# Patient Record
Sex: Male | Born: 1940 | Race: Black or African American | Hispanic: No | Marital: Married | Smoking: Former smoker
Health system: Southern US, Community
[De-identification: ages and names within clinical notes are randomized; demographics above are authoritative.]

## PROBLEM LIST (undated history)

## (undated) ENCOUNTER — Emergency Department (HOSPITAL_COMMUNITY): Payer: No Typology Code available for payment source

## (undated) DIAGNOSIS — K219 Gastro-esophageal reflux disease without esophagitis: Secondary | ICD-10-CM

## (undated) DIAGNOSIS — N189 Chronic kidney disease, unspecified: Secondary | ICD-10-CM

## (undated) DIAGNOSIS — R0600 Dyspnea, unspecified: Secondary | ICD-10-CM

## (undated) DIAGNOSIS — Z716 Tobacco abuse counseling: Secondary | ICD-10-CM

## (undated) DIAGNOSIS — Z95811 Presence of heart assist device: Secondary | ICD-10-CM

## (undated) DIAGNOSIS — I2510 Atherosclerotic heart disease of native coronary artery without angina pectoris: Secondary | ICD-10-CM

## (undated) DIAGNOSIS — E669 Obesity, unspecified: Secondary | ICD-10-CM

## (undated) DIAGNOSIS — C61 Malignant neoplasm of prostate: Secondary | ICD-10-CM

## (undated) DIAGNOSIS — I10 Essential (primary) hypertension: Secondary | ICD-10-CM

## (undated) HISTORY — PX: INSERTION OF BRONCHIAL STENT: SHX5092

## (undated) HISTORY — PX: PENILE PROSTHESIS IMPLANT: SHX240

## (undated) HISTORY — DX: Obesity, unspecified: E66.9

## (undated) HISTORY — DX: Gastro-esophageal reflux disease without esophagitis: K21.9

## (undated) HISTORY — DX: Chronic kidney disease, unspecified: N18.9

## (undated) HISTORY — DX: Essential (primary) hypertension: I10

## (undated) HISTORY — DX: Atherosclerotic heart disease of native coronary artery without angina pectoris: I25.10

## (undated) HISTORY — DX: Tobacco abuse counseling: Z71.6

---

## 2002-02-08 ENCOUNTER — Ambulatory Visit (HOSPITAL_COMMUNITY): Admission: RE | Admit: 2002-02-08 | Discharge: 2002-02-08 | Payer: Self-pay | Admitting: Cardiovascular Disease

## 2002-02-08 HISTORY — PX: CARDIAC CATHETERIZATION: SHX172

## 2004-01-19 DIAGNOSIS — B182 Chronic viral hepatitis C: Secondary | ICD-10-CM | POA: Diagnosis present

## 2004-01-19 DIAGNOSIS — F1010 Alcohol abuse, uncomplicated: Secondary | ICD-10-CM | POA: Diagnosis present

## 2010-11-20 ENCOUNTER — Inpatient Hospital Stay (HOSPITAL_COMMUNITY)
Admission: EM | Admit: 2010-11-20 | Discharge: 2010-11-22 | DRG: 247 | Disposition: A | Discharge status: Home / Self Care | Payer: Medicare Other | Source: Ambulatory Visit | Attending: Cardiovascular Disease | Admitting: Cardiovascular Disease

## 2010-11-20 ENCOUNTER — Inpatient Hospital Stay (HOSPITAL_COMMUNITY): Payer: Medicare Other

## 2010-11-20 DIAGNOSIS — I447 Left bundle-branch block, unspecified: Secondary | ICD-10-CM | POA: Diagnosis present

## 2010-11-20 DIAGNOSIS — I2510 Atherosclerotic heart disease of native coronary artery without angina pectoris: Secondary | ICD-10-CM

## 2010-11-20 DIAGNOSIS — N289 Disorder of kidney and ureter, unspecified: Secondary | ICD-10-CM | POA: Diagnosis present

## 2010-11-20 DIAGNOSIS — M109 Gout, unspecified: Secondary | ICD-10-CM | POA: Diagnosis present

## 2010-11-20 DIAGNOSIS — E669 Obesity, unspecified: Secondary | ICD-10-CM | POA: Diagnosis present

## 2010-11-20 DIAGNOSIS — I10 Essential (primary) hypertension: Secondary | ICD-10-CM | POA: Diagnosis present

## 2010-11-20 DIAGNOSIS — E119 Type 2 diabetes mellitus without complications: Secondary | ICD-10-CM | POA: Diagnosis present

## 2010-11-20 DIAGNOSIS — K219 Gastro-esophageal reflux disease without esophagitis: Secondary | ICD-10-CM | POA: Diagnosis present

## 2010-11-20 DIAGNOSIS — F17200 Nicotine dependence, unspecified, uncomplicated: Secondary | ICD-10-CM | POA: Diagnosis present

## 2010-11-20 HISTORY — PX: CARDIAC CATHETERIZATION: SHX172

## 2010-11-20 LAB — COMPREHENSIVE METABOLIC PANEL
AST: 44 U/L — ABNORMAL HIGH (ref 0–37)
Albumin: 3.2 g/dL — ABNORMAL LOW (ref 3.5–5.2)
Alkaline Phosphatase: 51 U/L (ref 39–117)
Chloride: 106 mEq/L (ref 96–112)
Creatinine, Ser: 1.83 mg/dL — ABNORMAL HIGH (ref 0.4–1.5)
GFR calc Af Amer: 45 mL/min — ABNORMAL LOW (ref >60)
Potassium: 3.6 mEq/L (ref 3.5–5.1)
Total Bilirubin: 0.6 mg/dL (ref 0.3–1.2)
Total Protein: 7.2 g/dL (ref 6.0–8.3)

## 2010-11-20 LAB — DIFFERENTIAL
Basophils Relative: 0 % (ref 0–1)
Lymphs Abs: 1.2 10*3/uL (ref 0.7–4.0)
Monocytes Absolute: 0.4 10*3/uL (ref 0.1–1.0)
Monocytes Relative: 9 % (ref 3–12)
Neutro Abs: 2.5 10*3/uL (ref 1.7–7.7)

## 2010-11-20 LAB — MRSA PCR SCREENING: MRSA by PCR: NEGATIVE

## 2010-11-20 LAB — CBC
Hemoglobin: 12.1 g/dL — ABNORMAL LOW (ref 13.0–17.0)
Hemoglobin: 12.2 g/dL — ABNORMAL LOW (ref 13.0–17.0)
MCH: 28.9 pg (ref 26.0–34.0)
MCH: 30 pg (ref 26.0–34.0)
MCHC: 34.9 g/dL (ref 30.0–36.0)
MCV: 84.4 fL (ref 78.0–100.0)
MCV: 86 fL (ref 78.0–100.0)
RBC: 4.18 MIL/uL — ABNORMAL LOW (ref 4.22–5.81)
RBC: 4.07 MIL/uL — ABNORMAL LOW (ref 4.22–5.81)

## 2010-11-20 LAB — BRAIN NATRIURETIC PEPTIDE: Pro B Natriuretic peptide (BNP): 45 pg/mL (ref 0.0–100.0)

## 2010-11-20 LAB — POCT I-STAT, CHEM 8
BUN: 16 mg/dL (ref 6–23)
Chloride: 102 mEq/L (ref 96–112)
Creatinine, Ser: 1.8 mg/dL — ABNORMAL HIGH (ref 0.4–1.5)
Glucose, Bld: 103 mg/dL — ABNORMAL HIGH (ref 70–99)
Potassium: 3.5 mEq/L (ref 3.5–5.1)

## 2010-11-20 LAB — GLUCOSE, CAPILLARY
Glucose-Capillary: 101 mg/dL — ABNORMAL HIGH (ref 70–99)
Glucose-Capillary: 64 mg/dL — ABNORMAL LOW (ref 70–99)

## 2010-11-20 LAB — CARDIAC PANEL(CRET KIN+CKTOT+MB+TROPI)
CK, MB: 5.1 ng/mL — ABNORMAL HIGH (ref 0.3–4.0)
Relative Index: 1.9 (ref 0.0–2.5)
Total CK: 239 U/L — ABNORMAL HIGH (ref 7–232)
Troponin I: 0.03 ng/mL (ref 0.00–0.06)

## 2010-11-20 LAB — PROTIME-INR: Prothrombin Time: 16.5 seconds — ABNORMAL HIGH (ref 11.6–15.2)

## 2010-11-21 DIAGNOSIS — R079 Chest pain, unspecified: Secondary | ICD-10-CM

## 2010-11-21 LAB — LIPID PANEL
Cholesterol: 170 mg/dL (ref 0–200)
LDL Cholesterol: 97 mg/dL (ref 0–99)
Total CHOL/HDL Ratio: 4.4 RATIO
VLDL: 34 mg/dL (ref 0–40)

## 2010-11-21 LAB — CARDIAC PANEL(CRET KIN+CKTOT+MB+TROPI)
CK, MB: 3.2 ng/mL (ref 0.3–4.0)
Total CK: 177 U/L (ref 7–232)
Troponin I: 0.02 ng/mL (ref 0.00–0.06)

## 2010-11-21 LAB — CBC
HCT: 34.6 % — ABNORMAL LOW (ref 39.0–52.0)
Hemoglobin: 12.1 g/dL — ABNORMAL LOW (ref 13.0–17.0)
MCH: 29.7 pg (ref 26.0–34.0)
MCHC: 35 g/dL (ref 30.0–36.0)
MCV: 85 fL (ref 78.0–100.0)
RBC: 4.07 MIL/uL — ABNORMAL LOW (ref 4.22–5.81)

## 2010-11-21 LAB — BASIC METABOLIC PANEL
CO2: 29 mEq/L (ref 19–32)
Calcium: 8.6 mg/dL (ref 8.4–10.5)
Chloride: 105 mEq/L (ref 96–112)
Creatinine, Ser: 1.23 mg/dL (ref 0.4–1.5)
GFR calc Af Amer: >60 mL/min (ref >60)
Glucose, Bld: 138 mg/dL — ABNORMAL HIGH (ref 70–99)

## 2010-11-21 LAB — TSH: TSH: 1.616 u[IU]/mL (ref 0.350–4.500)

## 2010-11-21 LAB — GLUCOSE, CAPILLARY: Glucose-Capillary: 132 mg/dL — ABNORMAL HIGH (ref 70–99)

## 2010-11-22 LAB — BASIC METABOLIC PANEL
BUN: 13 mg/dL (ref 6–23)
CO2: 27 mEq/L (ref 19–32)
Chloride: 106 mEq/L (ref 96–112)
Glucose, Bld: 95 mg/dL (ref 70–99)
Potassium: 3.6 mEq/L (ref 3.5–5.1)

## 2010-11-22 LAB — GLUCOSE, CAPILLARY: Glucose-Capillary: 125 mg/dL — ABNORMAL HIGH (ref 70–99)

## 2010-11-26 NOTE — Discharge Summary (Addendum)
[name], [name] NO.:  [id number]  MEDICAL RECORD NO.:  [id number]           PATIENT TYPE:  I  LOCATION:  [id number]                         FACILITY:  [facility]  PHYSICIAN:  Thayer Headings, M.D. DATE OF BIRTH:  1941-07-02  DATE OF ADMISSION:  11/20/2010 DATE OF DISCHARGE:  11/22/2010                              DISCHARGE SUMMARY   DISCHARGE DIAGNOSES: 1. Chest pain with new left bundle-branch block, initially admitted     as "Code STEMI" but ruled out for myocardial infarction. 2. Coronary artery disease.     a.     Moderate disease by cath in 2003 without intervention.     b.     New left bundle-branch block initially activated as code ST      elevation myocardial infarction, with negative cardiac enzymes and      cardiac cath on November 20, 2010, demonstrating severe mid left      anterior descending stenosis treated with percutaneous coronary      intervention under the guidance of IVUS with drug-eluting stent      placement.  Severe diffuse distal diagonal branch stenosis and      nonobstructive right coronary artery disease and left circumflex      stenosis. 3. Hypertension. 4. Type 2 non-insulin-dependent diabetes. 5. Acute renal insufficiency with creatinine of 1.8 on admission,     discharge creatinine 1.13. 6. Tobacco abuse, counseled. 7. Obesity. 8. Gout. 9. Gastroesophageal reflux disease.  HOSPITAL COURSE:  [name] is a 70 year old gentleman with a prior history of coronary artery disease in 2003, diabetes, hypertension, ongoing tobacco abuse who presented to the [facility] with a code STEMI.  While [facility] was at the senior center, he complained of chest pain, ongoing for 2 days.  A nurse called to EMS.  His EKG was abnormal and he was diagnosed with a left bundle-branch block, which was not previously known.  In the ER and on en route to the hospital, he received aspirin 81 mg x4 sublingual nitro and morphine; and on prior arrival to  the cath lab, he was still having 8/10 chest pain.  Cardiac catheterization was performed by Dr. Burt Knack, which demonstrated severe mid LAD stenosis treated with PCI under the guidance of IVUS.  Drug- eluting stent platform was used to reduce stenosis to 0%.  He had severe diffuse diagonal branch stenosis with nonobstructive RCA and left circumflex stenosis.  Postprocedurally, cardiac enzymes were drawn, which were negative x3.  His cholesterol did note his LDL was 97 with an HDL of 39 and triglycerides of 169.  His HCTZ was held on admission secondary to renal insufficiency, which also improved as above.  The patient was counseled in regards to his tobacco abuse.  His blood pressure was elevated on the day of discharge in the 150 range; and therefore, HCTZ was added back with supplemental potassium per Dr. Acie Fredrickson.  He has seen and examined him today and feels he is stable for discharge.  DISCHARGE LABS:  WBC 3.8, hemoglobin 12.1, hematocrit 34.6, and platelet count 149.  Sodium 138, potassium 3.6. chloride 106, CO2 27,  glucose 95, BUN 13, creatinine 1.13.  A1c is 6.0, total cholesterol 169, HDL 39, LDL 97, TSH 1.616.  STUDIES: 1. Chest x-ray on November 20, 2010, showed no evidence of acute     cardiopulmonary disease. 2. Cardiac catheterization on November 20, 2010, please see full     report for details as well as HPI for summary.  DISCHARGE MEDICATIONS: 1. Aspirin 81 mg daily. 2. Coreg 3.125 mg b.i.d. 3. Lipitor 40 mg at bedtime, in place of the Zocor. 4. Nitroglycerin sublingual 0.4 mg every 5 minutes as needed up to 3     doses. 5. Plavix 75 mg daily. 6. Potassium chloride 10 mEq daily. 7. Protonix 40 mg daily. 8. Hydrochlorothiazide 25 mg daily. 9. Acetaminophen 325 mg 2 tablets daily. 10.Allopurinol 100 mg daily. 11.Amlodipine 10 mg daily. 12.Glipizide 10 mg b.i.d. 13.Terazosin 5 mg daily.  DISPOSITION:  [name] will be discharged in stable condition to home. He is  not to lift anything or participate in sexual activity for 1 week. He is not to drive for 2 days.  He is to follow a low-sodium, heart- healthy diabetic diet and to call or return if he notices any pain, swelling, bleeding, or pus at his cath site.  He will follow up with Dr. Elmarie Shiley office on December 06, 2010, at 9:15 a.m. with Truitt Merle, nurse practitioner.  He will also have a BMET at that visit, given the reinitiation of HCTZ in the setting of his acute renal insufficiency on admission with potassium supplementation.  DURATION OF DISCHARGE ENCOUNTER:  Greater than 30 minutes including physician and PA time.     Melina Copa, P.A.C.   ______________________________ Thayer Headings, M.D.    DD/MEDQ  D:  11/22/2010  T:  11/22/2010  Job:  [id number]  cc:   Dr. Bunnie Philips at [facility].  Electronically Signed by Melina Copa  on 11/26/2010 05:14:30 PM Electronically Signed by Mertie Moores M.D. on 11/28/2010 07:03:17 AM

## 2010-11-29 ENCOUNTER — Ambulatory Visit (INDEPENDENT_AMBULATORY_CARE_PROVIDER_SITE_OTHER): Payer: Medicare Other | Admitting: Nurse Practitioner

## 2010-11-29 DIAGNOSIS — I10 Essential (primary) hypertension: Secondary | ICD-10-CM

## 2010-11-29 DIAGNOSIS — Z87891 Personal history of nicotine dependence: Secondary | ICD-10-CM

## 2010-11-29 DIAGNOSIS — I2510 Atherosclerotic heart disease of native coronary artery without angina pectoris: Secondary | ICD-10-CM

## 2010-11-29 NOTE — Procedures (Signed)
NAMEWING, Bernard NO.:  [id number]  MEDICAL RECORD NO.:  [id number]           PATIENT TYPE:  I  LOCATION:  2926                         FACILITY:  [facility]  PHYSICIAN:  Juanda Bond. Burt Knack, MD  DATE OF BIRTH:  1940-12-21  DATE OF PROCEDURE:  11/20/2010 DATE OF DISCHARGE:                           CARDIAC CATHETERIZATION   PROCEDURES: 1. Left heart catheterization. 2. Selective coronary angiography. 3. Left ventricular angiography. 4. Intravascular ultrasound and stenting of the mid-left anterior     descending artery.  PROCEDURAL INDICATIONS:  Mr. Gaugh is a 70 year old African American male with moderate coronary artery disease from a prior catheterization in 2003.  He presented from [facility] with chest pain and new left bundle-branch block.  A Code STEMI was activated from the field.  Upon arrival, the patient described 8/10 chest pain, but he appeared fairly comfortable in any event, with ongoing discomfort and left bundle branch block on EKG.  We proceeded emergently with cardiac cath and planned PCI if needed.  Risks and indications of the procedure were reviewed with the patient, emergency consent was obtained.  The right groin was prepped, draped and anesthetized with 1% lidocaine.  Using the modified Seldinger technique, a 6-French sheath was placed in the right femoral artery.  Standard Judkins catheters were used for coronary angiography and left ventriculography.  Following the diagnostic procedure, I elected to proceed with intravascular ultrasound of the LAD.  DIAGNOSTIC FINDINGS:  Aortic pressure 113/71 with a mean of 89, left ventricular pressure 114/15.  CORONARY ANGIOGRAPHY:  The right coronary artery is patent.  There is no high-grade obstructive disease.  There is mild plaque through the proximal vessel with 30-40% proximal-to-mid stenosis.  The distal vessel is patent with mild ectasia and luminal irregularities.  There is  an ectatic, diffusely diseased PDA branch.  The posterolateral branches of the RCA are small.  Left coronary artery:  The left mainstem has 25% ostial narrowing.  The left main is large and divides into the LAD and left circumflex.  There is no significant left main obstruction. LAD:  The LAD has moderate calcification.  There is a large first diagonal with severe distal disease in the 80-90% range, the coronary artery disease is in a diabetic pattern with distal vessel predominance. The second diagonal is small and it has high-grade stenosis of 80%.  The LAD has diffuse calcification.  There is a focal area of hypodensity in the mid-LAD at the origin of the second septal perforator.  There is scattered plaque throughout the remaining portions of the mid-LAD and then the distal LAD is of large caliber and wraps around the left ventricular apex.  The left circumflex is patent throughout its course gives off two obtuse marginal branches with no significant obstructive disease. Left ventriculography:  There is mild hypokinesis of the anterolateral wall.  The LVEF is estimated at 50-55%.  PCI NOTE:  There was focal hypodensity of the mid-LAD.  The diagonal branch has diffuse disease, but it is in the distal vessel pattern and I think it is best left from medical management.  I decided to proceed with  intravascular ultrasound of the LAD to assess the area of hypodensity as the patient has presented with symptoms typical of acute coronary syndrome.  Intravascular ultrasound was performed after a therapeutic ACT was achieved using IV bivalirudin.  The patient received study drug according to the [facility] trial.  Intravascular ultrasound showed diffuse plaque throughout the mid-LAD, but there was an area of focal high-grade stenosis that corresponded to the area of hypodensity.  There was also an appearance of a small tissue flap that was suspicious for area of plaque rupture.  There  was deep calcium, but minimal superficial calcium and I felt the vessel could be primarily stented.  A 3.0 x 20-mm Promus drug-eluting stent was chosen.  It was carefully positioned and deployed at 16 atmospheres.  The stent appeared well expanded.  The stent was postdilated with a 3.5 x 12-mm Dry Prong Quantum apex balloon which was taken to 16 atmospheres on two inflations.  There was an excellent angiographic result with no residual stenosis. Postprocedure IVUS was performed and demonstrated excellent stent expansion and an apposition throughout.  The patient tolerated the procedure well.  There were no immediate complications.  Of note, a venous sheath was placed at the beginning of the procedure as the patient had no good peripheral IV access.  Both the venous and arterial sheath will be pulled after the bivalirudin has been off for 2 hours.  FINAL ASSESSMENT: 1. Severe mid-left anterior descending artery stenosis, treated     successfully with percutaneous coronary intervention under the     guidance of intravascular ultrasound.  A drug-eluting stent     platform was used to reduce 80% stenosis to 0% with TIMI 3 flow     both pre and post. 2. Severe diffuse distal diagonal branch stenosis. 3. Nonobstructive right coronary artery and left circumflex stenosis.  RECOMMENDATIONS:  The patient should continue on dual antiplatelet therapy with aspirin and Plavix for minimum of 12 months.  He will be treated medically for his residual coronary artery disease.     Juanda Bond. Burt Knack, MD     MDC/MEDQ  D:  11/20/2010  T:  11/21/2010  Job:  [id number]  Electronically Signed by Sherren Mocha MD on 11/27/2010 09:11:07 PM

## 2010-11-29 NOTE — H&P (Signed)
[name], Bernard [name] NO.:  [id number]  MEDICAL RECORD NO.:  [id number]           PATIENT TYPE:  I  LOCATION:  2926                         FACILITY:  [facility]  PHYSICIAN:  Juanda Bond. Burt Knack, MD  DATE OF BIRTH:  1941-08-10  DATE OF ADMISSION:  11/20/2010 DATE OF DISCHARGE:                             HISTORY & PHYSICAL   PRIMARY CARE PHYSICIAN:  Dr. Bunnie Philips at [facility].  PRIMARY CARDIOLOGIST:  Thayer Headings, MD, in 2003.  CHIEF COMPLAINT:  STEMI/chest [name].  HISTORY OF PRESENT ILLNESS:  Bernard [name] is a 70 year old male with a history of nonobstructive coronary artery disease by remote cath.  Two days ago, Bernard [name] had onset of substernal chest [name].  Bernard [name] would get some temporary decrease with antacids.  Bernard [name] was using a mixture of baking soda and water.  Bernard [name] took all of his usual home medications, but no other medications for the chest [name].  It was between an 8-10/10.  Bernard [name] had ongoing [name] for 2 days.  Today while Bernard [name] was at the [facility], Bernard [name] told an RN about the [name] and she called EMS.  Initially, Bernard [name] was reluctant to be transported, but Bernard [name] was having ongoing [name] and his EKG was abnormal, so Bernard [name] was transported emergently to [facility] and an STEMI was called.  Bernard [name] is substernal.  Bernard [name] has some radiation into his upper chest and shoulder.  Bernard [name] had some dyspnea on exertion with it and shortness of breath.  Bernard [name] had no nausea, vomiting or diaphoresis.  Bernard [name] had some indigestion that Bernard [name] thought was his usual reflux symptoms.  Bernard [name] has never had [name] like this before.  En route to the hospital, Bernard [name] received aspirin 81 mg x4, sublingual nitroglycerin x2, and morphine 4 mg IV. Upon arrival to the cath lab, his [name] is still an 8/10.  PAST MEDICAL HISTORY: 1. Status post cardiac catheterization in 2003 because of an abnormal     stress test and decreased left ventricular systolic function.  Bernard [name]     had a mid LAD 50% stenosis that did not appear to be flow limiting,     and no other significant coronary artery disease, EF 65%. 2. Diabetes. 3. Hypertension. 4. Ongoing tobacco use. 5. Obesity. 6. Gout. 7. Gastroesophageal reflux disease.  SURGICAL HISTORY:  Bernard [name] is status post cardiac catheterization and has a midline scar in his lower abdomen, procedure unknown.  ALLERGIES:  Bernard [name] is allergic or intolerant to LATEX, and per the patient ALPHA-BLOCKERS.  CURRENT MEDICATIONS:  Per the patient's med list from the [place] dated August 2011. 1. Tylenol 325 mg p.r.n. 2. Allopurinol 100 mg a day. 3. Glipizide 10 mg b.i.d. 4. HCTZ 25 mg a day. 5. Isordil 10 mg t.i.d. 6. Omeprazole 20 mg daily. 7. Zocor 80 mg 1/2 tablet daily. 8. Terazosin 5 mg daily. 9. Amlodipine 10 mg daily.  SOCIAL HISTORY:  Bernard [name] lives in [place] with family nearby.  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  Bernard [name] has a greater than 30 pack-year history of ongoing tobacco use, but denies alcohol and drug abuse.  FAMILY HISTORY:  Not available secondary to the urgency of the situation.  REVIEW OF SYSTEMS:  Bernard [name] that Bernard [name] had an admission to the hospital for heart failure remotely.  Bernard [name] [name] Bernard [name] had lower extremity edema, was admitted to the [place] and his symptoms improved.  The timing of this is unclear.  Bernard [name] has occasional arthralgias.  Bernard [name] has occasional reflux symptoms, but denies melena.  Review of systems is otherwise negative.  PHYSICAL EXAMINATION:  GENERAL:  Bernard [name] is a well-developed obese African American male in no acute distress. HEENT:  Normal. NECK:  There is no lymphadenopathy, thyromegaly, bruit or JVD noted. CV:  His heart is regular in rate and rhythm with an S1 and S2, and no significant murmur, rub, or gallop is noted.  Distal pulses are intact in all 4 extremities and no femoral bruits are appreciated. LUNGS:  Clear to auscultation bilaterally. SKIN:  No rashes or lesions are noted. ABDOMEN:  Soft and nontender with active bowel sounds. EXTREMITIES:  There is no  cyanosis, clubbing, or edema noted. MUSCULOSKELETAL:  There is no joint deformity or effusions, and no spine or CVA tenderness. NEUROLOGIC:  Bernard [name] is alert and oriented.  Cranial nerves II-XII grossly intact.  Chest x-ray and labs are pending.  EKG is sinus rhythm, rate 85, with a left bundle-branch block that we believe to be new.  IMPRESSION:  Bernard [name], Bernard [name].  Bernard [name] is a 70 year old male with multiple cardiac risk factors who was having ongoing chest [name] and abnormal EKG.  Bernard [name] has a history of nonobstructive coronary artery disease 9 years ago.  Emergent cardiac catheterization is indicated, and the risks and benefits of the procedure were discussed with the patient who agrees to proceed.  Further evaluation and treatment will depend on the results and we will screen for cardiac risk factors.     Rosaria Ferries, PA-C   ______________________________ Juanda Bond. Burt Knack, MD    RB/MEDQ  D:  11/20/2010  T:  11/21/2010  Job:  [id number]  Electronically Signed by Rosaria Ferries PA-C on 11/27/2010 06:44:38 AM Electronically Signed by Sherren Mocha MD on 11/27/2010 09:11:11 PM

## 2010-12-06 ENCOUNTER — Ambulatory Visit: Payer: Medicare Other | Admitting: Nurse Practitioner

## 2010-12-06 ENCOUNTER — Ambulatory Visit (INDEPENDENT_AMBULATORY_CARE_PROVIDER_SITE_OTHER): Payer: Medicare Other | Admitting: Nurse Practitioner

## 2010-12-06 DIAGNOSIS — I252 Old myocardial infarction: Secondary | ICD-10-CM

## 2010-12-06 DIAGNOSIS — Z87891 Personal history of nicotine dependence: Secondary | ICD-10-CM

## 2010-12-06 DIAGNOSIS — I2510 Atherosclerotic heart disease of native coronary artery without angina pectoris: Secondary | ICD-10-CM

## 2010-12-13 ENCOUNTER — Inpatient Hospital Stay (HOSPITAL_COMMUNITY)
Admission: EM | Admit: 2010-12-13 | Discharge: 2010-12-14 | DRG: 313 | Disposition: A | Discharge status: Home / Self Care | Payer: Medicare Other | Attending: Cardiology | Admitting: Cardiology

## 2010-12-13 ENCOUNTER — Emergency Department (HOSPITAL_COMMUNITY): Payer: Medicare Other

## 2010-12-13 DIAGNOSIS — R079 Chest pain, unspecified: Secondary | ICD-10-CM

## 2010-12-13 LAB — DIFFERENTIAL
Basophils Absolute: 0 10*3/uL (ref 0.0–0.1)
Basophils Relative: 0 % (ref 0–1)
Eosinophils Relative: 4 % (ref 0–5)
Lymphocytes Relative: 34 % (ref 12–46)
Neutro Abs: 2.7 10*3/uL (ref 1.7–7.7)

## 2010-12-13 LAB — CBC
HCT: 35.4 % — ABNORMAL LOW (ref 39.0–52.0)
Hemoglobin: 12.3 g/dL — ABNORMAL LOW (ref 13.0–17.0)
RDW: 13.8 % (ref 11.5–15.5)
WBC: 5.2 10*3/uL (ref 4.0–10.5)

## 2010-12-13 LAB — POCT CARDIAC MARKERS
CKMB, poc: 1.9 ng/mL (ref 1.0–8.0)
Myoglobin, poc: 149 ng/mL (ref 12–200)

## 2010-12-13 LAB — CARDIAC PANEL(CRET KIN+CKTOT+MB+TROPI): Total CK: 239 U/L — ABNORMAL HIGH (ref 7–232)

## 2010-12-14 DIAGNOSIS — I2510 Atherosclerotic heart disease of native coronary artery without angina pectoris: Secondary | ICD-10-CM

## 2010-12-14 DIAGNOSIS — E119 Type 2 diabetes mellitus without complications: Secondary | ICD-10-CM | POA: Diagnosis present

## 2010-12-14 DIAGNOSIS — R0789 Other chest pain: Principal | ICD-10-CM | POA: Diagnosis present

## 2010-12-14 DIAGNOSIS — Z79899 Other long term (current) drug therapy: Secondary | ICD-10-CM

## 2010-12-14 DIAGNOSIS — Z9861 Coronary angioplasty status: Secondary | ICD-10-CM

## 2010-12-14 DIAGNOSIS — Z7982 Long term (current) use of aspirin: Secondary | ICD-10-CM

## 2010-12-14 DIAGNOSIS — Z7902 Long term (current) use of antithrombotics/antiplatelets: Secondary | ICD-10-CM

## 2010-12-14 DIAGNOSIS — Z91040 Latex allergy status: Secondary | ICD-10-CM

## 2010-12-14 DIAGNOSIS — N189 Chronic kidney disease, unspecified: Secondary | ICD-10-CM | POA: Diagnosis present

## 2010-12-14 DIAGNOSIS — F17200 Nicotine dependence, unspecified, uncomplicated: Secondary | ICD-10-CM | POA: Diagnosis present

## 2010-12-14 DIAGNOSIS — I129 Hypertensive chronic kidney disease with stage 1 through stage 4 chronic kidney disease, or unspecified chronic kidney disease: Secondary | ICD-10-CM | POA: Diagnosis present

## 2010-12-14 DIAGNOSIS — I447 Left bundle-branch block, unspecified: Secondary | ICD-10-CM | POA: Diagnosis present

## 2010-12-14 LAB — BASIC METABOLIC PANEL
BUN: 22 mg/dL (ref 6–23)
Chloride: 106 mEq/L (ref 96–112)
GFR calc non Af Amer: 53 mL/min — ABNORMAL LOW (ref >60)
Potassium: 3.7 mEq/L (ref 3.5–5.1)
Sodium: 139 mEq/L (ref 135–145)

## 2010-12-14 LAB — PROTIME-INR
INR: 1 (ref 0.00–1.49)
Prothrombin Time: 13.4 seconds (ref 11.6–15.2)

## 2010-12-14 LAB — CARDIAC PANEL(CRET KIN+CKTOT+MB+TROPI)
CK, MB: 4.4 ng/mL — ABNORMAL HIGH (ref 0.3–4.0)
Relative Index: 1.9 (ref 0.0–2.5)
Total CK: 232 U/L (ref 7–232)
Troponin I: 0.02 ng/mL (ref 0.00–0.06)

## 2010-12-14 LAB — GLUCOSE, CAPILLARY: Glucose-Capillary: 58 mg/dL — ABNORMAL LOW (ref 70–99)

## 2010-12-26 NOTE — Discharge Summary (Signed)
[name], [name] NO.:  [id number]  MEDICAL RECORD NO.:  [id number]           PATIENT TYPE:  O  LOCATION:  2019                         FACILITY:  [place]  PHYSICIAN:  Marijo Conception. Candise Crabtree, MD, FACCDATE OF BIRTH:  [birth year]  DATE OF ADMISSION:  12/13/2010 DATE OF DISCHARGE:  12/14/2010                              DISCHARGE SUMMARY   PRIMARY CARDIOLOGIST:  Thayer Headings, MD  PRIMARY CARE PROVIDER:  Dr. Bunnie Philips at [facility].  DISCHARGE DIAGNOSES: 1. Coronary artery disease.     a.     Cardiac catheterization on November 21, 2010:  Mid LAD      stenosis of 80% with subsequent drug-eluting stent placed in the      mid LAD with ultrasound guidance.  There is residual 80-90%      stenosis in the large first diagonal, circumflex, and RCA with at      most 30-40% stenosis.     b.     Normal left ventricular systolic function. 2. Left bundle-branch block. 3. Hypertension. 4. Non-insulin-dependent diabetes mellitus. 5. Ongoing tobacco abuse. 6. Chronic renal insufficiency disease, baseline creatinine 1.2-1.8.  ALLERGIES: 1. ALPHA-BLOCKERS except for terazosin causing a rash. 2. LATEX.  PROCEDURE/DIAGNOSTICS PERFORMED DURING HOSPITALIZATION:  Chest x-ray on December 13, 2010:  Cardiomegaly without congestive heart failure or pneumonia.  REASON FOR HOSPITALIZATION:  This is a 70 year old gentleman with recent percutaneous coronary intervention of the LAD on November 21, 2010, as well as the above-stated medical history who states he has been in his usual state of health since discharge on November 23, 2010, until several days ago.  At this time, he noticed left-sided chest discomfort that sometimes radiates to his left shoulder.  This pain lasts approximately 2-3 minutes, and resolves on its own, he had no associated symptoms. The pain is brought on by certain positions, that is also alleviated by changing position.  The patient's EKG was unchanged from prior  tracings. Initial point-of-care markers were negative.  The patient was admitted for overnight observation to rule out for myocardial infarction.  HOSPITAL COURSE:  The patient was monitored on telemetry and ruled out for myocardial infarction with cardiac enzymes being negative x3. Repeat EKG on morning of discharge showed no acute changes.  The patient states he is much improved.  He was ambulating without difficulty.  With the patient ruling out for myocardial infarction and patient's chest pain atypical, Dr. Acie Fredrickson, felt the patient was stable to home.  No further ischemic evaluation is warranted at this time.  He will be continued on his home medications.  The patient currently smokes approximately 3 cigars a day and tobacco cessation has been strongly encouraged, he voices understanding.  The patient will be discharged on aspirin, Coreg, Lipitor, and Plavix for his coronary artery disease.  DISCHARGE LABORATORY DATA:  Sodium 139, potassium 3.7, BUN 12, creatinine 1.33.  DISCHARGE MEDICATIONS: 1. Acetaminophen 325 mg 2 tablets daily. 2. Allopurinol 100 mg daily. 3. Amlodipine 10 mg daily. 4. Aspirin enteric-coated 81 mg daily. 5. Coreg 3.125 mg twice daily. 6. Glipizide 10 mg twice daily. 7. Hydrochlorothiazide 25 mg  daily. 8. Lipitor 40 mg daily. 9. Nitroglycerin 0.4 mg tablets sublingual, 1 tablet under tongue     every 5 minutes as needed for up to 3 doses. 10.Plavix 75 mg daily. 11.Potassium chloride 10 mEq daily. 12.Protonix 40 mg daily. 13.Terazosin 5 mg daily.  FOLLOWUP PLANS AND INSTRUCTIONS: 1. The patient will follow up with Dr. Acie Fredrickson on January 03, 2011, at 9     a.m. 2. The patient is to increase activity as tolerated. 3. The patient is to continue a low-sodium, heart-healthy diabetic     diet. 4. The patient is to stop any activities that causes chest pain or     shortness of breath. 5. The patient is to call the office in the interim if there is any      problems or concerns.   Duration of discharge: Greater than 30 minutes with physician and physician  extender time.  Cecille Amsterdam, PA-C   ______________________________ [name], MD, [facility]    NB/MEDQ  D:  12/14/2010  T:  12/15/2010  Job:  [id number]  cc:   Thayer Headings, M.D. Dr. Bunnie Philips  Electronically Signed by Pennie Rushing P.A. on 12/21/2010 [id number] PM Electronically Signed by Jenell Milliner MD [facility] on 12/26/2010 01:01:34 PM

## 2011-01-03 ENCOUNTER — Ambulatory Visit: Payer: Medicare Other | Admitting: Cardiovascular Disease

## 2011-01-09 NOTE — H&P (Signed)
NAMECAILAN, STUDENT NO.:  [id number]  MEDICAL RECORD NO.:  [id number]           PATIENT TYPE:  O  LOCATION:  2019                         FACILITY:  [facility]  PHYSICIAN:  Marijo Conception. Jaice Digioia, MD, FACCDATE OF BIRTH:  Apr 21, 1941  DATE OF ADMISSION:  12/13/2010 DATE OF DISCHARGE:                             HISTORY & PHYSICAL   PRIMARY CARDIOLOGIST:  Thayer Headings, MD  CHIEF COMPLAINT:  Chest pain.  HISTORY OF PRESENT ILLNESS:  [name] is a 70 year old African American male with a known history of coronary artery disease having had a cardiac catheterization after a code STEMI was called and then called off on November 21, 2010, but cardiac catheterization revealed mid LAD stenosis of 80% with subsequent DES (3.0 x 20 mm Promus) placement by an ultrasound guidance with reduction from 80 to 0% but known residual 80- 90% in the large diagonal as well as moderate nonobstructive disease in circumflex right coronary artery, normal LV function at that time.  The patient had a new left bundle branch at that time as well as history significant for hypertension, NIDDM, and minimal but ongoing tobacco abuse as well as chronic renal insufficiency with creatinine of 1.2 to 1.8 who now presents with atypical symptoms but similar to his prior symptoms last month.  The patient has been in his usual state of health until the last several days when he has noticed several episodes of left-sided chest discomfort sometimes with radiation to the left shoulder and up to left neck but he states it lasted about 2-3 minutes and no ecchymosis 01/08.  They are clearly positional and are not exertional.  They are nonpleuritic nor they are associated with food.  He has no associated symptoms.  The patient became concerned because he describes these symptoms as being very similar to the symptoms that led to his evaluation and subsequent cardiac catheterization on February 29.  He is adamant  that the symptoms not only are brought on by certain positions, but are alleviated by changing position.  Due to the concern, he presented to [facility] ED for further evaluation.  Here all EKG looks unchanged from prior tracing last month.  Vital signs significant only for mild hypertension and mild bradycardia.  Point-of-care markers are negative and CBC unremarkable. BMET currently pending.  Chest x-ray, cardiomegaly without CHF or pneumonia.  He denies any discomfort during his evaluation in the emergency department by Cardiology.  PAST MEDICAL HISTORY: 1. CAD.     a.     Cardiac catheterization November 21, 2010:  Mid LAD stenosis      of 80% with subsequent DES (3.0 x 20 mm Promus) placed in mid LAD      with ultrasound guidance.  Residual 80-90% stenosis in large first      diagonal, circumflex and RCA with at most 30-40% stenosis, normal      left ventricular systolic function. 2. Left bundle-branch block. 3. Hypertension. 4. NIDDM. 5. Ongoing tobacco abuse. 6. Chronic renal insufficiency (baseline creatinine 1.2-1.8).  SOCIAL HISTORY:  The patient lives in [place] with his family.  He has retired Nature conservation officer, has  30-pack-year smoking history but now only smokes 3 cigarettes a day.  No EtOH, illicit drug use, herbal meds, regular diet.  No regular exercise.  FAMILY HISTORY:  Negative for premature diagnosis of coronary artery disease.  REVIEW OF SYSTEMS:  Please see HPI.  All other systems reviewed and were negative.  CODE STATUS:  Full.  ALLERGIES: 1. ALPHA BLOCKERS except for terazosin. 2. LATEX.  MEDICATIONS: 1. Aspirin 81 mg p.o. daily. 2. Coreg 3.125 mg p.o. b.i.d. 3. Lipitor 40 mg p.o. nightly. 4. Sublingual nitroglycerin 0.4 mg p.r.n. 5. Plavix 75 mg p.o. daily. 6. Potassium chloride 10 mg p.o. daily. 7. Protonix 40 mg p.o. daily. 8. HCTZ 25 mg p.o. daily. 9. APAP 325 mg p.r.n. 10.Allopurinol 100 mg p.o. daily. 11.Amlodipine 10 mg p.o. daily. 12.Glipizide  10 mg p.o. b.i.d. 13.Terazosin 5 mg p.o. daily.  PHYSICAL EXAMINATION:  VITAL SIGNS:  Temperature 98.6 degree Fahrenheit with BP 133-142/80-82, pulse 54-57, respirations 18, O2 saturation 100% on 2 L by nasal cannula. GENERAL:  The patient is alert and oriented x3 in no apparent distress, able to speak easily in full sentences without respiratory distress. HEENT:  Head is normocephalic, atraumatic.  Pupils are equal, round and reactive to light.  Extraocular muscles are intact.  Nares without discharge.  Oropharynx without erythema or exudates. NECK:  Supple without lymphadenopathy.  No thyromegaly.  No JVD.  Heart rate is regular with audible S1 and S2.  No clicks, rubs, murmurs or gallops.  Pulses are 2+ and equal in both upper and lower extremities bilaterally. LUNGS:  Clear to auscultation bilaterally. SKIN:  No rashes, lesions or petechia. ABDOMEN:  Soft, nontender, nondistended.  Normal abdominal bowel sounds. No rebound or guarding.  No hepatosplenomegaly.  The patient is overweight. EXTREMITIES:  Without clubbing, cyanosis or edema. MUSCULOSKELETAL:  Without joint deformity or effusions.  No spinal or CVA tenderness. NEURO:  Cranial Nerves II through XII grossly intact.  Strength is 5/5 in all extremities and axial groups.  Normal sensation throughout, normal cerebellar function.  RADIOLOGY:  Cardiomegaly without CHF or pneumonia. EKG sinus bradycardia with left bundle-branch block, 53 bpm, inferolateral ST changes and Q-waves in V1 through V3.  No change from prior tracing including left axis deviation, PR 208, QRS 132, QTC 428.  LABORATORY FINDINGS:  WBC is 5.2, HGB 12.3, HCT 35.4, PLT count 146. Point-of-care markers negative x1, other labs are pending.  ASSESSMENT AND PLAN:  The patient initially seen by Liam Rogers, PAC and then seen by attending cardiologist, Dr. Jenell Milliner.  [name] is a 70 year old African American gentleman with a known history of coronary  artery disease having had a cardiac cath on November 21, 2010, resulting in drug-eluting stent placed to 80% stenosis in the mid LAD, but with residual 89% in the large diagonal, but no significant disease elsewhere, normal left ventricular function, also history significant for hypertension, NIDDM, ongoing tobacco abuse and chronic renal insufficiency who now presents with atypical chest discomfort but somewhat similar to prior symptoms leading to his PCI.  1. Chest pain - Atypical and without any objective evidence, favor     ruling out with 2 more full sets of cardiac enzymes, admit to     observation, likely dc in the a.m. with follow up at     [facility] as an outpatient to decide on whether further     ischemic eval is warranted.  Otherwise continue home medications.  1. Tobacco abuse -  The patient is currently only  smoking 3 cigarettes     per day but we will request tobacco cessation consult to help     patient quit altogether.  The patient is motivated and receptive to     the ideas of getting further help with tobacco cessation.     Guss Bunde, PAC   ______________________________ Marijo Conception. Verl Blalock, MD, [facility]    MS/MEDQ  D:  12/13/2010  T:  12/14/2010  Job:  [id number]  Electronically Signed by Guss Bunde PAC on 12/27/2010 03:00:12 PM Electronically Signed by Jenell Milliner MD [place] on 01/09/2011 08:42:21 AM

## 2011-02-05 ENCOUNTER — Encounter: Payer: Self-pay | Admitting: Cardiology

## 2011-02-05 ENCOUNTER — Ambulatory Visit: Payer: Medicare Other | Admitting: Nurse Practitioner

## 2011-02-05 DIAGNOSIS — N189 Chronic kidney disease, unspecified: Secondary | ICD-10-CM | POA: Insufficient documentation

## 2011-02-05 DIAGNOSIS — I10 Essential (primary) hypertension: Secondary | ICD-10-CM | POA: Insufficient documentation

## 2011-02-05 DIAGNOSIS — I2510 Atherosclerotic heart disease of native coronary artery without angina pectoris: Secondary | ICD-10-CM | POA: Insufficient documentation

## 2011-07-16 ENCOUNTER — Other Ambulatory Visit: Payer: Self-pay | Admitting: *Deleted

## 2011-07-16 MED ORDER — OLMESARTAN MEDOXOMIL 20 MG PO TABS: 20.0000 mg | ORAL_TABLET | Freq: Every day | ORAL | Status: DC

## 2011-07-23 ENCOUNTER — Other Ambulatory Visit: Payer: Self-pay | Admitting: *Deleted

## 2011-07-26 ENCOUNTER — Other Ambulatory Visit: Payer: Self-pay | Admitting: *Deleted

## 2011-08-12 ENCOUNTER — Telehealth: Payer: Self-pay

## 2011-08-16 ENCOUNTER — Other Ambulatory Visit: Payer: Self-pay | Admitting: *Deleted

## 2011-12-11 ENCOUNTER — Encounter: Payer: Self-pay | Admitting: *Deleted

## 2012-09-26 ENCOUNTER — Encounter (HOSPITAL_COMMUNITY): Payer: Self-pay | Admitting: Emergency Medicine

## 2012-09-26 ENCOUNTER — Emergency Department (HOSPITAL_COMMUNITY)
Admission: EM | Admit: 2012-09-26 | Discharge: 2012-09-27 | Disposition: A | Discharge status: Home Health | Payer: Medicare Other | Attending: Emergency Medicine | Admitting: Emergency Medicine

## 2012-09-26 DIAGNOSIS — S0181XA Laceration without foreign body of other part of head, initial encounter: Secondary | ICD-10-CM

## 2012-09-26 DIAGNOSIS — E119 Type 2 diabetes mellitus without complications: Secondary | ICD-10-CM | POA: Insufficient documentation

## 2012-09-26 DIAGNOSIS — I2510 Atherosclerotic heart disease of native coronary artery without angina pectoris: Secondary | ICD-10-CM | POA: Insufficient documentation

## 2012-09-26 DIAGNOSIS — F17200 Nicotine dependence, unspecified, uncomplicated: Secondary | ICD-10-CM | POA: Insufficient documentation

## 2012-09-26 DIAGNOSIS — Z79899 Other long term (current) drug therapy: Secondary | ICD-10-CM | POA: Insufficient documentation

## 2012-09-26 DIAGNOSIS — E669 Obesity, unspecified: Secondary | ICD-10-CM | POA: Insufficient documentation

## 2012-09-26 DIAGNOSIS — K219 Gastro-esophageal reflux disease without esophagitis: Secondary | ICD-10-CM | POA: Insufficient documentation

## 2012-09-26 DIAGNOSIS — M109 Gout, unspecified: Secondary | ICD-10-CM | POA: Insufficient documentation

## 2012-09-26 DIAGNOSIS — Z794 Long term (current) use of insulin: Secondary | ICD-10-CM | POA: Insufficient documentation

## 2012-09-26 DIAGNOSIS — S0180XA Unspecified open wound of other part of head, initial encounter: Secondary | ICD-10-CM | POA: Insufficient documentation

## 2012-09-26 DIAGNOSIS — N189 Chronic kidney disease, unspecified: Secondary | ICD-10-CM | POA: Insufficient documentation

## 2012-09-26 DIAGNOSIS — S0990XA Unspecified injury of head, initial encounter: Secondary | ICD-10-CM

## 2012-09-26 DIAGNOSIS — I129 Hypertensive chronic kidney disease with stage 1 through stage 4 chronic kidney disease, or unspecified chronic kidney disease: Secondary | ICD-10-CM | POA: Insufficient documentation
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
## 2012-09-27 ENCOUNTER — Emergency Department (HOSPITAL_COMMUNITY): Payer: Medicare Other

## 2012-09-27 LAB — PROTIME-INR
INR: 1 (ref 0.00–1.49)
Prothrombin Time: 13.1 seconds (ref 11.6–15.2)

## 2012-09-27 LAB — CBC
MCH: 28.6 pg (ref 26.0–34.0)
MCHC: 34 g/dL (ref 30.0–36.0)
Platelets: 155 10*3/uL (ref 150–400)
RBC: 4.06 MIL/uL — ABNORMAL LOW (ref 4.22–5.81)

## 2012-09-27 LAB — BASIC METABOLIC PANEL
CO2: 24 mEq/L (ref 19–32)
Calcium: 8.9 mg/dL (ref 8.4–10.5)
GFR calc non Af Amer: 55 mL/min — ABNORMAL LOW (ref >90)
Potassium: 3.3 mEq/L — ABNORMAL LOW (ref 3.5–5.1)
Sodium: 134 mEq/L — ABNORMAL LOW (ref 135–145)

## 2012-09-27 MED ORDER — HYDROCODONE-ACETAMINOPHEN 5-325 MG PO TABS: 1.0000 | ORAL_TABLET | ORAL | Status: DC | PRN

## 2012-09-27 NOTE — ED Notes (Signed)
Pt remains alert and cooperative.  Pt to CT via stretcher

## 2012-09-27 NOTE — ED Notes (Signed)
Paper scrubs given to pt

## 2012-09-27 NOTE — ED Notes (Signed)
Sutures completed by MD. Pt tolerated well
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
## 2012-09-27 NOTE — ED Notes (Signed)
Pt ride here to take pt home. Dressing remains dry and intact.  Pt remains alert and oriented. amb to car without problem

## 2012-09-29 ENCOUNTER — Encounter (HOSPITAL_COMMUNITY): Payer: Self-pay | Admitting: *Deleted

## 2012-09-29 ENCOUNTER — Emergency Department (HOSPITAL_COMMUNITY): Payer: Medicare Other

## 2012-09-29 ENCOUNTER — Emergency Department (INDEPENDENT_AMBULATORY_CARE_PROVIDER_SITE_OTHER)
Admission: EM | Admit: 2012-09-29 | Discharge: 2012-09-29 | Disposition: A | Discharge status: Nursing Home (Includes ICF) | Payer: Medicare Other | Source: Home / Self Care | Attending: Emergency Medicine | Admitting: Emergency Medicine

## 2012-09-29 ENCOUNTER — Emergency Department (HOSPITAL_COMMUNITY)
Admission: EM | Admit: 2012-09-29 | Discharge: 2012-09-29 | Disposition: A | Discharge status: Home / Self Care | Payer: Medicare Other | Attending: Emergency Medicine | Admitting: Emergency Medicine

## 2012-09-29 DIAGNOSIS — S060X9A Concussion with loss of consciousness of unspecified duration, initial encounter: Secondary | ICD-10-CM

## 2012-09-29 DIAGNOSIS — R42 Dizziness and giddiness: Secondary | ICD-10-CM | POA: Insufficient documentation

## 2012-09-29 DIAGNOSIS — F17200 Nicotine dependence, unspecified, uncomplicated: Secondary | ICD-10-CM | POA: Insufficient documentation

## 2012-09-29 DIAGNOSIS — I2510 Atherosclerotic heart disease of native coronary artery without angina pectoris: Secondary | ICD-10-CM | POA: Insufficient documentation

## 2012-09-29 DIAGNOSIS — Z79899 Other long term (current) drug therapy: Secondary | ICD-10-CM | POA: Insufficient documentation

## 2012-09-29 DIAGNOSIS — E119 Type 2 diabetes mellitus without complications: Secondary | ICD-10-CM | POA: Insufficient documentation

## 2012-09-29 DIAGNOSIS — K219 Gastro-esophageal reflux disease without esophagitis: Secondary | ICD-10-CM | POA: Insufficient documentation

## 2012-09-29 DIAGNOSIS — S06309A Unspecified focal traumatic brain injury with loss of consciousness of unspecified duration, initial encounter: Secondary | ICD-10-CM

## 2012-09-29 DIAGNOSIS — N289 Disorder of kidney and ureter, unspecified: Secondary | ICD-10-CM | POA: Insufficient documentation

## 2012-09-29 DIAGNOSIS — S060XAA Concussion with loss of consciousness status unknown, initial encounter: Secondary | ICD-10-CM

## 2012-09-29 DIAGNOSIS — I129 Hypertensive chronic kidney disease with stage 1 through stage 4 chronic kidney disease, or unspecified chronic kidney disease: Secondary | ICD-10-CM | POA: Insufficient documentation

## 2012-09-29 DIAGNOSIS — M109 Gout, unspecified: Secondary | ICD-10-CM | POA: Insufficient documentation

## 2012-09-29 DIAGNOSIS — E669 Obesity, unspecified: Secondary | ICD-10-CM | POA: Insufficient documentation

## 2012-09-29 DIAGNOSIS — Z794 Long term (current) use of insulin: Secondary | ICD-10-CM | POA: Insufficient documentation

## 2012-09-29 DIAGNOSIS — IMO0002 Reserved for concepts with insufficient information to code with codable children: Secondary | ICD-10-CM | POA: Insufficient documentation

## 2012-09-29 DIAGNOSIS — N189 Chronic kidney disease, unspecified: Secondary | ICD-10-CM | POA: Insufficient documentation

## 2012-09-29 LAB — CBC WITH DIFFERENTIAL/PLATELET
Basophils Absolute: 0 10*3/uL (ref 0.0–0.1)
Lymphocytes Relative: 32 % (ref 12–46)
Neutro Abs: 2.8 10*3/uL (ref 1.7–7.7)
Neutrophils Relative %: 58 % (ref 43–77)
Platelets: 164 10*3/uL (ref 150–400)
RDW: 14.3 % (ref 11.5–15.5)
WBC: 4.8 10*3/uL (ref 4.0–10.5)

## 2012-09-29 LAB — BASIC METABOLIC PANEL
Calcium: 9.2 mg/dL (ref 8.4–10.5)
Creatinine, Ser: 1.24 mg/dL (ref 0.50–1.35)
GFR calc Af Amer: 66 mL/min — ABNORMAL LOW (ref >90)
Sodium: 139 mEq/L (ref 135–145)

## 2012-09-29 MED ORDER — HYDROCODONE-ACETAMINOPHEN 5-325 MG PO TABS: 1.0000 | ORAL_TABLET | ORAL | Status: DC | PRN

## 2012-09-29 NOTE — ED Notes (Signed)
Pt reports memory loss, headache, blurred vision, and unsteady gait. Dr. Vanita Panda notified
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
## 2012-11-13 ENCOUNTER — Emergency Department (HOSPITAL_COMMUNITY): Payer: Medicare Other

## 2012-11-13 ENCOUNTER — Emergency Department (HOSPITAL_COMMUNITY)
Admission: EM | Admit: 2012-11-13 | Discharge: 2012-11-13 | Disposition: A | Discharge status: Nursing Home (Includes ICF) | Payer: Medicare Other | Source: Home / Self Care | Attending: Family Medicine | Admitting: Family Medicine

## 2012-11-13 ENCOUNTER — Encounter (HOSPITAL_COMMUNITY): Payer: Self-pay | Admitting: Emergency Medicine

## 2012-11-13 ENCOUNTER — Encounter (HOSPITAL_COMMUNITY): Payer: Self-pay | Admitting: Cardiology

## 2012-11-13 ENCOUNTER — Emergency Department (HOSPITAL_COMMUNITY)
Admission: EM | Admit: 2012-11-13 | Discharge: 2012-11-13 | Disposition: A | Discharge status: Home / Self Care | Payer: Medicare Other | Attending: Emergency Medicine | Admitting: Emergency Medicine

## 2012-11-13 DIAGNOSIS — I129 Hypertensive chronic kidney disease with stage 1 through stage 4 chronic kidney disease, or unspecified chronic kidney disease: Secondary | ICD-10-CM | POA: Insufficient documentation

## 2012-11-13 DIAGNOSIS — Z8619 Personal history of other infectious and parasitic diseases: Secondary | ICD-10-CM | POA: Insufficient documentation

## 2012-11-13 DIAGNOSIS — F17200 Nicotine dependence, unspecified, uncomplicated: Secondary | ICD-10-CM | POA: Insufficient documentation

## 2012-11-13 DIAGNOSIS — R404 Transient alteration of awareness: Secondary | ICD-10-CM | POA: Insufficient documentation

## 2012-11-13 DIAGNOSIS — S0990XD Unspecified injury of head, subsequent encounter: Secondary | ICD-10-CM

## 2012-11-13 DIAGNOSIS — R51 Headache: Secondary | ICD-10-CM

## 2012-11-13 DIAGNOSIS — H538 Other visual disturbances: Secondary | ICD-10-CM | POA: Insufficient documentation

## 2012-11-13 DIAGNOSIS — E669 Obesity, unspecified: Secondary | ICD-10-CM | POA: Insufficient documentation

## 2012-11-13 DIAGNOSIS — I2510 Atherosclerotic heart disease of native coronary artery without angina pectoris: Secondary | ICD-10-CM | POA: Insufficient documentation

## 2012-11-13 DIAGNOSIS — Z79899 Other long term (current) drug therapy: Secondary | ICD-10-CM | POA: Insufficient documentation

## 2012-11-13 DIAGNOSIS — F0781 Postconcussional syndrome: Secondary | ICD-10-CM | POA: Insufficient documentation

## 2012-11-13 DIAGNOSIS — Z7982 Long term (current) use of aspirin: Secondary | ICD-10-CM | POA: Insufficient documentation

## 2012-11-13 DIAGNOSIS — R42 Dizziness and giddiness: Secondary | ICD-10-CM

## 2012-11-13 DIAGNOSIS — K219 Gastro-esophageal reflux disease without esophagitis: Secondary | ICD-10-CM | POA: Insufficient documentation

## 2012-11-13 DIAGNOSIS — N189 Chronic kidney disease, unspecified: Secondary | ICD-10-CM | POA: Insufficient documentation

## 2012-11-13 DIAGNOSIS — E119 Type 2 diabetes mellitus without complications: Secondary | ICD-10-CM | POA: Insufficient documentation
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
## 2015-04-08 ENCOUNTER — Encounter (HOSPITAL_COMMUNITY): Payer: Self-pay | Admitting: *Deleted

## 2015-04-08 ENCOUNTER — Emergency Department (HOSPITAL_COMMUNITY)
Admission: EM | Admit: 2015-04-08 | Discharge: 2015-04-08 | Disposition: A | Discharge status: Home / Self Care | Payer: Medicare Other | Attending: Emergency Medicine | Admitting: Emergency Medicine

## 2015-04-08 DIAGNOSIS — K219 Gastro-esophageal reflux disease without esophagitis: Secondary | ICD-10-CM | POA: Insufficient documentation

## 2015-04-08 DIAGNOSIS — Z7982 Long term (current) use of aspirin: Secondary | ICD-10-CM | POA: Insufficient documentation

## 2015-04-08 DIAGNOSIS — N189 Chronic kidney disease, unspecified: Secondary | ICD-10-CM | POA: Insufficient documentation

## 2015-04-08 DIAGNOSIS — Z720 Tobacco use: Secondary | ICD-10-CM | POA: Insufficient documentation

## 2015-04-08 DIAGNOSIS — E669 Obesity, unspecified: Secondary | ICD-10-CM | POA: Diagnosis not present

## 2015-04-08 DIAGNOSIS — M109 Gout, unspecified: Secondary | ICD-10-CM | POA: Insufficient documentation

## 2015-04-08 DIAGNOSIS — E119 Type 2 diabetes mellitus without complications: Secondary | ICD-10-CM | POA: Diagnosis not present

## 2015-04-08 DIAGNOSIS — I2510 Atherosclerotic heart disease of native coronary artery without angina pectoris: Secondary | ICD-10-CM | POA: Insufficient documentation

## 2015-04-08 DIAGNOSIS — I129 Hypertensive chronic kidney disease with stage 1 through stage 4 chronic kidney disease, or unspecified chronic kidney disease: Secondary | ICD-10-CM | POA: Insufficient documentation

## 2015-04-08 DIAGNOSIS — R110 Nausea: Secondary | ICD-10-CM | POA: Insufficient documentation

## 2015-04-08 DIAGNOSIS — Z79899 Other long term (current) drug therapy: Secondary | ICD-10-CM | POA: Diagnosis not present

## 2015-04-08 MED ORDER — HYDROCODONE-ACETAMINOPHEN 5-325 MG PO TABS: 2.0000 | ORAL_TABLET | ORAL | Status: DC | PRN

## 2015-04-08 MED ORDER — PREDNISONE 20 MG PO TABS: ORAL_TABLET | ORAL | Status: DC

## 2015-04-08 MED ORDER — HYDROMORPHONE HCL 1 MG/ML IJ SOLN
1.0000 mg | Freq: Once | INTRAMUSCULAR | Status: AC
  Administered 2015-04-08: 1 mg via INTRAMUSCULAR
  Filled 2015-04-08: qty 1

## 2015-04-08 MED ORDER — ONDANSETRON 4 MG PO TBDP: ORAL_TABLET | ORAL | Status: DC

## 2015-04-08 MED ORDER — HYDROCODONE-ACETAMINOPHEN 5-325 MG PO TABS: 2.0000 | ORAL_TABLET | Freq: Once | ORAL | Status: DC

## 2015-04-08 NOTE — Discharge Instructions (Signed)
Prednisone will elevated sugars, monitor. For severe pain take norco or vicodin however realize they have the potential for addiction and it can make you sleepy and has tylenol in it.  No operating machinery while taking. If you were given medicines take as directed.  If you are on coumadin or contraceptives realize their levels and effectiveness is altered by many different medicines.  If you have any reaction (rash, tongues swelling, other) to the medicines stop taking and see a physician.    If your blood pressure was elevated in the ER make sure you follow up for management with a primary doctor or return for chest pain, shortness of breath or stroke symptoms.  Please follow up as directed and return to the ER or see a physician for new or worsening symptoms.  Thank you. Filed Vitals:   04/08/15 0957  BP: 139/76  Pulse: 68  Temp: 98 F (36.7 C)  TempSrc: Oral  Resp: 17  SpO2: 100%

## 2015-04-08 NOTE — ED Provider Notes (Signed)
CSN: [id number]     Arrival date & time 04/08/15  [id number] History   First MD Initiated Contact with Patient 04/08/15 1003     No chief complaint on file.    (Consider location/radiation/quality/duration/timing/severity/associated sxs/prior Treatment) HPI Comments: 74 year old male with history of coronary artery disease, high blood pressure, diabetes, kidney disease presents with worsening right foot pain, mild left foot pain. Similar to GERD history. Improves with prednisone however he finished prednisone now pain worsening. Patient not taking any currently for pain. Patient is on telemetry. I'll for years. History of gout for years. No fevers or chills. Mild nausea. No focal abdominal pain. Pain worse with palpation.  The history is provided by the patient.    Past Medical History  Diagnosis Date  . Obesity   . Tobacco abuse counseling   . Gout   . GERD (gastroesophageal reflux disease)   . Diabetes mellitus     type 2-non-insulin  . Coronary artery disease   . Chronic kidney disease     renal insufficiency  . Hypertension    Past Surgical History  Procedure Laterality Date  . Cardiac catheterization  02/08/02  . Cardiac catheterization  11/20/10    drug eluting stent;mid left LAD, There is mild hypokinesis of the anterolateral wall. LVEF  is estimated at 50-55%  . Penile prosthesis implant      '05  . Insertion of bronchial stent      went in groin to R chest   Family History  Problem Relation Age of Onset  . Aneurysm Mother   . Aneurysm Father    History  Substance Use Topics  . Smoking status: Current Every Day Smoker -- 0.50 packs/day    Types: Cigarettes  . Smokeless tobacco: Not on file  . Alcohol Use: No    Review of Systems  Constitutional: Negative for fever and chills.  HENT: Negative for congestion.   Eyes: Negative for visual disturbance.  Respiratory: Negative for shortness of breath.   Cardiovascular: Negative for chest pain.  Gastrointestinal:  Positive for nausea. Negative for vomiting and abdominal pain.  Genitourinary: Negative for dysuria and flank pain.  Musculoskeletal: Positive for arthralgias. Negative for back pain, neck pain and neck stiffness.  Skin: Negative for rash.  Neurological: Negative for light-headedness and headaches.      Allergies  Alpha blocker quinazolines and Latex  Home Medications   Prior to Admission medications   Medication Sig Start Date End Date Taking? Authorizing Provider  allopurinol (ZYLOPRIM) 100 MG tablet Take 100 mg by mouth daily.     Yes Historical Provider, MD  amLODipine (NORVASC) 10 MG tablet Take 10 mg by mouth daily.     Yes Historical Provider, MD  aspirin EC 81 MG tablet Take 81 mg by mouth daily.   Yes Historical Provider, MD  insulin glargine (LANTUS) 100 UNIT/ML injection Inject 30 Units into the skin at bedtime.    Yes Historical Provider, MD  losartan (COZAAR) 50 MG tablet Take 50 mg by mouth daily.   Yes Historical Provider, MD  metFORMIN (GLUCOPHAGE) 500 MG tablet Take 500 mg by mouth daily with breakfast.   Yes Historical Provider, MD  nitroGLYCERIN (NITROSTAT) 0.4 MG SL tablet Place 0.4 mg under the tongue every 5 (five) minutes as needed. For chest pain   Yes Historical Provider, MD  pantoprazole (PROTONIX) 40 MG tablet Take 40 mg by mouth daily.   Yes Historical Provider, MD  terazosin (HYTRIN) 5 MG capsule Take 5 mg by mouth  every morning.   Yes Historical Provider, MD  carvedilol (COREG) 3.125 MG tablet Take 3.125 mg by mouth 2 (two) times daily with a meal.      Historical Provider, MD  hydrochlorothiazide (HYDRODIURIL) 25 MG tablet Take 12.5 mg by mouth daily.    Historical Provider, MD  HYDROcodone-acetaminophen (NORCO) 5-325 MG per tablet Take 2 tablets by mouth every 4 (four) hours as needed. 04/08/15   Elnora Morrison, MD  isosorbide mononitrate (IMDUR) 30 MG 24 hr tablet Take 30 mg by mouth daily.    Historical Provider, MD  ondansetron (ZOFRAN ODT) 4 MG  disintegrating tablet 4mg  ODT q4 hours prn nausea/vomit 04/08/15   Elnora Morrison, MD  potassium chloride SA (K-DUR,KLOR-CON) 20 MEQ tablet Take 10 mEq by mouth daily.    Historical Provider, MD  pravastatin (PRAVACHOL) 80 MG tablet Take 80 mg by mouth daily.      Historical Provider, MD  predniSONE (DELTASONE) 20 MG tablet 2 tabs po daily x 3 days 04/08/15   Elnora Morrison, MD  ranitidine (ZANTAC) 150 MG tablet Take 150 mg by mouth 2 (two) times daily.    Historical Provider, MD   BP 139/76 mmHg  Pulse 68  Temp(Src) 98 F (36.7 C) (Oral)  Resp 17  SpO2 100% Physical Exam  Constitutional: He is oriented to person, place, and time. He appears well-developed and well-nourished.  HENT:  Head: Normocephalic and atraumatic.  Eyes: Conjunctivae are normal. Right eye exhibits no discharge. Left eye exhibits no discharge.  Neck: Normal range of motion. Neck supple. No tracheal deviation present.  Cardiovascular: Normal rate and regular rhythm.   Pulmonary/Chest: Effort normal and breath sounds normal.  Abdominal: Soft. He exhibits no distension. There is no tenderness. There is no guarding.  Musculoskeletal: He exhibits edema and tenderness.  Patient has mild swelling tenderness to right great toe, mild warmth. No wound or spreading cellulitis. Tender palpation. Mild swelling to the dorsum of the right foot. No involvement of the ankle. Very mild discomfort left great toe.  Neurological: He is alert and oriented to person, place, and time.  Skin: Skin is warm. No rash noted.  Psychiatric: He has a normal mood and affect.  Nursing note and vitals reviewed.   ED Course  Procedures (including critical care time) Labs Review Labs Reviewed - No data to display  Imaging Review No results found.   EKG Interpretation None      MDM   Final diagnoses:  Acute gout of right foot, unspecified cause   Clinically gout comes similar previous, no suspicion for septic joint at this time. Patient has  heart disease kidney disease and diabetes loss medicine choices difficult. Plan for a few days of prednisone, Vicodin and follow-up with primary doctor.  Results and differential diagnosis were discussed with the patient/parent/guardian. Xrays were independently reviewed by myself.  Close follow up outpatient was discussed, comfortable with the plan.   Medications  HYDROmorphone (DILAUDID) injection 1 mg (not administered)    Filed Vitals:   04/08/15 0957  BP: 139/76  Pulse: 68  Temp: 98 F (36.7 C)  TempSrc: Oral  Resp: 17  SpO2: 100%    Final diagnoses:  Acute gout of right foot, unspecified cause      Elnora Morrison, MD 04/08/15 1030

## 2015-04-08 NOTE — ED Notes (Signed)
Pt was given iv pain medication.  Per Dr. Reather Converse, he is not to be discharged until his ride is here and his pain is decreased.

## 2015-04-08 NOTE — ED Notes (Signed)
Recurrent episode of gout bilateral feet, right is worse than left

## 2015-04-08 NOTE — ED Notes (Signed)
Gout in bilateral feet, right worse than left.  Right foot has moderate edema

## 2015-05-07 ENCOUNTER — Emergency Department (HOSPITAL_COMMUNITY)
Admission: EM | Admit: 2015-05-07 | Discharge: 2015-05-07 | Disposition: A | Discharge status: Home / Self Care | Payer: Medicare Other | Attending: Physician Assistant | Admitting: Physician Assistant

## 2015-05-07 ENCOUNTER — Encounter (HOSPITAL_COMMUNITY): Payer: Self-pay | Admitting: Emergency Medicine

## 2015-05-07 ENCOUNTER — Emergency Department (HOSPITAL_COMMUNITY): Payer: Medicare Other

## 2015-05-07 DIAGNOSIS — Z91040 Latex allergy status: Secondary | ICD-10-CM | POA: Diagnosis not present

## 2015-05-07 DIAGNOSIS — Z794 Long term (current) use of insulin: Secondary | ICD-10-CM | POA: Insufficient documentation

## 2015-05-07 DIAGNOSIS — M10472 Other secondary gout, left ankle and foot: Secondary | ICD-10-CM

## 2015-05-07 DIAGNOSIS — K219 Gastro-esophageal reflux disease without esophagitis: Secondary | ICD-10-CM | POA: Insufficient documentation

## 2015-05-07 DIAGNOSIS — Z720 Tobacco use: Secondary | ICD-10-CM | POA: Diagnosis not present

## 2015-05-07 DIAGNOSIS — M79672 Pain in left foot: Secondary | ICD-10-CM | POA: Diagnosis present

## 2015-05-07 DIAGNOSIS — E669 Obesity, unspecified: Secondary | ICD-10-CM | POA: Insufficient documentation

## 2015-05-07 DIAGNOSIS — I129 Hypertensive chronic kidney disease with stage 1 through stage 4 chronic kidney disease, or unspecified chronic kidney disease: Secondary | ICD-10-CM | POA: Diagnosis not present

## 2015-05-07 DIAGNOSIS — E119 Type 2 diabetes mellitus without complications: Secondary | ICD-10-CM | POA: Diagnosis not present

## 2015-05-07 DIAGNOSIS — M7989 Other specified soft tissue disorders: Secondary | ICD-10-CM | POA: Insufficient documentation

## 2015-05-07 DIAGNOSIS — Z9889 Other specified postprocedural states: Secondary | ICD-10-CM | POA: Diagnosis not present

## 2015-05-07 DIAGNOSIS — I2510 Atherosclerotic heart disease of native coronary artery without angina pectoris: Secondary | ICD-10-CM | POA: Diagnosis not present

## 2015-05-07 DIAGNOSIS — N189 Chronic kidney disease, unspecified: Secondary | ICD-10-CM | POA: Diagnosis not present

## 2015-05-07 DIAGNOSIS — Z7982 Long term (current) use of aspirin: Secondary | ICD-10-CM | POA: Diagnosis not present

## 2015-05-07 DIAGNOSIS — Z79899 Other long term (current) drug therapy: Secondary | ICD-10-CM | POA: Insufficient documentation

## 2015-05-07 MED ORDER — PREDNISONE 20 MG PO TABS
40.0000 mg | ORAL_TABLET | Freq: Once | ORAL | Status: AC
  Administered 2015-05-07: 40 mg via ORAL
  Filled 2015-05-07: qty 2

## 2015-05-07 MED ORDER — PREDNISONE 10 MG PO TABS
ORAL_TABLET | ORAL | Status: DC
Start: 2015-05-07 — End: 2017-03-29

## 2015-05-07 MED ORDER — HYDROCODONE-ACETAMINOPHEN 5-325 MG PO TABS: 1.0000 | ORAL_TABLET | Freq: Four times a day (QID) | ORAL | Status: DC | PRN

## 2015-05-07 NOTE — ED Provider Notes (Signed)
CSN: [id number]     Arrival date & time 05/07/15  1003 History   First MD Initiated Contact with Patient 05/07/15 1022     Chief Complaint  Patient presents with  . Foot Pain     (Consider location/radiation/quality/duration/timing/severity/associated sxs/prior Treatment) HPI   Patient is a pleasant 74 year old male presenting with left foot swelling and pain. Patient has history of gout. He says it feels exactly the same as his usual gout flares. Patient says he ran out of allopurinol because his online pharmacy mailed to the wrong address. He's been without allopurinol for one week. Patient states we usually give prednisone and pain pills and he gets better within a couple days. Patient states she has an appointment tomorrow with his Physician  Past Medical History  Diagnosis Date  . Obesity   . Tobacco abuse counseling   . Gout   . GERD (gastroesophageal reflux disease)   . Diabetes mellitus     type 2-non-insulin  . Coronary artery disease   . Chronic kidney disease     renal insufficiency  . Hypertension    Past Surgical History  Procedure Laterality Date  . Cardiac catheterization  02/08/02  . Cardiac catheterization  11/20/10    drug eluting stent;mid left LAD, There is mild hypokinesis of the anterolateral wall. LVEF  is estimated at 50-55%  . Penile prosthesis implant      '05  . Insertion of bronchial stent      went in groin to R chest   Family History  Problem Relation Age of Onset  . Aneurysm Mother   . Aneurysm Father    Social History  Substance Use Topics  . Smoking status: Current Every Day Smoker -- 0.50 packs/day    Types: Cigarettes  . Smokeless tobacco: None  . Alcohol Use: No    Review of Systems  Constitutional: Negative for activity change.  Respiratory: Negative for shortness of breath.   Cardiovascular: Negative for chest pain.  Gastrointestinal: Negative for abdominal pain.  Musculoskeletal: Positive for joint swelling.       Allergies  Alpha blocker quinazolines and Latex  Home Medications   Prior to Admission medications   Medication Sig Start Date End Date Taking? Authorizing Provider  albuterol (PROVENTIL HFA;VENTOLIN HFA) 108 (90 BASE) MCG/ACT inhaler Inhale 1 puff into the lungs every 6 (six) hours as needed for wheezing or shortness of breath.    Historical Provider, MD  allopurinol (ZYLOPRIM) 100 MG tablet Take 100 mg by mouth daily.      Historical Provider, MD  amLODipine (NORVASC) 10 MG tablet Take 10 mg by mouth daily.      Historical Provider, MD  aspirin EC 81 MG tablet Take 81 mg by mouth daily.    Historical Provider, MD  atorvastatin (LIPITOR) 40 MG tablet Take 40 mg by mouth daily at 6 PM.    Historical Provider, MD  HYDROcodone-acetaminophen (NORCO) 5-325 MG per tablet Take 2 tablets by mouth every 4 (four) hours as needed. 04/08/15   Elnora Morrison, MD  insulin glargine (LANTUS) 100 UNIT/ML injection Inject 30 Units into the skin at bedtime.     Historical Provider, MD  insulin regular (NOVOLIN R,HUMULIN R) 100 units/mL injection Inject 10 Units into the skin 3 (three) times daily before meals.    Historical Provider, MD  losartan (COZAAR) 50 MG tablet Take 50 mg by mouth daily.    Historical Provider, MD  nitroGLYCERIN (NITROSTAT) 0.4 MG SL tablet Place 0.4 mg under the  tongue every 5 (five) minutes as needed. For chest pain    Historical Provider, MD  ondansetron (ZOFRAN ODT) 4 MG disintegrating tablet 4mg  ODT q4 hours prn nausea/vomit 04/08/15   Elnora Morrison, MD  pantoprazole (PROTONIX) 40 MG tablet Take 40 mg by mouth daily.    Historical Provider, MD  predniSONE (DELTASONE) 20 MG tablet 2 tabs po daily x 3 days 04/08/15   Elnora Morrison, MD  ranitidine (ZANTAC) 150 MG tablet Take 150 mg by mouth 2 (two) times daily.    Historical Provider, MD  terazosin (HYTRIN) 5 MG capsule Take 5 mg by mouth every morning.    Historical Provider, MD   BP 152/76 mmHg  Pulse 77  Temp(Src) 98.1 F  (36.7 C) (Oral)  Resp 20  SpO2 98% Physical Exam  Constitutional: He is oriented to person, place, and time. He appears well-nourished.  HENT:  Head: Normocephalic.  Eyes: Conjunctivae are normal.  Cardiovascular: Normal rate.   Pulmonary/Chest: Effort normal.  Musculoskeletal:  Mild swelling to the mid foot of the left foot. No pain with range of motion of ankle. No surrounding erythema.  Neurological: He is oriented to person, place, and time.  Skin: Skin is warm and dry. He is not diaphoretic.  Psychiatric: He has a normal mood and affect. His behavior is normal.    ED Course  Procedures (including critical care time) Labs Review Labs Reviewed - No data to display  Imaging Review No results found. I, [place], personally reviewed and evaluated these images and lab results as part of my medical decision-making.   EKG Interpretation None      MDM   Final diagnoses:  None   patient is a pleasant 74 year old male with history of gout presenting today with gout flare. Do not suspect septic arthritis given no pain with motion nor surrounding erythema. No fevers. Patient states this feels like his usual gout flare and indeed he's run out of his allopurinol. . We will treat with prednisone and pain medications same as last visit. He has follow-up appointment tomorrow.  Neesa Knapik Julio Alm, MD 05/07/15 1624

## 2015-05-07 NOTE — ED Notes (Signed)
Patient transported to X-ray 

## 2015-05-07 NOTE — Discharge Instructions (Signed)
Gout Gout is an inflammatory arthritis caused by a buildup of uric acid crystals in the joints. Uric acid is a chemical that is normally present in the blood. When the level of uric acid in the blood is too high it can form crystals that deposit in your joints and tissues. This causes joint redness, soreness, and swelling (inflammation). Repeat attacks are common. Over time, uric acid crystals can form into masses (tophi) near a joint, destroying bone and causing disfigurement. Gout is treatable and often preventable. CAUSES  The disease begins with elevated levels of uric acid in the blood. Uric acid is produced by your body when it breaks down a naturally found substance called purines. Certain foods you eat, such as meats and fish, contain high amounts of purines. Causes of an elevated uric acid level include:  Being passed down from parent to child (heredity).  Diseases that cause increased uric acid production (such as obesity, psoriasis, and certain cancers).  Excessive alcohol use.  Diet, especially diets rich in meat and seafood.  Medicines, including certain cancer-fighting medicines (chemotherapy), water pills (diuretics), and aspirin.  Chronic kidney disease. The kidneys are no longer able to remove uric acid well.  Problems with metabolism. Conditions strongly associated with gout include:  Obesity.  High blood pressure.  High cholesterol.  Diabetes. Not everyone with elevated uric acid levels gets gout. It is not understood why some people get gout and others do not. Surgery, joint injury, and eating too much of certain foods are some of the factors that can lead to gout attacks. SYMPTOMS   An attack of gout comes on quickly. It causes intense pain with redness, swelling, and warmth in a joint.  Fever can occur.  Often, only one joint is involved. Certain joints are more commonly involved:  Base of the big toe.  Knee.  Ankle.  Wrist.  Finger. Without  treatment, an attack usually goes away in a few days to weeks. Between attacks, you usually will not have symptoms, which is different from many other forms of arthritis. DIAGNOSIS  Your caregiver will suspect gout based on your symptoms and exam. In some cases, tests may be recommended. The tests may include:  Blood tests.  Urine tests.  X-rays.  Joint fluid exam. This exam requires a needle to remove fluid from the joint (arthrocentesis). Using a microscope, gout is confirmed when uric acid crystals are seen in the joint fluid. TREATMENT  There are two phases to gout treatment: treating the sudden onset (acute) attack and preventing attacks (prophylaxis).  Treatment of an Acute Attack.  Medicines are used. These include anti-inflammatory medicines or steroid medicines.  An injection of steroid medicine into the affected joint is sometimes necessary.  The painful joint is rested. Movement can worsen the arthritis.  You may use warm or cold treatments on painful joints, depending which works best for you.  Treatment to Prevent Attacks.  If you suffer from frequent gout attacks, your caregiver may advise preventive medicine. These medicines are started after the acute attack subsides. These medicines either help your kidneys eliminate uric acid from your body or decrease your uric acid production. You may need to stay on these medicines for a very long time.  The early phase of treatment with preventive medicine can be associated with an increase in acute gout attacks. For this reason, during the first few months of treatment, your caregiver may also advise you to take medicines usually used for acute gout treatment. Be sure you   understand your caregiver's directions. Your caregiver may make several adjustments to your medicine dose before these medicines are effective.  Discuss dietary treatment with your caregiver or dietitian. Alcohol and drinks high in sugar and fructose and foods  such as meat, poultry, and seafood can increase uric acid levels. Your caregiver or dietitian can advise you on drinks and foods that should be limited. HOME CARE INSTRUCTIONS   Do not take aspirin to relieve pain. This raises uric acid levels.  Only take over-the-counter or prescription medicines for pain, discomfort, or fever as directed by your caregiver.  Rest the joint as much as possible. When in bed, keep sheets and blankets off painful areas.  Keep the affected joint raised (elevated).  Apply warm or cold treatments to painful joints. Use of warm or cold treatments depends on which works best for you.  Use crutches if the painful joint is in your leg.  Drink enough fluids to keep your urine clear or pale yellow. This helps your body get rid of uric acid. Limit alcohol, sugary drinks, and fructose drinks.  Follow your dietary instructions. Pay careful attention to the amount of protein you eat. Your daily diet should emphasize fruits, vegetables, whole grains, and fat-free or low-fat milk products. Discuss the use of coffee, vitamin C, and cherries with your caregiver or dietitian. These may be helpful in lowering uric acid levels.  Maintain a healthy body weight. SEEK MEDICAL CARE IF:   You develop diarrhea, vomiting, or any side effects from medicines.  You do not feel better in 24 hours, or you are getting worse. SEEK IMMEDIATE MEDICAL CARE IF:   Your joint becomes suddenly more tender, and you have chills or a fever. MAKE SURE YOU:   Understand these instructions.  Will watch your condition.  Will get help right away if you are not doing well or get worse. Document Released: 09/06/2000 Document Revised: 01/24/2014 Document Reviewed: 04/22/2012 [employer] Patient Information 2015 [employer]. This information is not intended to replace advice given to you by your health care provider. Make sure you discuss any questions you have with your health care  provider. Low-Purine Diet Purines are compounds that affect the level of uric acid in your body. A low-purine diet is a diet that is low in purines. Eating a low-purine diet can prevent the level of uric acid in your body from getting too high and causing gout or kidney stones or both. WHAT DO I NEED TO KNOW ABOUT THIS DIET?  Choose low-purine foods. Examples of low-purine foods are listed in the next section.  Drink plenty of fluids, especially water. Fluids can help remove uric acid from your body. Try to drink 8-16 cups (1.9-3.8 L) a day.  Limit foods high in fat, especially saturated fat, as fat makes it harder for the body to get rid of uric acid. Foods high in saturated fat include pizza, cheese, ice cream, whole milk, fried foods, and gravies. Choose foods that are lower in fat and lean sources of protein. Use olive oil when cooking as it contains healthy fats that are not high in saturated fat.  Limit alcohol. Alcohol interferes with the elimination of uric acid from your body. If you are having a gout attack, avoid all alcohol.  Keep in mind that different people's bodies react differently to different foods. You will probably learn over time which foods do or do not affect you. If you discover that a food tends to cause your gout to flare up,   avoid eating that food. You can more freely enjoy foods that do not cause problems. If you have any questions about a food item, talk to your dietitian or health care provider. WHICH FOODS ARE LOW, MODERATE, AND HIGH IN PURINES? The following is a list of foods that are low, moderate, and high in purines. You can eat any amount of the foods that are low in purines. You may be able to have small amounts of foods that are moderate in purines. Ask your health care provider how much of a food moderate in purines you can have. Avoid foods high in purines. Grains  Foods low in purines: Enriched white bread, pasta, rice, cake, cornbread, popcorn.  Foods  moderate in purines: Whole-grain breads and cereals, wheat germ, bran, oatmeal. Uncooked oatmeal. Dry wheat bran or wheat germ.  Foods high in purines: Pancakes, French toast, biscuits, muffins. Vegetables  Foods low in purines: All vegetables, except those that are moderate in purines.  Foods moderate in purines: Asparagus, cauliflower, spinach, mushrooms, green peas. Fruits  All fruits are low in purines. Meats and other Protein Foods  Foods low in purines: Eggs, nuts, peanut butter.  Foods moderate in purines: 80-90% lean beef, lamb, veal, pork, poultry, fish, eggs, peanut butter, nuts. Crab, lobster, oysters, and shrimp. Cooked dried beans, peas, and lentils.  Foods high in purines: Anchovies, sardines, herring, mussels, tuna, codfish, scallops, trout, and haddock. Bacon. Organ meats (such as liver or kidney). Tripe. Game meat. Goose. Sweetbreads. Dairy  All dairy foods are low in purines. Low-fat and fat-free dairy products are best because they are low in saturated fat. Beverages  Drinks low in purines: Water, carbonated beverages, tea, coffee, cocoa.  Drinks moderate in purines: Soft drinks and other drinks sweetened with high-fructose corn syrup. Juices. To find whether a food or drink is sweetened with high-fructose corn syrup, look at the ingredients list.  Drinks high in purines: Alcoholic beverages (such as beer). Condiments  Foods low in purines: Salt, herbs, olives, pickles, relishes, vinegar.  Foods moderate in purines: Butter, margarine, oils, mayonnaise. Fats and Oils  Foods low in purines: All types, except gravies and sauces made with meat.  Foods high in purines: Gravies and sauces made with meat. Other Foods  Foods low in purines: Sugars, sweets, gelatin. Cake. Soups made without meat.  Foods moderate in purines: Meat-based or fish-based soups, broths, or bouillons. Foods and drinks sweetened with high-fructose corn syrup.  Foods high in purines:  High-fat desserts (such as ice cream, cookies, cakes, pies, doughnuts, and chocolate). Contact your dietitian for more information on foods that are not listed here. Document Released: 01/04/2011 Document Revised: 09/14/2013 Document Reviewed: 08/16/2013 [employer] Patient Information 2015 [employer]. This information is not intended to replace advice given to you by your health care provider. Make sure you discuss any questions you have with your health care provider.  

## 2015-05-07 NOTE — ED Notes (Signed)
Pt reports L foot swelling and pain for last 3 days. No injury. Had R foot swelling a month ago which has gotten better.

## 2017-03-29 ENCOUNTER — Emergency Department (HOSPITAL_COMMUNITY): Payer: Medicare Other

## 2017-03-29 ENCOUNTER — Encounter (HOSPITAL_COMMUNITY): Payer: Self-pay

## 2017-03-29 ENCOUNTER — Observation Stay (HOSPITAL_COMMUNITY)
Admission: EM | Admit: 2017-03-29 | Discharge: 2017-03-30 | Disposition: A | Discharge status: Home / Self Care | Payer: Medicare Other | Attending: Family Medicine | Admitting: Family Medicine

## 2017-03-29 DIAGNOSIS — I129 Hypertensive chronic kidney disease with stage 1 through stage 4 chronic kidney disease, or unspecified chronic kidney disease: Secondary | ICD-10-CM | POA: Insufficient documentation

## 2017-03-29 DIAGNOSIS — I2510 Atherosclerotic heart disease of native coronary artery without angina pectoris: Secondary | ICD-10-CM | POA: Diagnosis not present

## 2017-03-29 DIAGNOSIS — Z7982 Long term (current) use of aspirin: Secondary | ICD-10-CM | POA: Diagnosis not present

## 2017-03-29 DIAGNOSIS — N289 Disorder of kidney and ureter, unspecified: Secondary | ICD-10-CM

## 2017-03-29 DIAGNOSIS — E162 Hypoglycemia, unspecified: Secondary | ICD-10-CM | POA: Diagnosis not present

## 2017-03-29 DIAGNOSIS — Z794 Long term (current) use of insulin: Secondary | ICD-10-CM | POA: Diagnosis not present

## 2017-03-29 DIAGNOSIS — Z79899 Other long term (current) drug therapy: Secondary | ICD-10-CM | POA: Insufficient documentation

## 2017-03-29 DIAGNOSIS — N189 Chronic kidney disease, unspecified: Secondary | ICD-10-CM | POA: Insufficient documentation

## 2017-03-29 DIAGNOSIS — Z91040 Latex allergy status: Secondary | ICD-10-CM | POA: Insufficient documentation

## 2017-03-29 DIAGNOSIS — R55 Syncope and collapse: Secondary | ICD-10-CM | POA: Diagnosis present

## 2017-03-29 DIAGNOSIS — R001 Bradycardia, unspecified: Secondary | ICD-10-CM | POA: Diagnosis not present

## 2017-03-29 DIAGNOSIS — E1122 Type 2 diabetes mellitus with diabetic chronic kidney disease: Secondary | ICD-10-CM | POA: Insufficient documentation

## 2017-03-29 DIAGNOSIS — F17210 Nicotine dependence, cigarettes, uncomplicated: Secondary | ICD-10-CM | POA: Insufficient documentation

## 2017-03-29 LAB — COMPREHENSIVE METABOLIC PANEL
ALK PHOS: 79 U/L (ref 38–126)
ALT: 13 U/L — ABNORMAL LOW (ref 17–63)
AST: 18 U/L (ref 15–41)
Albumin: 3 g/dL — ABNORMAL LOW (ref 3.5–5.0)
Anion gap: 5 (ref 5–15)
BILIRUBIN TOTAL: 0.6 mg/dL (ref 0.3–1.2)
BUN: 27 mg/dL — ABNORMAL HIGH (ref 6–20)
CALCIUM: 8.4 mg/dL — AB (ref 8.9–10.3)
CO2: 24 mmol/L (ref 22–32)
CREATININE: 2.35 mg/dL — AB (ref 0.61–1.24)
Chloride: 109 mmol/L (ref 101–111)
GFR calc non Af Amer: 26 mL/min — ABNORMAL LOW (ref >60)
GFR, EST AFRICAN AMERICAN: 30 mL/min — AB (ref >60)
Glucose, Bld: 102 mg/dL — ABNORMAL HIGH (ref 65–99)
Potassium: 3.7 mmol/L (ref 3.5–5.1)
Sodium: 138 mmol/L (ref 135–145)
TOTAL PROTEIN: 7.3 g/dL (ref 6.5–8.1)

## 2017-03-29 LAB — URINALYSIS, ROUTINE W REFLEX MICROSCOPIC
BILIRUBIN URINE: NEGATIVE
Bacteria, UA: NONE SEEN
GLUCOSE, UA: 50 mg/dL — AB
KETONES UR: NEGATIVE mg/dL
LEUKOCYTES UA: NEGATIVE
NITRITE: NEGATIVE
Protein, ur: 100 mg/dL — AB
SQUAMOUS EPITHELIAL / LPF: NONE SEEN
Specific Gravity, Urine: 1.017 (ref 1.005–1.030)
WBC, UA: NONE SEEN WBC/hpf (ref 0–5)
pH: 5 (ref 5.0–8.0)

## 2017-03-29 LAB — I-STAT CHEM 8, ED
BUN: 32 mg/dL — ABNORMAL HIGH (ref 6–20)
CREATININE: 2.3 mg/dL — AB (ref 0.61–1.24)
Calcium, Ion: 1.17 mmol/L (ref 1.15–1.40)
Chloride: 107 mmol/L (ref 101–111)
Glucose, Bld: 97 mg/dL (ref 65–99)
HEMATOCRIT: 36 % — AB (ref 39.0–52.0)
HEMOGLOBIN: 12.2 g/dL — AB (ref 13.0–17.0)
POTASSIUM: 3.8 mmol/L (ref 3.5–5.1)
SODIUM: 142 mmol/L (ref 135–145)
TCO2: 23 mmol/L (ref 0–100)

## 2017-03-29 LAB — TROPONIN I: Troponin I: <0.03 ng/mL (ref <0.03)

## 2017-03-29 LAB — CBC WITH DIFFERENTIAL/PLATELET
BASOS ABS: 0 10*3/uL (ref 0.0–0.1)
BASOS PCT: 0 %
EOS ABS: 0.1 10*3/uL (ref 0.0–0.7)
Eosinophils Relative: 1 %
HEMATOCRIT: 37.9 % — AB (ref 39.0–52.0)
Hemoglobin: 12.5 g/dL — ABNORMAL LOW (ref 13.0–17.0)
Lymphocytes Relative: 14 %
Lymphs Abs: 1.4 10*3/uL (ref 0.7–4.0)
MCH: 28.5 pg (ref 26.0–34.0)
MCHC: 33 g/dL (ref 30.0–36.0)
MCV: 86.5 fL (ref 78.0–100.0)
MONO ABS: 0.4 10*3/uL (ref 0.1–1.0)
Monocytes Relative: 4 %
NEUTROS ABS: 8.6 10*3/uL — AB (ref 1.7–7.7)
NEUTROS PCT: 81 %
Platelets: 185 10*3/uL (ref 150–400)
RBC: 4.38 MIL/uL (ref 4.22–5.81)
RDW: 15.2 % (ref 11.5–15.5)
WBC: 10.5 10*3/uL (ref 4.0–10.5)

## 2017-03-29 LAB — GLUCOSE, CAPILLARY
GLUCOSE-CAPILLARY: 249 mg/dL — AB (ref 65–99)
GLUCOSE-CAPILLARY: 188 mg/dL — AB (ref 65–99)
Glucose-Capillary: 282 mg/dL — ABNORMAL HIGH (ref 65–99)

## 2017-03-29 LAB — RAPID URINE DRUG SCREEN, HOSP PERFORMED
Amphetamines: NOT DETECTED
BARBITURATES: NOT DETECTED
Benzodiazepines: NOT DETECTED
Cocaine: POSITIVE — AB
Opiates: NOT DETECTED
Tetrahydrocannabinol: NOT DETECTED

## 2017-03-29 LAB — CBG MONITORING, ED
GLUCOSE-CAPILLARY: 100 mg/dL — AB (ref 65–99)
Glucose-Capillary: 61 mg/dL — ABNORMAL LOW (ref 65–99)

## 2017-03-29 LAB — TSH: TSH: 1.331 u[IU]/mL (ref 0.350–4.500)

## 2017-03-29 MED ORDER — TERAZOSIN HCL 5 MG PO CAPS: 5.0000 mg | ORAL_CAPSULE | Freq: Every morning | ORAL | Status: DC

## 2017-03-29 MED ORDER — ASPIRIN EC 81 MG PO TBEC
81.0000 mg | DELAYED_RELEASE_TABLET | Freq: Every day | ORAL | Status: DC
  Administered 2017-03-29 – 2017-03-30 (×2): 81 mg via ORAL
  Filled 2017-03-29 (×2): qty 1

## 2017-03-29 MED ORDER — AMLODIPINE BESYLATE 5 MG PO TABS
10.0000 mg | ORAL_TABLET | Freq: Every day | ORAL | Status: DC
  Administered 2017-03-29 – 2017-03-30 (×2): 10 mg via ORAL
  Filled 2017-03-29 (×2): qty 2

## 2017-03-29 MED ORDER — INSULIN ASPART 100 UNIT/ML ~~LOC~~ SOLN: 4.0000 [IU] | Freq: Three times a day (TID) | SUBCUTANEOUS | Status: DC

## 2017-03-29 MED ORDER — VITAMIN D 1000 UNITS PO TABS: 1000.0000 [IU] | ORAL_TABLET | Freq: Every day | ORAL | Status: DC

## 2017-03-29 MED ORDER — HEPARIN SODIUM (PORCINE) 5000 UNIT/ML IJ SOLN
5000.0000 [IU] | Freq: Three times a day (TID) | INTRAMUSCULAR | Status: DC
  Administered 2017-03-29 – 2017-03-30 (×4): 5000 [IU] via SUBCUTANEOUS
  Filled 2017-03-29 (×5): qty 1

## 2017-03-29 MED ORDER — LOSARTAN POTASSIUM 50 MG PO TABS
50.0000 mg | ORAL_TABLET | Freq: Every day | ORAL | Status: DC
  Administered 2017-03-30: 50 mg via ORAL
  Filled 2017-03-29 (×2): qty 1

## 2017-03-29 MED ORDER — TIMOLOL HEMIHYDRATE 0.5 % OP SOLN: 1.0000 [drp] | Freq: Every day | OPHTHALMIC | Status: DC

## 2017-03-29 MED ORDER — ATORVASTATIN CALCIUM 40 MG PO TABS
40.0000 mg | ORAL_TABLET | Freq: Every day | ORAL | Status: DC
  Administered 2017-03-30: 40 mg via ORAL
  Filled 2017-03-29 (×2): qty 1

## 2017-03-29 MED ORDER — SODIUM CHLORIDE 0.9 % IV SOLN
INTRAVENOUS | Status: DC
  Administered 2017-03-29 – 2017-03-30 (×3): via INTRAVENOUS

## 2017-03-29 MED ORDER — SODIUM CHLORIDE 0.9% FLUSH
3.0000 mL | Freq: Two times a day (BID) | INTRAVENOUS | Status: DC
  Administered 2017-03-29: 3 mL via INTRAVENOUS

## 2017-03-29 MED ORDER — GABAPENTIN 100 MG PO CAPS
200.0000 mg | ORAL_CAPSULE | Freq: Every day | ORAL | Status: DC
  Administered 2017-03-29: 200 mg via ORAL
  Filled 2017-03-29: qty 2

## 2017-03-29 MED ORDER — ALLOPURINOL 100 MG PO TABS
100.0000 mg | ORAL_TABLET | Freq: Every day | ORAL | Status: DC
  Administered 2017-03-29 – 2017-03-30 (×2): 100 mg via ORAL
  Filled 2017-03-29 (×2): qty 1

## 2017-03-29 MED ORDER — INSULIN GLARGINE 100 UNIT/ML ~~LOC~~ SOLN: 15.0000 [IU] | Freq: Every day | SUBCUTANEOUS | Status: DC

## 2017-03-29 MED ORDER — INSULIN ASPART 100 UNIT/ML ~~LOC~~ SOLN
0.0000 [IU] | Freq: Three times a day (TID) | SUBCUTANEOUS | Status: DC
  Administered 2017-03-29: 8 [IU] via SUBCUTANEOUS
  Administered 2017-03-30: 3 [IU] via SUBCUTANEOUS

## 2017-03-29 MED ORDER — INSULIN GLARGINE 100 UNIT/ML ~~LOC~~ SOLN
12.0000 [IU] | Freq: Every day | SUBCUTANEOUS | Status: DC
  Administered 2017-03-30: 12 [IU] via SUBCUTANEOUS
  Filled 2017-03-29 (×2): qty 0.12

## 2017-03-29 MED ORDER — LATANOPROST 0.005 % OP SOLN
1.0000 [drp] | Freq: Every day | OPHTHALMIC | Status: DC
  Administered 2017-03-29 – 2017-03-30 (×2): 1 [drp] via OPHTHALMIC
  Filled 2017-03-29: qty 2.5

## 2017-03-29 MED ORDER — TIMOLOL MALEATE 0.5 % OP SOLN
1.0000 [drp] | Freq: Every day | OPHTHALMIC | Status: DC
  Filled 2017-03-29 (×2): qty 5

## 2017-03-29 MED ORDER — PANTOPRAZOLE SODIUM 40 MG PO TBEC
40.0000 mg | DELAYED_RELEASE_TABLET | Freq: Every day | ORAL | Status: DC
  Administered 2017-03-30: 40 mg via ORAL
  Filled 2017-03-29 (×2): qty 1
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
## 2017-03-29 NOTE — ED Notes (Signed)
Admitting MD at bedside.
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
## 2017-03-29 NOTE — Progress Notes (Signed)
Family Medicine Teaching Service Daily Progress Note Intern Pager: [phone]  Patient name: [name] Medical record number: [id number] Date of birth: 1940-10-19 Age: 76 y.o. Gender: male  Primary Care Provider: Felipa Eth, MD Consultants: None Code Status: FULL  Pt Overview and Major Events to Date:  07/07 admitted to FPTS  Assessment and Plan: Tri Chittick is a 76 y.o. male presenting with hypoglycemia. PMH is significant for Type II DM, hx renal cancer, CAD, HTN, hx gout, GERD, BPH.   Hypoglycemia: CBG 31 on EMS arrival. Likely 2/2 insulin administration this AM without eating breakfast.  Received D10 by EMS prior to ED presentation though amount unclear. CBG on ED presentation 61,  improved to 100 on recheck one hour later without additional dextrose. UA with 50 glucose but no ketones. CBGs 116, 188 overnight.  - CBG qACHS - Resume home Lantus though at half dose of 12U qhs - Sliding scale insulin  AMS: Now resolved. Reports no recollection of events of the AM prior to ED presentation, with reports of AMS from friends this AM as well. Likely 2/2 hypoglycemia. WBC WNL at 10.5 and afebrile so infectious etiology less likely. UA also WNL, making UTI less likely cause. UDS positive for cocaine, which may have contributed, though patient reports using >12 hrs prior to episode. - Continue to monitor mental status  Bradycardia: Appears to be chronic issue, with last EKG at [facility] showing HR in 50s. Currently asymptomatic, though with reported dizziness yesterday. HR in ED in 30s-40s on telemetry. EKG in ED with sinus bradycardia, LBBB, and prolonged PR interval. Not currently on medications that would contribute to bradycardia. TSH WNL. HR between 59-87 overnight.  - Telemetry  Acute on chronic kidney disease: R partial nephrectomy performed 1-2 years ago for kidney cancer (patient unsure of specifics). Reportedly with recent renal CT and [place] at [facility] that showed no issues according to  his PCP. Not followed by nephrology. Not currently prescribed any nephrotoxic meds. Cr 2.3 on admission, up from baseline 1.2 per EHR records in 2012 and 2014. Cr 2.35 this AM.  - Avoid nephrotoxic meds - NS@125cc /hr - Continue to monitor  Type II DM with neuropathy: On Lantus 25U before breakfast at home. Last A1C in EHR 6.0 in 2012. CBG 61 on initial presentation, increased to 100 on recheck one hour later (see hypoglycemia above). CBGs 116, 188 overnight.  - Lantus 12U qhs - Moderate SSI - CBG qACHS - A1C pending - Continue home gabapentin  Substance abuse: UDS positive for cocaine. Patient reports only using once in his life about >12 hours prior to ED presentation. Says he does not intend to use again.  - Discuss dangers of cocaine use with patient again prior to discharge  HTN: On home losartan, and amlodipine. BP 131/57 and 145/70 overnight.  - Continue home losartan and amlodipine   GERD - Continue home Protonix  Hx gout - Continue home allopurinol  CAD - Continue home ASA, atorvastatin  HLD - Continue home atorvastatin  BPH: Previously taking terazosin but says this has been discontinued. Denies urinary retention or any issues with urination.  - Consider resuming terazosin if signs of urinary retention  Hx cataracts - Continue home timolol, latanoprost  FEN/GI: carb-modified diet, NS@125cc /hr Prophylaxis: heparin given elevated Cr  Disposition: home pending medical improvement  Subjective:  No acute events overnight. Patient feels well this AM and has no complaints.   Objective: Temp:  [97.9 F (36.6 C)-98.8 F (37.1 C)] 97.9 F (36.6 C) (07/08  0630) Pulse Rate:  [39-87] 55 (07/08 0630) Resp:  [11-18] 18 (07/08 0630) BP: (131-154)/(57-107) 131/57 (07/08 0630) SpO2:  [94 %-99 %] 94 % (07/08 0630) Weight:  [223 lb 11.2 oz (101.5 kg)-245 lb (111.1 kg)] 223 lb 11.2 oz (101.5 kg) (07/07 2133) Physical Exam: General: pleasant gentleman lying in bed  in NAD Cardiovascular: bradycardia, normal rhythm, no murmurs appreciated Respiratory: CTAB, normal WOB on RA, no wheezes Gastrointestinal: soft, non-tender, non-distended, +BS Neuro: A&Ox4 Psych: appropriate mood and affect  Laboratory:  Recent Labs Lab 03/29/17 1243 03/29/17 1300 03/30/17 0353  WBC 10.5  --  9.2  HGB 12.5* 12.2* 10.4*  HCT 37.9* 36.0* 31.8*  PLT 185  --  187    Recent Labs Lab 03/29/17 1243 03/29/17 1300 03/30/17 0353  NA 138 142 137  K 3.7 3.8 3.6  CL 109 107 108  CO2 24  --  22  BUN 27* 32* 35*  CREATININE 2.35* 2.30* 2.35*  CALCIUM 8.4*  --  8.3*  PROT 7.3  --   --   BILITOT 0.6  --   --   ALKPHOS 79  --   --   ALT 13*  --   --   AST 18  --   --   GLUCOSE 102* 97 77    Imaging/Diagnostic Tests: Dg Chest Portable 1 View  Result Date: 03/29/2017 CLINICAL DATA:  Syncope. EXAM: PORTABLE CHEST 1 VIEW COMPARISON:  12/28/2012 FINDINGS: 1205 hours. Lordotic position. The cardio pericardial silhouette is enlarged. The lungs are clear without focal pneumonia, edema, pneumothorax or pleural effusion. The visualized bony structures of the thorax are intact. Telemetry leads overlie the chest. IMPRESSION: No active disease. Electronically Signed   By: Misty Stanley M.D.   On: 03/29/2017 12:25   Verner Mould, MD 03/30/2017, 7:06 AM PGY-3, [place] Intern pager: [phone], text pages welcome

## 2017-03-29 NOTE — ED Notes (Signed)
3 IV team nurses at bedside.

## 2017-03-29 NOTE — ED Triage Notes (Signed)
To room via EMS.  Onset this morning pt woke up took meds, insulin, did not  Eat, drove several people home, Pt was noted to  Be swerving, friends call EMS.  BS 30, HR 39-42.  SB.  EMS tried multiple attempts to start IV.  EMS was able to get EJ, unknown D10 infused before IV infiltrated.  Pt drowsy.

## 2017-03-30 DIAGNOSIS — N289 Disorder of kidney and ureter, unspecified: Secondary | ICD-10-CM | POA: Diagnosis not present

## 2017-03-30 DIAGNOSIS — E162 Hypoglycemia, unspecified: Secondary | ICD-10-CM | POA: Diagnosis not present

## 2017-03-30 DIAGNOSIS — R001 Bradycardia, unspecified: Secondary | ICD-10-CM | POA: Diagnosis not present

## 2017-03-30 LAB — BASIC METABOLIC PANEL
ANION GAP: 7 (ref 5–15)
BUN: 35 mg/dL — ABNORMAL HIGH (ref 6–20)
CALCIUM: 8.3 mg/dL — AB (ref 8.9–10.3)
CO2: 22 mmol/L (ref 22–32)
CREATININE: 2.35 mg/dL — AB (ref 0.61–1.24)
Chloride: 108 mmol/L (ref 101–111)
GFR, EST AFRICAN AMERICAN: 30 mL/min — AB (ref >60)
GFR, EST NON AFRICAN AMERICAN: 26 mL/min — AB (ref >60)
Glucose, Bld: 77 mg/dL (ref 65–99)
Potassium: 3.6 mmol/L (ref 3.5–5.1)
Sodium: 137 mmol/L (ref 135–145)

## 2017-03-30 LAB — CBC
HCT: 31.8 % — ABNORMAL LOW (ref 39.0–52.0)
HEMOGLOBIN: 10.4 g/dL — AB (ref 13.0–17.0)
MCH: 28.3 pg (ref 26.0–34.0)
MCHC: 32.7 g/dL (ref 30.0–36.0)
MCV: 86.6 fL (ref 78.0–100.0)
PLATELETS: 187 10*3/uL (ref 150–400)
RBC: 3.67 MIL/uL — AB (ref 4.22–5.81)
RDW: 15.6 % — ABNORMAL HIGH (ref 11.5–15.5)
WBC: 9.2 10*3/uL (ref 4.0–10.5)

## 2017-03-30 LAB — GLUCOSE, CAPILLARY
Glucose-Capillary: 116 mg/dL — ABNORMAL HIGH (ref 65–99)
Glucose-Capillary: 123 mg/dL — ABNORMAL HIGH (ref 65–99)
Glucose-Capillary: 104 mg/dL — ABNORMAL HIGH (ref 65–99)
Glucose-Capillary: 162 mg/dL — ABNORMAL HIGH (ref 65–99)

## 2017-03-30 LAB — HEMOGLOBIN A1C
HEMOGLOBIN A1C: 5.6 % (ref 4.8–5.6)
MEAN PLASMA GLUCOSE: 114 mg/dL

## 2017-03-30 MED ORDER — ATORVASTATIN CALCIUM 40 MG PO TABS: 40.0000 mg | ORAL_TABLET | Freq: Every day | ORAL | Status: DC

## 2017-03-30 MED ORDER — INSULIN GLARGINE 100 UNIT/ML ~~LOC~~ SOLN: 15.0000 [IU] | Freq: Every morning | SUBCUTANEOUS | 11 refills | Status: DC

## 2017-03-30 MED ORDER — GABAPENTIN 100 MG PO CAPS: 200.0000 mg | ORAL_CAPSULE | Freq: Every day | ORAL | Status: DC

## 2017-03-30 MED ORDER — PANTOPRAZOLE SODIUM 40 MG PO TBEC: 40.0000 mg | DELAYED_RELEASE_TABLET | Freq: Every day | ORAL | Status: DC

## 2018-06-10 ENCOUNTER — Encounter: Payer: Self-pay | Admitting: Radiation Oncology

## 2018-06-21 ENCOUNTER — Emergency Department (HOSPITAL_COMMUNITY)
Admission: EM | Admit: 2018-06-21 | Discharge: 2018-06-21 | Disposition: A | Discharge status: Home / Self Care | Payer: Medicare Other | Attending: Emergency Medicine | Admitting: Emergency Medicine

## 2018-06-21 ENCOUNTER — Emergency Department (HOSPITAL_BASED_OUTPATIENT_CLINIC_OR_DEPARTMENT_OTHER): Payer: Medicare Other

## 2018-06-21 ENCOUNTER — Encounter (HOSPITAL_COMMUNITY): Payer: Self-pay

## 2018-06-21 ENCOUNTER — Other Ambulatory Visit: Payer: Self-pay

## 2018-06-21 DIAGNOSIS — Z79899 Other long term (current) drug therapy: Secondary | ICD-10-CM | POA: Insufficient documentation

## 2018-06-21 DIAGNOSIS — Z794 Long term (current) use of insulin: Secondary | ICD-10-CM | POA: Insufficient documentation

## 2018-06-21 DIAGNOSIS — R600 Localized edema: Secondary | ICD-10-CM

## 2018-06-21 DIAGNOSIS — I129 Hypertensive chronic kidney disease with stage 1 through stage 4 chronic kidney disease, or unspecified chronic kidney disease: Secondary | ICD-10-CM | POA: Diagnosis not present

## 2018-06-21 DIAGNOSIS — Z91040 Latex allergy status: Secondary | ICD-10-CM | POA: Diagnosis not present

## 2018-06-21 DIAGNOSIS — F17210 Nicotine dependence, cigarettes, uncomplicated: Secondary | ICD-10-CM | POA: Insufficient documentation

## 2018-06-21 DIAGNOSIS — Z7982 Long term (current) use of aspirin: Secondary | ICD-10-CM | POA: Diagnosis not present

## 2018-06-21 DIAGNOSIS — I82441 Acute embolism and thrombosis of right tibial vein: Secondary | ICD-10-CM | POA: Diagnosis not present

## 2018-06-21 DIAGNOSIS — N189 Chronic kidney disease, unspecified: Secondary | ICD-10-CM

## 2018-06-21 DIAGNOSIS — M79609 Pain in unspecified limb: Secondary | ICD-10-CM

## 2018-06-21 DIAGNOSIS — M7989 Other specified soft tissue disorders: Secondary | ICD-10-CM | POA: Diagnosis not present

## 2018-06-21 DIAGNOSIS — E1122 Type 2 diabetes mellitus with diabetic chronic kidney disease: Secondary | ICD-10-CM | POA: Insufficient documentation

## 2018-06-21 DIAGNOSIS — I2510 Atherosclerotic heart disease of native coronary artery without angina pectoris: Secondary | ICD-10-CM | POA: Diagnosis not present

## 2018-06-21 DIAGNOSIS — R2243 Localized swelling, mass and lump, lower limb, bilateral: Secondary | ICD-10-CM | POA: Diagnosis present

## 2018-06-21 LAB — CBC
HCT: 33 % — ABNORMAL LOW (ref 39.0–52.0)
HEMOGLOBIN: 11.2 g/dL — AB (ref 13.0–17.0)
MCH: 30.3 pg (ref 26.0–34.0)
MCHC: 33.9 g/dL (ref 30.0–36.0)
MCV: 89.2 fL (ref 78.0–100.0)
Platelets: 178 10*3/uL (ref 150–400)
RBC: 3.7 MIL/uL — ABNORMAL LOW (ref 4.22–5.81)
RDW: 15.1 % (ref 11.5–15.5)
WBC: 6.9 10*3/uL (ref 4.0–10.5)

## 2018-06-21 LAB — COMPREHENSIVE METABOLIC PANEL
ALK PHOS: 74 U/L (ref 38–126)
ALT: 12 U/L (ref 0–44)
ANION GAP: 8 (ref 5–15)
AST: 17 U/L (ref 15–41)
Albumin: 3.3 g/dL — ABNORMAL LOW (ref 3.5–5.0)
BILIRUBIN TOTAL: 0.4 mg/dL (ref 0.3–1.2)
BUN: 26 mg/dL — ABNORMAL HIGH (ref 8–23)
CALCIUM: 8.3 mg/dL — AB (ref 8.9–10.3)
CO2: 23 mmol/L (ref 22–32)
Chloride: 110 mmol/L (ref 98–111)
Creatinine, Ser: 3.14 mg/dL — ABNORMAL HIGH (ref 0.61–1.24)
GFR, EST AFRICAN AMERICAN: 21 mL/min — AB (ref >60)
GFR, EST NON AFRICAN AMERICAN: 18 mL/min — AB (ref >60)
Glucose, Bld: 100 mg/dL — ABNORMAL HIGH (ref 70–99)
Potassium: 4.5 mmol/L (ref 3.5–5.1)
SODIUM: 141 mmol/L (ref 135–145)
Total Protein: 7.4 g/dL (ref 6.5–8.1)

## 2018-06-21 LAB — URINALYSIS, ROUTINE W REFLEX MICROSCOPIC
BACTERIA UA: NONE SEEN
BILIRUBIN URINE: NEGATIVE
Glucose, UA: 50 mg/dL — AB
Ketones, ur: NEGATIVE mg/dL
LEUKOCYTES UA: NEGATIVE
NITRITE: NEGATIVE
Protein, ur: >=300 mg/dL — AB
SPECIFIC GRAVITY, URINE: 1.017 (ref 1.005–1.030)
pH: 5 (ref 5.0–8.0)

## 2018-06-21 MED ORDER — ENOXAPARIN SODIUM 100 MG/ML ~~LOC~~ SOLN
1.0000 mg/kg | Freq: Once | SUBCUTANEOUS | Status: AC
  Administered 2018-06-21: 95 mg via SUBCUTANEOUS
  Filled 2018-06-21: qty 0.95

## 2018-06-21 MED ORDER — ENOXAPARIN SODIUM 40 MG/0.4ML ~~LOC~~ SOLN: 1.0000 mg/kg | SUBCUTANEOUS | 0 refills | Status: DC

## 2018-06-21 MED ORDER — WARFARIN SODIUM 10 MG PO TABS
10.0000 mg | ORAL_TABLET | Freq: Once | ORAL | Status: AC
  Administered 2018-06-21: 10 mg via ORAL
  Filled 2018-06-21: qty 1

## 2018-06-21 MED ORDER — WARFARIN - PHYSICIAN DOSING INPATIENT: Freq: Every day | Status: DC

## 2018-06-21 MED ORDER — WARFARIN SODIUM 5 MG PO TABS: 10.0000 mg | ORAL_TABLET | Freq: Every day | ORAL | 0 refills | Status: DC

## 2018-06-21 NOTE — ED Triage Notes (Signed)
Patient c/o bilateral foot pain and swelling x 2 days. L>R.

## 2018-06-21 NOTE — ED Provider Notes (Signed)
Medical screening examination/treatment/procedure(s) were conducted as a shared visit with non-physician practitioner(s) and myself.  I personally evaluated the patient during the encounter.  None 77 year old male presents with bilateral lower extremity pain left greater than right.  Ultrasound shows DVT in the left side.  Some will treat with medications so patient will be treated for his DVT and given follow-up   Lacretia Leigh, MD 06/21/18 1702

## 2018-06-21 NOTE — ED Provider Notes (Signed)
[facility] DEPT Provider Note   CSN: [id number] Arrival date & time: 06/21/18  1200     History   Chief Complaint Chief Complaint  Patient presents with  . Foot Pain  . Foot Swelling    HPI [name] is a 77 y.o. male.  Patient with history of recently diagnosed prostate cancer, chronic kidney disease, [facility] patient, diabetes --presents to the emergency department with acute onset of bilateral lower extremity swelling, left greater than right, starting about 2 days ago.  Patient first noticed his left foot and ankle swell.  This morning when he woke up his right foot was swollen as well.  He states that his left foot has improved somewhat.  He denies associated pain.  No chest pain or shortness of breath, worsening exertional dyspnea.  No redness in the swollen area.  He states that the symptoms seem to be a bit worse in the mornings.  He continues to have very good urinary output.  He has had discussions of beginning dialysis with his nephrologist who he sees every 3 months.  States that recently has kidney function has been stable.  He has been on amlodipine for a long time.  Denies any history of heart failure.  No history of liver problems.  No recent prolonged sitting or standing, surgery.     Past Medical History:  Diagnosis Date  . Cancer ([place])   . Chronic kidney disease    renal insufficiency  . Coronary artery disease   . Diabetes mellitus    type 2-non-insulin  . GERD (gastroesophageal reflux disease)   . Gout   . Hypertension   . Obesity   . Tobacco abuse counseling     Patient Active Problem List   Diagnosis Date Noted  . Hypoglycemia 03/29/2017  . Coronary artery disease   . Hypertension   . Diabetes mellitus   . Chronic kidney disease     Past Surgical History:  Procedure Laterality Date  . CARDIAC CATHETERIZATION  02/08/02  . CARDIAC CATHETERIZATION  11/20/10   drug eluting stent;mid left LAD, There is mild  hypokinesis of the anterolateral wall. LVEF  is estimated at 50-55%  . INSERTION OF BRONCHIAL STENT     went in groin to R chest  . PENILE PROSTHESIS IMPLANT     '05        Home Medications    Prior to Admission medications   Medication Sig Start Date End Date Taking? Authorizing Provider  albuterol (PROVENTIL HFA;VENTOLIN HFA) 108 (90 BASE) MCG/ACT inhaler Inhale 1 puff into the lungs every 6 (six) hours as needed for wheezing or shortness of breath.    [provider]  allopurinol (ZYLOPRIM) 100 MG tablet Take 100 mg by mouth daily.      [provider]  amLODipine (NORVASC) 10 MG tablet Take 10 mg by mouth daily.      [provider]  aspirin EC 81 MG tablet Take 81 mg by mouth daily.    [provider]  atorvastatin (LIPITOR) 40 MG tablet Take 1 tablet (40 mg total) by mouth daily at 6 PM. 03/30/17   Rogue Bussing, MD  cholecalciferol (VITAMIN D) 1000 UNITS tablet Take 1,000 Units by mouth daily.    [provider]  gabapentin (NEURONTIN) 100 MG capsule Take 2 capsules (200 mg total) by mouth at bedtime. 03/30/17   Rogue Bussing, MD  insulin glargine (LANTUS) 100 UNIT/ML injection Inject 0.15 mLs (15 Units total) into  the skin every morning. 03/30/17   Rogue Bussing, MD  latanoprost (XALATAN) 0.005 % ophthalmic solution Place 1 drop into both eyes at bedtime.    [provider]  losartan (COZAAR) 50 MG tablet Take 50 mg by mouth daily.    [provider]  nitroGLYCERIN (NITROSTAT) 0.4 MG SL tablet Place 0.4 mg under the tongue every 5 (five) minutes as needed. For chest pain    [provider]  omeprazole (PRILOSEC) 20 MG capsule Take 20 mg by mouth daily.    [provider]  pantoprazole (PROTONIX) 40 MG tablet Take 1 tablet (40 mg total) by mouth daily. 03/30/17   Rogue Bussing, MD  terazosin (HYTRIN) 5 MG capsule Take 5 mg by mouth every morning.    [provider]  timolol (BETIMOL) 0.5 % ophthalmic solution Place 1 drop into both eyes daily.    [provider]    Family History Family History  Problem Relation Age of Onset  . Aneurysm Mother   . Aneurysm Father   . Cancer Brother   . Cancer Other     Social History Social History   Tobacco Use  . Smoking status: Current Every Day Smoker    Packs/day: 0.50    Types: Cigarettes  . Smokeless tobacco: Never Used  Substance Use Topics  . Alcohol use: Not Currently    Comment: drank 5 beers 03-28-17  . Drug use: No     Allergies   Alpha blocker quinazolines and Latex   Review of Systems Review of Systems  Constitutional: Negative for fever.  HENT: Negative for rhinorrhea and sore throat.   Eyes: Negative for redness.  Respiratory: Negative for cough and shortness of breath.   Cardiovascular: Positive for leg swelling. Negative for chest pain.  Gastrointestinal: Negative for abdominal pain, diarrhea, nausea and vomiting.  Genitourinary: Negative for decreased urine volume and dysuria.  Musculoskeletal: Negative for myalgias.  Skin: Negative for rash.  Neurological: Negative for headaches.     Physical Exam Updated Vital Signs BP (!) 142/92 (BP Location: Right Arm)   Pulse 61   Temp 98 F (36.7 C) (Oral)   Resp 16   Ht 5\' 7"  (1.702 m)   Wt 93.9 kg   SpO2 97%   BMI 32.42 kg/m   Physical Exam  Constitutional: He appears well-developed and well-nourished.  HENT:  Head: Normocephalic and atraumatic.  Eyes: Conjunctivae are normal. Right eye exhibits no discharge. Left eye exhibits no discharge.  Neck: Normal range of motion. Neck supple.  Cardiovascular: Normal rate, regular rhythm and normal heart sounds.  Pulmonary/Chest: Effort normal and breath sounds normal.  Abdominal: Soft. There is no tenderness. There is no guarding.  Musculoskeletal: He exhibits edema. He exhibits no tenderness.  Patient with 1+ edema of the bilateral feet and ankles.    Neurological: He is alert.  Skin: Skin is warm and dry.  Psychiatric: He has a normal mood and affect.  Nursing note and vitals reviewed.    ED Treatments / Results  Labs (all labs ordered are listed, but only abnormal results are displayed) Labs Reviewed  CBC - Abnormal; Notable for the following components:      Result Value   RBC 3.70 (*)    Hemoglobin 11.2 (*)    HCT 33.0 (*)    All other components within normal limits  COMPREHENSIVE METABOLIC PANEL - Abnormal; Notable for the following components:   Glucose, Bld 100 (*)    BUN 26 (*)  Creatinine, Ser 3.14 (*)    Calcium 8.3 (*)    Albumin 3.3 (*)    GFR calc non Af Amer 18 (*)    GFR calc Af Amer 21 (*)    All other components within normal limits  URINALYSIS, ROUTINE W REFLEX MICROSCOPIC - Abnormal; Notable for the following components:   Glucose, UA 50 (*)    Hgb urine dipstick SMALL (*)    Protein, ur >=300 (*)    All other components within normal limits    EKG None  Radiology No results found.  Procedures Procedures (including critical care time)  Medications Ordered in ED Medications  Warfarin - Physician Dosing Inpatient (has no administration in time range)  enoxaparin (LOVENOX) injection 95 mg (95 mg Subcutaneous Given 06/21/18 1922)  warfarin (COUMADIN) tablet 10 mg (10 mg Oral Given 06/21/18 1922)     Initial Impression / Assessment and Plan / ED Course  I have reviewed the triage vital signs and the nursing notes.  Pertinent labs & imaging results that were available during my care of the patient were reviewed by me and considered in my medical decision making (see chart for details).     Patient seen and examined. Work-up initiated.   Vital signs reviewed and are as follows: BP (!) 142/92 (BP Location: Right Arm)   Pulse 61   Temp 98 F (36.7 C) (Oral)   Resp 16   Ht 5\' 7"  (1.702 m)   Wt 93.9 kg   SpO2 97%   BMI 32.42 kg/m   Doppler ultrasound demonstrates right tibial vein  DVT.  Patient with known chronic kidney disease, creatinine is 3.14 today.  Protein noted in the urine.  Patient is followed by a nephrologist at the [place].  I discussed anticoagulation with pharmacy.  Decision made to start on Lovenox and bridged to Coumadin.  Discussed this with patient.  Discussed bleeding risk and expected side effects of anticoagulation and need to follow-up with any kind of trauma, headache, rectal bleeding, or other concerns.  Patient strongly encouraged to call his doctor tomorrow to schedule follow-up and medication management.  Final Clinical Impressions(s) / ED Diagnoses   Final diagnoses:  Acute deep vein thrombosis (DVT) of tibial vein of right lower extremity (HCC)  Bilateral lower extremity edema  Chronic kidney disease, unspecified CKD stage   Patient with bilateral lower extremity edema over the past several days.  Patient was found to have a right tibial vein DVT.  Patient with chronic kidney disease however reportedly stable.  Cannot entirely rule out progression of CKD, however patient can use to have excellent urine output.  Do not suspect liver failure or congestive heart failure.  Patient is also on a calcium channel blocker which may contribute to his edema.  Patient was started on Lovenox bridge to Coumadin given his chronic kidney disease, unable to take NOACs.  Patient has taken insulin in the past and should be able to administer Lovenox at home.  Discussed need for very close PCP follow-up as above.  ED Discharge Orders         Ordered    enoxaparin (LOVENOX) 40 MG/0.4ML injection  Every 24 hours     06/21/18 1934    warfarin (COUMADIN) 5 MG tablet  Daily     06/21/18 1934           Suann Larry 06/21/18 2034    Lacretia Leigh, MD 06/22/18 [phone]

## 2018-06-21 NOTE — Progress Notes (Signed)
*  Preliminary Results* Bilateral lower extremity venous duplex completed. The right lower extremity is positive for acute deep vein thrombosis involving the right posterior tibial veins. The left lower extremity is negative for deep vein thrombosis. There is no evidence of Baker's cyst bilaterally.  06/21/2018 4:37 PM Maudry Mayhew, MHA, RVT, RDCS, RDMS

## 2018-06-21 NOTE — Discharge Instructions (Signed)
Please read and follow all provided instructions.  Your diagnoses today include:  1. Acute deep vein thrombosis (DVT) of tibial vein of right lower extremity (HCC)   2. Bilateral lower extremity edema   3. Chronic kidney disease, unspecified CKD stage     Tests performed today include:  Blood counts and electrolytes - shows weak kidney function  Liver tests - no problems  Urine test - protein noted in urine  An ultrasound of your leg that shows a blood clot  Vital signs. See below for your results today.   Medications prescribed:   Lovenox (enoxaparin) - blood thinner shot   Coumadin (warfarin) - blood thinner pill  Take any prescribed medications only as directed.  Home care instructions:  Follow any educational materials contained in this packet.  Use injections as taught to you by the Emergency Department nurse today.   BE VERY CAREFUL not to take multiple medicines containing Tylenol (also called acetaminophen). Doing so can lead to an overdose which can damage your liver and cause liver failure and possibly death.   Follow-up instructions: Please follow-up with your primary care provider in the next 1-2 days for further evaluation of your symptoms. This is very important for the management of your medications.   Return instructions:   Please return to the Emergency Department if you experience worsening symptoms.  Return immediately if you develop chest pain, shortness of breath, dizziness or fainting.  Return with new color change or weakness/numbness to your affected leg(s).  Return with bleeding, severe headache, confusion or altered mental status.  Please return if you have any other emergent concerns.  Additional Information:  Your vital signs today were: BP (!) 149/90    Pulse (!) 53    Temp 98 F (36.7 C) (Oral)    Resp 18    Ht 5\' 7"  (1.702 m)    Wt 93.9 kg    SpO2 99%    BMI 32.42 kg/m  If your blood pressure (BP) was elevated above 135/85 this  visit, please have this repeated by your doctor within one month. --------------

## 2018-06-30 ENCOUNTER — Encounter: Payer: Self-pay | Admitting: Radiation Oncology

## 2018-06-30 NOTE — Progress Notes (Addendum)
GU Location of Tumor / Histology: prostatic adenocarcinoma  If Prostate Cancer, Gleason Score is (4 + 5) and PSA is (16.5)  [name] reports that Dr. Humphries at the [facility] told him his PSA was 16 and referred him to [facility] and Dr. Manning. Patient reports he was evaluated yesterday at [facility].  Biopsies of prostate (if applicable) revealed:    Past/Anticipated interventions by urology, if any: TURP, referral to radiation oncology. Patient denies receiving ADT.  Past/Anticipated interventions by medical oncology, if any: no  Weight changes, if any: no  Bowel/Bladder complaints, if any: Denies dysuria, hematuria, urinary leakage or incontinence. IPSS 17. SHIM 25.  Nausea/Vomiting, if any: no  Pain issues, if any:  no  SAFETY ISSUES:  Prior radiation? no  Pacemaker/ICD? no  Possible current pregnancy? no  Is the patient on methotrexate? no  Current Complaints / other details:  77 year old male. Positive family hx of prostate ca in both brothers. Mother with history of multiple myeloma. Mother and father died of brain aneurysm. Resides in [place]. Treated for DVT last week.    

## 2018-07-01 ENCOUNTER — Ambulatory Visit
Admission: RE | Admit: 2018-07-01 | Discharge: 2018-07-01 | Disposition: A | Discharge status: Home / Self Care | Payer: No Typology Code available for payment source | Source: Ambulatory Visit | Attending: Radiation Oncology | Admitting: Radiation Oncology

## 2018-07-01 ENCOUNTER — Encounter: Payer: Self-pay | Admitting: Radiation Oncology

## 2018-07-01 ENCOUNTER — Other Ambulatory Visit: Payer: Self-pay | Admitting: Urology

## 2018-07-01 ENCOUNTER — Other Ambulatory Visit: Payer: Self-pay

## 2018-07-01 ENCOUNTER — Encounter

## 2018-07-01 VITALS — BP 159/88 | HR 61 | Temp 97.7°F | Resp 20 | Ht 67.0 in | Wt 211.4 lb

## 2018-07-01 DIAGNOSIS — N189 Chronic kidney disease, unspecified: Secondary | ICD-10-CM | POA: Diagnosis not present

## 2018-07-01 DIAGNOSIS — Z7901 Long term (current) use of anticoagulants: Secondary | ICD-10-CM | POA: Diagnosis not present

## 2018-07-01 DIAGNOSIS — Z79899 Other long term (current) drug therapy: Secondary | ICD-10-CM | POA: Insufficient documentation

## 2018-07-01 DIAGNOSIS — F17210 Nicotine dependence, cigarettes, uncomplicated: Secondary | ICD-10-CM | POA: Diagnosis not present

## 2018-07-01 DIAGNOSIS — I2510 Atherosclerotic heart disease of native coronary artery without angina pectoris: Secondary | ICD-10-CM | POA: Diagnosis not present

## 2018-07-01 DIAGNOSIS — Z794 Long term (current) use of insulin: Secondary | ICD-10-CM | POA: Diagnosis not present

## 2018-07-01 DIAGNOSIS — Z7982 Long term (current) use of aspirin: Secondary | ICD-10-CM | POA: Diagnosis not present

## 2018-07-01 DIAGNOSIS — C61 Malignant neoplasm of prostate: Secondary | ICD-10-CM

## 2018-07-01 DIAGNOSIS — E1122 Type 2 diabetes mellitus with diabetic chronic kidney disease: Secondary | ICD-10-CM | POA: Diagnosis not present

## 2018-07-01 DIAGNOSIS — I129 Hypertensive chronic kidney disease with stage 1 through stage 4 chronic kidney disease, or unspecified chronic kidney disease: Secondary | ICD-10-CM | POA: Insufficient documentation

## 2018-07-01 HISTORY — DX: Malignant neoplasm of prostate: C61

## 2018-07-01 NOTE — Progress Notes (Signed)
See progress note under physician encounter. 

## 2018-07-01 NOTE — Progress Notes (Signed)
Radiation Oncology         [phone] ________________________________  Initial outpatient Consultation  Name: [name] MRN: [id number]  Date: 07/01/2018  DOB: 1940-11-22  [id number], [name], MD  [name], MD   REFERRING PHYSICIAN: [name], MD [name], MD  DIAGNOSIS: 77 y.o. gentleman with Gleason Score of 4+5 adenocarcinoma of the prostate with PSA of 16.5.    ICD-10-CM   1. Malignant neoplasm of prostate ([place]) C61     HISTORY OF PRESENT ILLNESS: [name] is a 77 y.o. male with a diagnosis of prostate cancer. He was noted to have an elevated PSA of 16.5 by his urologist at the [facility], Dr. [name].  A previous PSA in January 2019 was 15.3.  The patient proceeded to transrectal ultrasound with 23 biopsies of the prostate on 05/15/18.  Out of 23 core biopsies, 11 were positive.  The maximum Gleason score was 4+5, and this was seen in 9 out of 11 cores in the right lobe prostate.  Additionally, there was Gleason 3+4 disease in 2 of 12 cores from the left lobe of the prostate.  Patient states that he was referred to [facility] and saw Dr. [name] yesterday, 10/8. He reports that they recommended he proceed with ADT alone instead of radiotherapy. Dr. [name] has ordered a bone scan to be performed on 07/15/18.  He has not yet started ADT.  The patient reviewed the biopsy results with his urologist and he has kindly been referred today for discussion of potential radiation treatment options.  Of note, the patient does have a past medical history significant for coronary artery disease, chronic renal insufficiency, type 2 diabetes and substance abuse disorder.  PREVIOUS RADIATION THERAPY: No  PAST MEDICAL HISTORY:  Past Medical History:  Diagnosis Date  . Chronic kidney disease    renal insufficiency  . Coronary artery disease   . Diabetes mellitus    type 2-non-insulin  . GERD (gastroesophageal reflux disease)   . Gout   . Hypertension   . Obesity   .  Prostate cancer ([place])   . Tobacco abuse counseling       PAST SURGICAL HISTORY: Past Surgical History:  Procedure Laterality Date  . CARDIAC CATHETERIZATION  02/08/02  . CARDIAC CATHETERIZATION  11/20/10   drug eluting stent;mid left LAD, There is mild hypokinesis of the anterolateral wall. LVEF  is estimated at 50-55%  . INSERTION OF BRONCHIAL STENT     went in groin to R chest  . PENILE PROSTHESIS IMPLANT     '05    FAMILY HISTORY:  Family History  Problem Relation Age of Onset  . Aneurysm Mother   . Aneurysm Father   . Cancer Brother   . Cancer Other     SOCIAL HISTORY:  Social History   Socioeconomic History  . Marital status: Widowed    Spouse name: Not on file  . Number of children: 3  . Years of education: Not on file  . Highest education level: Not on file  Occupational History  . Occupation: retired  Scientific laboratory technician  . Financial resource strain: Not on file  . Food insecurity:    Worry: Not on file    Inability: Not on file  . Transportation needs:    Medical: Not on file    Non-medical: Not on file  Tobacco Use  . Smoking status: Current Every Day Smoker    Packs/day: 0.50    Years: 50.00    Pack years: 25.00  Types: Cigarettes  . Smokeless tobacco: Never Used  Substance and Sexual Activity  . Alcohol use: Not Currently    Comment: drank 5 beers 03-28-17  . Drug use: No  . Sexual activity: Not on file  Lifestyle  . Physical activity:    Days per week: Not on file    Minutes per session: Not on file  . Stress: Not on file  Relationships  . Social connections:    Talks on phone: Not on file    Gets together: Not on file    Attends religious service: Not on file    Active member of club or organization: Not on file    Attends meetings of clubs or organizations: Not on file    Relationship status: Not on file  . Intimate partner violence:    Fear of current or ex partner: Not on file    Emotionally abused: Not on file    Physically abused:  Not on file    Forced sexual activity: Not on file  Other Topics Concern  . Not on file  Social History Narrative  . Not on file    ALLERGIES: Alpha blocker quinazolines and Latex  MEDICATIONS:  Current Outpatient Medications  Medication Sig Dispense Refill  . albuterol (PROVENTIL HFA;VENTOLIN HFA) 108 (90 BASE) MCG/ACT inhaler Inhale 1 puff into the lungs every 6 (six) hours as needed for wheezing or shortness of breath.    . allopurinol (ZYLOPRIM) 100 MG tablet Take 100 mg by mouth daily.      Marland Kitchen amLODipine (NORVASC) 10 MG tablet Take 5 mg by mouth daily.     Marland Kitchen aspirin EC 81 MG tablet Take 81 mg by mouth daily.    Marland Kitchen atorvastatin (LIPITOR) 40 MG tablet Take 1 tablet (40 mg total) by mouth daily at 6 PM. (Patient taking differently: Take 40 mg by mouth daily. )    . cholecalciferol (VITAMIN D) 1000 UNITS tablet Take 1,000 Units by mouth daily.    Marland Kitchen enoxaparin (LOVENOX) 40 MG/0.4ML injection Inject 0.95 mLs (95 mg total) into the skin daily. 5 Syringe 0  . gabapentin (NEURONTIN) 100 MG capsule Take 2 capsules (200 mg total) by mouth at bedtime. (Patient taking differently: Take 100 mg by mouth at bedtime. )    . glipiZIDE (GLUCOTROL) 10 MG tablet Take by mouth.    . hydrochlorothiazide (HYDRODIURIL) 25 MG tablet Take by mouth.    . insulin glargine (LANTUS) 100 UNIT/ML injection Inject into the skin.    Marland Kitchen isosorbide dinitrate (ISORDIL) 10 MG tablet Take by mouth.    . latanoprost (XALATAN) 0.005 % ophthalmic solution Place 1 drop into both eyes at bedtime.    Marland Kitchen losartan (COZAAR) 50 MG tablet Take 50 mg by mouth daily.    . metFORMIN (GLUCOPHAGE) 850 MG tablet Take by mouth.    . nitroGLYCERIN (NITROSTAT) 0.4 MG SL tablet Place 0.4 mg under the tongue every 5 (five) minutes as needed. For chest pain    . omeprazole (PRILOSEC) 20 MG capsule Take 20 mg by mouth daily.    . ranitidine (ZANTAC) 150 MG tablet Take by mouth.    . simvastatin (ZOCOR) 80 MG tablet Take by mouth.    . terazosin  (HYTRIN) 5 MG capsule Take 5 mg by mouth every morning.    . timolol (BETIMOL) 0.5 % ophthalmic solution Place 1 drop into both eyes daily.    Marland Kitchen warfarin (COUMADIN) 5 MG tablet Take 2 tablets (10 mg total) by mouth daily.  12 tablet 0   No current facility-administered medications for this encounter.     REVIEW OF SYSTEMS:  On review of systems, the patient reports that he is doing well overall. He denies any chest pain, shortness of breath, cough, fevers, chills, night sweats, unintended weight changes. He denies any bowel disturbances, and denies abdominal pain, nausea or vomiting. He reports constipation. He denies any new musculoskeletal or joint aches or pains. His IPSS was 17, indicating moderate urinary symptoms. He is able to complete sexual activity with most to all attempts with use of his implanted penile prosthesis. A complete review of systems is obtained and is otherwise negative.  PHYSICAL EXAM:  Wt Readings from Last 3 Encounters:  07/01/18 211 lb 6.4 oz (95.9 kg)  06/21/18 207 lb (93.9 kg)  03/29/17 223 lb 11.2 oz (101.5 kg)   Temp Readings from Last 3 Encounters:  07/01/18 97.7 F (36.5 C) (Oral)  06/21/18 98 F (36.7 C) (Oral)  03/30/17 98.7 F (37.1 C) (Oral)   BP Readings from Last 3 Encounters:  07/01/18 (!) 159/88  06/21/18 (!) 177/96  03/30/17 (!) 149/74   Pulse Readings from Last 3 Encounters:  07/01/18 61  06/21/18 (!) 55  03/30/17 (!) 57   Pain Assessment Pain Score: 0-No pain/10  In general, this is a well appearing African American gentleman in no acute distress. He is alert and oriented x4 and appropriate throughout the examination. HEENT reveals that the patient is normocephalic, atraumatic. EOMs are intact. PERRLA. Skin is intact without any evidence of gross lesions. Cardiovascular exam reveals a regular rate and rhythm, no clicks rubs or murmurs are auscultated. Chest is clear to auscultation bilaterally. Lymphatic assessment is performed and does  not reveal any adenopathy in the cervical, supraclavicular, axillary, or inguinal chains. Abdomen has active bowel sounds in all quadrants and is intact. The abdomen is soft, non tender, non distended. Lower extremities are negative for pretibial pitting edema, deep calf tenderness, cyanosis or clubbing.  KPS = 100  100 - Normal; no complaints; no evidence of disease. 90   - Able to carry on normal activity; minor signs or symptoms of disease. 80   - Normal activity with effort; some signs or symptoms of disease. 51   - Cares for self; unable to carry on normal activity or to do active work. 60   - Requires occasional assistance, but is able to care for most of his personal needs. 50   - Requires considerable assistance and frequent medical care. 48   - Disabled; requires special care and assistance. 17   - Severely disabled; hospital admission is indicated although death not imminent. 20   - Very sick; hospital admission necessary; active supportive treatment necessary. 10   - Moribund; fatal processes progressing rapidly. 0     - Dead  Karnofsky DA, Abelmann [place], Craver LS and Burchenal [facility] [phone]) The use of the nitrogen mustards in the palliative treatment of carcinoma: with particular reference to bronchogenic carcinoma Cancer 1 634-56  LABORATORY DATA:  Lab Results  Component Value Date   WBC 6.9 06/21/2018   HGB 11.2 (L) 06/21/2018   HCT 33.0 (L) 06/21/2018   MCV 89.2 06/21/2018   PLT 178 06/21/2018   Lab Results  Component Value Date   NA 141 06/21/2018   K 4.5 06/21/2018   CL 110 06/21/2018   CO2 23 06/21/2018   Lab Results  Component Value Date   ALT 12 06/21/2018   AST 17 06/21/2018  ALKPHOS 74 06/21/2018   BILITOT 0.4 06/21/2018     RADIOGRAPHY: No results found.    IMPRESSION/PLAN: 1. 77 y.o. gentleman with Gleason Score of 4+5 adenocarcinoma of the prostate with PSA of 16.5. We discussed the patient's workup and outlined the nature of prostate cancer in this  setting. The patient's T stage, Gleason's score, and PSA put him into the high risk group. Accordingly, he is eligible for a variety of potential treatment options including LT ADT alone or LT-ADT in combination with 8 weeks of external radiation pending there is no evidence for distant metastases on upcoming bone scan scheduled for 07/15/18. We discussed the available radiation techniques, and focused on the details and logistics and delivery. We discussed and outlined the risks, benefits, short and long-term effects associated with radiotherapy and compared and contrasted these with prostatectomy. We also detailed the role of ADT in the treatment of prostate cancer and outlined the associated side effects that could be expected with this therapy.  The recommendation is to proceed with hormone therapy now. The patient is a very difficult blood draw, therefore, we will place orders CMP and PSA to be performed in the hospital, first available and will forward results to Dr. [name]. We will proceed with treatment planning accordingly once we have the results from upcoming bone scan for disease staging. If local disease only, we would offer 8 weeks of daily radiotherapy in combination with LT-ADT.  Radiation treatments would start approximately 8 weeks after initiation of ADT.  He appears to have a good understanding of his disease and our recommendations and is in agreement with the stated plan.    Nicholos Johns, PA-C    Tyler Pita, MD  [facility] Direct Dial: [phone]  Fax: [phone] [email or web address]  Skype  [email or web address]  This document serves as a record of services personally performed by Tyler Pita, MD and Freeman Caldron, PA-C. It was created on their behalf by Wilburn Mylar, a trained medical scribe. The creation of this record is based on the scribe's personal observations and the provider's statements to them. This document has been checked and approved by  the attending provider.

## 2018-07-02 ENCOUNTER — Other Ambulatory Visit: Payer: Self-pay | Admitting: Urology

## 2018-07-02 DIAGNOSIS — N189 Chronic kidney disease, unspecified: Secondary | ICD-10-CM

## 2018-07-02 DIAGNOSIS — C61 Malignant neoplasm of prostate: Secondary | ICD-10-CM

## 2018-07-03 ENCOUNTER — Ambulatory Visit
Admission: RE | Admit: 2018-07-03 | Discharge: 2018-07-03 | Disposition: A | Discharge status: Home / Self Care | Payer: No Typology Code available for payment source | Source: Ambulatory Visit | Attending: Radiation Oncology | Admitting: Radiation Oncology

## 2018-07-03 ENCOUNTER — Encounter: Payer: Self-pay | Admitting: *Deleted

## 2018-07-03 ENCOUNTER — Telehealth: Payer: Self-pay | Admitting: *Deleted

## 2018-07-03 DIAGNOSIS — C61 Malignant neoplasm of prostate: Secondary | ICD-10-CM | POA: Insufficient documentation

## 2018-07-03 DIAGNOSIS — N189 Chronic kidney disease, unspecified: Secondary | ICD-10-CM

## 2018-07-03 LAB — CMP (CANCER CENTER ONLY)
ALK PHOS: 77 U/L (ref 38–126)
ALT: 15 U/L (ref 0–44)
AST: 15 U/L (ref 15–41)
Albumin: 3 g/dL — ABNORMAL LOW (ref 3.5–5.0)
Anion gap: 8 (ref 5–15)
BILIRUBIN TOTAL: 0.3 mg/dL (ref 0.3–1.2)
BUN: 31 mg/dL — AB (ref 8–23)
CO2: 18 mmol/L — ABNORMAL LOW (ref 22–32)
Calcium: 8.6 mg/dL — ABNORMAL LOW (ref 8.9–10.3)
Chloride: 113 mmol/L — ABNORMAL HIGH (ref 98–111)
Creatinine: 3.07 mg/dL (ref 0.61–1.24)
GFR, EST NON AFRICAN AMERICAN: 18 mL/min — AB (ref >60)
GFR, Est AFR Am: 21 mL/min — ABNORMAL LOW (ref >60)
GLUCOSE: 96 mg/dL (ref 70–99)
POTASSIUM: 4.9 mmol/L (ref 3.5–5.1)
Sodium: 139 mmol/L (ref 135–145)
TOTAL PROTEIN: 7.3 g/dL (ref 6.5–8.1)

## 2018-07-03 NOTE — Telephone Encounter (Signed)
CALLED PATIENT TO ASK ABOUT COMING FOR LAB, PT. AGREED TO COME TODAY @ 3:30 PM, APPT. HAS BEEN ARRANGED, PT. IS AWARE OF THIS APPT.

## 2018-07-06 ENCOUNTER — Telehealth: Payer: Self-pay | Admitting: Radiation Oncology

## 2018-07-06 DIAGNOSIS — C61 Malignant neoplasm of prostate: Secondary | ICD-10-CM | POA: Insufficient documentation

## 2018-07-06 NOTE — Telephone Encounter (Signed)
Phoned patient's cell and home attempting to review lab work as ordered by Shona Simpson, PA-C. No answer at either number. Left a message with my contact information requesting a return call.

## 2018-07-07 ENCOUNTER — Telehealth: Payer: Self-pay | Admitting: Radiation Oncology

## 2018-07-07 ENCOUNTER — Encounter: Payer: Self-pay | Admitting: Medical Oncology

## 2018-07-07 NOTE — Progress Notes (Signed)
Left message with patient to introduce myself as the prostate nurse navigator and my role. I was unable to meet him when he consulted with Dr. Tammi Klippel. I will continue to follow and asked him to call me with questions or complaints.

## 2018-07-07 NOTE — Progress Notes (Signed)
Unable to reach patient yesterday I attempted again today. Patient answered. Explained that per Freeman Caldron, PA_C that his kidney function remains elevated at 3.07 which is stable compared to when it was checked on recent hospital admission 2 weeks ago. Strongly advised him to follow up with his nephrologist at the [facility] asap. Explained his results were sent to his primary physician at the [place]. Stressed that close following is very important. Patient verbalized understanding and expressed appreciation for the call.

## 2018-07-09 LAB — PROSTATE-SPECIFIC AG, SERUM (LABCORP): Prostate Specific Ag, Serum: 16.7 ng/mL — ABNORMAL HIGH (ref 0.0–4.0)

## 2018-07-10 ENCOUNTER — Telehealth: Payer: Self-pay | Admitting: *Deleted

## 2018-07-10 NOTE — Telephone Encounter (Signed)
CALLED PATIENT TO INFORM OF ADT INJ. -07-15-18 - ARRIVAL TIME- 9:30 AM @ DR. MANNY'S OFFICE, SPOKE WITH PATIENT AND HE IS AWARE OF THIS APPT.

## 2018-07-15 ENCOUNTER — Ambulatory Visit (HOSPITAL_COMMUNITY)
Admission: RE | Admit: 2018-07-15 | Discharge: 2018-07-15 | Disposition: A | Discharge status: Home / Self Care | Payer: No Typology Code available for payment source | Source: Ambulatory Visit | Attending: Urology | Admitting: Urology

## 2018-07-15 DIAGNOSIS — C61 Malignant neoplasm of prostate: Secondary | ICD-10-CM | POA: Diagnosis present

## 2018-07-15 MED ORDER — TECHNETIUM TC 99M MEDRONATE IV KIT
21.2000 | PACK | Freq: Once | INTRAVENOUS | Status: AC | PRN
  Administered 2018-07-15: 21.2 via INTRAVENOUS

## 2018-07-17 IMAGING — DX DG CHEST 1V PORT
1 series · 1 of 1 positions shown · non-contrast
Comparison: 12/28/2012

CLINICAL DATA: Syncope.

EXAM:
PORTABLE CHEST 1 VIEW

[chest]
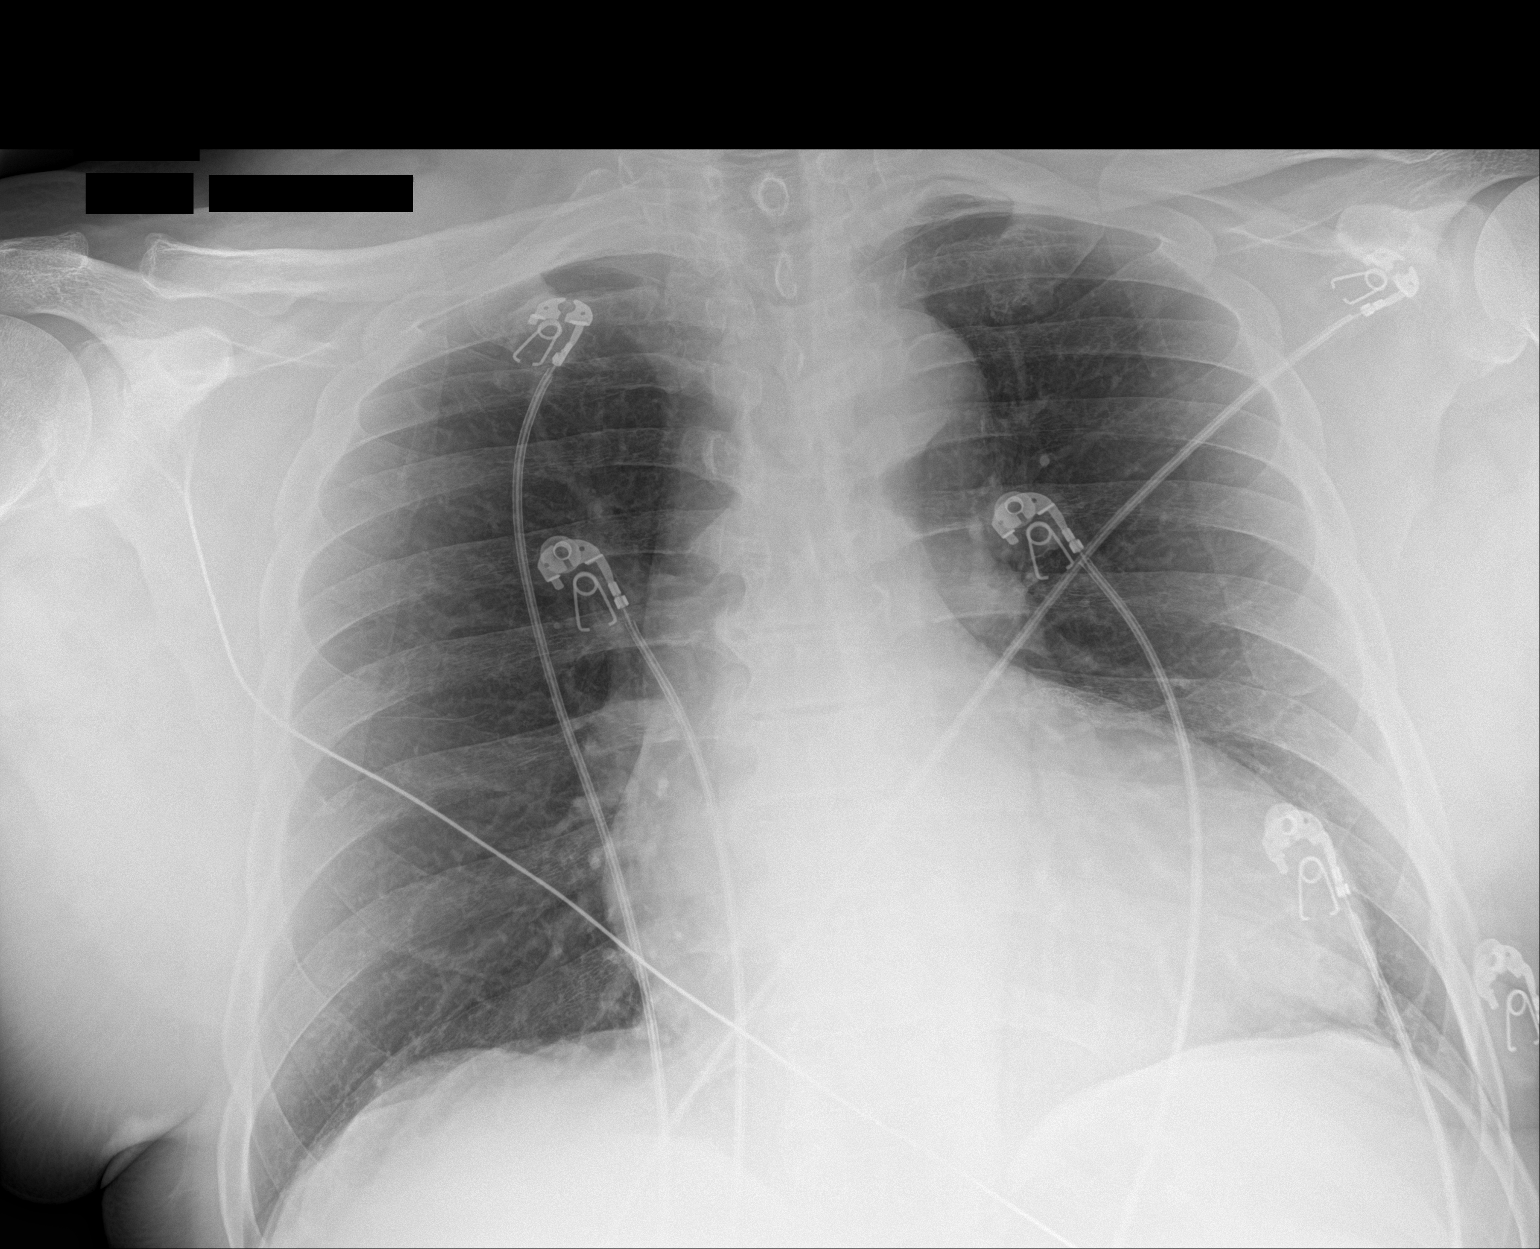

[1 of 1 positions shown; findings below may reference images not displayed]

FINDINGS: 7380 hours. Lordotic position. The cardio pericardial silhouette is
enlarged. The lungs are clear without focal pneumonia, edema,
pneumothorax or pleural effusion. The visualized bony structures of
the thorax are intact. Telemetry leads overlie the chest.
IMPRESSION: No active disease.

## 2018-07-30 ENCOUNTER — Encounter: Payer: Self-pay | Admitting: Urology

## 2018-07-30 NOTE — Progress Notes (Signed)
Per recent office note from follow-up with Dr. [name] on 07/27/2018, this patient is not felt to be a candidate for curative therapy for the prostate cancer due to his multiple medical comorbidities including recurrence of his renal cell carcinoma and stage III chronic renal insufficiency amongst others.  Dr. [name] has started him on Lupron for ADT which he will continue indefinitely but does not recommend proceeding with radiotherapy to the prostate for curative intent.  Nicholos Johns, MMS, PA-C [place] [facility] [place]: [phone]  Fax: [phone]

## 2018-08-27 ENCOUNTER — Encounter: Payer: Self-pay | Admitting: Medical Oncology

## 2018-09-14 ENCOUNTER — Telehealth: Payer: Self-pay | Admitting: Radiation Oncology

## 2018-09-14 NOTE — Telephone Encounter (Signed)
Received voicemail message from patient requesting a return call. Phoned patient back to inquire. No answer and no option to leave a message.

## 2019-03-15 ENCOUNTER — Other Ambulatory Visit: Payer: Self-pay

## 2019-03-15 ENCOUNTER — Emergency Department (HOSPITAL_COMMUNITY): Payer: No Typology Code available for payment source

## 2019-03-15 ENCOUNTER — Emergency Department (HOSPITAL_COMMUNITY)
Admission: EM | Admit: 2019-03-15 | Discharge: 2019-03-15 | Disposition: A | Discharge status: Home / Self Care | Payer: No Typology Code available for payment source | Attending: Emergency Medicine | Admitting: Emergency Medicine

## 2019-03-15 ENCOUNTER — Encounter (HOSPITAL_COMMUNITY): Payer: Self-pay | Admitting: Emergency Medicine

## 2019-03-15 DIAGNOSIS — I129 Hypertensive chronic kidney disease with stage 1 through stage 4 chronic kidney disease, or unspecified chronic kidney disease: Secondary | ICD-10-CM | POA: Diagnosis not present

## 2019-03-15 DIAGNOSIS — I2510 Atherosclerotic heart disease of native coronary artery without angina pectoris: Secondary | ICD-10-CM | POA: Diagnosis not present

## 2019-03-15 DIAGNOSIS — Z7901 Long term (current) use of anticoagulants: Secondary | ICD-10-CM | POA: Insufficient documentation

## 2019-03-15 DIAGNOSIS — Z7982 Long term (current) use of aspirin: Secondary | ICD-10-CM | POA: Insufficient documentation

## 2019-03-15 DIAGNOSIS — F17210 Nicotine dependence, cigarettes, uncomplicated: Secondary | ICD-10-CM | POA: Diagnosis not present

## 2019-03-15 DIAGNOSIS — R531 Weakness: Secondary | ICD-10-CM

## 2019-03-15 DIAGNOSIS — Z20828 Contact with and (suspected) exposure to other viral communicable diseases: Secondary | ICD-10-CM | POA: Diagnosis not present

## 2019-03-15 DIAGNOSIS — Z91040 Latex allergy status: Secondary | ICD-10-CM | POA: Insufficient documentation

## 2019-03-15 DIAGNOSIS — R0602 Shortness of breath: Secondary | ICD-10-CM | POA: Diagnosis not present

## 2019-03-15 DIAGNOSIS — E1122 Type 2 diabetes mellitus with diabetic chronic kidney disease: Secondary | ICD-10-CM | POA: Diagnosis not present

## 2019-03-15 DIAGNOSIS — C61 Malignant neoplasm of prostate: Secondary | ICD-10-CM | POA: Diagnosis not present

## 2019-03-15 DIAGNOSIS — N189 Chronic kidney disease, unspecified: Secondary | ICD-10-CM | POA: Insufficient documentation

## 2019-03-15 DIAGNOSIS — Z794 Long term (current) use of insulin: Secondary | ICD-10-CM | POA: Diagnosis not present

## 2019-03-15 DIAGNOSIS — D649 Anemia, unspecified: Secondary | ICD-10-CM | POA: Diagnosis not present

## 2019-03-15 DIAGNOSIS — R718 Other abnormality of red blood cells: Secondary | ICD-10-CM | POA: Diagnosis present

## 2019-03-15 LAB — CBC WITH DIFFERENTIAL/PLATELET
Abs Immature Granulocytes: 0.03 10*3/uL (ref 0.00–0.07)
Basophils Absolute: 0 10*3/uL (ref 0.0–0.1)
Basophils Relative: 0 %
Eosinophils Absolute: 0.3 10*3/uL (ref 0.0–0.5)
Eosinophils Relative: 5 %
HCT: 28.9 % — ABNORMAL LOW (ref 39.0–52.0)
Hemoglobin: 9.2 g/dL — ABNORMAL LOW (ref 13.0–17.0)
Immature Granulocytes: 0 %
Lymphocytes Relative: 29 %
Lymphs Abs: 2 10*3/uL (ref 0.7–4.0)
MCH: 29.6 pg (ref 26.0–34.0)
MCHC: 31.8 g/dL (ref 30.0–36.0)
MCV: 92.9 fL (ref 80.0–100.0)
Monocytes Absolute: 0.6 10*3/uL (ref 0.1–1.0)
Monocytes Relative: 9 %
Neutro Abs: 4 10*3/uL (ref 1.7–7.7)
Neutrophils Relative %: 57 %
Platelets: 164 10*3/uL (ref 150–400)
RBC: 3.11 MIL/uL — ABNORMAL LOW (ref 4.22–5.81)
RDW: 15.6 % — ABNORMAL HIGH (ref 11.5–15.5)
WBC: 7 10*3/uL (ref 4.0–10.5)
nRBC: 0 % (ref 0.0–0.2)

## 2019-03-15 LAB — COMPREHENSIVE METABOLIC PANEL
ALT: 12 U/L (ref 0–44)
AST: 16 U/L (ref 15–41)
Albumin: 3.5 g/dL (ref 3.5–5.0)
Alkaline Phosphatase: 56 U/L (ref 38–126)
Anion gap: 9 (ref 5–15)
BUN: 56 mg/dL — ABNORMAL HIGH (ref 8–23)
CO2: 18 mmol/L — ABNORMAL LOW (ref 22–32)
Calcium: 8.5 mg/dL — ABNORMAL LOW (ref 8.9–10.3)
Chloride: 113 mmol/L — ABNORMAL HIGH (ref 98–111)
Creatinine, Ser: 5.71 mg/dL — ABNORMAL HIGH (ref 0.61–1.24)
GFR calc Af Amer: 10 mL/min — ABNORMAL LOW (ref >60)
GFR calc non Af Amer: 9 mL/min — ABNORMAL LOW (ref >60)
Glucose, Bld: 94 mg/dL (ref 70–99)
Potassium: 4.4 mmol/L (ref 3.5–5.1)
Sodium: 140 mmol/L (ref 135–145)
Total Bilirubin: 0.2 mg/dL — ABNORMAL LOW (ref 0.3–1.2)
Total Protein: 8 g/dL (ref 6.5–8.1)

## 2019-03-15 LAB — TYPE AND SCREEN
ABO/RH(D): B POS
Antibody Screen: NEGATIVE

## 2019-03-15 LAB — ABO/RH: ABO/RH(D): B POS

## 2019-03-15 LAB — URINALYSIS, ROUTINE W REFLEX MICROSCOPIC
Bacteria, UA: NONE SEEN
Bilirubin Urine: NEGATIVE
Glucose, UA: 50 mg/dL — AB
Ketones, ur: NEGATIVE mg/dL
Leukocytes,Ua: NEGATIVE
Nitrite: NEGATIVE
Protein, ur: >=300 mg/dL — AB
Specific Gravity, Urine: 1.013 (ref 1.005–1.030)
pH: 5 (ref 5.0–8.0)

## 2019-03-15 LAB — PROTIME-INR
INR: 2.2 — ABNORMAL HIGH (ref 0.8–1.2)
Prothrombin Time: 23.9 seconds — ABNORMAL HIGH (ref 11.4–15.2)

## 2019-03-15 LAB — POC OCCULT BLOOD, ED: Fecal Occult Bld: NEGATIVE

## 2019-03-15 LAB — SARS CORONAVIRUS 2 BY RT PCR (HOSPITAL ORDER, PERFORMED IN ~~LOC~~ HOSPITAL LAB): SARS Coronavirus 2: NEGATIVE

## 2019-03-15 NOTE — ED Notes (Signed)
Pt reminded to provide urine specimen. Has urinal at bedside

## 2019-03-15 NOTE — ED Provider Notes (Signed)
[facility] DEPT Provider Note   CSN: [id number] Arrival date & time: 03/15/19  1446    History   Chief Complaint Chief Complaint  Patient presents with  . Abnormal Lab    HPI [name] is a 78 y.o. male.     HPI   [name] is a 78 y.o. male, with a history of chronic kidney disease, CAD, DM, GERD, HTN, obesity, prostate cancer, presenting to the ED with low hemoglobin.  States June 18 or 19 he was seen at his PCPs office for annual checkup.  Labs were drawn at that time.  He received a call today telling him his hemoglobin was 6 and he needed to go to the ED. He endorses fatigue, generalized weakness, and exertional shortness of breath for about the last 3 weeks.  He states he has active prostate cancer for which he is receiving Lupron for about the past year.  His last treatment was April 2020 and he is supposed to receive them every 6 months.  He is under the care of Dr. [name]. He endorses having daily bowel movements, though with difficulty.  He has had difficulty with bowel movements since his cancer diagnosis. Denies chest pain, shortness of breath at rest, abdominal pain, hematochezia/melena, hematuria, difficulty with urination, N/V/D, dizziness, syncope, or any other complaints.   Past Medical History:  Diagnosis Date  . Chronic kidney disease    renal insufficiency  . Coronary artery disease   . Diabetes mellitus    type 2-non-insulin  . GERD (gastroesophageal reflux disease)   . Gout   . Hypertension   . Obesity   . Prostate cancer ([place])   . Tobacco abuse counseling     Patient Active Problem List   Diagnosis Date Noted  . Malignant neoplasm of prostate ([place]) 07/06/2018  . Hypoglycemia 03/29/2017  . Coronary artery disease   . Hypertension   . Diabetes mellitus   . Chronic kidney disease     Past Surgical History:  Procedure Laterality Date  . CARDIAC CATHETERIZATION  02/08/02  . CARDIAC CATHETERIZATION   11/20/10   drug eluting stent;mid left LAD, There is mild hypokinesis of the anterolateral wall. LVEF  is estimated at 50-55%  . INSERTION OF BRONCHIAL STENT     went in groin to R chest  . PENILE PROSTHESIS IMPLANT     '05        Home Medications    Prior to Admission medications   Medication Sig Start Date End Date Taking? Authorizing Provider  albuterol (PROVENTIL HFA;VENTOLIN HFA) 108 (90 BASE) MCG/ACT inhaler Inhale 1 puff into the lungs every 6 (six) hours as needed for wheezing or shortness of breath.    [provider]  allopurinol (ZYLOPRIM) 100 MG tablet Take 100 mg by mouth daily.      [provider]  amLODipine (NORVASC) 10 MG tablet Take 5 mg by mouth daily.     [provider]  aspirin EC 81 MG tablet Take 81 mg by mouth daily.    [provider]  atorvastatin (LIPITOR) 40 MG tablet Take 1 tablet (40 mg total) by mouth daily at 6 PM. Patient taking differently: Take 40 mg by mouth daily.  03/30/17   Rogue Bussing, MD  cholecalciferol (VITAMIN D) 1000 UNITS tablet Take 1,000 Units by mouth daily.    [provider]  enoxaparin (LOVENOX) 40 MG/0.4ML injection Inject 0.95 mLs (95 mg total) into the skin daily. 06/21/18   Geiple,  Vonna Kotyk, PA-C  gabapentin (NEURONTIN) 100 MG capsule Take 2 capsules (200 mg total) by mouth at bedtime. Patient taking differently: Take 100 mg by mouth at bedtime.  03/30/17   Rogue Bussing, MD  glipiZIDE (GLUCOTROL) 10 MG tablet Take by mouth.    [provider]  hydrochlorothiazide (HYDRODIURIL) 25 MG tablet Take by mouth.    [provider]  insulin glargine (LANTUS) 100 UNIT/ML injection Inject into the skin.    [provider]  isosorbide dinitrate (ISORDIL) 10 MG tablet Take by mouth.    [provider]  latanoprost (XALATAN) 0.005 % ophthalmic solution Place 1 drop into both eyes at bedtime.    [provider]  losartan (COZAAR) 50 MG  tablet Take 50 mg by mouth daily.    [provider]  metFORMIN (GLUCOPHAGE) 850 MG tablet Take by mouth.    [provider]  nitroGLYCERIN (NITROSTAT) 0.4 MG SL tablet Place 0.4 mg under the tongue every 5 (five) minutes as needed. For chest pain    [provider]  omeprazole (PRILOSEC) 20 MG capsule Take 20 mg by mouth daily.    [provider]  ranitidine (ZANTAC) 150 MG tablet Take by mouth.    [provider]  simvastatin (ZOCOR) 80 MG tablet Take by mouth.    [provider]  terazosin (HYTRIN) 5 MG capsule Take 5 mg by mouth every morning.    [provider]  timolol (BETIMOL) 0.5 % ophthalmic solution Place 1 drop into both eyes daily.    [provider]  warfarin (COUMADIN) 5 MG tablet Take 2 tablets (10 mg total) by mouth daily. 06/21/18   Carlisle Cater, PA-C    Family History Family History  Problem Relation Age of Onset  . Aneurysm Mother   . Multiple myeloma Mother   . Aneurysm Father   . Cancer Brother   . Prostate cancer Brother   . Cancer Other   . Prostate cancer Brother   . Bone cancer Brother   . Breast cancer Neg Hx   . Colon cancer Neg Hx   . Pancreatic cancer Neg Hx     Social History Social History   Tobacco Use  . Smoking status: Current Every Day Smoker    Packs/day: 0.50    Years: 50.00    Pack years: 25.00    Types: Cigarettes  . Smokeless tobacco: Never Used  Substance Use Topics  . Alcohol use: Not Currently    Comment: drank 5 beers 03-28-17  . Drug use: Not Currently    Types: Cocaine, Marijuana     Allergies   Alpha blocker quinazolines and Latex   Review of Systems Review of Systems  Constitutional: Positive for fatigue. Negative for chills and fever.  Respiratory: Positive for shortness of breath. Negative for cough.   Cardiovascular: Negative for chest pain and leg swelling.  Gastrointestinal: Negative for abdominal pain, blood in stool, constipation,  diarrhea, nausea and vomiting.  Genitourinary: Negative for discharge, dysuria, flank pain, frequency, hematuria and testicular pain.  Neurological: Positive for weakness. Negative for dizziness, syncope and light-headedness.  All other systems reviewed and are negative.    Physical Exam Updated Vital Signs BP (!) 176/92 (BP Location: Right Arm)   Pulse 70   Temp 98.5 F (36.9 C) (Oral)   Resp 20   Ht 5' 8"  (1.727 m)   Wt 99.3 kg   SpO2 99%   BMI 33.30 kg/m   Physical Exam Vitals signs and  nursing note reviewed.  Constitutional:      General: He is not in acute distress.    Appearance: He is well-developed. He is not diaphoretic.  HENT:     Head: Normocephalic and atraumatic.     Mouth/Throat:     Mouth: Mucous membranes are moist.     Pharynx: Oropharynx is clear.  Eyes:     Conjunctiva/sclera: Conjunctivae normal.  Neck:     Musculoskeletal: Neck supple.  Cardiovascular:     Rate and Rhythm: Normal rate and regular rhythm.     Pulses: Normal pulses.          Radial pulses are 2+ on the right side and 2+ on the left side.       Posterior tibial pulses are 2+ on the right side and 2+ on the left side.     Heart sounds: Normal heart sounds.     Comments: Tactile temperature in the extremities appropriate and equal bilaterally. Pulmonary:     Effort: Pulmonary effort is normal. No respiratory distress.     Breath sounds: Normal breath sounds.  Abdominal:     Palpations: Abdomen is soft.     Tenderness: There is no abdominal tenderness. There is no guarding.  Genitourinary:    Rectum: Guaiac result negative.     Comments: Rectal Exam:  No external hemorrhoids, fissures, or lesions noted.  No frank blood or melena. No stool burden.  No rectal tenderness. No foreign bodies noted.   Musculoskeletal:     Right lower leg: No edema.     Left lower leg: No edema.  Lymphadenopathy:     Cervical: No cervical adenopathy.  Skin:    General: Skin is warm and dry.   Neurological:     Mental Status: He is alert.  Psychiatric:        Mood and Affect: Mood and affect normal.        Speech: Speech normal.        Behavior: Behavior normal.      ED Treatments / Results  Labs (all labs ordered are listed, but only abnormal results are displayed) Labs Reviewed  SARS CORONAVIRUS 2 (HOSPITAL ORDER, [facility])  COMPREHENSIVE METABOLIC PANEL  CBC WITH DIFFERENTIAL/PLATELET  PROTIME-INR  URINALYSIS, ROUTINE W REFLEX MICROSCOPIC  POC OCCULT BLOOD, ED  TYPE AND SCREEN    EKG EKG Interpretation  Date/Time:  Monday March 15 2019 16:09:41 EDT Ventricular Rate:  67 PR Interval:    QRS Duration: 141 QT Interval:  447 QTC Calculation: 472 R Axis:   -55 Text Interpretation:  Sinus rhythm Prolonged PR interval Probable left atrial enlargement Nonspecific IVCD with LAD Left ventricular hypertrophy Confirmed by Lennice Sites [phone]) on 03/15/2019 4:28:38 PM   Radiology No results found.  Procedures Procedures (including critical care time)  Medications Ordered in ED Medications - No data to display   Initial Impression / Assessment and Plan / ED Course  I have reviewed the triage vital signs and the nursing notes.  Pertinent labs & imaging results that were available during my care of the patient were reviewed by me and considered in my medical decision making (see chart for details).        Patient presents with report of low hemoglobin.  He also complains of fatigue, generalized weakness, and exertional shortness of breath.  Patient is nontoxic appearing, afebrile, not tachycardic, not tachypneic, not hypotensive, maintains excellent SPO2 on room air, and is in no apparent distress.  Patient states he has a history of difficult IV access, therefore obtaining blood work was delayed.  End of shift patient care handoff report given to Providence Lanius, PA-C. Plan: Obtain blood work. Transfuse and admit as  necessary.   Final Clinical Impressions(s) / ED Diagnoses   Final diagnoses:  None    ED Discharge Orders    None       Layla Maw 03/15/19 1711    Lennice Sites, DO 03/15/19 2156

## 2019-03-15 NOTE — ED Provider Notes (Signed)
Care assumed from [place], [place] at shift change with labs pending.   In brief, this patient is a 78 y.o. M with PMH/o prostate cancer (currently receiving hormonal therapy) who was notified by his PCP that he had a Hgb of 6 with blood from his annual physical 3 days ago. No history of abnormal bleeding. Does endorse 3 weeks of generalized weakness, fatigue and SOB with exertion. Please see note from previous provider for full history/physical exam.    Physical Exam  BP (!) 155/97   Pulse 74   Temp 98.5 F (36.9 C) (Oral)   Resp 18   Ht 5\' 8"  (1.727 m)   Wt 99.3 kg   SpO2 97%   BMI 33.30 kg/m   Physical Exam   Lungs clear to auscultation bilaterally.  Symmetric chest rise.  No wheezing, rales, rhonchi.  ED Course/Procedures     Procedures  MDM   PLAN: Patient pending lab work. If Hgb is low, plan for transfusion and admission if hgb low.   MDM: CBC shows no leukocytosis.  Hemoglobin is 9.2.  Platelets are unremarkable.  INR is 2.2.  Fecal occult was negative.  CMP shows bicarb of 18.  BUN and creatinine are elevated at 56 and 5.71.  He does have history of chronic kidney disease.  His baseline creatinine is in the threes.  UA is unremarkable.  Chest x-ray shows no evidence of infectious process.  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  No pulmonary edema.  Discussed results with patient.  Patient is resting comfortably in the bed.  No signs of distress.  Vital signs are stable.  He is not tachycardic or hypoxic.  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  He has not missed any doses.  I discussed with patient that given his reassuring hemoglobin, he does not need transfusion at this time.  Discussed patient with Dr. Ronnald Nian.  At this time, his kidney function appears to be worsening of chronic kidney disease.  No indication for admission. Dr. Ronnald Nian agreeable to plan.  At this time, given reassuring vitals and  the fact that patient is on Coumadin, do not suspect PE.  Encourage patient to follow-up with his primary care doctor.  He is agreement for plans to be discharged home. At this time, patient exhibits no emergent life-threatening condition that require further evaluation in ED or admission. Patient had ample opportunity for questions and discussion. All patient's questions were answered with full understanding. Strict return precautions discussed. Patient expresses understanding and agreement to plan.   1. Anemia, unspecified type   2. Generalized weakness      Portions of this note were generated with Dragon dictation software. Dictation errors may occur despite best attempts at proofreading.    Volanda Napoleon, PA-C 03/16/19 Francis Gaines, [place], DO 03/17/19 [phone]

## 2019-03-15 NOTE — ED Triage Notes (Signed)
Pt sent from primary for hemoglobin of 6 at routine appointment; pt complaint of exertional SOB related to such.

## 2019-03-15 NOTE — ED Notes (Signed)
Pt given food and drink, per PA

## 2019-03-15 NOTE — Discharge Instructions (Signed)
As we discussed today, your blood levels had improved today.   Your kidney function is slgihtly worse. This needs to be followed up by your primary care doctor or your oncology doctor.   Return to the Emergency Department for any worsening pain, difficulty rbeathing, vomiting or an other worsening or concerning symptoms.

## 2019-03-15 NOTE — ED Notes (Signed)
Pt is aware a urine specimen is needed and has a urinal at bedside. Pt will provide when able to void.

## 2019-04-19 ENCOUNTER — Inpatient Hospital Stay (HOSPITAL_COMMUNITY)
Admission: EM | Admit: 2019-04-19 | Discharge: 2019-04-28 | DRG: 264 | Disposition: A | Discharge status: Home / Self Care | Payer: No Typology Code available for payment source | Attending: Internal Medicine | Admitting: Internal Medicine

## 2019-04-19 ENCOUNTER — Other Ambulatory Visit: Payer: Self-pay

## 2019-04-19 ENCOUNTER — Encounter (HOSPITAL_COMMUNITY): Payer: Self-pay | Admitting: Emergency Medicine

## 2019-04-19 ENCOUNTER — Emergency Department (HOSPITAL_COMMUNITY): Payer: No Typology Code available for payment source

## 2019-04-19 DIAGNOSIS — I429 Cardiomyopathy, unspecified: Secondary | ICD-10-CM | POA: Diagnosis present

## 2019-04-19 DIAGNOSIS — D649 Anemia, unspecified: Secondary | ICD-10-CM

## 2019-04-19 DIAGNOSIS — I2510 Atherosclerotic heart disease of native coronary artery without angina pectoris: Secondary | ICD-10-CM | POA: Diagnosis present

## 2019-04-19 DIAGNOSIS — N2581 Secondary hyperparathyroidism of renal origin: Secondary | ICD-10-CM | POA: Diagnosis present

## 2019-04-19 DIAGNOSIS — E785 Hyperlipidemia, unspecified: Secondary | ICD-10-CM | POA: Diagnosis present

## 2019-04-19 DIAGNOSIS — D696 Thrombocytopenia, unspecified: Secondary | ICD-10-CM | POA: Diagnosis not present

## 2019-04-19 DIAGNOSIS — Z20828 Contact with and (suspected) exposure to other viral communicable diseases: Secondary | ICD-10-CM | POA: Diagnosis present

## 2019-04-19 DIAGNOSIS — T82855A Stenosis of coronary artery stent, initial encounter: Secondary | ICD-10-CM | POA: Diagnosis present

## 2019-04-19 DIAGNOSIS — Z955 Presence of coronary angioplasty implant and graft: Secondary | ICD-10-CM

## 2019-04-19 DIAGNOSIS — E872 Acidosis: Secondary | ICD-10-CM | POA: Diagnosis present

## 2019-04-19 DIAGNOSIS — Z7901 Long term (current) use of anticoagulants: Secondary | ICD-10-CM

## 2019-04-19 DIAGNOSIS — Y832 Surgical operation with anastomosis, bypass or graft as the cause of abnormal reaction of the patient, or of later complication, without mention of misadventure at the time of the procedure: Secondary | ICD-10-CM | POA: Diagnosis present

## 2019-04-19 DIAGNOSIS — I132 Hypertensive heart and chronic kidney disease with heart failure and with stage 5 chronic kidney disease, or end stage renal disease: Principal | ICD-10-CM | POA: Diagnosis present

## 2019-04-19 DIAGNOSIS — Z452 Encounter for adjustment and management of vascular access device: Secondary | ICD-10-CM

## 2019-04-19 DIAGNOSIS — I5023 Acute on chronic systolic (congestive) heart failure: Secondary | ICD-10-CM | POA: Diagnosis present

## 2019-04-19 DIAGNOSIS — N189 Chronic kidney disease, unspecified: Secondary | ICD-10-CM | POA: Diagnosis present

## 2019-04-19 DIAGNOSIS — Z7982 Long term (current) use of aspirin: Secondary | ICD-10-CM

## 2019-04-19 DIAGNOSIS — R0602 Shortness of breath: Secondary | ICD-10-CM

## 2019-04-19 DIAGNOSIS — R079 Chest pain, unspecified: Secondary | ICD-10-CM | POA: Diagnosis not present

## 2019-04-19 DIAGNOSIS — N186 End stage renal disease: Secondary | ICD-10-CM | POA: Diagnosis present

## 2019-04-19 DIAGNOSIS — N185 Chronic kidney disease, stage 5: Secondary | ICD-10-CM

## 2019-04-19 DIAGNOSIS — E1122 Type 2 diabetes mellitus with diabetic chronic kidney disease: Secondary | ICD-10-CM | POA: Diagnosis present

## 2019-04-19 DIAGNOSIS — M109 Gout, unspecified: Secondary | ICD-10-CM | POA: Diagnosis present

## 2019-04-19 DIAGNOSIS — Z86718 Personal history of other venous thrombosis and embolism: Secondary | ICD-10-CM

## 2019-04-19 DIAGNOSIS — F17210 Nicotine dependence, cigarettes, uncomplicated: Secondary | ICD-10-CM | POA: Diagnosis present

## 2019-04-19 DIAGNOSIS — Z79818 Long term (current) use of other agents affecting estrogen receptors and estrogen levels: Secondary | ICD-10-CM

## 2019-04-19 DIAGNOSIS — D631 Anemia in chronic kidney disease: Secondary | ICD-10-CM | POA: Diagnosis present

## 2019-04-19 DIAGNOSIS — I4729 Other ventricular tachycardia: Secondary | ICD-10-CM

## 2019-04-19 DIAGNOSIS — I5041 Acute combined systolic (congestive) and diastolic (congestive) heart failure: Secondary | ICD-10-CM

## 2019-04-19 DIAGNOSIS — C61 Malignant neoplasm of prostate: Secondary | ICD-10-CM | POA: Diagnosis present

## 2019-04-19 DIAGNOSIS — I472 Ventricular tachycardia: Secondary | ICD-10-CM | POA: Diagnosis not present

## 2019-04-19 DIAGNOSIS — I471 Supraventricular tachycardia: Secondary | ICD-10-CM | POA: Diagnosis not present

## 2019-04-19 DIAGNOSIS — Z6838 Body mass index (BMI) 38.0-38.9, adult: Secondary | ICD-10-CM

## 2019-04-19 DIAGNOSIS — Z419 Encounter for procedure for purposes other than remedying health state, unspecified: Secondary | ICD-10-CM

## 2019-04-19 DIAGNOSIS — K219 Gastro-esophageal reflux disease without esophagitis: Secondary | ICD-10-CM | POA: Diagnosis present

## 2019-04-19 DIAGNOSIS — G4733 Obstructive sleep apnea (adult) (pediatric): Secondary | ICD-10-CM | POA: Diagnosis present

## 2019-04-19 DIAGNOSIS — Z79899 Other long term (current) drug therapy: Secondary | ICD-10-CM

## 2019-04-19 DIAGNOSIS — I447 Left bundle-branch block, unspecified: Secondary | ICD-10-CM | POA: Diagnosis present

## 2019-04-19 DIAGNOSIS — I10 Essential (primary) hypertension: Secondary | ICD-10-CM | POA: Diagnosis present

## 2019-04-19 LAB — TROPONIN I (HIGH SENSITIVITY)
Troponin I (High Sensitivity): 34 ng/L — ABNORMAL HIGH (ref <18)
Troponin I (High Sensitivity): 31 ng/L — ABNORMAL HIGH (ref <18)
Troponin I (High Sensitivity): 35 ng/L — ABNORMAL HIGH (ref <18)

## 2019-04-19 LAB — BASIC METABOLIC PANEL
Anion gap: 8 (ref 5–15)
BUN: 52 mg/dL — ABNORMAL HIGH (ref 8–23)
CO2: 20 mmol/L — ABNORMAL LOW (ref 22–32)
Calcium: 8.5 mg/dL — ABNORMAL LOW (ref 8.9–10.3)
Chloride: 111 mmol/L (ref 98–111)
Creatinine, Ser: 5.73 mg/dL — ABNORMAL HIGH (ref 0.61–1.24)
GFR calc Af Amer: 10 mL/min — ABNORMAL LOW (ref >60)
GFR calc non Af Amer: 9 mL/min — ABNORMAL LOW (ref >60)
Glucose, Bld: 117 mg/dL — ABNORMAL HIGH (ref 70–99)
Potassium: 4.7 mmol/L (ref 3.5–5.1)
Sodium: 139 mmol/L (ref 135–145)

## 2019-04-19 LAB — PROTIME-INR
INR: 2.8 — ABNORMAL HIGH (ref 0.8–1.2)
Prothrombin Time: 29.3 seconds — ABNORMAL HIGH (ref 11.4–15.2)

## 2019-04-19 LAB — CBC
HCT: 27 % — ABNORMAL LOW (ref 39.0–52.0)
Hemoglobin: 8.8 g/dL — ABNORMAL LOW (ref 13.0–17.0)
MCH: 30.3 pg (ref 26.0–34.0)
MCHC: 32.6 g/dL (ref 30.0–36.0)
MCV: 93.1 fL (ref 80.0–100.0)
Platelets: 146 10*3/uL — ABNORMAL LOW (ref 150–400)
RBC: 2.9 MIL/uL — ABNORMAL LOW (ref 4.22–5.81)
RDW: 15.5 % (ref 11.5–15.5)
WBC: 7.2 10*3/uL (ref 4.0–10.5)
nRBC: 0 % (ref 0.0–0.2)

## 2019-04-19 LAB — SARS CORONAVIRUS 2 BY RT PCR (HOSPITAL ORDER, PERFORMED IN ~~LOC~~ HOSPITAL LAB): SARS Coronavirus 2: NEGATIVE

## 2019-04-19 LAB — D-DIMER, QUANTITATIVE: D-Dimer, Quant: 0.81 ug/mL-FEU — ABNORMAL HIGH (ref 0.00–0.50)

## 2019-04-19 MED ORDER — ORAL CARE MOUTH RINSE
15.0000 mL | Freq: Two times a day (BID) | OROMUCOSAL | Status: DC
  Administered 2019-04-19 – 2019-04-27 (×15): 15 mL via OROMUCOSAL

## 2019-04-19 MED ORDER — TIMOLOL HEMIHYDRATE 0.5 % OP SOLN: 1.0000 [drp] | Freq: Every day | OPHTHALMIC | Status: DC

## 2019-04-19 MED ORDER — GABAPENTIN 100 MG PO CAPS
100.0000 mg | ORAL_CAPSULE | Freq: Every day | ORAL | Status: DC
  Administered 2019-04-19 – 2019-04-25 (×7): 100 mg via ORAL
  Filled 2019-04-19 (×7): qty 1

## 2019-04-19 MED ORDER — ALLOPURINOL 100 MG PO TABS
100.0000 mg | ORAL_TABLET | Freq: Two times a day (BID) | ORAL | Status: DC
  Administered 2019-04-19 – 2019-04-28 (×17): 100 mg via ORAL
  Filled 2019-04-19 (×17): qty 1

## 2019-04-19 MED ORDER — IPRATROPIUM-ALBUTEROL 0.5-2.5 (3) MG/3ML IN SOLN: 3.0000 mL | Freq: Four times a day (QID) | RESPIRATORY_TRACT | Status: DC

## 2019-04-19 MED ORDER — MORPHINE SULFATE (PF) 2 MG/ML IV SOLN
2.0000 mg | Freq: Once | INTRAVENOUS | Status: AC
  Administered 2019-04-19: 2 mg via INTRAVENOUS
  Filled 2019-04-19: qty 1

## 2019-04-19 MED ORDER — DOCUSATE SODIUM 100 MG PO CAPS
100.0000 mg | ORAL_CAPSULE | Freq: Every day | ORAL | Status: DC
  Administered 2019-04-20 – 2019-04-28 (×8): 100 mg via ORAL
  Filled 2019-04-19 (×8): qty 1

## 2019-04-19 MED ORDER — LATANOPROST 0.005 % OP SOLN: 1.0000 [drp] | Freq: Every day | OPHTHALMIC | Status: DC

## 2019-04-19 MED ORDER — PANTOPRAZOLE SODIUM 40 MG PO TBEC
40.0000 mg | DELAYED_RELEASE_TABLET | Freq: Every day | ORAL | Status: DC
  Administered 2019-04-20 – 2019-04-28 (×8): 40 mg via ORAL
  Filled 2019-04-19 (×8): qty 1

## 2019-04-19 MED ORDER — VITAMIN D 25 MCG (1000 UNIT) PO TABS
1000.0000 [IU] | ORAL_TABLET | Freq: Every day | ORAL | Status: DC
  Administered 2019-04-20 – 2019-04-28 (×8): 1000 [IU] via ORAL
  Filled 2019-04-19 (×8): qty 1

## 2019-04-19 MED ORDER — IPRATROPIUM-ALBUTEROL 0.5-2.5 (3) MG/3ML IN SOLN
3.0000 mL | Freq: Four times a day (QID) | RESPIRATORY_TRACT | Status: DC
  Administered 2019-04-20 – 2019-04-21 (×6): 3 mL via RESPIRATORY_TRACT
  Filled 2019-04-19 (×7): qty 3

## 2019-04-19 MED ORDER — WARFARIN - PHARMACIST DOSING INPATIENT: Freq: Every day | Status: DC

## 2019-04-19 MED ORDER — ASPIRIN EC 81 MG PO TBEC
81.0000 mg | DELAYED_RELEASE_TABLET | Freq: Every day | ORAL | Status: DC
  Administered 2019-04-19 – 2019-04-27 (×9): 81 mg via ORAL
  Filled 2019-04-19 (×9): qty 1

## 2019-04-19 MED ORDER — TERAZOSIN HCL 5 MG PO CAPS
5.0000 mg | ORAL_CAPSULE | Freq: Every morning | ORAL | Status: DC
  Administered 2019-04-20 – 2019-04-28 (×8): 5 mg via ORAL
  Filled 2019-04-19 (×9): qty 1

## 2019-04-19 MED ORDER — ALBUTEROL SULFATE (2.5 MG/3ML) 0.083% IN NEBU
2.5000 mg | INHALATION_SOLUTION | RESPIRATORY_TRACT | Status: DC | PRN
  Administered 2019-04-19: 2.5 mg via RESPIRATORY_TRACT
  Filled 2019-04-19: qty 3

## 2019-04-19 MED ORDER — HYDRALAZINE HCL 20 MG/ML IJ SOLN
10.0000 mg | Freq: Once | INTRAMUSCULAR | Status: AC
  Administered 2019-04-19: 10 mg via INTRAVENOUS
  Filled 2019-04-19: qty 1

## 2019-04-19 MED ORDER — AMLODIPINE BESYLATE 10 MG PO TABS
10.0000 mg | ORAL_TABLET | Freq: Every day | ORAL | Status: DC
  Administered 2019-04-19 – 2019-04-20 (×2): 10 mg via ORAL
  Filled 2019-04-19 (×2): qty 1

## 2019-04-19 MED ORDER — NITROGLYCERIN 0.4 MG SL SUBL
0.4000 mg | SUBLINGUAL_TABLET | SUBLINGUAL | Status: DC | PRN
  Administered 2019-04-19 (×3): 0.4 mg via SUBLINGUAL
  Filled 2019-04-19 (×2): qty 1

## 2019-04-19 MED ORDER — ATORVASTATIN CALCIUM 40 MG PO TABS: 40.0000 mg | ORAL_TABLET | Freq: Every day | ORAL | Status: DC

## 2019-04-19 MED ORDER — WARFARIN SODIUM 5 MG PO TABS
7.5000 mg | ORAL_TABLET | Freq: Once | ORAL | Status: AC
  Administered 2019-04-19: 7.5 mg via ORAL
  Filled 2019-04-19: qty 3
  Filled 2019-04-19: qty 1.5

## 2019-04-19 NOTE — ED Notes (Signed)
ED TO INPATIENT HANDOFF REPORT  Name/Age/Gender [name] 78 y.o. male  Code Status Code Status History    Date Active Date Inactive Code Status Order ID Comments User Context   03/29/2017 1354 03/30/2017 2137 Full Code [id number]  Verner Mould, MD ED   Advance Care Planning Activity      Home/SNF/Other Home  Chief Complaint short of breath   Level of Care/Admitting Diagnosis ED Disposition    ED Disposition Condition [facility] Area: [facility] [[id number]]  Level of Care: Telemetry [5]  Admit to tele based on following criteria: Other see comments  Comments: chest pain  Covid Evaluation: Confirmed COVID Negative  Diagnosis: Chest pain [323557]  Admitting Physician: Barb Merino [[id number]]  Attending Physician: Barb Merino [[id number]]  PT Class (Do Not Modify): Observation [104]  PT Acc Code (Do Not Modify): Observation [10022]       Medical History Past Medical History:  Diagnosis Date  . Chronic kidney disease    renal insufficiency  . Coronary artery disease   . Diabetes mellitus    type 2-non-insulin  . GERD (gastroesophageal reflux disease)   . Gout   . Hypertension   . Obesity   . Prostate cancer ([place])   . Tobacco abuse counseling     Allergies No Known Allergies  IV Location/Drains/Wounds Patient Lines/Drains/Airways Status   Active Line/Drains/Airways    Name:   Placement date:   Placement time:   Site:   Days:   Peripheral IV 04/19/19 Right Antecubital   04/19/19    1432    Antecubital   less than 1          Labs/Imaging Results for orders placed or performed during the hospital encounter of 04/19/19 (from the past 48 hour(s))  Basic metabolic panel     Status: Abnormal   Collection Time: 04/19/19  1:46 PM  Result Value Ref Range   Sodium 139 135 - 145 mmol/L   Potassium 4.7 3.5 - 5.1 mmol/L   Chloride 111 98 - 111 mmol/L   CO2 20 (L) 22 - 32 mmol/L   Glucose, Bld 117 (H) 70 - 99 mg/dL    BUN 52 (H) 8 - 23 mg/dL   Creatinine, Ser 5.73 (H) 0.61 - 1.24 mg/dL   Calcium 8.5 (L) 8.9 - 10.3 mg/dL   GFR calc non Af Amer 9 (L) >60 mL/min   GFR calc Af Amer 10 (L) >60 mL/min   Anion gap 8 5 - 15    Comment: Performed at [facility], [place] [address], [place], [place] [address]  CBC     Status: Abnormal   Collection Time: 04/19/19  1:46 PM  Result Value Ref Range   WBC 7.2 4.0 - 10.5 K/uL   RBC 2.90 (L) 4.22 - 5.81 MIL/uL   Hemoglobin 8.8 (L) 13.0 - 17.0 g/dL   HCT 27.0 (L) 39.0 - 52.0 %   MCV 93.1 80.0 - 100.0 fL   MCH 30.3 26.0 - 34.0 pg   MCHC 32.6 30.0 - 36.0 g/dL   RDW 15.5 11.5 - 15.5 %   Platelets 146 (L) 150 - 400 K/uL   nRBC 0.0 0.0 - 0.2 %    Comment: Performed at [facility], [place] [address], [place], [place] [address]  Troponin I (High Sensitivity)     Status: Abnormal   Collection Time: 04/19/19  1:46 PM  Result Value Ref Range   Troponin I (High  Sensitivity) 34 (H) <18 ng/L    Comment: (NOTE) Elevated high sensitivity troponin I (hsTnI) values and significant  changes across serial measurements may suggest ACS but many other  chronic and acute conditions are known to elevate hsTnI results.  Refer to the "Links" section for chest pain algorithms and additional  guidance. Performed at [facility], [place] [address] [place], [place] [address]   D-dimer, quantitative (not at [facility])     Status: Abnormal   Collection Time: 04/19/19  2:08 PM  Result Value Ref Range   D-Dimer, Quant 0.81 (H) 0.00 - 0.50 ug/mL-FEU    Comment: (NOTE) At the manufacturer cut-off of 0.50 ug/mL FEU, this assay has been documented to exclude PE with a sensitivity and negative predictive value of 97 to 99%.  At this time, this assay has not been approved by the FDA to exclude DVT/VTE. Results should be correlated with clinical presentation. Performed at [facility], [place] [address], [place], [place]  [address]   Protime-INR     Status: Abnormal   Collection Time: 04/19/19  2:08 PM  Result Value Ref Range   Prothrombin Time 29.3 (H) 11.4 - 15.2 seconds   INR 2.8 (H) 0.8 - 1.2    Comment: (NOTE) INR goal varies based on device and disease states. Performed at [facility], [place] [address], [place], [place] [address]   SARS Coronavirus 2 (CEPHEID - Performed in [place] hospital lab), Hosp Order     Status: None   Collection Time: 04/19/19  2:08 PM   Specimen: Nasopharyngeal Swab  Result Value Ref Range   SARS Coronavirus 2 NEGATIVE NEGATIVE    Comment: (NOTE) If result is NEGATIVE SARS-CoV-2 target nucleic acids are NOT DETECTED. The SARS-CoV-2 RNA is generally detectable in upper and lower  respiratory specimens during the acute phase of infection. The lowest  concentration of SARS-CoV-2 viral copies this assay can detect is 250  copies / mL. A negative result does not preclude SARS-CoV-2 infection  and should not be used as the sole basis for treatment or other  patient management decisions.  A negative result may occur with  improper specimen collection / handling, submission of specimen other  than nasopharyngeal swab, presence of viral mutation(s) within the  areas targeted by this assay, and inadequate number of viral copies  (<250 copies / mL). A negative result must be combined with clinical  observations, patient history, and epidemiological information. If result is POSITIVE SARS-CoV-2 target nucleic acids are DETECTED. The SARS-CoV-2 RNA is generally detectable in upper and lower  respiratory specimens dur ing the acute phase of infection.  Positive  results are indicative of active infection with SARS-CoV-2.  Clinical  correlation with patient history and other diagnostic information is  necessary to determine patient infection status.  Positive results do  not rule out bacterial infection or co-infection with other viruses. If result is  PRESUMPTIVE POSTIVE SARS-CoV-2 nucleic acids MAY BE PRESENT.   A presumptive positive result was obtained on the submitted specimen  and confirmed on repeat testing.  While 2019 novel coronavirus  (SARS-CoV-2) nucleic acids may be present in the submitted sample  additional confirmatory testing may be necessary for epidemiological  and / or clinical management purposes  to differentiate between  SARS-CoV-2 and other Sarbecovirus currently known to infect humans.  If clinically indicated additional testing with an alternate test  methodology [phone]) is advised. The SARS-CoV-2 RNA is generally  detectable in upper and  lower respiratory sp ecimens during the acute  phase of infection. The expected result is Negative. Fact Sheet for Patients:  StrictlyIdeas.no Fact Sheet for Healthcare Providers: BankingDealers.co.za This test is not yet approved or cleared by the [place] FDA and has been authorized for detection and/or diagnosis of SARS-CoV-2 by FDA under an Emergency Use Authorization (EUA).  This EUA will remain in effect (meaning this test can be used) for the duration of the COVID-19 declaration under Section 564(b)(1) of the Act, 21 U.S.C. section 360bbb-3(b)(1), unless the authorization is terminated or revoked sooner. Performed at [facility], [place] [address], [place], [place] [address]   Troponin I (High Sensitivity)     Status: Abnormal   Collection Time: 04/19/19  3:46 PM  Result Value Ref Range   Troponin I (High Sensitivity) 31 (H) <18 ng/L    Comment: (NOTE) Elevated high sensitivity troponin I (hsTnI) values and significant  changes across serial measurements may suggest ACS but many other  chronic and acute conditions are known to elevate hsTnI results.  Refer to the "Links" section for chest pain algorithms and additional  guidance. Performed at [facility], [place] [address], [place], [place] [address]    Dg Chest 2 View  Result Date: 04/19/2019 CLINICAL DATA:  Short of breath EXAM: CHEST - 2 VIEW COMPARISON:  03/15/2019 FINDINGS: Cardiac enlargement without heart failure or edema. No infiltrate or effusion. Lungs remain hyperinflated and unchanged from the prior study. IMPRESSION: Cardiac enlargement without acute abnormality. Electronically Signed   By: Franchot Gallo M.D.   On: 04/19/2019 15:43    Pending Labs [employer] (From admission, onward)    Start     Ordered   Signed and Occupational hygienist morning,   R     Signed and Held   Signed and Held  CBC  Tomorrow morning,   R     Signed and Held          Vitals/Pain Today's Vitals   04/19/19 1651 04/19/19 1700 04/19/19 1730 04/19/19 1800  BP: (!) 158/100 (!) 158/95 (!) 162/88 (!) 154/89  Pulse: 77  77 70  Resp: 16 20 20  (!) 22  Temp:      TempSrc:      SpO2: 96%  96% 95%  PainSc:        Isolation Precautions No active isolations  Medications Medications  albuterol (PROVENTIL) (2.5 MG/3ML) 0.083% nebulizer solution 2.5 mg (has no administration in time range)    Mobility walks

## 2019-04-19 NOTE — ED Notes (Signed)
Rpt called to [employer]

## 2019-04-19 NOTE — Progress Notes (Signed)
ANTICOAGULATION CONSULT NOTE - Initial Consult  Pharmacy Consult for Coumadin Indication: history of DVT  No Known Allergies  Patient Measurements: Height: 5\' 8"  (172.7 cm) Weight: 218 lb 4.1 oz (99 kg) IBW/kg (Calculated) : 68.4  Vital Signs: Temp: 98 F (36.7 C) (07/27 1826) Temp Source: Oral (07/27 1340) BP: 180/91 (07/27 1826) Pulse Rate: 78 (07/27 1834)  Labs: Recent Labs    04/19/19 1346 04/19/19 1408 04/19/19 1546  HGB 8.8*  --   --   HCT 27.0*  --   --   PLT 146*  --   --   LABPROT  --  29.3*  --   INR  --  2.8*  --   CREATININE 5.73*  --   --   TROPONINIHS 34*  --  31*    Estimated Creatinine Clearance: 12.5 mL/min (A) (by C-G formula based on SCr of 5.73 mg/dL (H)).   Medical History: Past Medical History:  Diagnosis Date  . Chronic kidney disease    renal insufficiency  . Coronary artery disease   . Diabetes mellitus    type 2-non-insulin  . GERD (gastroesophageal reflux disease)   . Gout   . Hypertension   . Obesity   . Prostate cancer ([place])   . Tobacco abuse counseling     Medications:  PTA- Coumadin 7.5mg  daily except 5mg  on Tues/Thurs- last dose 7/26 PM.  Assessment: 78 yo M admitted with shortness of breath on chronic Coumadin for hx DVT.  INR therapeutic on admission (INR = 2.8).  CBC: Hg low at 8.8, pltc low at 146.  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  Hg/pltc consistent with labs from 1 month ago.  No bleeding noted. Low baseline likely due to anemia of chronic disease.   Goal of Therapy:  INR 2-3   Plan:  Coumadin 7.5mg  po x1 today per home regimen Daily INR  Vinicius Brockman, Lavonia Drafts 04/19/2019,7:38 PM

## 2019-04-19 NOTE — ED Triage Notes (Signed)
Per pt, states he went to [place] ;ast week for SOB upon exertion-was told he need to have a stent placed in his heart-states he has a history of stent placement-

## 2019-04-19 NOTE — H&P (Signed)
History and Physical    [name] HCW:[id number] DOB: March 07, 1941 DOA: 04/19/2019  PCP: Felipa Eth, MD  Patient coming from: home   I have personally briefly reviewed patient's old medical records available.   Chief Complaint: Shortness of breath.  HPI: [name] is a 78 y.o. male with medical history significant of hypertension, prostate cancer, CKD stage V, coronary artery disease, type 2 diabetes not on treatment and gout, history of acute DVT currently on Coumadin presenting to the emergency room with gradually progressing shortness of breath for about 1 week.  According to the patient, for the last 1 to 2 weeks he has been progressively more short of breath, gradually worsening symptoms, occasional wheezing but no cough or fever, more so at nighttime without any chest pain.  He cannot quantify whether he has any weight gain or not.  But no leg swelling.  He walks around the house and gets short of breath on going around the bathroom. Denies any cough or fever.  Denies any chills.  Denies any sputum production.  Is a smoker. With this symptoms, he went to [place] last week, he reports that his echocardiogram showed a valve problem and he was told that he will need a cardiac stent but did not do a stent so he was told to come [facility] if any problem. Patient follows up with nephrology at [facility], he has been discussed about need for hemodialysis in the future but has not been started on it. ED Course: Afebrile, blood pressures stable.  Hemoglobin 8.8 which is gradually decreasing from previous hemoglobin.  No melena or hematochezia.  BUN and creatinine 52/5.73.  Potassium is normal.  INR is 2.8.  Chest x-ray is essentially clear.  Does not show any pulmonary edema or effusion.  EKG shows multiple nonspecific changes, no definite ST-T wave changes.  High sensitive troponins are just borderline elevated.  Review of Systems: all systems are reviewed and pertinent positive as per HPI  otherwise rest are negative.    Past Medical History:  Diagnosis Date  . Chronic kidney disease    renal insufficiency  . Coronary artery disease   . Diabetes mellitus    type 2-non-insulin  . GERD (gastroesophageal reflux disease)   . Gout   . Hypertension   . Obesity   . Prostate cancer ([place])   . Tobacco abuse counseling     Past Surgical History:  Procedure Laterality Date  . CARDIAC CATHETERIZATION  02/08/02  . CARDIAC CATHETERIZATION  11/20/10   drug eluting stent;mid left LAD, There is mild hypokinesis of the anterolateral wall. LVEF  is estimated at 50-55%  . INSERTION OF BRONCHIAL STENT     went in groin to R chest  . PENILE PROSTHESIS IMPLANT     '05     reports that he has been smoking cigarettes. He has a 25.00 pack-year smoking history. He has never used smokeless tobacco. He reports previous alcohol use. He reports previous drug use. Drugs: Cocaine and Marijuana.  No Known Allergies  Family History  Problem Relation Age of Onset  . Aneurysm Mother   . Multiple myeloma Mother   . Aneurysm Father   . Cancer Brother   . Prostate cancer Brother   . Cancer Other   . Prostate cancer Brother   . Bone cancer Brother   . Breast cancer Neg Hx   . Colon cancer Neg Hx   . Pancreatic cancer Neg Hx      Prior to Admission  medications   Medication Sig Start Date End Date Taking? Authorizing Provider  allopurinol (ZYLOPRIM) 100 MG tablet Take 100 mg by mouth 2 (two) times daily.     [provider]  amLODipine (NORVASC) 10 MG tablet Take 10 mg by mouth at bedtime.     [provider]  aspirin EC 81 MG tablet Take 81 mg by mouth daily.    [provider]  atorvastatin (LIPITOR) 40 MG tablet Take 1 tablet (40 mg total) by mouth daily at 6 PM. Patient not taking: Reported on 03/15/2019 03/30/17   Rogue Bussing, MD  cholecalciferol (VITAMIN D) 1000 UNITS tablet Take 1,000 Units by mouth daily.    [provider]  docusate  sodium (COLACE) 100 MG capsule Take 100 mg by mouth daily.    [provider]  gabapentin (NEURONTIN) 100 MG capsule Take 2 capsules (200 mg total) by mouth at bedtime. Patient taking differently: Take 100 mg by mouth at bedtime.  03/30/17   Rogue Bussing, MD  latanoprost (XALATAN) 0.005 % ophthalmic solution Place 1 drop into both eyes at bedtime.    [provider]  losartan (COZAAR) 50 MG tablet Take 50 mg by mouth at bedtime.     [provider]  nitroGLYCERIN (NITROSTAT) 0.4 MG SL tablet Place 0.4 mg under the tongue every 5 (five) minutes as needed. For chest pain    [provider]  omeprazole (PRILOSEC) 20 MG capsule Take 20 mg by mouth daily.    [provider]  terazosin (HYTRIN) 5 MG capsule Take 5 mg by mouth every morning.    [provider]  timolol (BETIMOL) 0.5 % ophthalmic solution Place 1 drop into both eyes daily.    [provider]  warfarin (COUMADIN) 5 MG tablet Take 2 tablets (10 mg total) by mouth daily. Patient taking differently: Take 5-7.5 mg by mouth See admin instructions. Take mg on Monday, Wednesday, Friday, Saturday, and Sunday, take 90m on Tuesday and Thursday 06/21/18   GCarlisle Cater PA-C    Physical Exam: Vitals:   04/19/19 1340 04/19/19 1527 04/19/19 1651  BP: 137/90 132/88 (!) 158/100  Pulse: 78 72 77  Resp: 19 18 16   Temp: 99.1 F (37.3 C)    TempSrc: Oral    SpO2: 96% 97% 96%    Constitutional: NAD, calm, comfortable Vitals:   04/19/19 1340 04/19/19 1527 04/19/19 1651  BP: 137/90 132/88 (!) 158/100  Pulse: 78 72 77  Resp: 19 18 16   Temp: 99.1 F (37.3 C)    TempSrc: Oral    SpO2: 96% 97% 96%   Eyes: PERRL, lids and conjunctivae normal ENMT: Mucous membranes are moist. Posterior pharynx clear of any exudate or lesions.Normal dentition.  Neck: normal, supple, no masses, no thyromegaly Respiratory: clear to auscultation bilaterally, no wheezing, no crackles.  In moderate  respiratory distress, on 2 L oxygen.  No accessory muscle use.  Cardiovascular: Regular rate and rhythm, no murmurs / rubs / gallops. No extremity edema. 2+ pedal pulses. No carotid bruits.  Abdomen: no tenderness, no masses palpated. No hepatosplenomegaly. Bowel sounds positive.  Musculoskeletal: no clubbing / cyanosis. No joint deformity upper and lower extremities. Good ROM, no contractures. Normal muscle tone.  Skin: no rashes, lesions, ulcers. No induration Neurologic: CN 2-12 grossly intact. Sensation intact, DTR normal. Strength 5/5 in all 4.  Psychiatric: Normal judgment and insight. Alert and oriented x 3. Normal mood.     Labs on Admission: I have personally reviewed  following labs and imaging studies  CBC: Recent Labs  Lab 04/19/19 1346  WBC 7.2  HGB 8.8*  HCT 27.0*  MCV 93.1  PLT 625*   Basic Metabolic Panel: Recent Labs  Lab 04/19/19 1346  NA 139  K 4.7  CL 111  CO2 20*  GLUCOSE 117*  BUN 52*  CREATININE 5.73*  CALCIUM 8.5*   GFR: CrCl cannot be calculated (Unknown ideal weight.). Liver Function Tests: No results for input(s): AST, ALT, ALKPHOS, BILITOT, PROT, ALBUMIN in the last 168 hours. No results for input(s): LIPASE, AMYLASE in the last 168 hours. No results for input(s): AMMONIA in the last 168 hours. Coagulation Profile: Recent Labs  Lab 04/19/19 1408  INR 2.8*   Cardiac Enzymes: No results for input(s): CKTOTAL, CKMB, CKMBINDEX, TROPONINI in the last 168 hours. BNP (last 3 results) No results for input(s): PROBNP in the last 8760 hours. HbA1C: No results for input(s): HGBA1C in the last 72 hours. CBG: No results for input(s): GLUCAP in the last 168 hours. Lipid Profile: No results for input(s): CHOL, HDL, LDLCALC, TRIG, CHOLHDL, LDLDIRECT in the last 72 hours. Thyroid Function Tests: No results for input(s): TSH, T4TOTAL, FREET4, T3FREE, THYROIDAB in the last 72 hours. Anemia Panel: No results for input(s): VITAMINB12, FOLATE,  FERRITIN, TIBC, IRON, RETICCTPCT in the last 72 hours. Urine analysis:    Component Value Date/Time   COLORURINE YELLOW 03/15/2019 1644   APPEARANCEUR CLEAR 03/15/2019 1644   LABSPEC 1.013 03/15/2019 1644   PHURINE 5.0 03/15/2019 1644   GLUCOSEU 50 (A) 03/15/2019 1644   HGBUR SMALL (A) 03/15/2019 1644   BILIRUBINUR NEGATIVE 03/15/2019 1644   KETONESUR NEGATIVE 03/15/2019 1644   PROTEINUR >=300 (A) 03/15/2019 1644   NITRITE NEGATIVE 03/15/2019 1644   LEUKOCYTESUR NEGATIVE 03/15/2019 1644    Radiological Exams on Admission: Dg Chest 2 View  Result Date: 04/19/2019 CLINICAL DATA:  Short of breath EXAM: CHEST - 2 VIEW COMPARISON:  03/15/2019 FINDINGS: Cardiac enlargement without heart failure or edema. No infiltrate or effusion. Lungs remain hyperinflated and unchanged from the prior study. IMPRESSION: Cardiac enlargement without acute abnormality. Electronically Signed   By: Franchot Gallo M.D.   On: 04/19/2019 15:43    EKG: Independently reviewed.  Sinus rhythm.  Fusion complexes.  No acute ST changes.  Assessment/Plan Principal Problem:   Shortness of breath at rest Active Problems:   Coronary artery disease   Hypertension   Chronic kidney disease   Chest pain     1.  Dyspnea on exertion: Exact cause unknown. Patient is on Coumadin and therapeutic, less likely pulmonary embolism. Patient has extensive cardiovascular problems, possibility of anginal symptoms and dyspnea due to cardiac cause. We will admit to monitored unit.  Continue aspirin, therapeutic on Coumadin, currently no ongoing chest pain and no need for heparin infusion.  Repeat EKG and troponins. We will order 2D echocardiogram.  Will consult cardiology service in the morning to get their recommendation.  Will try bronchodilator with history of smoking.  Will check proBNP.  2.  CKD stage V: With anemia of chronic disease and progressive renal failure.  Patient is making adequate urine.  Currently no indication for  urgent hemodialysis.  Potassium is normal.  He will follow-up with his urologist.  However, if patient needs any cardiac cath or any procedures related to contrast dye, he may end up in dialysis and I communicated this with the patient.  3.  Hypertension: Blood pressures are stable.  We will continue all his medications.  Holding ARB's for fluctuating renal function.  4.  Type 2 diabetes: Patient is stated he is not needing any treatment.  Last known A1c was normal.   DVT prophylaxis: Coumadin Code Status: Full code Family Communication: None Disposition Plan: Home after hospitalization Consults called: None Admission status: Observation   Barb Merino MD [facility] Pager [phone]  If 7PM-7AM, please contact night-coverage [email or web address] Password [facility]  04/19/2019, 5:36 PM

## 2019-04-19 NOTE — ED Provider Notes (Signed)
[facility] DEPT Provider Note   CSN: [id number] Arrival date & time: 04/19/19  1330    History   Chief Complaint Chief Complaint  Patient presents with  . Shortness of Breath    HPI [name] is a 78 y.o. male.     HPI Patient presents with shortness of breath.  States that for a while now he has had shortness of breath with exertion.  States he cannot do much activity and then gets short of breath.  No real chest pain with it.  States that last week he had a stress test at the [place].  He states that he was called in a day or 2 later told he had to have a heart stent.  States they do not do the stent there so he came here.  Previous had a heart stent done here. Also has had an anemia.  1 month ago seen in the ER for same.  Also previous DVT.  He is on anticoagulation.  Denies chest pain.  States he does not have the results of the stress test with him.  No fevers.  No cough.  He does smoke. Past Medical History:  Diagnosis Date  . Chronic kidney disease    renal insufficiency  . Coronary artery disease   . Diabetes mellitus    type 2-non-insulin  . GERD (gastroesophageal reflux disease)   . Gout   . Hypertension   . Obesity   . Prostate cancer ([place])   . Tobacco abuse counseling     Patient Active Problem List   Diagnosis Date Noted  . Malignant neoplasm of prostate ([place]) 07/06/2018  . Hypoglycemia 03/29/2017  . Coronary artery disease   . Hypertension   . Diabetes mellitus   . Chronic kidney disease     Past Surgical History:  Procedure Laterality Date  . CARDIAC CATHETERIZATION  02/08/02  . CARDIAC CATHETERIZATION  11/20/10   drug eluting stent;mid left LAD, There is mild hypokinesis of the anterolateral wall. LVEF  is estimated at 50-55%  . INSERTION OF BRONCHIAL STENT     went in groin to R chest  . PENILE PROSTHESIS IMPLANT     '05        Home Medications    Prior to Admission medications   Medication Sig Start Date  End Date Taking? Authorizing Provider  allopurinol (ZYLOPRIM) 100 MG tablet Take 100 mg by mouth 2 (two) times daily.     [provider]  amLODipine (NORVASC) 10 MG tablet Take 10 mg by mouth at bedtime.     [provider]  aspirin EC 81 MG tablet Take 81 mg by mouth daily.    [provider]  atorvastatin (LIPITOR) 40 MG tablet Take 1 tablet (40 mg total) by mouth daily at 6 PM. Patient not taking: Reported on 03/15/2019 03/30/17   Rogue Bussing, MD  cholecalciferol (VITAMIN D) 1000 UNITS tablet Take 1,000 Units by mouth daily.    [provider]  docusate sodium (COLACE) 100 MG capsule Take 100 mg by mouth daily.    [provider]  enoxaparin (LOVENOX) 40 MG/0.4ML injection Inject 0.95 mLs (95 mg total) into the skin daily. Patient not taking: Reported on 03/15/2019 06/21/18   Carlisle Cater, PA-C  gabapentin (NEURONTIN) 100 MG capsule Take 2 capsules (200 mg total) by mouth at bedtime. Patient taking differently: Take 100 mg by mouth at bedtime.  03/30/17   Rogue Bussing, MD  latanoprost (XALATAN) 0.005 %  ophthalmic solution Place 1 drop into both eyes at bedtime.    [provider]  losartan (COZAAR) 50 MG tablet Take 50 mg by mouth at bedtime.     [provider]  nitroGLYCERIN (NITROSTAT) 0.4 MG SL tablet Place 0.4 mg under the tongue every 5 (five) minutes as needed. For chest pain    [provider]  omeprazole (PRILOSEC) 20 MG capsule Take 20 mg by mouth daily.    [provider]  terazosin (HYTRIN) 5 MG capsule Take 5 mg by mouth every morning.    [provider]  timolol (BETIMOL) 0.5 % ophthalmic solution Place 1 drop into both eyes daily.    [provider]  warfarin (COUMADIN) 5 MG tablet Take 2 tablets (10 mg total) by mouth daily. Patient taking differently: Take 5-7.5 mg by mouth See admin instructions. Take mg on Monday, Wednesday, Friday, Saturday, and Sunday,  take 61m on Tuesday and Thursday 06/21/18   GCarlisle Cater PA-C    Family History Family History  Problem Relation Age of Onset  . Aneurysm Mother   . Multiple myeloma Mother   . Aneurysm Father   . Cancer Brother   . Prostate cancer Brother   . Cancer Other   . Prostate cancer Brother   . Bone cancer Brother   . Breast cancer Neg Hx   . Colon cancer Neg Hx   . Pancreatic cancer Neg Hx     Social History Social History   Tobacco Use  . Smoking status: Current Every Day Smoker    Packs/day: 0.50    Years: 50.00    Pack years: 25.00    Types: Cigarettes  . Smokeless tobacco: Never Used  Substance Use Topics  . Alcohol use: Not Currently    Comment: drank 5 beers 03-28-17  . Drug use: Not Currently    Types: Cocaine, Marijuana     Allergies   Patient has no known allergies.   Review of Systems Review of Systems  Constitutional: Negative for appetite change.  HENT: Negative for congestion.   Respiratory: Positive for shortness of breath.   Cardiovascular: Negative for chest pain and leg swelling.  Gastrointestinal: Negative for abdominal pain.  Genitourinary: Negative for flank pain.  Musculoskeletal: Negative for back pain.  Skin: Negative for rash.  Neurological: Negative for weakness.  Psychiatric/Behavioral: Negative for confusion.     Physical Exam Updated Vital Signs BP 137/90 (BP Location: Left Arm)   Pulse 78   Temp 99.1 F (37.3 C) (Oral)   Resp 19   SpO2 96%   Physical Exam Vitals signs and nursing note reviewed.  HENT:     Head: Normocephalic.  Eyes:     Extraocular Movements: Extraocular movements intact.  Cardiovascular:     Rate and Rhythm: Normal rate and regular rhythm.  Pulmonary:     Breath sounds: Wheezing present.     Comments: Some wheezes on left lung field. Chest:     Chest wall: No tenderness or crepitus.  Abdominal:     Tenderness: There is no guarding or rebound.  Musculoskeletal:     Left lower leg: Edema present.   Skin:    General: Skin is warm.     Capillary Refill: Capillary refill takes less than 2 seconds.  Neurological:     General: No focal deficit present.     Mental Status: He is alert.      ED Treatments / Results  Labs (all labs ordered are listed, but only  abnormal results are displayed) Labs Reviewed  BASIC METABOLIC PANEL - Abnormal; Notable for the following components:      Result Value   CO2 20 (*)    Glucose, Bld 117 (*)    BUN 52 (*)    Creatinine, Ser 5.73 (*)    Calcium 8.5 (*)    GFR calc non Af Amer 9 (*)    GFR calc Af Amer 10 (*)    All other components within normal limits  CBC - Abnormal; Notable for the following components:   RBC 2.90 (*)    Hemoglobin 8.8 (*)    HCT 27.0 (*)    Platelets 146 (*)    All other components within normal limits  D-DIMER, QUANTITATIVE (NOT AT [facility]) - Abnormal; Notable for the following components:   D-Dimer, Quant 0.81 (*)    All other components within normal limits  PROTIME-INR - Abnormal; Notable for the following components:   Prothrombin Time 29.3 (*)    INR 2.8 (*)    All other components within normal limits  TROPONIN I (HIGH SENSITIVITY) - Abnormal; Notable for the following components:   Troponin I (High Sensitivity) 34 (*)    All other components within normal limits  SARS CORONAVIRUS 2 (HOSPITAL ORDER, [place] [facility])  TROPONIN I (HIGH SENSITIVITY)    EKG EKG Interpretation  Date/Time:  Monday April 19 2019 13:41:38 EDT Ventricular Rate:  71 PR Interval:  228 QRS Duration: 134 QT Interval:  448 QTC Calculation: 486 R Axis:   -65 Text Interpretation:  Sinus rhythm with 1st degree A-V block with Fusion complexes Possible Left atrial enlargement Left axis deviation Non-specific intra-ventricular conduction block Cannot rule out Anteroseptal infarct , age undetermined T wave abnormality, consider lateral ischemia Abnormal ECG Confirmed by Davonna Belling [phone]) on 04/19/2019 1:56:07  PM   Radiology No results found.  Procedures Procedures (including critical care time)  Medications Ordered in ED Medications - No data to display   Initial Impression / Assessment and Plan / ED Course  I have reviewed the triage vital signs and the nursing notes.  Pertinent labs & imaging results that were available during my care of the patient were reviewed by me and considered in my medical decision making (see chart for details).        Patient presents with shortness of breath.  States he had a stress test at the [place] and was told that he had to get a heart stent.  No chest pain.  However does have a worsening anemia.  Seen recently ER for same and guaiac negative at that time.  He is on Coumadin at baseline.  Also has had previous DVT and does have some mild swelling on his left lower leg compared to the right.  Also creatinine of 5.  D-dimer mildly elevated but cannot get a CTA at this time.  He is however therapeutic on his INR.  Feels patient would benefit from [place] to the hospital.  Will need clarification on the status of the stress test but if patient is correct still may need some sort of intervention, however with kidney function worsening anemia likely needs an medicine admission.  Will discuss with hospitalist.  Final Clinical Impressions(s) / ED Diagnoses   Final diagnoses:  Shortness of breath  Anemia, unspecified type    ED Discharge Orders    None       Davonna Belling, MD 04/19/19 1519

## 2019-04-20 ENCOUNTER — Observation Stay (HOSPITAL_BASED_OUTPATIENT_CLINIC_OR_DEPARTMENT_OTHER): Payer: No Typology Code available for payment source

## 2019-04-20 DIAGNOSIS — I5041 Acute combined systolic (congestive) and diastolic (congestive) heart failure: Secondary | ICD-10-CM

## 2019-04-20 DIAGNOSIS — R0602 Shortness of breath: Secondary | ICD-10-CM

## 2019-04-20 DIAGNOSIS — I340 Nonrheumatic mitral (valve) insufficiency: Secondary | ICD-10-CM

## 2019-04-20 DIAGNOSIS — N185 Chronic kidney disease, stage 5: Secondary | ICD-10-CM

## 2019-04-20 DIAGNOSIS — I25118 Atherosclerotic heart disease of native coronary artery with other forms of angina pectoris: Secondary | ICD-10-CM

## 2019-04-20 DIAGNOSIS — I10 Essential (primary) hypertension: Secondary | ICD-10-CM

## 2019-04-20 DIAGNOSIS — D649 Anemia, unspecified: Secondary | ICD-10-CM | POA: Diagnosis not present

## 2019-04-20 DIAGNOSIS — E1169 Type 2 diabetes mellitus with other specified complication: Secondary | ICD-10-CM

## 2019-04-20 DIAGNOSIS — I447 Left bundle-branch block, unspecified: Secondary | ICD-10-CM

## 2019-04-20 DIAGNOSIS — E669 Obesity, unspecified: Secondary | ICD-10-CM

## 2019-04-20 LAB — ECHOCARDIOGRAM COMPLETE
Height: 68 in
Weight: 3534.4 oz

## 2019-04-20 LAB — BASIC METABOLIC PANEL
Anion gap: 11 (ref 5–15)
BUN: 52 mg/dL — ABNORMAL HIGH (ref 8–23)
CO2: 18 mmol/L — ABNORMAL LOW (ref 22–32)
Calcium: 8.8 mg/dL — ABNORMAL LOW (ref 8.9–10.3)
Chloride: 112 mmol/L — ABNORMAL HIGH (ref 98–111)
Creatinine, Ser: 5.65 mg/dL — ABNORMAL HIGH (ref 0.61–1.24)
GFR calc Af Amer: 10 mL/min — ABNORMAL LOW (ref >60)
GFR calc non Af Amer: 9 mL/min — ABNORMAL LOW (ref >60)
Glucose, Bld: 83 mg/dL (ref 70–99)
Potassium: 4.5 mmol/L (ref 3.5–5.1)
Sodium: 141 mmol/L (ref 135–145)

## 2019-04-20 LAB — PROTIME-INR
INR: 2.9 — ABNORMAL HIGH (ref 0.8–1.2)
Prothrombin Time: 29.7 seconds — ABNORMAL HIGH (ref 11.4–15.2)

## 2019-04-20 LAB — CBC
HCT: 30.3 % — ABNORMAL LOW (ref 39.0–52.0)
Hemoglobin: 9.7 g/dL — ABNORMAL LOW (ref 13.0–17.0)
MCH: 30.1 pg (ref 26.0–34.0)
MCHC: 32 g/dL (ref 30.0–36.0)
MCV: 94.1 fL (ref 80.0–100.0)
Platelets: 162 10*3/uL (ref 150–400)
RBC: 3.22 MIL/uL — ABNORMAL LOW (ref 4.22–5.81)
RDW: 15.3 % (ref 11.5–15.5)
WBC: 8.3 10*3/uL (ref 4.0–10.5)
nRBC: 0 % (ref 0.0–0.2)

## 2019-04-20 MED ORDER — OXYCODONE-ACETAMINOPHEN 5-325 MG PO TABS
1.0000 | ORAL_TABLET | ORAL | Status: DC | PRN
  Administered 2019-04-27: 1 via ORAL
  Filled 2019-04-20: qty 2

## 2019-04-20 MED ORDER — WARFARIN SODIUM 5 MG PO TABS
5.0000 mg | ORAL_TABLET | Freq: Once | ORAL | Status: AC
  Administered 2019-04-20: 5 mg via ORAL
  Filled 2019-04-20: qty 1

## 2019-04-20 MED ORDER — ISOSORB DINITRATE-HYDRALAZINE 20-37.5 MG PO TABS
1.0000 | ORAL_TABLET | Freq: Three times a day (TID) | ORAL | Status: DC
  Administered 2019-04-20 – 2019-04-28 (×20): 1 via ORAL
  Filled 2019-04-20 (×21): qty 1

## 2019-04-20 NOTE — Progress Notes (Signed)
PROGRESS NOTE    [name]  GGE:[id number] DOB: [birth year] DOA: 04/19/2019 PCP: Felipa Eth, MD  Brief Narrative:  [name] is a 78 y.o. male with medical history significant of hypertension, prostate cancer, CKD stage V, coronary artery disease, type 2 diabetes not on treatment and gout, history of acute DVT currently on Coumadin presenting to the emergency room with gradually progressing shortness of breath for about 1 week.  According to the patient, for the last 1 to 2 weeks he has been progressively more short of breath, gradually worsening symptoms, occasional wheezing but no cough or fever, more so at nighttime without any chest pain.    Assessment & Plan:   Principal Problem:   Shortness of breath at rest Active Problems:   Coronary artery disease   Hypertension   Chronic kidney disease   Chest pain   Dyspnea on exertion; Patient reports shortness of breath on talking and has wheezes on exam. Echocardiogram ordered to evaluate left ventricular function. Patient will probably need a stress test versus cardiac cath deferred to cardiology. Nasal cannula oxygen to keep sats greater than 90%.    End-stage renal disease Nephrology will be consulted to see if dialysis needs to be initiated.    History of coronary artery disease with history of PCI to LAD in 2012 Continue with aspirin    Type 2 diabetes mellitus Get A1c .   Anemia of chronic disease Transfuse to keep hemoglobin greater than 7.  History of DVT On Coumadin with therapeutic INR Hypertension Suboptimally controlled currently on Norvasc 10 mg, losartan 50, PRN hydralazine.  DVT prophylaxis: Coumadin code Status full code Family Communication: None at bedside Disposition Plan: Pending clinical improvement  Consultants:   Cardiology  Procedures: None Antimicrobials: None  Subjective: No chest pain , still very short of breath on 2 L of nasal cannula oxygen  Objective: Vitals:   04/20/19 0900 04/20/19 1007 04/20/19 1402 04/20/19 1412  BP:  (!) 149/87 (!) 154/88   Pulse:  72 71   Resp:   18   Temp:   98 F (36.7 C)   TempSrc:   Oral   SpO2: 95% 96% 96% 92%  Weight:      Height:        Intake/Output Summary (Last 24 hours) at 04/20/2019 1554 Last data filed at 04/20/2019 1000 Gross per 24 hour  Intake 350 ml  Output 1600 ml  Net -1250 ml   Filed Weights   04/19/19 1826 04/20/19 0700  Weight: 99 kg 100.2 kg    Examination:  General exam: Mild distress from shortness of breath on 2 L of oxygen. Respiratory system: Diminished air entry at bases, scattered wheezing present Cardiovascular system: S1 & S2 heard, RRR.  Gastrointestinal system: Abdomen is nondistended, soft and nontender. Normal bowel sounds heard. Central nervous system: Alert and oriented. No focal neurological deficits. Extremities: Symmetric 5 x 5 power. Skin: No rashes, lesions or ulcers Psychiatry: . Mood & affect appropriate.     Data Reviewed: I have personally reviewed following labs and imaging studies  CBC: Recent Labs  Lab 04/19/19 1346 04/20/19 0446  WBC 7.2 8.3  HGB 8.8* 9.7*  HCT 27.0* 30.3*  MCV 93.1 94.1  PLT 146* 465   Basic Metabolic Panel: Recent Labs  Lab 04/19/19 1346 04/20/19 0446  NA 139 141  K 4.7 4.5  CL 111 112*  CO2 20* 18*  GLUCOSE 117* 83  BUN 52* 52*  CREATININE 5.73* 5.65*  CALCIUM 8.5* 8.8*  GFR: Estimated Creatinine Clearance: 12.8 mL/min (A) (by C-G formula based on SCr of 5.65 mg/dL (H)). Liver Function Tests: No results for input(s): AST, ALT, ALKPHOS, BILITOT, PROT, ALBUMIN in the last 168 hours. No results for input(s): LIPASE, AMYLASE in the last 168 hours. No results for input(s): AMMONIA in the last 168 hours. Coagulation Profile: Recent Labs  Lab 04/19/19 1408 04/20/19 0446  INR 2.8* 2.9*   Cardiac Enzymes: No results for input(s): CKTOTAL, CKMB, CKMBINDEX, TROPONINI in the last 168 hours. BNP (last 3 results) No  results for input(s): PROBNP in the last 8760 hours. HbA1C: No results for input(s): HGBA1C in the last 72 hours. CBG: No results for input(s): GLUCAP in the last 168 hours. Lipid Profile: No results for input(s): CHOL, HDL, LDLCALC, TRIG, CHOLHDL, LDLDIRECT in the last 72 hours. Thyroid Function Tests: No results for input(s): TSH, T4TOTAL, FREET4, T3FREE, THYROIDAB in the last 72 hours. Anemia Panel: No results for input(s): VITAMINB12, FOLATE, FERRITIN, TIBC, IRON, RETICCTPCT in the last 72 hours. Sepsis Labs: No results for input(s): PROCALCITON, LATICACIDVEN in the last 168 hours.  Recent Results (from the past 240 hour(s))  SARS Coronavirus 2 (CEPHEID - Performed in [place] hospital lab), Hosp Order     Status: None   Collection Time: 04/19/19  2:08 PM   Specimen: Nasopharyngeal Swab  Result Value Ref Range Status   SARS Coronavirus 2 NEGATIVE NEGATIVE Final    Comment: (NOTE) If result is NEGATIVE SARS-CoV-2 target nucleic acids are NOT DETECTED. The SARS-CoV-2 RNA is generally detectable in upper and lower  respiratory specimens during the acute phase of infection. The lowest  concentration of SARS-CoV-2 viral copies this assay can detect is 250  copies / mL. A negative result does not preclude SARS-CoV-2 infection  and should not be used as the sole basis for treatment or other  patient management decisions.  A negative result may occur with  improper specimen collection / handling, submission of specimen other  than nasopharyngeal swab, presence of viral mutation(s) within the  areas targeted by this assay, and inadequate number of viral copies  (<250 copies / mL). A negative result must be combined with clinical  observations, patient history, and epidemiological information. If result is POSITIVE SARS-CoV-2 target nucleic acids are DETECTED. The SARS-CoV-2 RNA is generally detectable in upper and lower  respiratory specimens dur ing the acute phase of infection.   Positive  results are indicative of active infection with SARS-CoV-2.  Clinical  correlation with patient history and other diagnostic information is  necessary to determine patient infection status.  Positive results do  not rule out bacterial infection or co-infection with other viruses. If result is PRESUMPTIVE POSTIVE SARS-CoV-2 nucleic acids MAY BE PRESENT.   A presumptive positive result was obtained on the submitted specimen  and confirmed on repeat testing.  While 2019 novel coronavirus  (SARS-CoV-2) nucleic acids may be present in the submitted sample  additional confirmatory testing may be necessary for epidemiological  and / or clinical management purposes  to differentiate between  SARS-CoV-2 and other Sarbecovirus currently known to infect humans.  If clinically indicated additional testing with an alternate test  methodology [phone]) is advised. The SARS-CoV-2 RNA is generally  detectable in upper and lower respiratory sp ecimens during the acute  phase of infection. The expected result is Negative. Fact Sheet for Patients:  StrictlyIdeas.no Fact Sheet for Healthcare Providers: BankingDealers.co.za This test is not yet approved or cleared by the [place] FDA and has  been authorized for detection and/or diagnosis of SARS-CoV-2 by FDA under an Emergency Use Authorization (EUA).  This EUA will remain in effect (meaning this test can be used) for the duration of the COVID-19 declaration under Section 564(b)(1) of the Act, 21 U.S.C. section 360bbb-3(b)(1), unless the authorization is terminated or revoked sooner. Performed at [facility], [place] [address] [place], [place] [address]          Radiology Studies: Dg Chest 2 View  Result Date: 04/19/2019 CLINICAL DATA:  Short of breath EXAM: CHEST - 2 VIEW COMPARISON:  03/15/2019 FINDINGS: Cardiac enlargement without heart failure or edema. No  infiltrate or effusion. Lungs remain hyperinflated and unchanged from the prior study. IMPRESSION: Cardiac enlargement without acute abnormality. Electronically Signed   By: Franchot Gallo M.D.   On: 04/19/2019 15:43        Scheduled Meds: . allopurinol  100 mg Oral BID  . amLODipine  10 mg Oral QHS  . aspirin EC  81 mg Oral Daily  . cholecalciferol  1,000 Units Oral Daily  . docusate sodium  100 mg Oral Daily  . gabapentin  100 mg Oral QHS  . ipratropium-albuterol  3 mL Nebulization Q6H  . isosorbide-hydrALAZINE  1 tablet Oral TID  . mouth rinse  15 mL Mouth Rinse BID  . pantoprazole  40 mg Oral Daily  . terazosin  5 mg Oral q morning - 10a  . warfarin  5 mg Oral ONCE-1800  . Warfarin - Pharmacist Dosing Inpatient   Does not apply q1800   Continuous Infusions:   LOS: 0 days    Time spent: 35 minutes.     Hosie Poisson, MD [facility] Pager [phone]   If 7PM-7AM, please contact night-coverage [email or web address] Password [email or web address] 04/20/2019, 3:54 PM

## 2019-04-20 NOTE — Consult Note (Addendum)
Cardiology Consultation:   Patient ID: [name] MRN: [id number]; DOB: 1940-12-27  Admit date: 04/19/2019 Date of Consult: 04/20/2019  Primary Care Provider: Felipa Eth, MD Primary Cardiologist: [facility] cardiology Primary Electrophysiologist: none   Patient Profile:   [name] is a 78 y.o. 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 Karleen Hampshire.  History of Present Illness:   [name] has a past medical history as above.  He apparently has been seen by Dr. Acie Fredrickson in the past.  He underwent cardiac catheterization 11/21/2010 that revealed mid LAD stenosis of 80% and DES was placed.  He was noted to have left bundle branch block at the time.  Per prior notes patient presented to the [facility] emergency department yesterday with complaints of 1 to 2 weeks of progressively worsening shortness of breath.  It was noted that the patient went to the [place] last week for evaluation of his symptoms and reported that an echocardiogram showed a valve problem and that he was told he would need a cardiac stent.  He is followed by nephrology at the [facility] and there has been discussion of need for dialysis in the future.  Upon my interview with the patient he has a little bit of a hard time with recall and telling me why he is here at the hospital.  After some thought he tells me that [facility] and told that he needed to come in and have his heart checked.  He apparently came in for a stress test last week, however they were unable to gain IV access.  He says that they just did an EKG.  He says that labs were done and his cardiologist told him that he was going to need to come to [facility] to get started on dialysis.  He tells me that he has had no  shortness of breath or chest pain recently until he got to the hospital yesterday.  He developed shortness of breath after being here which he says is improved after breathing treatments.  He currently denies any shortness of breath.  He does have wheezing on exam.  He also has 1+ pedal edema, not above the ankles.  He denies any orthopnea or PND.  He says that he is here to get his kidneys checked.  [name] lives with his girlfriend and reports that he does very little activity.  He is noted no recent increase in dyspnea on exertion or activity intolerance.  He smokes about 4 cigarettes/day.  He denies alcohol use.  He has a history of heroin and cocaine use, last time was 5-6 years ago.   Heart Pathway Score:     Past Medical History:  Diagnosis Date  . Chronic kidney disease    renal insufficiency  . Coronary artery disease   . Diabetes mellitus    type 2-non-insulin  . GERD (gastroesophageal reflux disease)   . Gout   . Hypertension   . Obesity   . Prostate cancer ([name])   . Tobacco abuse counseling     Past Surgical History:  Procedure Laterality Date  . CARDIAC CATHETERIZATION  02/08/02  . CARDIAC CATHETERIZATION  11/20/10   drug eluting stent;mid left LAD, There is  mild hypokinesis of the anterolateral wall. LVEF  is estimated at 50-55%  . INSERTION OF BRONCHIAL STENT     went in groin to R chest  . PENILE PROSTHESIS IMPLANT     '05     Home Medications:  Prior to Admission medications   Medication Sig Start Date End Date Taking? Authorizing Provider  allopurinol (ZYLOPRIM) 100 MG tablet Take 100 mg by mouth 2 (two) times daily.    Yes [provider]  amLODipine (NORVASC) 10 MG tablet Take 10 mg by mouth at bedtime.    Yes [provider]  aspirin EC 81 MG tablet Take 81 mg by mouth daily.   Yes [provider]  cholecalciferol (VITAMIN D) 1000 UNITS tablet Take 1,000 Units by mouth daily.   Yes [provider]  docusate sodium  (COLACE) 100 MG capsule Take 100 mg by mouth daily.   Yes [provider]  gabapentin (NEURONTIN) 100 MG capsule Take 2 capsules (200 mg total) by mouth at bedtime. Patient taking differently: Take 100 mg by mouth at bedtime.  03/30/17  Yes Rogue Bussing, MD  losartan (COZAAR) 50 MG tablet Take 50 mg by mouth at bedtime.    Yes [provider]  omeprazole (PRILOSEC) 20 MG capsule Take 20 mg by mouth daily.   Yes [provider]  terazosin (HYTRIN) 5 MG capsule Take 5 mg by mouth every morning.   Yes [provider]  warfarin (COUMADIN) 5 MG tablet Take 2 tablets (10 mg total) by mouth daily. Patient taking differently: Take 5-7.5 mg by mouth See admin instructions. Take 1.5 tablets (7.5 mg) on (Monday, Wednesday, Friday, Saturday, and Sunday) & Take 1 tablet (5 mg) on Tuesday and Thursday 06/21/18  Yes Carlisle Cater, PA-C  nitroGLYCERIN (NITROSTAT) 0.4 MG SL tablet Place 0.4 mg under the tongue every 5 (five) minutes as needed for chest pain. For chest pain     [provider]    Inpatient Medications: Scheduled Meds: . allopurinol  100 mg Oral BID  . amLODipine  10 mg Oral QHS  . aspirin EC  81 mg Oral Daily  . cholecalciferol  1,000 Units Oral Daily  . docusate sodium  100 mg Oral Daily  . gabapentin  100 mg Oral QHS  . ipratropium-albuterol  3 mL Nebulization Q6H  . mouth rinse  15 mL Mouth Rinse BID  . pantoprazole  40 mg Oral Daily  . terazosin  5 mg Oral q morning - 10a  . Warfarin - Pharmacist Dosing Inpatient   Does not apply q1800   Continuous Infusions:  PRN Meds: albuterol, nitroGLYCERIN, oxyCODONE-acetaminophen  Allergies:   No Known Allergies  Social History:   Social History   Socioeconomic History  . Marital status: Married    Spouse name: Not on file  . Number of children: 3  . Years of education: Not on file  . Highest education level: Not on file  Occupational History  . Occupation: retired  Photographer  . Financial resource strain: Not on file  . Food insecurity    Worry: Not on file    Inability: Not on file  . Transportation needs    Medical: Not on file    Non-medical: Not on file  Tobacco Use  . Smoking status: Current Every Day Smoker    Packs/day: 0.50    Years: 50.00    Pack years: 25.00    Types: Cigarettes  . Smokeless tobacco: Never Used  Substance  and Sexual Activity  . Alcohol use: Not Currently    Comment: drank 5 beers 03-28-17  . Drug use: Not Currently    Types: Cocaine, Marijuana  . Sexual activity: Yes  Lifestyle  . Physical activity    Days per week: Not on file    Minutes per session: Not on file  . Stress: Not on file  Relationships  . Social Herbalist on phone: Not on file    Gets together: Not on file    Attends religious service: Not on file    Active member of club or organization: Not on file    Attends meetings of clubs or organizations: Not on file    Relationship status: Not on file  . Intimate partner violence    Fear of current or ex partner: Not on file    Emotionally abused: Not on file    Physically abused: Not on file    Forced sexual activity: Not on file  Other Topics Concern  . Not on file  Social History Narrative  . Not on file    Family History:    Family History  Problem Relation Age of Onset  . Aneurysm Mother   . Multiple myeloma Mother   . Aneurysm Father   . Cancer Brother   . Prostate cancer Brother   . Cancer Other   . Prostate cancer Brother   . Bone cancer Brother   . Breast cancer Neg Hx   . Colon cancer Neg Hx   . Pancreatic cancer Neg Hx      ROS:  Please see the history of present illness.   All other ROS reviewed and negative.     Physical Exam/Data:   Vitals:   04/19/19 2335 04/20/19 0159 04/20/19 0529 04/20/19 0700  BP: (!) 159/97  (!) 166/99   Pulse:   73   Resp:   18   Temp:   98 F (36.7 C)   TempSrc:   Oral   SpO2:  96% 95%   Weight:    100.2 kg  Height:         Intake/Output Summary (Last 24 hours) at 04/20/2019 0858 Last data filed at 04/20/2019 0500 Gross per 24 hour  Intake -  Output 1375 ml  Net -1375 ml   Last 3 Weights 04/20/2019 04/19/2019 03/15/2019  Weight (lbs) 220 lb 14.4 oz 218 lb 4.1 oz 219 lb  Weight (kg) 100.2 kg 99 kg 99.338 kg     Body mass index is 33.59 kg/m.  General:  Well nourished, well developed, in no acute distress HEENT: normal Lymph: no adenopathy Neck: no JVD Endocrine:  No thryomegaly Vascular: No carotid bruits; FA pulses 2+ bilaterally without bruits  Cardiac:  normal S1, S2; RRR; no murmur  Lungs: Expiratory wheezes present bilaterally Abd: soft, nontender, no hepatomegaly  Ext: 1+ pitting pedal edema, none above ankles Musculoskeletal:  No deformities, BUE and BLE strength normal and equal Skin: warm and dry  Neuro:  CNs 2-12 intact, no focal abnormalities noted, he is a little slow with recall Psych:  Normal affect   EKG:  The EKG was personally reviewed and demonstrates:  Sinus rhythm with 1st degree A-V block, 71 bpm, abnormal R wave progression, possible Left atrial enlargement, Left axis deviation, Non-specific intra-ventricular conduction block Telemetry:  Telemetry was personally reviewed and demonstrates: Sinus rhythm in the 70s with PVCs, one 5 beat run of NSVT  Relevant CV Studies:  Cardiac catheterization 11/21/2010  Mid LAD stenosis of 80% with subsequent DES (3.0 x 20 mm Promus) placed in mid LAD with ultrasound guidance.  Residual 80-90% stenosis in large first diagonal, circumflex and RCA with at most 30-40% stenosis, normal      left ventricular systolic function.  Laboratory Data:  High Sensitivity Troponin:   Recent Labs  Lab 04/19/19 1346 04/19/19 1546 04/19/19 2314  TROPONINIHS 34* 31* 35*     Cardiac EnzymesNo results for input(s): TROPONINI in the last 168 hours. No results for input(s): TROPIPOC in the last 168 hours.  Chemistry Recent Labs  Lab 04/19/19 1346  04/20/19 0446  NA 139 141  K 4.7 4.5  CL 111 112*  CO2 20* 18*  GLUCOSE 117* 83  BUN 52* 52*  CREATININE 5.73* 5.65*  CALCIUM 8.5* 8.8*  GFRNONAA 9* 9*  GFRAA 10* 10*  ANIONGAP 8 11    No results for input(s): PROT, ALBUMIN, AST, ALT, ALKPHOS, BILITOT in the last 168 hours. Hematology Recent Labs  Lab 04/19/19 1346 04/20/19 0446  WBC 7.2 8.3  RBC 2.90* 3.22*  HGB 8.8* 9.7*  HCT 27.0* 30.3*  MCV 93.1 94.1  MCH 30.3 30.1  MCHC 32.6 32.0  RDW 15.5 15.3  PLT 146* 162   BNPNo results for input(s): BNP, PROBNP in the last 168 hours.  DDimer  Recent Labs  Lab 04/19/19 1408  DDIMER 0.81*     Radiology/Studies:  Dg Chest 2 View  Result Date: 04/19/2019 CLINICAL DATA:  Short of breath EXAM: CHEST - 2 VIEW COMPARISON:  03/15/2019 FINDINGS: Cardiac enlargement without heart failure or edema. No infiltrate or effusion. Lungs remain hyperinflated and unchanged from the prior study. IMPRESSION: Cardiac enlargement without acute abnormality. Electronically Signed   By: Franchot Gallo M.D.   On: 04/19/2019 15:43    Assessment and Plan:   Dyspnea on exertion -Patient denies dyspnea on exertion to me.  He says he developed dyspnea after he arrived to the hospital, relieved with nebulizer treatments.  Patient denies any chest pain or pressure.  He does have wheezes present on exam.  Patient says that he presented to the ED as he was told he would need dialysis by his [place] cardiologist. -High-sensitivity troponins, low level-flat pattern 31-35. -EKG with TWI laterally, not significantly changed from prior. -Chest x-ray shows cardiac enlargement without acute abnormality. -COVID-19 test negative. -Echocardiogram in process, evaluate LV function and wall motion. -Patient reports that he was to have a stress test last week however this was not able to be done due to loss of IV access.  He may benefit from a stress test at some point.  Would not pursue cardiac catheterization at this time  due to renal function.  CKD stage V -Followed by nephrology at the [facility]. Patient says that after labs last week he was told by his cardiologist that he would need hemodialysis.  He says he presented to the hospital for further evaluation of his kidney function. -Per ED note in 02/2019, baseline creatinine in the threes. SCr on 03/15/2019 was 5.71. -Currently serum creatinine 5.73, potassium 4.7 -Possible need for nephrology consult and obtain records from the [place].  CAD -History of DES to the LAD 2012.  Previously followed by Dr. Acie Fredrickson, now followed at the [facility]. -Medical therapy includes aspirin 81 mg.  He says that he is on a statin, not currently on his list.  Patient will have his girlfriend at home provide [place] with an updated medication list. -Denies recent chest pain.  He  had mild shortness of breath after arriving to the hospital yesterday which was improved with nebulizer treatments.  Hypertension -Patient is followed at the [place].  Home medications include amlodipine 10 mg, losartan 50 mg.  ARB is currently on hold due to renal function. -Blood pressure was fairly well controlled on presentation, has been elevated overnight, 166/99 this morning. -We will add hydralazine 10 mg IV as needed elevated blood pressure.  Diabetes type 2 -Not currently on any treatment with reportedly normal A1c.  Anemia of chronic disease -Hemoglobin 8.8 on presentation, 9.7 this morning.  Stable.  History of DVT -On chronic Coumadin therapy.  INR therapeutic at 2.8. -D-dimer is 0.81.  Primary team feels PE is very unlikely considering his therapeutic anticoagulation.  Tobacco abuse -Longtime 4 cigarettes/day smoker. -Advised on cessation.  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     For questions or updates, please contact [place] Please consult [email or web address] for contact info under     Signed, Daune Perch, NP   04/20/2019  8:58 AM   I have seen and examined the patient along with Daune Perch, NP .  I have reviewed the chart, notes and new data.  I agree with NP's note.  Key new complaints: Obese elderly gentleman with type 2 diabetes mellitus and hypertension, history of tobacco use, history of DVT (September 2019) on warfarin anticoagulation, known history of CAD (LAD 80% stenosis treated with drug-eluting stent 2012), without recent complaints of angina, newly developed shortness of breath over the last several weeks.  Since hospitalization has improved with bronchodilators, has not received diuretics.  Advanced chronic kidney disease, approaching indication for hemodialysis.  Apparently plans are being set in place for AV fistula insertion.  Sees Carney Bern, MD at the [facility] in [place] and a nuclear stress test was planned, not successful due to IV access issues.  Key examination changes: Obese, obvious jugular venous distention roughly 7-8 cm above the sternal angle, regular rate and rhythm with paradoxically split second heart sound, no significant murmurs, clear lungs Key new findings / data: Creatinine 5.7, electrolytes normal.  ECG nondiagnostic due to chronic (at least since 2012) left bundle branch block (QRS 134 ms), in sinus rhythm  PLAN:  1. CHF: LVEF 30-35%, profound asynchrony due to left bundle branch block, clinically hypervolemic, on my opinion also with echo evidence of volume overload (almost restrictive filling) although symptoms improved without diuretic therapy.  Limited options for medical therapy due to advanced chronic kidney disease, relative bradycardia and hypervolemic status.  Suggest hydralazine/nitrates (BiDil).  Theoretically, he would benefit from implantation of CRT device for resynchronization, but impending need for hemodialysis may be a challenge due to the increased risk of infection. 2. CAD: Statistically this is by far the most likely reason for heart failure,  although he has not had problems with angina pectoris.  Coronary angiography would be the most efficient way to evaluate his coronary situation, but would almost certainly lead to immediate need for hemodialysis and permanent end-stage renal disease.  Would like to consult with nephrology before we do this.  In addition he is anticoagulated for DVT (that occurred almost a year ago) and immediate coronary geography would not be possible.  We will get a [employer], as Dr. Raynelle Dick had tried to do at the [facility].  If there is clear evidence of ischemic changes, will have to commit to court angiography with appropriate  assistance from the nephrology team. 3. LBBB: Biventricular pacing would be beneficial from a hemodynamic standpoint, but if he is too soon to go on hemodialysis, the risk of infection of a transvenous device is substantial. 4. CKD 5: Approaching need for hemodialysis. 5. HTN: Hydralazine/nitrates appear to be optimal choice at this point.  ARB stopped.  Once he is euvolemic carvedilol would be indicated, but the use of beta-blockers may be limited by bradycardia.   Sanda Klein, MD, [place] [phone] 04/20/2019, 2:03 PM

## 2019-04-20 NOTE — Progress Notes (Signed)
  Echocardiogram 2D Echocardiogram has been performed.  [name] 04/20/2019, 9:40 AM

## 2019-04-20 NOTE — Progress Notes (Signed)
Spoke with Bernard Level, RN with Cardiology at the [facility]. She reports that the patient was already being worked up by the [place] for a heart cath at [facility], knowing that the patient will require dialysis after the heart cath. She asked for an update on the plan of care. Reported to her that we are waiting on the Cardiology team to see the patient here to determine the plan of care. RN will call [name] back after this occurs. She faxed over an updated medication list per the patients request and a Cardiology H&P. Placed information on shadow chart and called pharmacy to update PTA medication list.  Casper Harrison 04/20/2019 11:30 AM

## 2019-04-20 NOTE — Progress Notes (Signed)
Pt called RN to room w/ onset of L chest pain 7/10. Chest pain not effected by pressure or deep breathing. BP 171/110. Nitro x1 given and EKG obtained. Pt denies relief. On call provider notified and nitro x1 given. Pt's BP 161/100 and pain unchanged. Third nitro given and new orders received. Pt began to feel some relief of pain and orders for morphine given. Pt more comfortable, will monitor.

## 2019-04-20 NOTE — Progress Notes (Signed)
   04/20/19 1007  Vitals  BP (!) 149/87  MAP (mmHg) 107  BP Location Left Arm  BP Method Automatic  Patient Position (if appropriate) Sitting  Pulse Rate 72  Pulse Rate Source Monitor  Oxygen Therapy  SpO2 96 %  O2 Device Nasal Cannula  O2 Flow Rate (L/min) 2 L/min  MEWS Score  MEWS RR 0  MEWS Pulse 0  MEWS Systolic 0  MEWS LOC 0  MEWS Temp 0  MEWS Score 0  MEWS Score Color Green   Patient had 9 beats of wide QRS. Patient denies chest pain. Vital signs stable as above. Will continue to monitor. MD made aware.

## 2019-04-20 NOTE — Progress Notes (Addendum)
[place] for Warfarin  Indication: history of DVT  No Known Allergies  Patient Measurements: Height: 5\' 8"  (172.7 cm) Weight: 220 lb 14.4 oz (100.2 kg) IBW/kg (Calculated) : 68.4  Vital Signs: Temp: 98 F (36.7 C) (07/28 0529) Temp Source: Oral (07/28 0529) BP: 149/87 (07/28 1007) Pulse Rate: 72 (07/28 1007)  Labs: Recent Labs    04/19/19 1346 04/19/19 1408 04/19/19 1546 04/19/19 2314 04/20/19 0446  HGB 8.8*  --   --   --  9.7*  HCT 27.0*  --   --   --  30.3*  PLT 146*  --   --   --  162  LABPROT  --  29.3*  --   --  29.7*  INR  --  2.8*  --   --  2.9*  CREATININE 5.73*  --   --   --  5.65*  TROPONINIHS 34*  --  31* 35*  --     Estimated Creatinine Clearance: 12.8 mL/min (A) (by C-G formula based on SCr of 5.65 mg/dL (H)).   Medical History: Past Medical History:  Diagnosis Date  . Chronic kidney disease    renal insufficiency  . Coronary artery disease   . Diabetes mellitus    type 2-non-insulin  . GERD (gastroesophageal reflux disease)   . Gout   . Hypertension   . Obesity   . Prostate cancer ([place])   . Tobacco abuse counseling     Medications:  PTA Warfarin 7.5mg  daily except 5mg  on Tues/Thurs- last dose 7/26 PM.  Assessment: 37 y/oM admitted with shortness of breath. PMH includes hx of DVT for which he is on warfarin, CKD, CAD, DM, HTN. INR therapeutic on admission (INR = 2.8). Pharmacy consulted for warfarin dosing while patient admitted.   Today, 04/20/19: INR = 2.9  CBC: Hgb 9.7 (consistent with labs from 1 month ago), Pltc WNL No bleeding noted   Goal of Therapy:  INR 2-3   Plan:  Warfarin 5mg  PO x 1 today Daily PT/INR Monitor closely for s/sx of bleeding   Lindell Spar, PharmD, BCPS Clinical Pharmacist  04/20/2019,11:48 AM

## 2019-04-20 NOTE — Care Management Obs Status (Signed)
[place] NOTIFICATION   Patient Details  Name: Drezden Seitzinger MRN: [id number] Date of Birth: 1940/10/21   Medicare Observation Status Notification Given:  Yes    Joaquin Courts, RN 04/20/2019, 11:29 AM

## 2019-04-21 ENCOUNTER — Encounter (HOSPITAL_COMMUNITY)
Admit: 2019-04-21 | Discharge: 2019-04-21 | Disposition: A | Discharge status: Home / Self Care | Payer: No Typology Code available for payment source | Attending: Cardiovascular Disease | Admitting: Cardiovascular Disease

## 2019-04-21 DIAGNOSIS — I5041 Acute combined systolic (congestive) and diastolic (congestive) heart failure: Secondary | ICD-10-CM | POA: Diagnosis not present

## 2019-04-21 DIAGNOSIS — I472 Ventricular tachycardia: Secondary | ICD-10-CM | POA: Diagnosis not present

## 2019-04-21 DIAGNOSIS — I2510 Atherosclerotic heart disease of native coronary artery without angina pectoris: Secondary | ICD-10-CM | POA: Diagnosis present

## 2019-04-21 DIAGNOSIS — Z955 Presence of coronary angioplasty implant and graft: Secondary | ICD-10-CM | POA: Diagnosis not present

## 2019-04-21 DIAGNOSIS — Z86718 Personal history of other venous thrombosis and embolism: Secondary | ICD-10-CM | POA: Diagnosis not present

## 2019-04-21 DIAGNOSIS — I471 Supraventricular tachycardia: Secondary | ICD-10-CM | POA: Diagnosis not present

## 2019-04-21 DIAGNOSIS — E872 Acidosis: Secondary | ICD-10-CM | POA: Diagnosis present

## 2019-04-21 DIAGNOSIS — T82855A Stenosis of coronary artery stent, initial encounter: Secondary | ICD-10-CM | POA: Diagnosis present

## 2019-04-21 DIAGNOSIS — Z79818 Long term (current) use of other agents affecting estrogen receptors and estrogen levels: Secondary | ICD-10-CM | POA: Diagnosis not present

## 2019-04-21 DIAGNOSIS — I509 Heart failure, unspecified: Secondary | ICD-10-CM

## 2019-04-21 DIAGNOSIS — I5023 Acute on chronic systolic (congestive) heart failure: Secondary | ICD-10-CM | POA: Diagnosis present

## 2019-04-21 DIAGNOSIS — K219 Gastro-esophageal reflux disease without esophagitis: Secondary | ICD-10-CM | POA: Diagnosis present

## 2019-04-21 DIAGNOSIS — Z7982 Long term (current) use of aspirin: Secondary | ICD-10-CM | POA: Diagnosis not present

## 2019-04-21 DIAGNOSIS — D649 Anemia, unspecified: Secondary | ICD-10-CM | POA: Diagnosis not present

## 2019-04-21 DIAGNOSIS — Z7901 Long term (current) use of anticoagulants: Secondary | ICD-10-CM | POA: Diagnosis not present

## 2019-04-21 DIAGNOSIS — E1122 Type 2 diabetes mellitus with diabetic chronic kidney disease: Secondary | ICD-10-CM | POA: Diagnosis present

## 2019-04-21 DIAGNOSIS — I429 Cardiomyopathy, unspecified: Secondary | ICD-10-CM | POA: Diagnosis present

## 2019-04-21 DIAGNOSIS — I132 Hypertensive heart and chronic kidney disease with heart failure and with stage 5 chronic kidney disease, or end stage renal disease: Secondary | ICD-10-CM | POA: Diagnosis present

## 2019-04-21 DIAGNOSIS — I25118 Atherosclerotic heart disease of native coronary artery with other forms of angina pectoris: Secondary | ICD-10-CM | POA: Diagnosis not present

## 2019-04-21 DIAGNOSIS — F17210 Nicotine dependence, cigarettes, uncomplicated: Secondary | ICD-10-CM | POA: Diagnosis present

## 2019-04-21 DIAGNOSIS — Z20828 Contact with and (suspected) exposure to other viral communicable diseases: Secondary | ICD-10-CM | POA: Diagnosis present

## 2019-04-21 DIAGNOSIS — D631 Anemia in chronic kidney disease: Secondary | ICD-10-CM | POA: Diagnosis present

## 2019-04-21 DIAGNOSIS — N2581 Secondary hyperparathyroidism of renal origin: Secondary | ICD-10-CM | POA: Diagnosis present

## 2019-04-21 DIAGNOSIS — Z79899 Other long term (current) drug therapy: Secondary | ICD-10-CM | POA: Diagnosis not present

## 2019-04-21 DIAGNOSIS — I259 Chronic ischemic heart disease, unspecified: Secondary | ICD-10-CM | POA: Diagnosis not present

## 2019-04-21 DIAGNOSIS — N186 End stage renal disease: Secondary | ICD-10-CM | POA: Diagnosis present

## 2019-04-21 DIAGNOSIS — I5021 Acute systolic (congestive) heart failure: Secondary | ICD-10-CM

## 2019-04-21 DIAGNOSIS — R0602 Shortness of breath: Secondary | ICD-10-CM | POA: Diagnosis present

## 2019-04-21 DIAGNOSIS — E785 Hyperlipidemia, unspecified: Secondary | ICD-10-CM | POA: Diagnosis present

## 2019-04-21 DIAGNOSIS — I10 Essential (primary) hypertension: Secondary | ICD-10-CM | POA: Diagnosis not present

## 2019-04-21 DIAGNOSIS — M109 Gout, unspecified: Secondary | ICD-10-CM | POA: Diagnosis present

## 2019-04-21 DIAGNOSIS — N185 Chronic kidney disease, stage 5: Secondary | ICD-10-CM | POA: Diagnosis not present

## 2019-04-21 DIAGNOSIS — Y832 Surgical operation with anastomosis, bypass or graft as the cause of abnormal reaction of the patient, or of later complication, without mention of misadventure at the time of the procedure: Secondary | ICD-10-CM | POA: Diagnosis present

## 2019-04-21 DIAGNOSIS — G4733 Obstructive sleep apnea (adult) (pediatric): Secondary | ICD-10-CM | POA: Diagnosis present

## 2019-04-21 LAB — BASIC METABOLIC PANEL
Anion gap: 11 (ref 5–15)
BUN: 58 mg/dL — ABNORMAL HIGH (ref 8–23)
CO2: 17 mmol/L — ABNORMAL LOW (ref 22–32)
Calcium: 8.6 mg/dL — ABNORMAL LOW (ref 8.9–10.3)
Chloride: 109 mmol/L (ref 98–111)
Creatinine, Ser: 5.54 mg/dL — ABNORMAL HIGH (ref 0.61–1.24)
GFR calc Af Amer: 11 mL/min — ABNORMAL LOW (ref >60)
GFR calc non Af Amer: 9 mL/min — ABNORMAL LOW (ref >60)
Glucose, Bld: 152 mg/dL — ABNORMAL HIGH (ref 70–99)
Potassium: 4.8 mmol/L (ref 3.5–5.1)
Sodium: 137 mmol/L (ref 135–145)

## 2019-04-21 LAB — NM MYOCAR MULTI W/SPECT W/WALL MOTION / EF
Estimated workload: 1 METS
Exercise duration (min): 5 min
Exercise duration (sec): 1 s
MPHR: 144 {beats}/min
Peak HR: 103 {beats}/min
Percent HR: 71 %
Rest HR: 74 {beats}/min

## 2019-04-21 LAB — GLUCOSE, CAPILLARY
Glucose-Capillary: 88 mg/dL (ref 70–99)
Glucose-Capillary: 118 mg/dL — ABNORMAL HIGH (ref 70–99)
Glucose-Capillary: 92 mg/dL (ref 70–99)

## 2019-04-21 LAB — PROTIME-INR
INR: 3.1 — ABNORMAL HIGH (ref 0.8–1.2)
Prothrombin Time: 31.3 seconds — ABNORMAL HIGH (ref 11.4–15.2)

## 2019-04-21 MED ORDER — REGADENOSON 0.4 MG/5ML IV SOLN
0.4000 mg | Freq: Once | INTRAVENOUS | Status: AC
  Administered 2019-04-21: 10:00:00 0.4 mg via INTRAVENOUS

## 2019-04-21 MED ORDER — SODIUM BICARBONATE 650 MG PO TABS
650.0000 mg | ORAL_TABLET | Freq: Every day | ORAL | Status: DC
  Administered 2019-04-21 – 2019-04-24 (×4): 650 mg via ORAL
  Filled 2019-04-21 (×4): qty 1

## 2019-04-21 MED ORDER — CARVEDILOL 3.125 MG PO TABS
3.1250 mg | ORAL_TABLET | Freq: Two times a day (BID) | ORAL | Status: DC
  Administered 2019-04-21: 3.125 mg via ORAL
  Filled 2019-04-21 (×2): qty 1

## 2019-04-21 MED ORDER — REGADENOSON 0.4 MG/5ML IV SOLN
INTRAVENOUS | Status: AC
  Administered 2019-04-21: 0.4 mg via INTRAVENOUS
  Filled 2019-04-21: qty 5

## 2019-04-21 MED ORDER — INSULIN ASPART 100 UNIT/ML ~~LOC~~ SOLN: 0.0000 [IU] | Freq: Every day | SUBCUTANEOUS | Status: DC

## 2019-04-21 MED ORDER — PHYTONADIONE 5 MG PO TABS
2.5000 mg | ORAL_TABLET | Freq: Once | ORAL | Status: AC
  Administered 2019-04-21: 2.5 mg via ORAL
  Filled 2019-04-21: qty 1

## 2019-04-21 MED ORDER — IPRATROPIUM-ALBUTEROL 0.5-2.5 (3) MG/3ML IN SOLN
3.0000 mL | Freq: Three times a day (TID) | RESPIRATORY_TRACT | Status: DC
  Administered 2019-04-21 – 2019-04-22 (×2): 3 mL via RESPIRATORY_TRACT
  Filled 2019-04-21 (×2): qty 3

## 2019-04-21 MED ORDER — TECHNETIUM TC 99M TETROFOSMIN IV KIT
30.0000 | PACK | Freq: Once | INTRAVENOUS | Status: AC | PRN
  Administered 2019-04-21: 10:00:00 30 via INTRAVENOUS

## 2019-04-21 MED ORDER — FUROSEMIDE 10 MG/ML IJ SOLN
60.0000 mg | Freq: Once | INTRAMUSCULAR | Status: AC
  Administered 2019-04-21: 60 mg via INTRAVENOUS
  Filled 2019-04-21: qty 6

## 2019-04-21 MED ORDER — INSULIN ASPART 100 UNIT/ML ~~LOC~~ SOLN
0.0000 [IU] | Freq: Three times a day (TID) | SUBCUTANEOUS | Status: DC
  Administered 2019-04-23: 2 [IU] via SUBCUTANEOUS
  Administered 2019-04-26: 18:00:00 3 [IU] via SUBCUTANEOUS
  Administered 2019-04-27: 2 [IU] via SUBCUTANEOUS
  Administered 2019-04-27: 3 [IU] via SUBCUTANEOUS

## 2019-04-21 MED ORDER — TECHNETIUM TC 99M TETROFOSMIN IV KIT
10.0000 | PACK | Freq: Once | INTRAVENOUS | Status: AC | PRN
  Administered 2019-04-21: 10 via INTRAVENOUS

## 2019-04-21 MED ORDER — AMLODIPINE BESYLATE 5 MG PO TABS
5.0000 mg | ORAL_TABLET | Freq: Every day | ORAL | Status: DC
  Administered 2019-04-21: 5 mg via ORAL
  Filled 2019-04-21: qty 1

## 2019-04-21 NOTE — Consult Note (Signed)
[facility] Renal Consultation Note  Requesting MD: Karleen Hampshire  Indication for Consultation:  CKD  Chief complaint: shortness of breath   HPI:  [name] is a 78 y.o. 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  He has also noticed swelling in his feet.  Cardiology is seeing him for acute systolic CHF with EF of 78-29%.  He states prior heart stent placement in 2012.  They are considering a cath and they note that the dye load could likely ultimately put the patient on dialysis.  Creatinine trends as below: [facility].  The patient does not have a fistula in place.  He states that he has had vein mapping for a fistula but hasn't had a fistula placed.  He states that he was told that his veins may be too small for a fistula.  He thinks his recent kidney function is "10%".  He last saw nephrology last year in December 2019 and last had labs in December.  Dr. Gae Dry ?   He is not sure of the cause of his CKD but does have HTN and DM; he is now off of insulin as he passed out with hypoglycemia twice.  He states he does have changes to his eyes from diabetes.  He is not on lasix at home.  Breathing has gotten better which he attributes to inhalers but he still feels swollen.  He provides the number of [phone] for the [place].    Creatinine  Date/Time Value Ref Range Status  07/03/2018 03:15 PM 3.07 (HH) 0.61 - 1.24 mg/dL Final    Comment:    CRITICAL RESULT CALLED TO, READ BACK BY AND VERIFIED WITH: Tonie Bryant,RN at 1618 by Prudencio Burly    Creatinine, Ser  Date/Time Value Ref Range Status  04/21/2019 02:19 PM 5.54 (H) 0.61 - 1.24 mg/dL Final  04/20/2019 04:46 AM 5.65 (H) 0.61 - 1.24 mg/dL Final  04/19/2019 01:46 PM 5.73 (H) 0.61 - 1.24 mg/dL Final  03/15/2019  04:43 PM 5.71 (H) 0.61 - 1.24 mg/dL Final  06/21/2018 05:17 PM 3.14 (H) 0.61 - 1.24 mg/dL Final  03/30/2017 03:53 AM 2.35 (H) 0.61 - 1.24 mg/dL Final  03/29/2017 01:00 PM 2.30 (H) 0.61 - 1.24 mg/dL Final  03/29/2017 12:43 PM 2.35 (H) 0.61 - 1.24 mg/dL Final  09/29/2012 06:25 PM 1.24 0.50 - 1.35 mg/dL Final  09/26/2012 11:59 PM 1.29 0.50 - 1.35 mg/dL Final  12/14/2010 03:45 AM 1.33 0.4 - 1.5 mg/dL Final  11/22/2010 04:25 AM 1.13 0.4 - 1.5 mg/dL Final  11/21/2010 01:32 AM 1.23 0.4 - 1.5 mg/dL Final  11/20/2010 01:11 PM 1.8 (H) 0.4 - 1.5 mg/dL Final  11/20/2010 01:10 PM 1.83 (H) 0.4 - 1.5 mg/dL Final     PMHx:   Past Medical History:  Diagnosis Date  . Chronic kidney disease    renal insufficiency  . Coronary artery disease   . Diabetes mellitus    type 2-non-insulin  . GERD (gastroesophageal reflux disease)   . Gout   . Hypertension   . Obesity   . Prostate cancer ([place])   . Tobacco abuse counseling     Past Surgical History:  Procedure Laterality Date  . CARDIAC CATHETERIZATION  02/08/02  . CARDIAC CATHETERIZATION  11/20/10   drug eluting stent;mid left  LAD, There is mild hypokinesis of the anterolateral wall. LVEF  is estimated at 50-55%  . INSERTION OF BRONCHIAL STENT     went in groin to R chest  . PENILE PROSTHESIS IMPLANT     '05    Family Hx:  Family History  Problem Relation Age of Onset  . Aneurysm Mother   . Multiple myeloma Mother   . Aneurysm Father   . Cancer Brother   . Prostate cancer Brother   . Cancer Other   . Prostate cancer Brother   . Bone cancer Brother   . Breast cancer Neg Hx   . Colon cancer Neg Hx   . Pancreatic cancer Neg Hx     Social History:  reports that he has been smoking cigarettes. He has a 25.00 pack-year smoking history. He has never used smokeless tobacco. He reports previous alcohol use. He reports previous drug use. Drugs: Cocaine and Marijuana.  Allergies: No Known Allergies  Medications: Prior to Admission  medications   Medication Sig Start Date End Date Taking? Authorizing Provider  allopurinol (ZYLOPRIM) 100 MG tablet Take 100 mg by mouth 2 (two) times daily.    Yes [provider]  amLODipine (NORVASC) 5 MG tablet Take 5 mg by mouth daily.   Yes [provider]  aspirin EC 81 MG tablet Take 81 mg by mouth daily.   Yes [provider]  atorvastatin (LIPITOR) 40 MG tablet Take 40 mg by mouth at bedtime.   Yes [provider]  calcitRIOL (ROCALTROL) 0.25 MCG capsule Take 0.5 mcg by mouth daily.   Yes [provider]  cholecalciferol (VITAMIN D) 1000 UNITS tablet Take 1,000 Units by mouth daily.   Yes [provider]  docusate sodium (COLACE) 100 MG capsule Take 100 mg by mouth daily.   Yes [provider]  ferrous gluconate (FERGON) 324 MG tablet Take 324 mg by mouth every Monday, Wednesday, and Friday.   Yes [provider]  gabapentin (NEURONTIN) 400 MG capsule Take 400 mg by mouth at bedtime.   Yes [provider]  losartan (COZAAR) 100 MG tablet Take 100 mg by mouth daily.   Yes [provider]  nitroGLYCERIN (NITROSTAT) 0.4 MG SL tablet Place 0.4 mg under the tongue every 5 (five) minutes as needed for chest pain. For chest pain    Yes [provider]  omeprazole (PRILOSEC) 20 MG capsule Take 20 mg by mouth daily.   Yes [provider]  terazosin (HYTRIN) 5 MG capsule Take 5 mg by mouth every morning.   Yes [provider]  warfarin (COUMADIN) 5 MG tablet Take 2 tablets (10 mg total) by mouth daily. Patient taking differently: Take 5-7.5 mg by mouth See admin instructions. Take 1.5 tablets (7.5 mg) on (Monday, Wednesday, Friday, Saturday, and Sunday) & Take 1 tablet (5 mg) on Tuesday and Thursday 06/21/18  Yes Carlisle Cater, PA-C  gabapentin (NEURONTIN) 100 MG capsule Take 2 capsules (200 mg total) by mouth at bedtime. Patient not taking: Reported on 04/20/2019 03/30/17   Rogue Bussing, MD    I have reviewed the patient's current medications.  Labs:  BMP Latest Ref Rng & Units 04/21/2019 04/20/2019 04/19/2019  Glucose 70 - 99 mg/dL 152(H) 83 117(H)  BUN 8 - 23 mg/dL 58(H) 52(H) 52(H)  Creatinine 0.61 - 1.24 mg/dL 5.54(H) 5.65(H) 5.73(H)  Sodium 135 - 145 mmol/L 137 141 139  Potassium 3.5 - 5.1 mmol/L 4.8 4.5 4.7  Chloride 98 -  111 mmol/L 109 112(H) 111  CO2 22 - 32 mmol/L 17(L) 18(L) 20(L)  Calcium 8.9 - 10.3 mg/dL 8.6(L) 8.8(L) 8.5(L)    Urinalysis    Component Value Date/Time   COLORURINE YELLOW 03/15/2019 [place] 03/15/2019 1644   LABSPEC 1.013 03/15/2019 1644   PHURINE 5.0 03/15/2019 1644   GLUCOSEU 50 (A) 03/15/2019 1644   HGBUR SMALL (A) 03/15/2019 1644   BILIRUBINUR NEGATIVE 03/15/2019 [place] 03/15/2019 1644   PROTEINUR >=300 (A) 03/15/2019 1644   NITRITE NEGATIVE 03/15/2019 1644   LEUKOCYTESUR NEGATIVE 03/15/2019 1644     ROS:  Pertinent items noted in HPI and remainder of comprehensive ROS otherwise negative.  Physical Exam: Vitals:   04/21/19 0426 04/21/19 1324  BP: (!) 131/92   Pulse: 75 (!) 107  Resp: 18 17  Temp: 98.1 F (36.7 C)   SpO2: 94% 97%     General: adult male in bed in NAD HEENT: NCAT Eyes: EOMI; sclera anicteric Neck: supple trachea midline Heart: RRR; no rub  Lungs: wheezing on exam; unlabored at rest; no crackles Abdomen: soft/nt/obese habitus; nor Extremities: bilateral pedal edema non-pitting  Skin: no rash on extremities exposed Neuro: alert and oriented x 3 provides a history; follows commands   Assessment/Plan:  # CKD stage V - Patient unsure of etiology - has HTN and a history of DM, now off of insulin  - no emergent indication for dialysis but is near the need  # Acute systolic CHF  - Cardiology is evaluating the patient - lasix IV x 1 and reassess   # CAD - Cath per cardiology discretion - their notes indicate possible cath on Monday, 8/3.  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

## 2019-04-21 NOTE — Progress Notes (Signed)
PROGRESS NOTE    [name]  RKY:[id number] DOB: [birth year] DOA: 04/19/2019 PCP: No primary care provider on file.  Brief Narrative:  [name] is a 78 y.o. male with medical history significant of hypertension, prostate cancer, CKD stage V, coronary artery disease, type 2 diabetes not on treatment and gout, history of acute DVT currently on Coumadin presenting to the emergency room with gradually progressing shortness of breath for about 1 week.  According to the patient, for the last 1 to 2 weeks he has been progressively more short of breath, gradually worsening symptoms, occasional wheezing but no cough or fever, more so at nighttime without any chest pain.    Assessment & Plan:   Principal Problem:   Shortness of breath at rest Active Problems:   Coronary artery disease   Hypertension   Chronic kidney disease   Chest pain   Dyspnea on exertion; Improving.  Echocardiogram showed LVEF around 30% with diffuse left ventricular wall hypokinesis.  Stress test done today and results are pending.  Nasal cannula oxygen to keep sats greater than 90%.    End-stage renal disease Nephrology consulted and repeat labs ordered.     History of coronary artery disease with history of PCI to LAD in 2012 Continue with aspirin    Type 2 diabetes mellitus Get A1c .  CBG (last 3)  Recent Labs    04/21/19 0736  GLUCAP 88     Anemia of chronic disease Transfuse to keep hemoglobin greater than 7.  History of DVT Coumadin held for possible cath next week.  Continue to monitor.    Hypertension Better controlled today.    DVT prophylaxis: Coumadin code Status full code Family Communication: None at bedside Disposition Plan: Pending clinical improvement  Consultants:   Cardiology  Procedures: None Antimicrobials: None  Subjective: No chest pain , still very short of breath on 2 L of nasal cannula oxygen  Objective: Vitals:   04/21/19 0335 04/21/19 0426  04/21/19 0623 04/21/19 1324  BP:  (!) 131/92    Pulse:  75  (!) 107  Resp:  18  17  Temp:  98.1 F (36.7 C)    TempSrc:  Oral    SpO2: 94% 94%  97%  Weight:   98.7 kg   Height:        Intake/Output Summary (Last 24 hours) at 04/21/2019 1440 Last data filed at 04/21/2019 0900 Gross per 24 hour  Intake 360 ml  Output 1450 ml  Net -1090 ml   Filed Weights   04/19/19 1826 04/20/19 0700 04/21/19 0623  Weight: 99 kg 100.2 kg 98.7 kg    Examination:  General exam: alert and no distress .  Respiratory system: Diminished air entry at bases, scattered wheezing present Cardiovascular system: S1 & S2 heard, RRR.  Gastrointestinal system: Abdomen is nondistended, soft and nontender. Normal bowel sounds heard. Central nervous system: Alert and oriented. No focal neurological deficits. Extremities: Symmetric 5 x 5 power. Skin: No rashes, lesions or ulcers Psychiatry: . Mood & affect appropriate.     Data Reviewed: I have personally reviewed following labs and imaging studies  CBC: Recent Labs  Lab 04/19/19 1346 04/20/19 0446  WBC 7.2 8.3  HGB 8.8* 9.7*  HCT 27.0* 30.3*  MCV 93.1 94.1  PLT 146* 315   Basic Metabolic Panel: Recent Labs  Lab 04/19/19 1346 04/20/19 0446  NA 139 141  K 4.7 4.5  CL 111 112*  CO2 20* 18*  GLUCOSE 117* 83  BUN  52* 52*  CREATININE 5.73* 5.65*  CALCIUM 8.5* 8.8*   GFR: Estimated Creatinine Clearance: 12.7 mL/min (A) (by C-G formula based on SCr of 5.65 mg/dL (H)). Liver Function Tests: No results for input(s): AST, ALT, ALKPHOS, BILITOT, PROT, ALBUMIN in the last 168 hours. No results for input(s): LIPASE, AMYLASE in the last 168 hours. No results for input(s): AMMONIA in the last 168 hours. Coagulation Profile: Recent Labs  Lab 04/19/19 1408 04/20/19 0446 04/21/19 0720  INR 2.8* 2.9* 3.1*   Cardiac Enzymes: No results for input(s): CKTOTAL, CKMB, CKMBINDEX, TROPONINI in the last 168 hours. BNP (last 3 results) No results for  input(s): PROBNP in the last 8760 hours. HbA1C: No results for input(s): HGBA1C in the last 72 hours. CBG: Recent Labs  Lab 04/21/19 0736  GLUCAP 88   Lipid Profile: No results for input(s): CHOL, HDL, LDLCALC, TRIG, CHOLHDL, LDLDIRECT in the last 72 hours. Thyroid Function Tests: No results for input(s): TSH, T4TOTAL, FREET4, T3FREE, THYROIDAB in the last 72 hours. Anemia Panel: No results for input(s): VITAMINB12, FOLATE, FERRITIN, TIBC, IRON, RETICCTPCT in the last 72 hours. Sepsis Labs: No results for input(s): PROCALCITON, LATICACIDVEN in the last 168 hours.  Recent Results (from the past 240 hour(s))  SARS Coronavirus 2 (CEPHEID - Performed in [place] hospital lab), Hosp Order     Status: None   Collection Time: 04/19/19  2:08 PM   Specimen: Nasopharyngeal Swab  Result Value Ref Range Status   SARS Coronavirus 2 NEGATIVE NEGATIVE Final    Comment: (NOTE) If result is NEGATIVE SARS-CoV-2 target nucleic acids are NOT DETECTED. The SARS-CoV-2 RNA is generally detectable in upper and lower  respiratory specimens during the acute phase of infection. The lowest  concentration of SARS-CoV-2 viral copies this assay can detect is 250  copies / mL. A negative result does not preclude SARS-CoV-2 infection  and should not be used as the sole basis for treatment or other  patient management decisions.  A negative result may occur with  improper specimen collection / handling, submission of specimen other  than nasopharyngeal swab, presence of viral mutation(s) within the  areas targeted by this assay, and inadequate number of viral copies  (<250 copies / mL). A negative result must be combined with clinical  observations, patient history, and epidemiological information. If result is POSITIVE SARS-CoV-2 target nucleic acids are DETECTED. The SARS-CoV-2 RNA is generally detectable in upper and lower  respiratory specimens dur ing the acute phase of infection.  Positive  results  are indicative of active infection with SARS-CoV-2.  Clinical  correlation with patient history and other diagnostic information is  necessary to determine patient infection status.  Positive results do  not rule out bacterial infection or co-infection with other viruses. If result is PRESUMPTIVE POSTIVE SARS-CoV-2 nucleic acids MAY BE PRESENT.   A presumptive positive result was obtained on the submitted specimen  and confirmed on repeat testing.  While 2019 novel coronavirus  (SARS-CoV-2) nucleic acids may be present in the submitted sample  additional confirmatory testing may be necessary for epidemiological  and / or clinical management purposes  to differentiate between  SARS-CoV-2 and other Sarbecovirus currently known to infect humans.  If clinically indicated additional testing with an alternate test  methodology [phone]) is advised. The SARS-CoV-2 RNA is generally  detectable in upper and lower respiratory sp ecimens during the acute  phase of infection. The expected result is Negative. Fact Sheet for Patients:  StrictlyIdeas.no Fact Sheet for Healthcare Providers:  BankingDealers.co.za This test is not yet approved or cleared by the [place] and has been authorized for detection and/or diagnosis of SARS-CoV-2 by FDA under an Emergency Use Authorization (EUA).  This EUA will remain in effect (meaning this test can be used) for the duration of the COVID-19 declaration under Section 564(b)(1) of the Act, 21 U.S.C. section 360bbb-3(b)(1), unless the authorization is terminated or revoked sooner. Performed at [facility], [place] [address] [place], [place] [address]          Radiology Studies: Dg Chest 2 View  Result Date: 04/19/2019 CLINICAL DATA:  Short of breath EXAM: CHEST - 2 VIEW COMPARISON:  03/15/2019 FINDINGS: Cardiac enlargement without heart failure or edema. No infiltrate or effusion.  Lungs remain hyperinflated and unchanged from the prior study. IMPRESSION: Cardiac enlargement without acute abnormality. Electronically Signed   By: Franchot Gallo M.D.   On: 04/19/2019 15:43        Scheduled Meds: . allopurinol  100 mg Oral BID  . amLODipine  5 mg Oral QHS  . aspirin EC  81 mg Oral Daily  . carvedilol  3.125 mg Oral BID WC  . cholecalciferol  1,000 Units Oral Daily  . docusate sodium  100 mg Oral Daily  . gabapentin  100 mg Oral QHS  . ipratropium-albuterol  3 mL Nebulization TID  . isosorbide-hydrALAZINE  1 tablet Oral TID  . mouth rinse  15 mL Mouth Rinse BID  . pantoprazole  40 mg Oral Daily  . terazosin  5 mg Oral q morning - 10a  . Warfarin - Pharmacist Dosing Inpatient   Does not apply q1800   Continuous Infusions:   LOS: 0 days    Time spent: 30 minutes.     Hosie Poisson, MD [facility] Pager [phone]   If 7PM-7AM, please contact night-coverage [email or web address] Password [email or web address] 04/21/2019, 2:40 PM

## 2019-04-21 NOTE — Progress Notes (Signed)
Progress Note  Patient Name: [name] Date of Encounter: 04/21/2019  Primary Cardiologist:  [facility], Dr Acie Fredrickson 2012  Subjective   Seen in Nuclear Med. Feels well, no dyspnea, no angina.  Inpatient Medications    Scheduled Meds: . allopurinol  100 mg Oral BID  . amLODipine  10 mg Oral QHS  . aspirin EC  81 mg Oral Daily  . cholecalciferol  1,000 Units Oral Daily  . docusate sodium  100 mg Oral Daily  . gabapentin  100 mg Oral QHS  . ipratropium-albuterol  3 mL Nebulization Q6H  . isosorbide-hydrALAZINE  1 tablet Oral TID  . mouth rinse  15 mL Mouth Rinse BID  . pantoprazole  40 mg Oral Daily  . terazosin  5 mg Oral q morning - 10a  . Warfarin - Pharmacist Dosing Inpatient   Does not apply q1800   Continuous Infusions:  PRN Meds: albuterol, nitroGLYCERIN, oxyCODONE-acetaminophen   Vital Signs    Vitals:   04/20/19 2158 04/21/19 0335 04/21/19 0426 04/21/19 0623  BP:   (!) 131/92   Pulse:   75   Resp:   18   Temp:   98.1 F (36.7 C)   TempSrc:   Oral   SpO2: 94% 94% 94%   Weight:    98.7 kg  Height:        Intake/Output Summary (Last 24 hours) at 04/21/2019 0817 Last data filed at 04/21/2019 0426 Gross per 24 hour  Intake 710 ml  Output 1675 ml  Net -965 ml   Filed Weights   04/19/19 1826 04/20/19 0700 04/21/19 8299  Weight: 99 kg 100.2 kg 98.7 kg   Last Weight  Most recent update: 04/21/2019  6:24 AM   Weight  98.7 kg (217 lb 8 oz)           Weight change: -0.343 kg   Telemetry    SR, PVCs - Personally Reviewed  ECG    None today - Personally Reviewed  Physical Exam   General: Well developed, well nourished, male appearing in no acute distress. Head: Normocephalic, atraumatic.  Neck: Supple without bruits, JVD 7-8 cm. Lungs:  Resp regular and unlabored, CTA. Heart: RRR, S1, S2, no S3, S4, or murmur; no rub. Abdomen: Soft, non-tender, non-distended with normoactive bowel sounds. No hepatomegaly. No rebound/guarding. No  obvious abdominal masses. Extremities: No clubbing, cyanosis, trivial edema. Distal pedal pulses are 2+ bilaterally. Neuro: Alert and oriented X 3. Moves all extremities spontaneously. Psych: Normal affect.  Labs    Hematology Recent Labs  Lab 04/19/19 1346 04/20/19 0446  WBC 7.2 8.3  RBC 2.90* 3.22*  HGB 8.8* 9.7*  HCT 27.0* 30.3*  MCV 93.1 94.1  MCH 30.3 30.1  MCHC 32.6 32.0  RDW 15.5 15.3  PLT 146* 162    Chemistry Recent Labs  Lab 04/19/19 1346 04/20/19 0446  NA 139 141  K 4.7 4.5  CL 111 112*  CO2 20* 18*  GLUCOSE 117* 83  BUN 52* 52*  CREATININE 5.73* 5.65*  CALCIUM 8.5* 8.8*  GFRNONAA 9* 9*  GFRAA 10* 10*  ANIONGAP 8 11     High Sensitivity Troponin:   Recent Labs  Lab 04/19/19 1346 04/19/19 1546 04/19/19 2314  TROPONINIHS 34* 31* 35*     BNPNo results for input(s): BNP, PROBNP in the last 168 hours.   DDimer  Recent Labs  Lab 04/19/19 1408  DDIMER 0.81*    Lab Results  Component Value Date   INR 3.1 (  H) 04/21/2019   INR 2.9 (H) 04/20/2019   INR 2.8 (H) 04/19/2019     Radiology    Dg Chest 2 View  Result Date: 04/19/2019 CLINICAL DATA:  Short of breath EXAM: CHEST - 2 VIEW COMPARISON:  03/15/2019 FINDINGS: Cardiac enlargement without heart failure or edema. No infiltrate or effusion. Lungs remain hyperinflated and unchanged from the prior study. IMPRESSION: Cardiac enlargement without acute abnormality. Electronically Signed   By: Franchot Gallo M.D.   On: 04/19/2019 15:43     Cardiac Studies   ECHO:   1. The left ventricle has moderate-severely reduced systolic function, with an ejection fraction of 30-35%. The cavity size was mildly dilated. There is moderately increased left ventricular wall thickness. Left ventricular diastolic Doppler parameters  are indeterminate. There is incoordinate septal motion. Left ventricular diffuse hypokinesis.  2. Mildly thickened tricuspid valve leaflets.  3. The mitral valve is abnormal. Moderate  thickening of the mitral valve leaflet.  4. Moderately sized vegetation on the tricuspid valve.  5. The tricuspid valve is abnormal.  6. The aortic valve is tricuspid. No stenosis of the aortic valve.  7. The aorta is normal in size and structure.  8. The inferior vena cava was normal in size with <50% respiratory variability.  Patient Profile     78 y.o. male w/ hx prostate cancer on Lupron, hypertension, diabetes type 2, CKD stage V, anemia of chronic disease, GERD, CAD s/p DES to LAD 2012, LBBB, obesity, tobacco abuse and DVT 05/2018 now on Coumadin, was admitted 07/27 with SOB, CHF. Recent echo w/ "valve problem", may need dialysis in the future.  Assessment & Plan    1. CHF - Acute systolic CHF, unclear cause. Known CAD, but no angina and no new ECG abnormalities. - evaluate for ischemia today  2. Possible tricuspid valve vegetation - Dr Sallyanne Kuster has reviewed the images and feels this is artifact - no further eval, ok to eat after stress test  3. LVD - EF normal 2012, no other results available till now - cardiomyopathy of unclear cause - MV today to eval for ischemia - has been started on hydralazine/nitrates - feel BP will tolerate BB, add Coreg 3.125 mg bid  4. HTN - amlodipine not best rx with decreased EF - will decrease to 5 mg today and hopefully can d/c tomorrow.  5. CKD V - does not yet have fistula, this is coming - feel cath would put him on HD, avoid dye if at all possible - per IM, if cath needed (MV high-risk), would involve Nephrology  6. Chronic anticoag - on coumadin, INR slightly supra therapeutic today - got coumadin since admission, including last pm - DVT was 10/2017  Otherwise, per IM Principal Problem:   Shortness of breath at rest Active Problems:   Coronary artery disease   Hypertension   Chronic kidney disease   Chest pain    Signed, Rosaria Ferries , PA-C 8:17 AM 04/21/2019 Pager: [phone]  I have seen and examined the  patient along with Rosaria Ferries , PA-C.  I have reviewed the chart, notes and new data.  I agree with PA's note.  Key new complaints: no angina and no dyspnea today. Net diuresis 2.3 L since admission. Weight down 3.4 lb. Key examination changes: JVD still noticeable. Key new findings / data: creat essentially unchanged at 5.6. K 4.5. INR 2.9. I have carefully reviewed the transthoracic echo images and I do not think that there is a tricuspid valve vegetation. I  am fairly confident this is just an artifact. Note absence of fever, leukocytosis or any other symptoms or risk factors for tricuspid valve endocarditis. No plan for TEE at this point.  PLAN: Continue transition from amlodipine to Bidil. If there is extensive reversible ischemia on nuclear stress testing will require coronary angiography, but this will likely lead to further renal failure, maybe even oligoanuria and will need to be prepared for dialysis. Will also need to stop warfarin, so will probably not be ready for cardiac cath until next Monday. If there is no reversible ischemia on the nuclear test, continue medical management, at least until he is on HD.  Sanda Klein, MD, [place] [phone] 04/21/2019, 11:46 AM

## 2019-04-21 NOTE — Plan of Care (Signed)
  Problem: Clinical Measurements: Goal: Respiratory complications will improve Outcome: Progressing   

## 2019-04-21 NOTE — Progress Notes (Signed)
   [name] presented for a nuclear stress test today.  No immediate complications.  Stress imaging is pending at this time.  Preliminary EKG findings may be listed in the chart, but the stress test result will not be finalized until perfusion imaging is complete.   Kathyrn Drown, NP-C 04/21/2019, 10:42 AM

## 2019-04-22 ENCOUNTER — Ambulatory Visit (HOSPITAL_COMMUNITY): Payer: Non-veteran care

## 2019-04-22 LAB — FERRITIN: Ferritin: 90 ng/mL (ref 24–336)

## 2019-04-22 LAB — BASIC METABOLIC PANEL
Anion gap: 10 (ref 5–15)
BUN: 59 mg/dL — ABNORMAL HIGH (ref 8–23)
CO2: 20 mmol/L — ABNORMAL LOW (ref 22–32)
Calcium: 8.9 mg/dL (ref 8.9–10.3)
Chloride: 108 mmol/L (ref 98–111)
Creatinine, Ser: 5.56 mg/dL — ABNORMAL HIGH (ref 0.61–1.24)
GFR calc Af Amer: 11 mL/min — ABNORMAL LOW (ref >60)
GFR calc non Af Amer: 9 mL/min — ABNORMAL LOW (ref >60)
Glucose, Bld: 92 mg/dL (ref 70–99)
Potassium: 4.5 mmol/L (ref 3.5–5.1)
Sodium: 138 mmol/L (ref 135–145)

## 2019-04-22 LAB — HEMOGLOBIN A1C
Hgb A1c MFr Bld: 5.6 % (ref 4.8–5.6)
Mean Plasma Glucose: 114.02 mg/dL

## 2019-04-22 LAB — GLUCOSE, CAPILLARY
Glucose-Capillary: 79 mg/dL (ref 70–99)
Glucose-Capillary: 114 mg/dL — ABNORMAL HIGH (ref 70–99)
Glucose-Capillary: 92 mg/dL (ref 70–99)
Glucose-Capillary: 100 mg/dL — ABNORMAL HIGH (ref 70–99)

## 2019-04-22 LAB — PROTIME-INR
INR: 2.2 — ABNORMAL HIGH (ref 0.8–1.2)
Prothrombin Time: 24.1 seconds — ABNORMAL HIGH (ref 11.4–15.2)

## 2019-04-22 LAB — IRON AND TIBC
Iron: 49 ug/dL (ref 45–182)
Saturation Ratios: 24 % (ref 17.9–39.5)
TIBC: 207 ug/dL — ABNORMAL LOW (ref 250–450)
UIBC: 158 ug/dL

## 2019-04-22 LAB — PHOSPHORUS: Phosphorus: 4.8 mg/dL — ABNORMAL HIGH (ref 2.5–4.6)

## 2019-04-22 MED ORDER — SODIUM CHLORIDE 0.9 % IV SOLN
INTRAVENOUS | Status: DC
  Administered 2019-04-23: 07:00:00 via INTRAVENOUS

## 2019-04-22 MED ORDER — FUROSEMIDE 10 MG/ML IJ SOLN
80.0000 mg | Freq: Once | INTRAMUSCULAR | Status: AC
  Administered 2019-04-22: 80 mg via INTRAVENOUS
  Filled 2019-04-22: qty 8

## 2019-04-22 MED ORDER — ASPIRIN 81 MG PO CHEW
81.0000 mg | CHEWABLE_TABLET | ORAL | Status: AC
  Administered 2019-04-23: 81 mg via ORAL
  Filled 2019-04-22: qty 1

## 2019-04-22 MED ORDER — SODIUM CHLORIDE 0.9 % IV SOLN: 250.0000 mL | INTRAVENOUS | Status: DC | PRN

## 2019-04-22 MED ORDER — ATORVASTATIN CALCIUM 40 MG PO TABS
40.0000 mg | ORAL_TABLET | Freq: Every day | ORAL | Status: DC
  Administered 2019-04-22 – 2019-04-25 (×4): 40 mg via ORAL
  Filled 2019-04-22 (×4): qty 1

## 2019-04-22 MED ORDER — SODIUM CHLORIDE 0.9% FLUSH: 3.0000 mL | INTRAVENOUS | Status: DC | PRN

## 2019-04-22 MED ORDER — SODIUM CHLORIDE 0.9% FLUSH
3.0000 mL | Freq: Two times a day (BID) | INTRAVENOUS | Status: DC
  Administered 2019-04-22 (×2): 3 mL via INTRAVENOUS

## 2019-04-22 MED ORDER — CARVEDILOL 6.25 MG PO TABS
6.2500 mg | ORAL_TABLET | Freq: Two times a day (BID) | ORAL | Status: DC
  Administered 2019-04-22 – 2019-04-28 (×12): 6.25 mg via ORAL
  Filled 2019-04-22 (×11): qty 1

## 2019-04-22 MED ORDER — IPRATROPIUM-ALBUTEROL 0.5-2.5 (3) MG/3ML IN SOLN
3.0000 mL | Freq: Two times a day (BID) | RESPIRATORY_TRACT | Status: DC
  Administered 2019-04-22 – 2019-04-24 (×4): 3 mL via RESPIRATORY_TRACT
  Filled 2019-04-22 (×5): qty 3

## 2019-04-22 NOTE — Progress Notes (Signed)
[facility] Progress Note  Name: [name] MRN: [id number] DOB: 08/02/1941  Chief Complaint:  Shortness of breath   Subjective:  He had high risk nuclear stress test with extensive LAD ischemia and reduced LVEF.  Spoke with cardiology and they plan to cath the patient on 7/31 if INR is ok.  He will be transferring to [facility] for the cath.  Spoke with primary team.  Patient had 1.7 liters UOP over 7/29.   Review of systems:  Shortness of breath is better; out of breath with exertion but a little better Denies n/v Denies chest pain Appetite reduced  -------------------- Background on consult:  [name] is a 78 y.o. 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  He has also noticed swelling in his feet.  Cardiology is seeing him for acute systolic CHF with EF of 96-75%.  He states prior heart stent placement in 2012.  They are considering a cath and they note that the dye load could likely ultimately put the patient on dialysis.  Creatinine trends as below: [facility].  The patient does not have a fistula in place.  He states that he has had vein mapping for a fistula but hasn't had a fistula placed.  He states that he was told that his veins may be too small for a fistula.  He thinks his recent kidney function is "10%".  He last saw nephrology last year in December 2019 and last had labs in December.  Dr. Gae Dry ?   He is not sure of the cause of his CKD but does have HTN and DM; he is now off of insulin as he passed out with hypoglycemia twice.  He states he does have changes to his eyes from diabetes.  He is not on lasix at home.  Breathing has gotten better which he attributes to inhalers but he still feels swollen.  He provides the number of [phone]  for the [place].  not normally on iron or ESA.  "I've been talking about dialysis for a year now".     Intake/Output Summary (Last 24 hours) at 04/22/2019 1530 Last data filed at 04/22/2019 1043 Gross per 24 hour  Intake 1080 ml  Output 2725 ml  Net -1645 ml    Vitals:  Vitals:   04/21/19 2147 04/22/19 0425 04/22/19 0603 04/22/19 0913  BP: (!) 144/89 126/74    Pulse: 75 70    Resp: 18 18    Temp: 98.6 F (37 C) 98 F (36.7 C)    TempSrc: Oral Oral    SpO2: 99%   98%  Weight:   98 kg   Height:         Physical Exam:  General adult male in bed in no acute distress HEENT normocephalic atraumatic extraocular movements intact sclera anicteric Neck supple trachea midline Lungs crackles bilateral bases; unlabored at rest  Heart RRR; no rub Abdomen soft nontender nondistended Extremities 1+ edema lower extremities Psych normal mood and affect  Medications reviewed   Labs:  BMP Latest Ref Rng & Units 04/22/2019 04/21/2019 04/20/2019  Glucose 70 - 99 mg/dL 92 152(H) 83  BUN 8 - 23 mg/dL 59(H) 58(H) 52(H)  Creatinine 0.61 - 1.24 mg/dL 5.56(H) 5.54(H) 5.65(H)  Sodium 135 - 145 mmol/L 138 137  141  Potassium 3.5 - 5.1 mmol/L 4.5 4.8 4.5  Chloride 98 - 111 mmol/L 108 109 112(H)  CO2 22 - 32 mmol/L 20(L) 17(L) 18(L)  Calcium 8.9 - 10.3 mg/dL 8.9 8.6(L) 8.8(L)     Assessment/Plan:   # CKD stage V - Patient unsure of etiology - has HTN and a history of DM, now off of insulin  - no emergent indication for dialysis but is near the need and anticipating heart cath  # Acute systolic CHF  - Cardiology is evaluating the patient and planning for cath  - Lasix 80 mg IV once now   # CAD - high risk nuclear stress test - cath planned tomorrow if INR acceptable  # HTN with CKD - Acceptable control  # Metabolic acidosis  - 2/2 CKD  - supplemental bicarbonate 650 mg daily   # Anemia 2/2 CKD  - Secondary in part to CKD likely  - normocytic without overt iron deficiency - reassess  IV iron tomorrow  # 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

## 2019-04-22 NOTE — Progress Notes (Signed)
PROGRESS NOTE    Wolfe Camarena  UVO:[id number] DOB: [birth year] DOA: 04/19/2019 PCP: No primary care provider on file.  Brief Narrative:  Shadrack Brummitt is a 78 y.o. male with medical history significant of hypertension, prostate cancer, CKD stage V, coronary artery disease, type 2 diabetes not on treatment and gout, history of acute DVT currently on Coumadin presenting to the emergency room with gradually progressing shortness of breath for about 1 week.  According to the patient, for the last 1 to 2 weeks he has been progressively more short of breath, gradually worsening symptoms, occasional wheezing but no cough or fever, more so at nighttime without any chest pain.    Assessment & Plan:   Principal Problem:   Shortness of breath at rest Active Problems:   Coronary artery disease   Hypertension   Chronic kidney disease   Chest pain   Dyspnea on exertion; Improving. Off oxygen today. Cardiology consulted and appreciate their recommendations.  Echocardiogram showed LVEF around 30% with diffuse left ventricular wall hypokinesis.   Stress test done today , shows ischemia. Plan for cardiac cath when INR is less than 1.7 Nasal cannula oxygen to keep sats greater than 90%.    End-stage renal disease Nephrology consulted and repeat labs ordered.     History of coronary artery disease with history of PCI to LAD in 2012 Continue with aspirin    Type 2 diabetes mellitus Get A1c is 5.6  Diet controlled.  CBG (last 3)  Recent Labs    04/22/19 0734 04/22/19 1101 04/22/19 1650  GLUCAP 79 114* 92     Anemia of chronic disease Transfuse to keep hemoglobin greater than 7.  History of DVT Coumadin held for possible cath next week.  Continue to monitor.    Hypertension Better controlled today.   Non sustained VT:  Get Magnesium levels.   osa On CPAP at home.    DVT prophylaxis:  scd's code Status full code Family Communication: None at bedside Disposition Plan:  Pending clinical improvement  Consultants:   Cardiology  Nephrology.   Procedures: Stress test. Echocardiogram.   Antimicrobials: None  Subjective: No chest pain or sob.  No nausea or vomiting.   Objective: Vitals:   04/22/19 0425 04/22/19 0603 04/22/19 0913 04/22/19 1653  BP: 126/74   (!) 141/96  Pulse: 70   70  Resp: 18   20  Temp: 98 F (36.7 C)   98.3 F (36.8 C)  TempSrc: Oral   Oral  SpO2:   98% 95%  Weight:  98 kg    Height:        Intake/Output Summary (Last 24 hours) at 04/22/2019 1826 Last data filed at 04/22/2019 1758 Gross per 24 hour  Intake 1440 ml  Output 2800 ml  Net -1360 ml   Filed Weights   04/20/19 0700 04/21/19 0623 04/22/19 0603  Weight: 100.2 kg 98.7 kg 98 kg    Examination:  General exam: alert and no distress .  Respiratory system: air entry fair no wheezing heard today.  Cardiovascular system: S1 & S2 heard, RRR.  Gastrointestinal system: Abdomen is nondistended, soft and nontender. Normal bowel sounds heard. Central nervous system: Alert and oriented. Non focal. Extremities: Symmetric 5 x 5 power. Skin: No rashes, lesions or ulcers Psychiatry: . Mood & affect appropriate.     Data Reviewed: I have personally reviewed following labs and imaging studies  CBC: Recent Labs  Lab 04/19/19 1346 04/20/19 0446  WBC 7.2 8.3  HGB 8.8* 9.7*  HCT 27.0* 30.3*  MCV 93.1 94.1  PLT 146* 462   Basic Metabolic Panel: Recent Labs  Lab 04/19/19 1346 04/20/19 0446 04/21/19 1419 04/22/19 0615  NA 139 141 137 138  K 4.7 4.5 4.8 4.5  CL 111 112* 109 108  CO2 20* 18* 17* 20*  GLUCOSE 117* 83 152* 92  BUN 52* 52* 58* 59*  CREATININE 5.73* 5.65* 5.54* 5.56*  CALCIUM 8.5* 8.8* 8.6* 8.9  PHOS  --   --   --  4.8*   GFR: Estimated Creatinine Clearance: 12.8 mL/min (A) (by C-G formula based on SCr of 5.56 mg/dL (H)). Liver Function Tests: No results for input(s): AST, ALT, ALKPHOS, BILITOT, PROT, ALBUMIN in the last 168 hours. No  results for input(s): LIPASE, AMYLASE in the last 168 hours. No results for input(s): AMMONIA in the last 168 hours. Coagulation Profile: Recent Labs  Lab 04/19/19 1408 04/20/19 0446 04/21/19 0720 04/22/19 0615  INR 2.8* 2.9* 3.1* 2.2*   Cardiac Enzymes: No results for input(s): CKTOTAL, CKMB, CKMBINDEX, TROPONINI in the last 168 hours. BNP (last 3 results) No results for input(s): PROBNP in the last 8760 hours. HbA1C: Recent Labs    04/22/19 0615  HGBA1C 5.6   CBG: Recent Labs  Lab 04/21/19 1624 04/21/19 2144 04/22/19 0734 04/22/19 1101 04/22/19 1650  GLUCAP 118* 92 79 114* 92   Lipid Profile: No results for input(s): CHOL, HDL, LDLCALC, TRIG, CHOLHDL, LDLDIRECT in the last 72 hours. Thyroid Function Tests: No results for input(s): TSH, T4TOTAL, FREET4, T3FREE, THYROIDAB in the last 72 hours. Anemia Panel: Recent Labs    04/22/19 0615  FERRITIN 90  TIBC 207*  IRON 49   Sepsis Labs: No results for input(s): PROCALCITON, LATICACIDVEN in the last 168 hours.  Recent Results (from the past 240 hour(s))  SARS Coronavirus 2 (CEPHEID - Performed in [place] hospital lab), Hosp Order     Status: None   Collection Time: 04/19/19  2:08 PM   Specimen: Nasopharyngeal Swab  Result Value Ref Range Status   SARS Coronavirus 2 NEGATIVE NEGATIVE Final    Comment: (NOTE) If result is NEGATIVE SARS-CoV-2 target nucleic acids are NOT DETECTED. The SARS-CoV-2 RNA is generally detectable in upper and lower  respiratory specimens during the acute phase of infection. The lowest  concentration of SARS-CoV-2 viral copies this assay can detect is 250  copies / mL. A negative result does not preclude SARS-CoV-2 infection  and should not be used as the sole basis for treatment or other  patient management decisions.  A negative result may occur with  improper specimen collection / handling, submission of specimen other  than nasopharyngeal swab, presence of viral mutation(s)  within the  areas targeted by this assay, and inadequate number of viral copies  (<250 copies / mL). A negative result must be combined with clinical  observations, patient history, and epidemiological information. If result is POSITIVE SARS-CoV-2 target nucleic acids are DETECTED. The SARS-CoV-2 RNA is generally detectable in upper and lower  respiratory specimens dur ing the acute phase of infection.  Positive  results are indicative of active infection with SARS-CoV-2.  Clinical  correlation with patient history and other diagnostic information is  necessary to determine patient infection status.  Positive results do  not rule out bacterial infection or co-infection with other viruses. If result is PRESUMPTIVE POSTIVE SARS-CoV-2 nucleic acids MAY BE PRESENT.   A presumptive positive result was obtained on the submitted specimen  and confirmed on repeat testing.  While 2019 novel coronavirus  (SARS-CoV-2) nucleic acids may be present in the submitted sample  additional confirmatory testing may be necessary for epidemiological  and / or clinical management purposes  to differentiate between  SARS-CoV-2 and other Sarbecovirus currently known to infect humans.  If clinically indicated additional testing with an alternate test  methodology [phone]) is advised. The SARS-CoV-2 RNA is generally  detectable in upper and lower respiratory sp ecimens during the acute  phase of infection. The expected result is Negative. Fact Sheet for Patients:  StrictlyIdeas.no Fact Sheet for Healthcare Providers: BankingDealers.co.za This test is not yet approved or cleared by the [place] FDA and has been authorized for detection and/or diagnosis of SARS-CoV-2 by FDA under an Emergency Use Authorization (EUA).  This EUA will remain in effect (meaning this test can be used) for the duration of the COVID-19 declaration under Section 564(b)(1) of the Act,  21 U.S.C. section 360bbb-3(b)(1), unless the authorization is terminated or revoked sooner. Performed at [facility], [place] [address] [place], [place] [address]          Radiology Studies: Nm Myocar Multi W/spect W/wall Motion / Ef  Result Date: 04/21/2019  There was no ST segment deviation noted during stress.  No T wave inversion was noted during stress.  Findings consistent with ischemia.  This is a high risk study.  The left ventricular ejection fraction is moderately decreased (30-44%).  Moderate sized area of anterolateral wall ischemia at mid and basal level with apical thinning Diffuse hypokinesis worse in the apex EF 31%        Scheduled Meds: . allopurinol  100 mg Oral BID  . [START ON 04/23/2019] aspirin  81 mg Oral Pre-Cath  . aspirin EC  81 mg Oral Daily  . atorvastatin  40 mg Oral q1800  . carvedilol  6.25 mg Oral BID WC  . cholecalciferol  1,000 Units Oral Daily  . docusate sodium  100 mg Oral Daily  . gabapentin  100 mg Oral QHS  . insulin aspart  0-5 Units Subcutaneous QHS  . insulin aspart  0-9 Units Subcutaneous TID WC  . ipratropium-albuterol  3 mL Nebulization BID  . isosorbide-hydrALAZINE  1 tablet Oral TID  . mouth rinse  15 mL Mouth Rinse BID  . pantoprazole  40 mg Oral Daily  . sodium bicarbonate  650 mg Oral Daily  . sodium chloride flush  3 mL Intravenous Q12H  . terazosin  5 mg Oral q morning - 10a   Continuous Infusions: . sodium chloride    . [START ON 04/23/2019] sodium chloride       LOS: 1 day    Time spent: 30 minutes.     Hosie Poisson, MD [facility] Pager [phone]   If 7PM-7AM, please contact night-coverage [email or web address] Password [facility] 04/22/2019, 6:26 PM

## 2019-04-22 NOTE — Progress Notes (Addendum)
Progress Note  Patient Name: [name] Date of Encounter: 04/22/2019  Primary Cardiologist:  [facility], Dr Acie Fredrickson 2012  Subjective   No chest pain overnight, still wheezing a little but breathing ok  Inpatient Medications    Scheduled Meds: . allopurinol  100 mg Oral BID  . amLODipine  5 mg Oral QHS  . aspirin EC  81 mg Oral Daily  . carvedilol  3.125 mg Oral BID WC  . cholecalciferol  1,000 Units Oral Daily  . docusate sodium  100 mg Oral Daily  . gabapentin  100 mg Oral QHS  . insulin aspart  0-5 Units Subcutaneous QHS  . insulin aspart  0-9 Units Subcutaneous TID WC  . ipratropium-albuterol  3 mL Nebulization TID  . isosorbide-hydrALAZINE  1 tablet Oral TID  . mouth rinse  15 mL Mouth Rinse BID  . pantoprazole  40 mg Oral Daily  . sodium bicarbonate  650 mg Oral Daily  . terazosin  5 mg Oral q morning - 10a   Continuous Infusions:  PRN Meds: albuterol, nitroGLYCERIN, oxyCODONE-acetaminophen   Vital Signs    Vitals:   04/21/19 1958 04/21/19 2147 04/22/19 0425 04/22/19 0603  BP:  (!) 144/89 126/74   Pulse:  75 70   Resp:  18 18   Temp:  98.6 F (37 C) 98 F (36.7 C)   TempSrc:  Oral Oral   SpO2: 97% 99%    Weight:    98 kg  Height:        Intake/Output Summary (Last 24 hours) at 04/22/2019 0755 Last data filed at 04/22/2019 0736 Gross per 24 hour  Intake 480 ml  Output 1625 ml  Net -1145 ml   Filed Weights   04/20/19 0700 04/21/19 0623 04/22/19 0603  Weight: 100.2 kg 98.7 kg 98 kg   Last Weight  Most recent update: 04/22/2019  6:03 AM   Weight  98 kg (216 lb 1.6 oz)           Weight change: -0.635 kg   Telemetry    SR, PVCs, 8 bt run VT- Personally Reviewed  ECG    None today - Personally Reviewed  Physical Exam   General: Well developed, well nourished, male in no acute distress Head: Eyes PERRLA, No xanthomas.   Normocephalic and atraumatic Lungs: few rales, slight wheeze bilaterally to auscultation. Heart: HRRR S1 S2,  without RG. Soft SEM. Upper extrem Pulses are 2+ & equal. JVD 9 cm. LE pulses decreased but present, cap refill WNL Abdomen: Bowel sounds are present, abdomen soft and non-tender without masses or  hernias noted. Msk: Normal strength and tone for age. Extremities: No clubbing, cyanosis, trace-1+ pedal edema.    Skin:  No rashes or lesions noted. Neuro: Alert and oriented X 3. Psych:  Good affect, responds appropriately  Labs    Hematology Recent Labs  Lab 04/19/19 1346 04/20/19 0446  WBC 7.2 8.3  RBC 2.90* 3.22*  HGB 8.8* 9.7*  HCT 27.0* 30.3*  MCV 93.1 94.1  MCH 30.3 30.1  MCHC 32.6 32.0  RDW 15.5 15.3  PLT 146* 162    Chemistry Recent Labs  Lab 04/20/19 0446 04/21/19 1419 04/22/19 0615  NA 141 137 138  K 4.5 4.8 4.5  CL 112* 109 108  CO2 18* 17* 20*  GLUCOSE 83 152* 92  BUN 52* 58* 59*  CREATININE 5.65* 5.54* 5.56*  CALCIUM 8.8* 8.6* 8.9  GFRNONAA 9* 9* 9*  GFRAA 10* 11* 11*  ANIONGAP  11 11 10      High Sensitivity Troponin:   Recent Labs  Lab 04/19/19 1346 04/19/19 1546 04/19/19 2314  TROPONINIHS 34* 31* 35*     BNPNo results for input(s): BNP, PROBNP in the last 168 hours.   DDimer  Recent Labs  Lab 04/19/19 1408  DDIMER 0.81*    Lab Results  Component Value Date   INR 2.2 (H) 04/22/2019   INR 3.1 (H) 04/21/2019   INR 2.9 (H) 04/20/2019     Radiology    Dg Chest 2 View  Result Date: 04/19/2019 CLINICAL DATA:  Short of breath EXAM: CHEST - 2 VIEW COMPARISON:  03/15/2019 FINDINGS: Cardiac enlargement without heart failure or edema. No infiltrate or effusion. Lungs remain hyperinflated and unchanged from the prior study. IMPRESSION: Cardiac enlargement without acute abnormality. Electronically Signed   By: Franchot Gallo M.D.   On: 04/19/2019 15:43   Nm Myocar Multi W/spect W/wall Motion / Ef  Result Date: 04/21/2019  There was no ST segment deviation noted during stress.  No T wave inversion was noted during stress.  Findings consistent  with ischemia.  This is a high risk study.  The left ventricular ejection fraction is moderately decreased (30-44%).  Moderate sized area of anterolateral wall ischemia at mid and basal level with apical thinning Diffuse hypokinesis worse in the apex EF 31%     Cardiac Studies   ECHO:   1. The left ventricle has moderate-severely reduced systolic function, with an ejection fraction of 30-35%. The cavity size was mildly dilated. There is moderately increased left ventricular wall thickness. Left ventricular diastolic Doppler parameters  are indeterminate. There is incoordinate septal motion. Left ventricular diffuse hypokinesis.  2. Mildly thickened tricuspid valve leaflets.  3. The mitral valve is abnormal. Moderate thickening of the mitral valve leaflet.  4. Moderately sized vegetation on the tricuspid valve.  5. The tricuspid valve is abnormal.  6. The aortic valve is tricuspid. No stenosis of the aortic valve.  7. The aorta is normal in size and structure.  8. The inferior vena cava was normal in size with <50% respiratory variability.  Patient Profile     78 y.o. male w/ hx prostate cancer on Lupron, hypertension, diabetes type 2, CKD stage V, anemia of chronic disease, GERD, CAD s/p DES to LAD 2012, LBBB, obesity, tobacco abuse and DVT 05/2018 now on Coumadin, was admitted 07/27 with SOB, CHF. Recent echo w/ "valve problem", may need dialysis in the future.  Assessment & Plan    1. CHF - Likely ischemic w/ abnl MV, results above - diuretic mgt per Nephrology - continue med rx w/ Bidil and BB, increase Coreg today - no ACE/ARB/Entresto w/ poor renal function  2. Possible tricuspid valve vegetation - felt to be artifact - no fever or elevated WBCs - doubt will need further eval  3. CAD - MV significantly abnl - unfortunately, will need cath, increased risk of renal failure w/ this but no other way to treat definitively - 15" discussion w/ pt on risks and benefits of cath. He  understands that he is at increased risk of HD and that INR needs to come down to minimize bleeding risk - He wishes to proceed, will put on board for tomorrow and write orders  4. HTN - will d/c amlodipine and increase BB - continue Bidil  5. CKD V - being seen by Nephrology - explained that HD is inevitable, and that cath may speed this up - He accepts the  risk  6. Chronic anticoag - INR today was 2.1, recheck in am - had Vit K 2.5 mg po last pm - start heparin once INR < 2.0 - DVT was 10/2017  7. NSVT - brief, asymptomatic  - K+ 4.5 so doubt Mg low, but will check in am - follow on telemetry  Otherwise, per IM Principal Problem:   Shortness of breath at rest Active Problems:   Coronary artery disease   Hypertension   Chronic kidney disease   Chest pain   Signed, Rosaria Ferries , PA-C 7:55 AM 04/22/2019 Pager: [phone]   I have seen and examined the patient along with Rosaria Ferries, PA-C .  I have reviewed the chart, notes and new data.  I agree with PA/NP's note.  Key new complaints: no angina or dyspnea at rest Key examination changes: mild JVD Key new findings / data: INR 2.2, coming down relatively fast. Creat 5.6, unchanged, K 4.5. High risk nuclear stress test with extensive (albeit apparently mild) LAD ischemia and reduced LVEF.  PLAN: Once INR 1.8 or less, plan to bring to [facility] for cath. Even if no PCI is performed, would keep at [facility] due to high likelihood he will require urgent initiation of HD after contrast based procedure. Continue medical Rx with Bidil/beta blockers, ASA/statin. IV fluids TKO only pre-cath.  Sanda Klein, MD, [place] [phone] 04/22/2019, 1:24 PM

## 2019-04-22 NOTE — Plan of Care (Signed)
  Problem: Clinical Measurements: Goal: Respiratory complications will improve Outcome: Progressing   Problem: Clinical Measurements: Goal: Diagnostic test results will improve Outcome: Not Progressing   Problem: Nutrition: Goal: Adequate nutrition will be maintained Outcome: Adequate for Discharge   Problem: Pain Managment: Goal: General experience of comfort will improve Outcome: Completed/Met   Problem: Safety: Goal: Ability to remain free from injury will improve Outcome: Completed/Met

## 2019-04-22 NOTE — Progress Notes (Signed)
   04/22/19 1038  Vitals  Ectopy Ventricular tachycardia, non-sustained (8 beats Multi-Focal. Baxter Flattery RN notified.)  Provider Notification  Provider Name/Title Dr. Karleen Hampshire  Date Provider Notified 04/22/19  Time Provider Notified 1017  Notification Type Page  Notification Reason Other (Comment) (irregular rhythm)  Response No new orders   Pt asymptomatic at time of dysrhythmia.  MD notified. Andre Lefort

## 2019-04-22 NOTE — H&P (View-Only) (Signed)
Progress Note  Patient Name: [name] Date of Encounter: 04/22/2019  Primary Cardiologist:  [facility], Dr Acie Fredrickson 2012  Subjective   No chest pain overnight, still wheezing a little but breathing ok  Inpatient Medications    Scheduled Meds: . allopurinol  100 mg Oral BID  . amLODipine  5 mg Oral QHS  . aspirin EC  81 mg Oral Daily  . carvedilol  3.125 mg Oral BID WC  . cholecalciferol  1,000 Units Oral Daily  . docusate sodium  100 mg Oral Daily  . gabapentin  100 mg Oral QHS  . insulin aspart  0-5 Units Subcutaneous QHS  . insulin aspart  0-9 Units Subcutaneous TID WC  . ipratropium-albuterol  3 mL Nebulization TID  . isosorbide-hydrALAZINE  1 tablet Oral TID  . mouth rinse  15 mL Mouth Rinse BID  . pantoprazole  40 mg Oral Daily  . sodium bicarbonate  650 mg Oral Daily  . terazosin  5 mg Oral q morning - 10a   Continuous Infusions:  PRN Meds: albuterol, nitroGLYCERIN, oxyCODONE-acetaminophen   Vital Signs    Vitals:   04/21/19 1958 04/21/19 2147 04/22/19 0425 04/22/19 0603  BP:  (!) 144/89 126/74   Pulse:  75 70   Resp:  18 18   Temp:  98.6 F (37 C) 98 F (36.7 C)   TempSrc:  Oral Oral   SpO2: 97% 99%    Weight:    98 kg  Height:        Intake/Output Summary (Last 24 hours) at 04/22/2019 0755 Last data filed at 04/22/2019 0736 Gross per 24 hour  Intake 480 ml  Output 1625 ml  Net -1145 ml   Filed Weights   04/20/19 0700 04/21/19 0623 04/22/19 0603  Weight: 100.2 kg 98.7 kg 98 kg   Last Weight  Most recent update: 04/22/2019  6:03 AM   Weight  98 kg (216 lb 1.6 oz)           Weight change: -0.635 kg   Telemetry    SR, PVCs, 8 bt run VT- Personally Reviewed  ECG    None today - Personally Reviewed  Physical Exam   General: Well developed, well nourished, male in no acute distress Head: Eyes PERRLA, No xanthomas.   Normocephalic and atraumatic Lungs: few rales, slight wheeze bilaterally to auscultation. Heart: HRRR S1 S2,  without RG. Soft SEM. Upper extrem Pulses are 2+ & equal. JVD 9 cm. LE pulses decreased but present, cap refill WNL Abdomen: Bowel sounds are present, abdomen soft and non-tender without masses or  hernias noted. Msk: Normal strength and tone for age. Extremities: No clubbing, cyanosis, trace-1+ pedal edema.    Skin:  No rashes or lesions noted. Neuro: Alert and oriented X 3. Psych:  Good affect, responds appropriately  Labs    Hematology Recent Labs  Lab 04/19/19 1346 04/20/19 0446  WBC 7.2 8.3  RBC 2.90* 3.22*  HGB 8.8* 9.7*  HCT 27.0* 30.3*  MCV 93.1 94.1  MCH 30.3 30.1  MCHC 32.6 32.0  RDW 15.5 15.3  PLT 146* 162    Chemistry Recent Labs  Lab 04/20/19 0446 04/21/19 1419 04/22/19 0615  NA 141 137 138  K 4.5 4.8 4.5  CL 112* 109 108  CO2 18* 17* 20*  GLUCOSE 83 152* 92  BUN 52* 58* 59*  CREATININE 5.65* 5.54* 5.56*  CALCIUM 8.8* 8.6* 8.9  GFRNONAA 9* 9* 9*  GFRAA 10* 11* 11*  ANIONGAP  11 11 10      High Sensitivity Troponin:   Recent Labs  Lab 04/19/19 1346 04/19/19 1546 04/19/19 2314  TROPONINIHS 34* 31* 35*     BNPNo results for input(s): BNP, PROBNP in the last 168 hours.   DDimer  Recent Labs  Lab 04/19/19 1408  DDIMER 0.81*    Lab Results  Component Value Date   INR 2.2 (H) 04/22/2019   INR 3.1 (H) 04/21/2019   INR 2.9 (H) 04/20/2019     Radiology    Dg Chest 2 View  Result Date: 04/19/2019 CLINICAL DATA:  Short of breath EXAM: CHEST - 2 VIEW COMPARISON:  03/15/2019 FINDINGS: Cardiac enlargement without heart failure or edema. No infiltrate or effusion. Lungs remain hyperinflated and unchanged from the prior study. IMPRESSION: Cardiac enlargement without acute abnormality. Electronically Signed   By: Franchot Gallo M.D.   On: 04/19/2019 15:43   Nm Myocar Multi W/spect W/wall Motion / Ef  Result Date: 04/21/2019  There was no ST segment deviation noted during stress.  No T wave inversion was noted during stress.  Findings consistent  with ischemia.  This is a high risk study.  The left ventricular ejection fraction is moderately decreased (30-44%).  Moderate sized area of anterolateral wall ischemia at mid and basal level with apical thinning Diffuse hypokinesis worse in the apex EF 31%     Cardiac Studies   ECHO:   1. The left ventricle has moderate-severely reduced systolic function, with an ejection fraction of 30-35%. The cavity size was mildly dilated. There is moderately increased left ventricular wall thickness. Left ventricular diastolic Doppler parameters  are indeterminate. There is incoordinate septal motion. Left ventricular diffuse hypokinesis.  2. Mildly thickened tricuspid valve leaflets.  3. The mitral valve is abnormal. Moderate thickening of the mitral valve leaflet.  4. Moderately sized vegetation on the tricuspid valve.  5. The tricuspid valve is abnormal.  6. The aortic valve is tricuspid. No stenosis of the aortic valve.  7. The aorta is normal in size and structure.  8. The inferior vena cava was normal in size with <50% respiratory variability.  Patient Profile     78 y.o. male w/ hx prostate cancer on Lupron, hypertension, diabetes type 2, CKD stage V, anemia of chronic disease, GERD, CAD s/p DES to LAD 2012, LBBB, obesity, tobacco abuse and DVT 05/2018 now on Coumadin, was admitted 07/27 with SOB, CHF. Recent echo w/ "valve problem", may need dialysis in the future.  Assessment & Plan    1. CHF - Likely ischemic w/ abnl MV, results above - diuretic mgt per Nephrology - continue med rx w/ Bidil and BB, increase Coreg today - no ACE/ARB/Entresto w/ poor renal function  2. Possible tricuspid valve vegetation - felt to be artifact - no fever or elevated WBCs - doubt will need further eval  3. CAD - MV significantly abnl - unfortunately, will need cath, increased risk of renal failure w/ this but no other way to treat definitively - 15" discussion w/ pt on risks and benefits of cath. He  understands that he is at increased risk of HD and that INR needs to come down to minimize bleeding risk - He wishes to proceed, will put on board for tomorrow and write orders  4. HTN - will d/c amlodipine and increase BB - continue Bidil  5. CKD V - being seen by Nephrology - explained that HD is inevitable, and that cath may speed this up - He accepts the  risk  6. Chronic anticoag - INR today was 2.1, recheck in am - had Vit K 2.5 mg po last pm - start heparin once INR < 2.0 - DVT was 10/2017  7. NSVT - brief, asymptomatic  - K+ 4.5 so doubt Mg low, but will check in am - follow on telemetry  Otherwise, per IM Principal Problem:   Shortness of breath at rest Active Problems:   Coronary artery disease   Hypertension   Chronic kidney disease   Chest pain   Signed, Rosaria Ferries , PA-C 7:55 AM 04/22/2019 Pager: [phone]   I have seen and examined the patient along with Rosaria Ferries, PA-C .  I have reviewed the chart, notes and new data.  I agree with PA/NP's note.  Key new complaints: no angina or dyspnea at rest Key examination changes: mild JVD Key new findings / data: INR 2.2, coming down relatively fast. Creat 5.6, unchanged, K 4.5. High risk nuclear stress test with extensive (albeit apparently mild) LAD ischemia and reduced LVEF.  PLAN: Once INR 1.8 or less, plan to bring to [facility] for cath. Even if no PCI is performed, would keep at [facility] due to high likelihood he will require urgent initiation of HD after contrast based procedure. Continue medical Rx with Bidil/beta blockers, ASA/statin. IV fluids TKO only pre-cath.  Sanda Klein, MD, [place] [phone] 04/22/2019, 1:24 PM

## 2019-04-23 ENCOUNTER — Encounter (HOSPITAL_COMMUNITY): Payer: Self-pay | Admitting: Cardiovascular Disease

## 2019-04-23 ENCOUNTER — Encounter (HOSPITAL_COMMUNITY)
Admission: EM | Disposition: A | Discharge status: Home / Self Care | Payer: Self-pay | Source: Home / Self Care | Attending: Internal Medicine

## 2019-04-23 DIAGNOSIS — N185 Chronic kidney disease, stage 5: Secondary | ICD-10-CM

## 2019-04-23 DIAGNOSIS — I5041 Acute combined systolic (congestive) and diastolic (congestive) heart failure: Secondary | ICD-10-CM

## 2019-04-23 DIAGNOSIS — Z6838 Body mass index (BMI) 38.0-38.9, adult: Secondary | ICD-10-CM

## 2019-04-23 DIAGNOSIS — I4729 Other ventricular tachycardia: Secondary | ICD-10-CM

## 2019-04-23 DIAGNOSIS — I2510 Atherosclerotic heart disease of native coronary artery without angina pectoris: Secondary | ICD-10-CM

## 2019-04-23 DIAGNOSIS — I447 Left bundle-branch block, unspecified: Secondary | ICD-10-CM

## 2019-04-23 DIAGNOSIS — I472 Ventricular tachycardia: Secondary | ICD-10-CM

## 2019-04-23 HISTORY — PX: LEFT HEART CATH AND CORONARY ANGIOGRAPHY: CATH118249

## 2019-04-23 LAB — CBC
HCT: 26.4 % — ABNORMAL LOW (ref 39.0–52.0)
HCT: 25.6 % — ABNORMAL LOW (ref 39.0–52.0)
Hemoglobin: 8.5 g/dL — ABNORMAL LOW (ref 13.0–17.0)
Hemoglobin: 8.5 g/dL — ABNORMAL LOW (ref 13.0–17.0)
MCH: 30.2 pg (ref 26.0–34.0)
MCH: 30.1 pg (ref 26.0–34.0)
MCHC: 32.2 g/dL (ref 30.0–36.0)
MCHC: 33.2 g/dL (ref 30.0–36.0)
MCV: 94 fL (ref 80.0–100.0)
MCV: 90.8 fL (ref 80.0–100.0)
Platelets: 150 10*3/uL (ref 150–400)
Platelets: 131 10*3/uL — ABNORMAL LOW (ref 150–400)
RBC: 2.81 MIL/uL — ABNORMAL LOW (ref 4.22–5.81)
RBC: 2.82 MIL/uL — ABNORMAL LOW (ref 4.22–5.81)
RDW: 15.3 % (ref 11.5–15.5)
RDW: 14.9 % (ref 11.5–15.5)
WBC: 7.8 10*3/uL (ref 4.0–10.5)
WBC: 6.2 10*3/uL (ref 4.0–10.5)
nRBC: 0 % (ref 0.0–0.2)
nRBC: 0 % (ref 0.0–0.2)

## 2019-04-23 LAB — GLUCOSE, CAPILLARY
Glucose-Capillary: 79 mg/dL (ref 70–99)
Glucose-Capillary: 79 mg/dL (ref 70–99)
Glucose-Capillary: 166 mg/dL — ABNORMAL HIGH (ref 70–99)
Glucose-Capillary: 108 mg/dL — ABNORMAL HIGH (ref 70–99)

## 2019-04-23 LAB — CREATININE, SERUM
Creatinine, Ser: 6.31 mg/dL — ABNORMAL HIGH (ref 0.61–1.24)
GFR calc Af Amer: 9 mL/min — ABNORMAL LOW (ref >60)
GFR calc non Af Amer: 8 mL/min — ABNORMAL LOW (ref >60)

## 2019-04-23 LAB — BASIC METABOLIC PANEL
Anion gap: 11 (ref 5–15)
BUN: 70 mg/dL — ABNORMAL HIGH (ref 8–23)
CO2: 20 mmol/L — ABNORMAL LOW (ref 22–32)
Calcium: 8.9 mg/dL (ref 8.9–10.3)
Chloride: 105 mmol/L (ref 98–111)
Creatinine, Ser: 6.42 mg/dL — ABNORMAL HIGH (ref 0.61–1.24)
GFR calc Af Amer: 9 mL/min — ABNORMAL LOW (ref >60)
GFR calc non Af Amer: 8 mL/min — ABNORMAL LOW (ref >60)
Glucose, Bld: 81 mg/dL (ref 70–99)
Potassium: 4.6 mmol/L (ref 3.5–5.1)
Sodium: 136 mmol/L (ref 135–145)

## 2019-04-23 LAB — PROTIME-INR
INR: 1.4 — ABNORMAL HIGH (ref 0.8–1.2)
Prothrombin Time: 17.4 seconds — ABNORMAL HIGH (ref 11.4–15.2)

## 2019-04-23 LAB — MAGNESIUM: Magnesium: 1.6 mg/dL — ABNORMAL LOW (ref 1.7–2.4)

## 2019-04-23 LAB — PARATHYROID HORMONE, INTACT (NO CA): PTH: 114 pg/mL — ABNORMAL HIGH (ref 15–65)

## 2019-04-23 SURGERY — LEFT HEART CATH AND CORONARY ANGIOGRAPHY
Anesthesia: LOCAL

## 2019-04-23 MED ORDER — HEPARIN (PORCINE) IN NACL 1000-0.9 UT/500ML-% IV SOLN
INTRAVENOUS | Status: AC
  Filled 2019-04-23: qty 500

## 2019-04-23 MED ORDER — LABETALOL HCL 5 MG/ML IV SOLN: 10.0000 mg | INTRAVENOUS | Status: AC | PRN

## 2019-04-23 MED ORDER — HYDRALAZINE HCL 20 MG/ML IJ SOLN: 10.0000 mg | INTRAMUSCULAR | Status: AC | PRN

## 2019-04-23 MED ORDER — LIDOCAINE HCL (PF) 1 % IJ SOLN
INTRAMUSCULAR | Status: DC | PRN
  Administered 2019-04-23: 15 mL

## 2019-04-23 MED ORDER — HEPARIN (PORCINE) IN NACL 1000-0.9 UT/500ML-% IV SOLN
INTRAVENOUS | Status: AC
  Filled 2019-04-23: qty 1000

## 2019-04-23 MED ORDER — MIDAZOLAM HCL 2 MG/2ML IJ SOLN
INTRAMUSCULAR | Status: DC | PRN
  Administered 2019-04-23: 1 mg via INTRAVENOUS

## 2019-04-23 MED ORDER — SODIUM CHLORIDE 0.9% FLUSH
3.0000 mL | Freq: Two times a day (BID) | INTRAVENOUS | Status: DC
  Administered 2019-04-23 – 2019-04-27 (×7): 3 mL via INTRAVENOUS

## 2019-04-23 MED ORDER — FENTANYL CITRATE (PF) 100 MCG/2ML IJ SOLN
INTRAMUSCULAR | Status: DC | PRN
  Administered 2019-04-23: 25 ug via INTRAVENOUS

## 2019-04-23 MED ORDER — LIDOCAINE HCL (PF) 1 % IJ SOLN
INTRAMUSCULAR | Status: AC
  Filled 2019-04-23: qty 30

## 2019-04-23 MED ORDER — ACETAMINOPHEN 325 MG PO TABS: 650.0000 mg | ORAL_TABLET | ORAL | Status: DC | PRN

## 2019-04-23 MED ORDER — HEPARIN (PORCINE) IN NACL 1000-0.9 UT/500ML-% IV SOLN
INTRAVENOUS | Status: DC | PRN
  Administered 2019-04-23: 500 mL

## 2019-04-23 MED ORDER — SODIUM CHLORIDE 0.9 % IV SOLN
510.0000 mg | Freq: Once | INTRAVENOUS | Status: AC
  Administered 2019-04-23: 510 mg via INTRAVENOUS
  Filled 2019-04-23: qty 17

## 2019-04-23 MED ORDER — MAGNESIUM SULFATE 2 GM/50ML IV SOLN
2.0000 g | Freq: Once | INTRAVENOUS | Status: AC
  Administered 2019-04-23: 2 g via INTRAVENOUS
  Filled 2019-04-23: qty 50

## 2019-04-23 MED ORDER — IOHEXOL 350 MG/ML SOLN
INTRAVENOUS | Status: DC | PRN
  Administered 2019-04-23: 50 mL via INTRA_ARTERIAL

## 2019-04-23 MED ORDER — SODIUM CHLORIDE 0.9 % IV SOLN: INTRAVENOUS | Status: DC

## 2019-04-23 MED ORDER — SODIUM CHLORIDE 0.9% FLUSH: 3.0000 mL | INTRAVENOUS | Status: DC | PRN

## 2019-04-23 MED ORDER — MIDAZOLAM HCL 2 MG/2ML IJ SOLN
INTRAMUSCULAR | Status: AC
  Filled 2019-04-23: qty 2

## 2019-04-23 MED ORDER — ONDANSETRON HCL 4 MG/2ML IJ SOLN: 4.0000 mg | Freq: Four times a day (QID) | INTRAMUSCULAR | Status: DC | PRN

## 2019-04-23 MED ORDER — FENTANYL CITRATE (PF) 100 MCG/2ML IJ SOLN
INTRAMUSCULAR | Status: AC
  Filled 2019-04-23: qty 2

## 2019-04-23 MED ORDER — HEPARIN SODIUM (PORCINE) 5000 UNIT/ML IJ SOLN
5000.0000 [IU] | Freq: Three times a day (TID) | INTRAMUSCULAR | Status: DC
  Administered 2019-04-23 – 2019-04-27 (×8): 5000 [IU] via SUBCUTANEOUS
  Filled 2019-04-23 (×10): qty 1

## 2019-04-23 MED ORDER — HEPARIN (PORCINE) IN NACL 1000-0.9 UT/500ML-% IV SOLN
INTRAVENOUS | Status: DC | PRN
  Administered 2019-04-23 (×2): 500 mL

## 2019-04-23 MED ORDER — SODIUM CHLORIDE 0.9 % IV SOLN: 250.0000 mL | INTRAVENOUS | Status: DC | PRN

## 2019-04-23 SURGICAL SUPPLY — 8 items
CATH INFINITI 5FR MULTPACK ANG (CATHETERS) ×2 IMPLANT
CLOSURE MYNX CONTROL 5F (Vascular Products) ×2 IMPLANT
KIT HEART LEFT (KITS) ×2 IMPLANT
PACK CARDIAC CATHETERIZATION (CUSTOM PROCEDURE TRAY) ×2 IMPLANT
SHEATH PINNACLE 5F 10CM (SHEATH) ×2 IMPLANT
SHEATH PROBE COVER 6X72 (BAG) ×2 IMPLANT
TRANSDUCER W/STOPCOCK (MISCELLANEOUS) ×2 IMPLANT
WIRE EMERALD 3MM-J .035X150CM (WIRE) ×2 IMPLANT

## 2019-04-23 NOTE — Progress Notes (Signed)
Patient had 9 beats of VTACH. I paged Dr. Karleen Hampshire and made her aware. Per her, patient has had periods of 6 to 8 beats of Vtach and Dr. Karleen Hampshire relayed it to [place], [place] with Cardiology this am at [facility]. Orders for IV magnesium received as patients level was low this am. Will continue to monitor

## 2019-04-23 NOTE — Progress Notes (Signed)
[facility] called stating they are ready for patient at [facility] for cath procedure. Per cardiology, patient should stay at [facility] after procedure. Dr. Karleen Hampshire paged and awaiting transfer orders for pt. Pt informed of situation.

## 2019-04-23 NOTE — Progress Notes (Signed)
IV team has been unsuccessful with obtaining second IV site for patient to go to cath lab today. Pt does currently have a working IV site. This RN spoke with Barbaraann Rondo in the cath lab at [facility], and he stated it wasn't necessary to continue attempting as long as he has one working IV site.

## 2019-04-23 NOTE — Progress Notes (Signed)
[facility] Progress Note  Name: [name] MRN: [id number] DOB: 01-16-1941  Chief Complaint:  Shortness of breath   Subjective:  He was transferred to [facility] and the patient had heart cath earlier today.  He lives here in [place] and states that he had been planning to go to a dialysis unit through the [place].  He wants dialysis when needed.   Heat cath recommended medical therapy for CAD and CHF.  Cardiomyopathy called as out proportion to extent of CAD.  He has been seeing Dr. [name] Banker at the [facility].    We discussed that he had no emergent indication for HD but renal function continues to worsen.  He is deciding where he would like to do dialysis - at [place] in [place] or in [place].   Review of systems:  Shortness of breath is better Denies n/v Denies chest pain -------------------- Background on consult:  [name] is a 78 y.o. 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  He has also noticed swelling in his feet.  Cardiology is seeing him for acute systolic CHF with EF of 09-98%.  He states prior heart stent placement in 2012.  They are considering a cath and they note that the dye load could likely ultimately put the patient on dialysis.  Creatinine trends as below: [facility].  The patient does not have a fistula in place.  He states that he has had vein mapping for a fistula but hasn't had a fistula placed.  He states that he was told that his veins may be too small for a fistula.  He thinks his recent kidney function is "10%".  He last saw nephrology last year in December 2019 and last had labs in December.  Dr. Gae Dry ?   He is not sure of the cause of his CKD but does have HTN and DM; he is now off of insulin as he passed out with hypoglycemia  twice.  He states he does have changes to his eyes from diabetes.  He is not on lasix at home.  Breathing has gotten better which he attributes to inhalers but he still feels swollen.  He provides the number of [phone] for the [place].  not normally on iron or ESA.  "I've been talking about dialysis for a year now".     Intake/Output Summary (Last 24 hours) at 04/23/2019 1500 Last data filed at 04/23/2019 1300 Gross per 24 hour  Intake 378.92 ml  Output 1200 ml  Net -821.08 ml    Vitals:  Vitals:   04/23/19 1325 04/23/19 1340 04/23/19 1355 04/23/19 1426  BP: 128/70 (!) 149/79 (!) 159/85 138/80  Pulse: 64 65 66 63  Resp: 16 14 (!) 21 19  Temp:    98.6 F (37 C)  TempSrc:    Oral  SpO2: 98% 97% 98% 98%  Weight:      Height:         Physical Exam:  General adult male in bed in no acute distress  HEENT normocephalic atraumatic extraocular movements intact sclera anicteric Neck supple trachea midline Lungs crackles bilateral bases; unlabored at rest  Heart RRR; no rub Abdomen soft nontender nondistended Extremities trace edema lower extremities Psych normal mood and affect  Medications reviewed   Labs:  BMP  Latest Ref Rng & Units 04/23/2019 04/22/2019 04/21/2019  Glucose 70 - 99 mg/dL 81 92 152(H)  BUN 8 - 23 mg/dL 70(H) 59(H) 58(H)  Creatinine 0.61 - 1.24 mg/dL 6.42(H) 5.56(H) 5.54(H)  Sodium 135 - 145 mmol/L 136 138 137  Potassium 3.5 - 5.1 mmol/L 4.6 4.5 4.8  Chloride 98 - 111 mmol/L 105 108 109  CO2 22 - 32 mmol/L 20(L) 20(L) 17(L)  Calcium 8.9 - 10.3 mg/dL 8.9 8.9 8.6(L)     Assessment/Plan:   # CKD stage V - Patient unsure of etiology - has HTN and a history of DM, now off of insulin  - He has eaten today and no emergent indication for HD today  - Plan for initiating HD on 8/3 after tunneled catheter placement  - NPO after midnight tonight in the event of acute worsening - we discussed this was a precaution  - Patient is deciding where he would like to do dialysis  and will let us know   # Acute systolic CHF  - s/p heart cath today with medical therapy recommended  - Discontinue fluids  - Ok for lasix if needed for respiratory status   # CAD - s/p heart cath today with medical therapy recommended   # HTN with CKD - Acceptable control  # Metabolic acidosis  - 2/2 CKD  - supplemental bicarbonate 650 mg daily   # Anemia 2/2 CKD  - Secondary in part to CKD likely  - normocytic - CBC in AM  - Feraheme x 1    # 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

## 2019-04-23 NOTE — Progress Notes (Signed)
PROGRESS NOTE    [name]  DSK:[id number] DOB: [birth year] DOA: 04/19/2019 PCP: No primary care provider on file.  Brief Narrative:  [name] is a 78 y.o. male with medical history significant of hypertension, prostate cancer, CKD stage V, coronary artery disease, type 2 diabetes not on treatment and gout, history of acute DVT currently on Coumadin presenting to the emergency room with gradually progressing shortness of breath for about 1 week.  According to the patient, for the last 1 to 2 weeks he has been progressively more short of breath, gradually worsening symptoms, occasional wheezing but no cough or fever, more so at nighttime without any chest pain.    Assessment & Plan:   Principal Problem:   Shortness of breath at rest Active Problems:   Coronary artery disease   Hypertension   Chronic kidney disease   Chest pain   Dyspnea on exertion; Improving. Off oxygen. Cardiology consulted and appreciate their recommendations.  Echocardiogram showed LVEF around 30% with diffuse left ventricular wall hypokinesis.   Stress test done today , shows Moderate sized area of anterolateral wall ischemia at mid and basal level with apical thinning . Diffuse hypokinesis worse in the apex EF 31%  Cardiac cath today, at [facility].     End-stage renal disease Nephrology consulted. Plan for HD If his creatinine worsens post cath.    History of coronary artery disease with history of PCI to LAD in 2012 Continue with aspirin and lipitor.     Type 2 diabetes mellitus Get A1c is 5.6  Diet controlled.  CBG (last 3)  Recent Labs    04/22/19 1650 04/22/19 2154 04/23/19 0742  GLUCAP 92 100* 79     Anemia of chronic disease Transfuse to keep hemoglobin greater than 7.  History of DVT Coumadin for cath today. Continue to monitor.    Hypertension Better controlled today.   Non sustained VT:  Get Magnesium levels.   OSA On CPAP at home.    DVT prophylaxis:  scd's code  Status full code Family Communication: None at bedside Disposition Plan: Transfer to [facility] for cath.   Consultants:   Cardiology  Nephrology.   Procedures: Stress test. Echocardiogram.   Antimicrobials: None  Subjective: No chest pain or sob.  No nausea or vomiting.   Objective: Vitals:   04/23/19 0452 04/23/19 0551 04/23/19 0819 04/23/19 0911  BP: (!) 144/80   (!) 145/80  Pulse: 70   66  Resp: 20     Temp: 98.8 F (37.1 C)     TempSrc: Oral     SpO2: 93%  93% 94%  Weight:  97.1 kg    Height:        Intake/Output Summary (Last 24 hours) at 04/23/2019 0937 Last data filed at 04/23/2019 0917 Gross per 24 hour  Intake 600 ml  Output 1250 ml  Net -650 ml   Filed Weights   04/21/19 0623 04/22/19 0603 04/23/19 0551  Weight: 98.7 kg 98 kg 97.1 kg    Examination:  General exam: alert and no distress .  Respiratory system: air entry fair no wheezing heard today.  Cardiovascular system: S1 & S2 heard, RRR.  Gastrointestinal system: Abdomen is nondistended, soft and nontender. Normal bowel sounds heard. Central nervous system: Alert and oriented. Non focal. Extremities: Symmetric 5 x 5 power. Skin: No rashes, lesions or ulcers Psychiatry: . Mood & affect appropriate.     Data Reviewed: I have personally reviewed following labs and imaging studies  CBC: Recent Labs  Lab 04/19/19 1346 04/20/19 0446 04/23/19 0649  WBC 7.2 8.3 7.8  HGB 8.8* 9.7* 8.5*  HCT 27.0* 30.3* 26.4*  MCV 93.1 94.1 94.0  PLT 146* 162 195   Basic Metabolic Panel: Recent Labs  Lab 04/19/19 1346 04/20/19 0446 04/21/19 1419 04/22/19 0615 04/23/19 0649  NA 139 141 137 138 136  K 4.7 4.5 4.8 4.5 4.6  CL 111 112* 109 108 105  CO2 20* 18* 17* 20* 20*  GLUCOSE 117* 83 152* 92 81  BUN 52* 52* 58* 59* 70*  CREATININE 5.73* 5.65* 5.54* 5.56* 6.42*  CALCIUM 8.5* 8.8* 8.6* 8.9 8.9  PHOS  --   --   --  4.8*  --    GFR: Estimated Creatinine Clearance: 11.1 mL/min (A) (by C-G formula based  on SCr of 6.42 mg/dL (H)). Liver Function Tests: No results for input(s): AST, ALT, ALKPHOS, BILITOT, PROT, ALBUMIN in the last 168 hours. No results for input(s): LIPASE, AMYLASE in the last 168 hours. No results for input(s): AMMONIA in the last 168 hours. Coagulation Profile: Recent Labs  Lab 04/19/19 1408 04/20/19 0446 04/21/19 0720 04/22/19 0615 04/23/19 0649  INR 2.8* 2.9* 3.1* 2.2* 1.4*   Cardiac Enzymes: No results for input(s): CKTOTAL, CKMB, CKMBINDEX, TROPONINI in the last 168 hours. BNP (last 3 results) No results for input(s): PROBNP in the last 8760 hours. HbA1C: Recent Labs    04/22/19 0615  HGBA1C 5.6   CBG: Recent Labs  Lab 04/22/19 0734 04/22/19 1101 04/22/19 1650 04/22/19 2154 04/23/19 0742  GLUCAP 79 114* 92 100* 79   Lipid Profile: No results for input(s): CHOL, HDL, LDLCALC, TRIG, CHOLHDL, LDLDIRECT in the last 72 hours. Thyroid Function Tests: No results for input(s): TSH, T4TOTAL, FREET4, T3FREE, THYROIDAB in the last 72 hours. Anemia Panel: Recent Labs    04/22/19 0615  FERRITIN 90  TIBC 207*  IRON 49   Sepsis Labs: No results for input(s): PROCALCITON, LATICACIDVEN in the last 168 hours.  Recent Results (from the past 240 hour(s))  SARS Coronavirus 2 (CEPHEID - Performed in [place] hospital lab), Hosp Order     Status: None   Collection Time: 04/19/19  2:08 PM   Specimen: Nasopharyngeal Swab  Result Value Ref Range Status   SARS Coronavirus 2 NEGATIVE NEGATIVE Final    Comment: (NOTE) If result is NEGATIVE SARS-CoV-2 target nucleic acids are NOT DETECTED. The SARS-CoV-2 RNA is generally detectable in upper and lower  respiratory specimens during the acute phase of infection. The lowest  concentration of SARS-CoV-2 viral copies this assay can detect is 250  copies / mL. A negative result does not preclude SARS-CoV-2 infection  and should not be used as the sole basis for treatment or other  patient management decisions.  A  negative result may occur with  improper specimen collection / handling, submission of specimen other  than nasopharyngeal swab, presence of viral mutation(s) within the  areas targeted by this assay, and inadequate number of viral copies  (<250 copies / mL). A negative result must be combined with clinical  observations, patient history, and epidemiological information. If result is POSITIVE SARS-CoV-2 target nucleic acids are DETECTED. The SARS-CoV-2 RNA is generally detectable in upper and lower  respiratory specimens dur ing the acute phase of infection.  Positive  results are indicative of active infection with SARS-CoV-2.  Clinical  correlation with patient history and other diagnostic information is  necessary to determine patient infection status.  Positive results do  not rule  out bacterial infection or co-infection with other viruses. If result is PRESUMPTIVE POSTIVE SARS-CoV-2 nucleic acids MAY BE PRESENT.   A presumptive positive result was obtained on the submitted specimen  and confirmed on repeat testing.  While 2019 novel coronavirus  (SARS-CoV-2) nucleic acids may be present in the submitted sample  additional confirmatory testing may be necessary for epidemiological  and / or clinical management purposes  to differentiate between  SARS-CoV-2 and other Sarbecovirus currently known to infect humans.  If clinically indicated additional testing with an alternate test  methodology [phone]) is advised. The SARS-CoV-2 RNA is generally  detectable in upper and lower respiratory sp ecimens during the acute  phase of infection. The expected result is Negative. Fact Sheet for Patients:  StrictlyIdeas.no Fact Sheet for Healthcare Providers: BankingDealers.co.za This test is not yet approved or cleared by the [place] FDA and has been authorized for detection and/or diagnosis of SARS-CoV-2 by FDA under an Emergency Use  Authorization (EUA).  This EUA will remain in effect (meaning this test can be used) for the duration of the COVID-19 declaration under Section 564(b)(1) of the Act, 21 U.S.C. section 360bbb-3(b)(1), unless the authorization is terminated or revoked sooner. Performed at [facility], [place] [address], [place], [place] [address]          Radiology Studies: Nm Myocar Multi W/spect W/wall Motion / Ef  Result Date: 04/21/2019  There was no ST segment deviation noted during stress.  No T wave inversion was noted during stress.  Findings consistent with ischemia.  This is a high risk study.  The left ventricular ejection fraction is moderately decreased (30-44%).  Moderate sized area of anterolateral wall ischemia at mid and basal level with apical thinning Diffuse hypokinesis worse in the apex EF 31%        Scheduled Meds: . allopurinol  100 mg Oral BID  . aspirin EC  81 mg Oral Daily  . atorvastatin  40 mg Oral q1800  . carvedilol  6.25 mg Oral BID WC  . cholecalciferol  1,000 Units Oral Daily  . docusate sodium  100 mg Oral Daily  . gabapentin  100 mg Oral QHS  . insulin aspart  0-5 Units Subcutaneous QHS  . insulin aspart  0-9 Units Subcutaneous TID WC  . ipratropium-albuterol  3 mL Nebulization BID  . isosorbide-hydrALAZINE  1 tablet Oral TID  . mouth rinse  15 mL Mouth Rinse BID  . pantoprazole  40 mg Oral Daily  . sodium bicarbonate  650 mg Oral Daily  . sodium chloride flush  3 mL Intravenous Q12H  . terazosin  5 mg Oral q morning - 10a   Continuous Infusions: . sodium chloride    . sodium chloride 50 mL/hr at 04/23/19 0653     LOS: 2 days    Time spent: 28 minutes.     Hosie Poisson, MD [facility] Pager [phone]   If 7PM-7AM, please contact night-coverage [email or web address] Password [facility] 04/23/2019, 9:37 AM

## 2019-04-23 NOTE — Interval H&P Note (Signed)
History and Physical Interval Note:  04/23/2019 12:03 PM  [name]  has presented today for surgery, with the diagnosis of abnormal stress test.  The various methods of treatment have been discussed with the patient and family. After consideration of risks, benefits and other options for treatment, the patient has consented to  Procedure(s): LEFT HEART CATH AND CORONARY ANGIOGRAPHY (N/A) as a surgical intervention.  The patient's history has been reviewed, patient examined, no change in status, stable for surgery.  I have reviewed the patient's chart and labs.  Questions were answered to the patient's satisfaction.     Sherren Mocha

## 2019-04-23 NOTE — Progress Notes (Signed)
Bernard Jeans, MD with Nephrology came in and ordered for post cath fluids to be stopped. I d/c'd fluids an d/c'd order

## 2019-04-23 NOTE — Progress Notes (Signed)
[facility] called and given report and here to transfer patient to [facility]. Patient personal belongings gathered and sent with patient, including cell phone, charger, clothes, shoes, and bracelet. Patient has no further concerns/questions at this time.

## 2019-04-23 NOTE — Progress Notes (Signed)
cbg = 79.  Pt given graham crackers, peanut butter and ginger ale.

## 2019-04-23 NOTE — Progress Notes (Signed)
Progress Note  Patient Name: [name] Date of Encounter: 04/23/2019  Primary Cardiologist: [facility]   Subjective   Uneventful night. Denies Dyspnea. INR 1.4, Creat >6.  Inpatient Medications    Scheduled Meds: . [MAR Hold] allopurinol  100 mg Oral BID  . [MAR Hold] aspirin EC  81 mg Oral Daily  . [MAR Hold] atorvastatin  40 mg Oral q1800  . [MAR Hold] carvedilol  6.25 mg Oral BID WC  . [MAR Hold] cholecalciferol  1,000 Units Oral Daily  . [MAR Hold] docusate sodium  100 mg Oral Daily  . [MAR Hold] gabapentin  100 mg Oral QHS  . [MAR Hold] insulin aspart  0-5 Units Subcutaneous QHS  . [MAR Hold] insulin aspart  0-9 Units Subcutaneous TID WC  . [MAR Hold] ipratropium-albuterol  3 mL Nebulization BID  . [MAR Hold] isosorbide-hydrALAZINE  1 tablet Oral TID  . [MAR Hold] mouth rinse  15 mL Mouth Rinse BID  . [MAR Hold] pantoprazole  40 mg Oral Daily  . [MAR Hold] sodium bicarbonate  650 mg Oral Daily  . sodium chloride flush  3 mL Intravenous Q12H  . [MAR Hold] terazosin  5 mg Oral q morning - 10a   Continuous Infusions: . sodium chloride    . sodium chloride 50 mL/hr at 04/23/19 1025   PRN Meds: sodium chloride, [MAR Hold] albuterol, [MAR Hold] nitroGLYCERIN, [MAR Hold] oxyCODONE-acetaminophen, sodium chloride flush   Vital Signs    Vitals:   04/23/19 0452 04/23/19 0551 04/23/19 0819 04/23/19 0911  BP: (!) 144/80   (!) 145/80  Pulse: 70   66  Resp: 20     Temp: 98.8 F (37.1 C)     TempSrc: Oral     SpO2: 93%  93% 94%  Weight:  97.1 kg    Height:        Intake/Output Summary (Last 24 hours) at 04/23/2019 1150 Last data filed at 04/23/2019 0917 Gross per 24 hour  Intake 738.92 ml  Output 950 ml  Net -211.08 ml   Last 3 Weights 04/23/2019 04/22/2019 04/21/2019  Weight (lbs) 214 lb 1.1 oz 216 lb 1.6 oz 217 lb 8 oz  Weight (kg) 97.1 kg 98.022 kg 98.657 kg   Telemetry    NSR, brief NSVT 6 beats - Personally Reviewed  ECG    No new tracing -  Personally Reviewed  Physical Exam  Obese GEN: No acute distress.   Neck: 7-8 cm JVD Cardiac: RRR, no murmurs, rubs, or gallops.  Respiratory: Clear to auscultation bilaterally. GI: Soft, nontender, non-distended  MS: No edema; No deformity. Neuro:  Nonfocal  Psych: Normal affect   Labs    High Sensitivity Troponin:   Recent Labs  Lab 04/19/19 1346 04/19/19 1546 04/19/19 2314  TROPONINIHS 34* 31* 35*      Cardiac EnzymesNo results for input(s): TROPONINI in the last 168 hours. No results for input(s): TROPIPOC in the last 168 hours.   Chemistry Recent Labs  Lab 04/21/19 1419 04/22/19 0615 04/23/19 0649  NA 137 138 136  K 4.8 4.5 4.6  CL 109 108 105  CO2 17* 20* 20*  GLUCOSE 152* 92 81  BUN 58* 59* 70*  CREATININE 5.54* 5.56* 6.42*  CALCIUM 8.6* 8.9 8.9  GFRNONAA 9* 9* 8*  GFRAA 11* 11* 9*  ANIONGAP 11 10 11      Hematology Recent Labs  Lab 04/19/19 1346 04/20/19 0446 04/23/19 0649  WBC 7.2 8.3 7.8  RBC 2.90* 3.22* 2.81*  HGB 8.8* 9.7* 8.5*  HCT 27.0* 30.3* 26.4*  MCV 93.1 94.1 94.0  MCH 30.3 30.1 30.2  MCHC 32.6 32.0 32.2  RDW 15.5 15.3 15.3  PLT 146* 162 150    BNPNo results for input(s): BNP, PROBNP in the last 168 hours.   DDimer  Recent Labs  Lab 04/19/19 1408  DDIMER 0.81*     Radiology    No results found.  Cardiac Studies   ECHO 04/20/2019 1. The left ventricle has moderate-severely reduced systolic function, with an ejection fraction of 30-35%. The cavity size was mildly dilated. There is moderately increased left ventricular wall thickness. Left ventricular diastolic Doppler parameters  are indeterminate. There is incoordinate septal motion. Left ventricular diffuse hypokinesis. 2. Mildly thickened tricuspid valve leaflets. 3. The mitral valve is abnormal. Moderate thickening of the mitral valve leaflet. 4. Moderately sized vegetation on the tricuspid valve. 5. The tricuspid valve is abnormal. 6. The aortic valve is  tricuspid. No stenosis of the aortic valve. 7. The aorta is normal in size and structure. 8. The inferior vena cava was normal in size with <50% respiratory variability.  Nuclear stress test 04/21/2019   There was no ST segment deviation noted during stress.  No T wave inversion was noted during stress.  Findings consistent with ischemia.  This is a high risk study.  The left ventricular ejection fraction is moderately decreased (30-44%).   Moderate sized area of anterolateral wall ischemia at mid and basal level with apical thinning Diffuse hypokinesis worse in the apex EF 31%    Patient Profile     78 y.o. male with remote stent to LAD, new onset systolic HF, anterolateral ischemia, LBBB and stage V CKD approaching HD  Assessment & Plan    1. 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 For cath today. Still appears slightly hypervolemic, but will get hemodynamic invasive assessment today. Antihypertensives have been transitioned to Bidil, carvedilol. Terazosin disadvantageous in CHF, would prefer an newer generation alpha blocker if the [place] carries it. Brief NSVT noted. LBBB present. Difficult candidate for CRT-D with imminent need for HD. 2. CAD: (3.0 x 20 mm Promus) placed in mid LAD 2012 during acute coronary sd. Had angina at that time, none now. Anterolateral ischemia on nuclear test, not typical for LBBB artifact. 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 3. CKD: very high risk of contrast nephrotoxicity and earlier need for HD. We have discussed this with the patient several times, in depth. He was starting preparations for AVF access at the [facility] a few weeks ago. Even if no PCI is needed, will keep at [facility] for possible need for urgent/emergent HD if he becomes anuric. Dr. Harrie Jeans following. 4. 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 I do not think that he needs to restart warfarin (especially if he gets a PCI and  needs DAPT).      For questions or updates, please contact [place] Please consult [email or web address] for contact info under        Signed, Sanda Klein, MD  04/23/2019, 11:50 AM

## 2019-04-23 NOTE — Progress Notes (Signed)
As I was looking through the chart, I was needing clarification if the patient needs to be on IV heparin. I called Dr. Silas [place] and he stated he didn't know and asked me to call cardiology. I paged [place], [place]. Awaiting return of call. I passed this information to [name] who is resuming care for patient

## 2019-04-23 NOTE — Progress Notes (Signed)
Spoke with [place], [place] pertaining to need for IV heparin since patient had previously been anticoagulated with Coumadin; which has been on hold for cardiac catheterization and INR not therapeutic this morning per lab work.  Patient has subcutaneous heparin ordered for DVT prophylaxis, no new orders received at this time.

## 2019-04-24 DIAGNOSIS — I5023 Acute on chronic systolic (congestive) heart failure: Secondary | ICD-10-CM

## 2019-04-24 DIAGNOSIS — I259 Chronic ischemic heart disease, unspecified: Secondary | ICD-10-CM

## 2019-04-24 DIAGNOSIS — D631 Anemia in chronic kidney disease: Secondary | ICD-10-CM

## 2019-04-24 DIAGNOSIS — I5033 Acute on chronic diastolic (congestive) heart failure: Secondary | ICD-10-CM

## 2019-04-24 DIAGNOSIS — D649 Anemia, unspecified: Secondary | ICD-10-CM

## 2019-04-24 LAB — RENAL FUNCTION PANEL
Albumin: 3.1 g/dL — ABNORMAL LOW (ref 3.5–5.0)
Anion gap: 9 (ref 5–15)
BUN: 70 mg/dL — ABNORMAL HIGH (ref 8–23)
CO2: 19 mmol/L — ABNORMAL LOW (ref 22–32)
Calcium: 8.7 mg/dL — ABNORMAL LOW (ref 8.9–10.3)
Chloride: 108 mmol/L (ref 98–111)
Creatinine, Ser: 6.09 mg/dL — ABNORMAL HIGH (ref 0.61–1.24)
GFR calc Af Amer: 9 mL/min — ABNORMAL LOW (ref >60)
GFR calc non Af Amer: 8 mL/min — ABNORMAL LOW (ref >60)
Glucose, Bld: 95 mg/dL (ref 70–99)
Phosphorus: 5.4 mg/dL — ABNORMAL HIGH (ref 2.5–4.6)
Potassium: 4.9 mmol/L (ref 3.5–5.1)
Sodium: 136 mmol/L (ref 135–145)

## 2019-04-24 LAB — PROTIME-INR
INR: 1.4 — ABNORMAL HIGH (ref 0.8–1.2)
Prothrombin Time: 17.2 seconds — ABNORMAL HIGH (ref 11.4–15.2)

## 2019-04-24 LAB — PARATHYROID HORMONE, INTACT (NO CA): PTH: 97 pg/mL — ABNORMAL HIGH (ref 15–65)

## 2019-04-24 LAB — GLUCOSE, CAPILLARY
Glucose-Capillary: 84 mg/dL (ref 70–99)
Glucose-Capillary: 118 mg/dL — ABNORMAL HIGH (ref 70–99)
Glucose-Capillary: 111 mg/dL — ABNORMAL HIGH (ref 70–99)
Glucose-Capillary: 103 mg/dL — ABNORMAL HIGH (ref 70–99)

## 2019-04-24 MED ORDER — FUROSEMIDE 10 MG/ML IJ SOLN
60.0000 mg | Freq: Once | INTRAMUSCULAR | Status: AC
  Administered 2019-04-24: 08:00:00 60 mg via INTRAVENOUS
  Filled 2019-04-24: qty 6

## 2019-04-24 MED ORDER — IPRATROPIUM-ALBUTEROL 0.5-2.5 (3) MG/3ML IN SOLN
3.0000 mL | RESPIRATORY_TRACT | Status: DC | PRN
  Administered 2019-04-24: 3 mL via RESPIRATORY_TRACT
  Filled 2019-04-24: qty 3

## 2019-04-24 MED ORDER — CALCITRIOL 0.25 MCG PO CAPS: 0.2500 ug | ORAL_CAPSULE | ORAL | Status: DC

## 2019-04-24 NOTE — Progress Notes (Signed)
PROGRESS NOTE    Tailor Westfall  VQX:[id number] DOB: [birth year] DOA: 04/19/2019 PCP: No primary care provider on file.   Brief Narrative:  The patient [name] is a 78 year old African-American obese male with a past medical history significant for but limited to hypertension, prostate cancer, CKD stage V, coronary artery disease, type 2 diabetes mellitus and treatment and was gout, history of acute DVT currently on Coumadin and other comorbidities who presents to the emergency room with a history of gradual progressive shortness of breath for about a week.  According to the patient for the last 1 to 2 weeks he become more more progressively short of breath and gradually worsening symptoms with occasional wheezing no cough or fever and this was more so at nighttime without any chest pain.  He was worked up and found to have a low EF and a nuclear stress test was then done which showed moderate sized area of anterior lateral wall ischemia at the mid and basal level with apical thinning.  There is also diffuse hypokinesis worse in the apex and he was transferred from [facility] to [facility] for cardiac catheterization which showed medical disease and mentation was for medical management for LAD and PAD disease.  Cardiology is involved in the recommending continue aspirin, carvedilol, atorvastatin.  Patient CKD stage V worsened and nephrology is following and recommending initiation of dialysis and patient is to undergo vein mapping today with placement of temporary dialysis catheter soon and initiation of dialysis on Monday.   Assessment & Plan:   Principal Problem:   Shortness of breath Active Problems:   Coronary artery disease   Hypertension   Chronic kidney disease   Chest pain   Acute combined systolic and diastolic heart failure (HCC)   CKD (chronic kidney disease) stage 5, GFR less than 15 ml/min (HCC)   NSVT (nonsustained ventricular tachycardia) (HCC)   LBBB (left bundle branch  block)   Class 2 severe obesity due to excess calories with serious comorbidity and body mass index (BMI) of 38.0 to 38.9 in adult (HCC)   Anemia  Dyspnea on exertion likely secondary to Chronic systolic CHF along with Cardiomyopathy -Patient given a dose of IV diuresis at 60 mg once per nephrology -Work-up for his cardiomyopathy and cardiology feels his cardiomyopathy is out of proportion to his CAD as patient underwent a cardiac catheterization yesterday.  -Cardiology recommending continue aspirin, carvedilol and statin -Nephrology recommending initiation of dialysis and patient is going for vein mapping -Further care per nephrology and cardiology -We will continue DuoNeb every 2 hours PRN as well as isosorbide-hydralazine 1 tab p.o. 3 times daily for heart failure along with nitroglycerin  Acute on chronic systolic CHF -As above given diuresis -Continue with isosorbide hydralazine as well as carvedilol -Strict I's and O's and daily weights -Further volume management to be initiated once he is started on dialysis  CKD stage V Hyperphosphatemia in the setting of worsening renal dysfunction Metabolic acidosis in the setting of CKD -In the setting of likely hypertension diabetes -Patient's BUN/creatinine today was 70/6.09 -Nephrology feels that there is no emergent indication for hemodialysis today and have left his n.p.o. status -They are planning on initiating hemodialysis on day 3 after tunneled catheters placed and they are going to obtain vein-today -Is given diuresis with IV Lasix once for his nephrology acute systolic CHF -Further care per nephrology -Continue with serum bicarbonate 650 mg p.o. daily continue with calcitriol 0.25 mg p.o. every Monday Wednesday Friday as well as cholecalciferol  1000 units p.o. daily -Patient was also given a dose of Feraheme  History of CAD with PCI to LAD in 2012 -Continue with aspirin, statin, and carvedilol -Continue with nitroglycerin 0.4 mg  sublingually q. 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  OSA -Continue CPAP  History of DVT -Had an infrapopliteal DVT in September 2019 and has had 10 months of anticoagulation and cardiology recommends no longer taking warfarin now as he has had 10 months of treatment  Thrombocytopenia -Patient's platelet count went from 150,001 131,000 -We will continue monitor for signs of bleeding -Repeat CBC in a.m.  Hypertension -Blood pressures been borderline-continue with carvedilol as well as isosorbide-hydralazine  Hyperlipidemia -continue statin  Obesity -Estimated body mass index is 32.58 kg/m as calculated from the following:   Height as of this encounter: 5\' 8"  (1.727 m).   Weight as of this encounter: 97.2 kg. -Weight Loss and Dietary Counseling given   DVT prophylaxis: Heparin 5000 units subcu every 8 now that he is no longer on warfarin Code Status: FULL CODE  Family Communication: No family present at bedside  Disposition Plan: Remain Inpatient for Initiation of Dialysis on Monday   Consultants:   Cardiology  Nephrology  Procedures:  ECHOCARDIOGRAM 04/20/2019 IMPRESSIONS    1. The left ventricle has moderate-severely reduced systolic function, with an ejection fraction of 30-35%. The cavity size was mildly dilated. There is moderately increased left ventricular wall thickness. Left ventricular diastolic Doppler parameters  are indeterminate. There is incoordinate septal motion. Left ventricular diffuse  hypokinesis.  2. Mildly thickened tricuspid valve leaflets.  3. The mitral valve is abnormal. Moderate thickening of the mitral valve leaflet.  4. Moderately sized vegetation on the tricuspid valve.  5. The tricuspid valve is abnormal.  6. The aortic valve is tricuspid. No stenosis of the aortic valve.  7. The aorta is normal in size and structure.  8. The inferior vena cava was normal in size with <50% respiratory variability.  SUMMARY   LVEF 30-35%, severe global hypokinesis with regional variation, moderate LVH, mildly reduced RV systolic function, mobile 0.5 x 1.0 cm hyperechoic mass noted on the anterior leaflet of the tricuspid valve, seen prolapsing into the right atrium (consider thrombus, tumor or less likely vegetation), mitral valve thickening with mild regurgitation. Consider TEE to further evaluate the tricuspid valve.  FINDINGS  Left Ventricle: The left ventricle has moderate-severely reduced systolic function, with an ejection fraction of 30-35%. The cavity size was mildly dilated. There is moderately increased left ventricular wall thickness. Left ventricular diastolic  Doppler parameters are indeterminate. There is incoordinate septal motion. Left ventricular diffuse hypokinesis.  Right Ventricle: The right ventricle has mildly reduced systolic function. The cavity was normal. There is no increase in right ventricular wall thickness.  Left Atrium: Left atrial size was normal in size.  Right Atrium: Right atrial size was normal in size. Right atrial pressure is estimated at 10 mmHg.  Interatrial Septum: No atrial level shunt detected by color flow Doppler.  Pericardium: There is no evidence of pericardial effusion.  Mitral Valve: The mitral valve  is abnormal. Moderate thickening of the mitral valve leaflet. Mitral valve regurgitation is mild by color flow Doppler.  Tricuspid Valve: The tricuspid valve is abnormal. Tricuspid valve regurgitation is mild by color  flow Doppler. The tricuspid valve is mildly thickened. There is a moderately sized mobile tricuspid valve vegetation on the anterior leaflet. The TV  vegetation measures 5 mm x 10 mm.  Aortic Valve: The aortic valve is tricuspid Aortic valve regurgitation was not visualized by color flow Doppler. There is No stenosis of the aortic valve.  Pulmonic Valve: The pulmonic valve was grossly normal. Pulmonic valve regurgitation is not visualized by color flow Doppler.  Aorta: The aorta is normal in size and structure.  Venous: The inferior vena cava is normal in size with less than 50% respiratory variability.    +--------------+--------++ LEFT VENTRICLE         +--------------+--------++ PLAX 2D                +--------------+--------++ LVIDd:        6.10 cm  +--------------+--------++ LVIDs:        5.20 cm  +--------------+--------++ LV PW:        1.40 cm  +--------------+--------++ LV IVS:       1.40 cm  +--------------+--------++ LVOT diam:    2.10 cm  +--------------+--------++ LV SV:        57 ml    +--------------+--------++ LV SV Index:  25.77    +--------------+--------++ LVOT Area:    3.46 cm +--------------+--------++                        +--------------+--------++  +---------------+-------++-----------++ LEFT ATRIUM           Index       +---------------+-------++-----------++ LA diam:       4.50 cm2.11 cm/m  +---------------+-------++-----------++ LA Vol (A2C):  61.1 ml28.66 ml/m +---------------+-------++-----------++ LA Vol (A4C):  34.1 ml15.99 ml/m +---------------+-------++-----------++ LA Biplane Vol:46.9 ml22.00 ml/m +---------------+-------++-----------++ +------------+---------+++ RIGHT ATRIUM          +------------+---------+++ RA Pressure:8.00 mmHg +------------+---------+++  +------------+-----------++ AORTIC VALVE             +------------+-----------++ LVOT Vmax:  95.40 cm/s  +------------+-----------++ LVOT Vmean: 56.000 cm/s +------------+-----------++ LVOT VTI:   0.162 m     +------------+-----------++   +-------------+-------++ AORTA                +-------------+-------++ Ao Root diam:3.50 cm +-------------+-------++  +--------------+----------++  +---------------+---------++ MITRAL VALVE              TRICUSPID VALVE          +--------------+----------++  +---------------+---------++ MV Area (PHT):4.80 cm    Estimated RAP: 8.00 mmHg +--------------+----------++  +---------------+---------++ MV PHT:       45.82 msec +--------------+----------++  +--------------+-------+ MV Decel Time:158 msec    SHUNTS                +--------------+----------++  +--------------+-------+ +--------------+-----------++ Systemic VTI: 0.16 m  MV E velocity:114.00 cm/s +--------------+-------+ +--------------+-----------++ Systemic Diam:2.10 cm                               +--------------+-------+  NM STRESS TEST 04/21/2019 There was no ST segment deviation noted during stress.  No T wave inversion was noted during stress.  Findings consistent with ischemia.  This is a high risk study.  The left ventricular ejection fraction is moderately decreased (30-44%).  Moderate sized area of anterolateral wall ischemia at mid and basal level with apical thinning Diffuse hypokinesis worse in the apex EF 31%  CARDIAC CATHETERIZATION 04/23/2019 1. Mild RCA stenosis with severe stenosis in the mid/distal PDA (appropriate for medical therapy with small rea of myocardium supplied) 2. Widely patent left main 3. Diffusely diseased LAD with moderate proximal stenosis and moderate in-stent restenosis in the mid-vessel 4. Widely patent left circumflex 5. Moderately elevated LVEDP  Recommend: medical therapy for CAD and CHF. Cardiomyopathy out of proportion to  extent of CAD  VEIN MAPPING   Antimicrobials:  Anti-infectives (From admission, onward)   None     Subjective: Seen and examined at bedside and states that he had a little bit of shortness of breath.  Denies any nausea or vomiting.  States he felt okay.  No other concerns complaints at this time and understands that he will be going for dialysis on Monday.  Objective: Vitals:   04/24/19 0419 04/24/19 0820 04/24/19 0848 04/24/19 1340  BP: (!) 142/90 140/84  115/69  Pulse: 71 71  65  Resp: 17   (!) 22  Temp: 98.8 F (37.1 C)   97.7 F (36.5 C)  TempSrc: Oral   Oral  SpO2: 97%  98% 94%  Weight: 97.2 kg     Height:        Intake/Output Summary (Last 24 hours) at 04/24/2019 1631 Last data filed at 04/24/2019 1551 Gross per 24 hour  Intake 1130 ml  Output 900 ml  Net 230 ml   Filed Weights   04/22/19 0603 04/23/19 0551 04/24/19 0419  Weight: 98 kg 97.1 kg 97.2 kg   Examination: Physical Exam:  Constitutional: WN/WD obese AAM in NAD and appears calm Eyes: Lids and conjunctivae normal, sclerae anicteric  ENMT: External Ears, Nose appear normal. Grossly normal hearing. Mucous membranes are moist. Neck: Appears normal, supple, no cervical masses, normal ROM, no appreciable thyromegaly;  Respiratory: Diminished to auscultation bilaterally, no wheezing, rales, rhonchi or crackles. Normal respiratory effort and patient is not tachypenic. No accessory muscle use.  Has unlabored breathing Cardiovascular: RRR, no murmurs / rubs / gallops. S1 and S2 auscultated.  Trace to 1+ LE extremity edema.  Abdomen: Soft, non-tender, Distended due to body habitus. No masses palpated. No appreciable hepatosplenomegaly. Bowel sounds positive x4.  GU: Deferred. Musculoskeletal: No clubbing / cyanosis of digits/nails. No joint deformity upper and lower extremities. Skin: No rashes, lesions, ulcers on a limited skin evaluation. No induration; Warm and dry.  Neurologic: CN 2-12 grossly intact with no  focal deficits. Romberg sign and cerebellar reflexes not assessed.  Psychiatric: Normal judgment and insight. Alert and oriented x 3.  Mildly anxious mood and appropriate affect.   Data Reviewed: I have personally reviewed following labs and imaging studies  CBC: Recent Labs  Lab 04/19/19 1346 04/20/19 0446 04/23/19 0649 04/23/19 1452  WBC 7.2 8.3 7.8 6.2  HGB 8.8* 9.7* 8.5* 8.5*  HCT 27.0* 30.3* 26.4* 25.6*  MCV 93.1 94.1 94.0 90.8  PLT 146* 162 150 169*   Basic Metabolic Panel: Recent Labs  Lab 04/20/19 0446 04/21/19 1419 04/22/19 0615 04/23/19 0649 04/23/19 1452 04/24/19 0416  NA 141 137 138 136  --  136  K 4.5 4.8 4.5 4.6  --  4.9  CL 112* 109 108 105  --  108  CO2 18* 17* 20* 20*  --  19*  GLUCOSE 83 152* 92 81  --  95  BUN 52* 58* 59* 70*  --  70*  CREATININE 5.65* 5.54* 5.56* 6.42* 6.31* 6.09*  CALCIUM 8.8* 8.6* 8.9 8.9  --  8.7*  MG  --   --   --  1.6*  --   --   PHOS  --   --  4.8*  --   --  5.4*   GFR: Estimated Creatinine Clearance: 11.7 mL/min (A) (by C-G formula based on SCr of 6.09 mg/dL (H)). Liver Function Tests: Recent Labs  Lab 04/24/19 0416  ALBUMIN 3.1*   No results for input(s): LIPASE, AMYLASE in the last 168 hours. No results for input(s): AMMONIA in the last 168 hours. Coagulation Profile: Recent Labs  Lab 04/20/19 0446 04/21/19 0720 04/22/19 0615 04/23/19 0649 04/24/19 0416  INR 2.9* 3.1* 2.2* 1.4* 1.4*   Cardiac Enzymes: No results for input(s): CKTOTAL, CKMB, CKMBINDEX, TROPONINI in the last 168 hours. BNP (last 3 results) No results for input(s): PROBNP in the last 8760 hours. HbA1C: Recent Labs    04/22/19 0615  HGBA1C 5.6   CBG: Recent Labs  Lab 04/23/19 1653 04/23/19 2054 04/24/19 0802 04/24/19 1221 04/24/19 1550  GLUCAP 166* 108* 84 118* 111*   Lipid Profile: No results for input(s): CHOL, HDL, LDLCALC, TRIG, CHOLHDL, LDLDIRECT in the last 72 hours. Thyroid Function Tests: No results for input(s): TSH,  T4TOTAL, FREET4, T3FREE, THYROIDAB in the last 72 hours. Anemia Panel: Recent Labs    04/22/19 0615  FERRITIN 90  TIBC 207*  IRON 49   Sepsis Labs: No results for input(s): PROCALCITON, LATICACIDVEN in the last 168 hours.  Recent Results (from the past 240 hour(s))  SARS Coronavirus 2 (CEPHEID - Performed in [place] hospital lab), Hosp Order     Status: None   Collection Time: 04/19/19  2:08 PM   Specimen: Nasopharyngeal Swab  Result Value Ref Range Status   SARS Coronavirus 2 NEGATIVE NEGATIVE Final    Comment: (NOTE) If result is NEGATIVE SARS-CoV-2 target nucleic acids are NOT DETECTED. The SARS-CoV-2 RNA is generally detectable in upper and lower  respiratory specimens during the acute phase of infection. The lowest  concentration of SARS-CoV-2 viral copies this assay can detect is 250  copies / mL. A negative result does not preclude SARS-CoV-2 infection  and should not be used as the sole basis for treatment or other  patient management decisions.  A negative result may occur with  improper specimen collection / handling, submission of specimen other  than nasopharyngeal swab, presence of viral mutation(s) within the  areas targeted by this assay, and inadequate number of viral copies  (<250 copies / mL). A negative result must be combined with clinical  observations, patient history, and epidemiological information. If result is POSITIVE SARS-CoV-2 target nucleic acids are DETECTED. The SARS-CoV-2 RNA is generally detectable in upper and lower  respiratory specimens dur ing the acute phase of infection.  Positive  results are indicative of active infection with SARS-CoV-2.  Clinical  correlation with patient history and other diagnostic information is  necessary to determine patient infection status.  Positive results do  not rule out bacterial infection or co-infection with other viruses. If result is PRESUMPTIVE POSTIVE SARS-CoV-2 nucleic acids MAY BE PRESENT.    A presumptive positive result was obtained on the submitted specimen  and confirmed on repeat testing.  While 2019 novel coronavirus  (SARS-CoV-2) nucleic acids may be present in the submitted sample  additional confirmatory testing may be necessary for epidemiological  and / or clinical management purposes  to differentiate between  SARS-CoV-2 and other Sarbecovirus currently known to infect humans.  If clinically indicated additional testing with an alternate test  methodology [phone]) is advised. The SARS-CoV-2 RNA is generally  detectable in upper and lower respiratory sp ecimens during the acute  phase of infection. The expected result is Negative. Fact Sheet for Patients:  StrictlyIdeas.no Fact Sheet for Healthcare Providers: BankingDealers.co.za This test is not yet approved or cleared by the [place] FDA and has been authorized for detection and/or diagnosis of SARS-CoV-2 by FDA under an Emergency Use Authorization (EUA).  This EUA will remain in effect (meaning this test can be used) for the duration of the COVID-19 declaration under Section 564(b)(1) of the Act, 21 U.S.C. section 360bbb-3(b)(1), unless the authorization is terminated or revoked sooner. Performed at [facility], [place] [address] [place], [place] [address]     Radiology Studies: No results found.  Scheduled Meds: . allopurinol  100 mg Oral BID  . aspirin EC  81 mg Oral Daily  . atorvastatin  40 mg Oral q1800  . [START ON 04/26/2019] calcitRIOL  0.25 mcg Oral Q M,W,F-HD  . carvedilol  6.25 mg Oral BID WC  . cholecalciferol  1,000 Units Oral Daily  . docusate sodium  100 mg Oral Daily  . gabapentin  100 mg Oral QHS  . heparin  5,000 Units Subcutaneous Q8H  . insulin aspart  0-5 Units Subcutaneous QHS  . insulin aspart  0-9 Units Subcutaneous TID WC  . isosorbide-hydrALAZINE  1 tablet Oral TID  . mouth rinse  15 mL Mouth Rinse BID   . pantoprazole  40 mg Oral Daily  . sodium bicarbonate  650 mg Oral Daily  . sodium chloride flush  3 mL Intravenous Q12H  . terazosin  5 mg Oral q morning - 10a   Continuous Infusions:   LOS: 3 days   Kerney Elbe, DO [facility] PAGER is on AMION  If 7PM-7AM, please contact night-coverage [email or web address] Password [facility] 04/24/2019, 4:31 PM

## 2019-04-24 NOTE — Progress Notes (Signed)
[facility] Progress Note  Name: [name] MRN: [id number] DOB: 1941-03-10  Chief Complaint:  Shortness of breath   Subjective:  975 mL + 1 unquantified void over 7/31.  He would like to dialyze here in [place], not at the [place] unit which he states is in [place].  States he was on calcitriol 0.5 mcg daily at home.  Has an appt for driver's license renewal on 8/5 and would like to be out of the hospital by then to make it  Review of systems:   Shortness of breath is better Denies n/v Denies chest pain -------------------- Background on consult:  [name] is a 78 y.o. 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  He has also noticed swelling in his feet.  Cardiology is seeing him for acute systolic CHF with EF of 00-34%.  He states prior heart stent placement in 2012.  They are considering a cath and they note that the dye load could likely ultimately put the patient on dialysis.  Creatinine trends as below: [facility].  The patient does not have a fistula in place.  He states that he has had vein mapping for a fistula but hasn't had a fistula placed.  He states that he was told that his veins may be too small for a fistula.  He thinks his recent kidney function is "10%".  He last saw nephrology last year in December 2019 and last had labs in December.  Dr. Gae Dry ?   He is not sure of the cause of his CKD but does have HTN and DM; he is now off of insulin as he passed out with hypoglycemia twice.  He states he does have changes to his eyes from diabetes.  He is not on lasix at home.  Breathing has gotten better which he attributes to inhalers but he still feels swollen.  He provides the number of [phone] for the [place].  not normally on iron or  ESA.  "I've been talking about dialysis for a year now".   He has been seeing Dr. [name] Banker at the [facility].     Intake/Output Summary (Last 24 hours) at 04/24/2019 0636 Last data filed at 04/24/2019 0431 Gross per 24 hour  Intake 428.92 ml  Output 975 ml  Net -546.08 ml    Vitals:  Vitals:   04/23/19 1758 04/23/19 1930 04/23/19 2057 04/24/19 0419  BP: (!) 151/84  114/66 (!) 142/90  Pulse:  62 67 71  Resp:    17  Temp: 99.4 F (37.4 C)  98.3 F (36.8 C) 98.8 F (37.1 C)  TempSrc:   Oral Oral  SpO2:    97%  Weight:    97.2 kg  Height:         Physical Exam:  General adult male in bed  HEENT normocephalic atraumatic extraocular movements intact sclera anicteric Neck supple trachea midline Lungs props himself up in bed, occasional wheezing Heart RRR; no rub Abdomen soft nontender nondistended Extremities trace edema lower extremities Psych normal mood and affect  Medications reviewed   Labs:  BMP Latest Ref Rng & Units 04/24/2019 04/23/2019 04/23/2019  Glucose 70 - 99 mg/dL 95 - 81  BUN 8 - 23 mg/dL 70(H) - 70(H)  Creatinine 0.61 - 1.24 mg/dL 6.09(H) 6.31(H)  6.42(H)  Sodium 135 - 145 mmol/L 136 - 136  Potassium 3.5 - 5.1 mmol/L 4.9 - 4.6  Chloride 98 - 111 mmol/L 108 - 105  CO2 22 - 32 mmol/L 19(L) - 20(L)  Calcium 8.9 - 10.3 mg/dL 8.7(L) - 8.9   Heart cath:  04/23/19 1. Mild RCA stenosis with severe stenosis in the mid/distal PDA (appropriate for medical therapy with small rea of myocardium supplied) 2. Widely patent left main 3. Diffusely diseased LAD with moderate proximal stenosis and moderate in-stent restenosis in the mid-vessel 4. Widely patent left circumflex 5. Moderately elevated LVEDP   Assessment/Plan:   # CKD stage V - Patient unsure of etiology - likely secondary to HTN and DM - No emergent indication for HD today - will lift NPO  - Plan for initiating HD on 8/3 after tunneled catheter placement - He would like to do dialysis in [place] - lives  off of [address]  - Will obtain vein mapping  - states he has an appt for driver's license renewal on 8/5 and would like to make this if possible - I let him know we would need to set up HD and go from there   # Acute systolic CHF  - s/p heart cath today with medical therapy recommended  - Lasix once now   # CAD - s/p heart cath with medical therapy recommended   # HTN with CKD - Acceptable control  # Metabolic acidosis  - 2/2 CKD  - lasix once  - supplemental bicarbonate 650 mg daily   # Anemia 2/2 CKD  - Secondary in part to CKD likely  - normocytic - s/p Feraheme x 1 - Defer ESA - prostate cancer (followed by Dr. 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

## 2019-04-24 NOTE — Progress Notes (Signed)
Progress Note  Patient Name: [name] Date of Encounter: 04/24/2019  Primary Cardiologist: [facility]   Subjective   Denies chest pain/palps/sob.  Inpatient Medications    Scheduled Meds: . allopurinol  100 mg Oral BID  . aspirin EC  81 mg Oral Daily  . atorvastatin  40 mg Oral q1800  . [START ON 04/26/2019] calcitRIOL  0.25 mcg Oral Q M,W,F-HD  . carvedilol  6.25 mg Oral BID WC  . cholecalciferol  1,000 Units Oral Daily  . docusate sodium  100 mg Oral Daily  . gabapentin  100 mg Oral QHS  . heparin  5,000 Units Subcutaneous Q8H  . insulin aspart  0-5 Units Subcutaneous QHS  . insulin aspart  0-9 Units Subcutaneous TID WC  . ipratropium-albuterol  3 mL Nebulization BID  . isosorbide-hydrALAZINE  1 tablet Oral TID  . mouth rinse  15 mL Mouth Rinse BID  . pantoprazole  40 mg Oral Daily  . sodium bicarbonate  650 mg Oral Daily  . sodium chloride flush  3 mL Intravenous Q12H  . terazosin  5 mg Oral q morning - 10a   Continuous Infusions:  PRN Meds: acetaminophen, albuterol, nitroGLYCERIN, ondansetron (ZOFRAN) IV, oxyCODONE-acetaminophen, sodium chloride flush   Vital Signs    Vitals:   04/23/19 2057 04/24/19 0419 04/24/19 0820 04/24/19 0848  BP: 114/66 (!) 142/90 140/84   Pulse: 67 71 71   Resp:  17    Temp: 98.3 F (36.8 C) 98.8 F (37.1 C)    TempSrc: Oral Oral    SpO2:  97%  98%  Weight:  97.2 kg    Height:        Intake/Output Summary (Last 24 hours) at 04/24/2019 0910 Last data filed at 04/24/2019 0431 Gross per 24 hour  Intake 290 ml  Output 975 ml  Net -685 ml   Filed Weights   04/22/19 0603 04/23/19 0551 04/24/19 0419  Weight: 98 kg 97.1 kg 97.2 kg    Telemetry    NSR, PVC's, NSVT - Personally Reviewed  ECG    NA - Personally Reviewed  Physical Exam   GEN: No acute distress.   Neck: No JVD Cardiac: RRR, no murmurs, rubs, or gallops.  Respiratory: Clear to auscultation bilaterally. GI: Soft, nontender, non-distended  MS: No  edema; No deformity. Neuro:  Nonfocal  Psych: Normal affect   Labs    Chemistry Recent Labs  Lab 04/22/19 0615 04/23/19 0649 04/23/19 1452 04/24/19 0416  NA 138 136  --  136  K 4.5 4.6  --  4.9  CL 108 105  --  108  CO2 20* 20*  --  19*  GLUCOSE 92 81  --  95  BUN 59* 70*  --  70*  CREATININE 5.56* 6.42* 6.31* 6.09*  CALCIUM 8.9 8.9  --  8.7*  ALBUMIN  --   --   --  3.1*  GFRNONAA 9* 8* 8* 8*  GFRAA 11* 9* 9* 9*  ANIONGAP 10 11  --  9     Hematology Recent Labs  Lab 04/20/19 0446 04/23/19 0649 04/23/19 1452  WBC 8.3 7.8 6.2  RBC 3.22* 2.81* 2.82*  HGB 9.7* 8.5* 8.5*  HCT 30.3* 26.4* 25.6*  MCV 94.1 94.0 90.8  MCH 30.1 30.2 30.1  MCHC 32.0 32.2 33.2  RDW 15.3 15.3 14.9  PLT 162 150 131*    Cardiac EnzymesNo results for input(s): TROPONINI in the last 168 hours. No results for input(s): TROPIPOC in the  last 168 hours.   BNPNo results for input(s): BNP, PROBNP in the last 168 hours.   DDimer  Recent Labs  Lab 04/19/19 1408  DDIMER 0.81*     Radiology    No results found.  Cardiac Studies   ECHO 04/20/2019 1. The left ventricle has moderate-severely reduced systolic function, with an ejection fraction of 30-35%. The cavity size was mildly dilated. There is moderately increased left ventricular wall thickness. Left ventricular diastolic Doppler parameters  are indeterminate. There is incoordinate septal motion. Left ventricular diffuse hypokinesis. 2. Mildly thickened tricuspid valve leaflets. 3. The mitral valve is abnormal. Moderate thickening of the mitral valve leaflet. 4. Moderately sized vegetation on the tricuspid valve. 5. The tricuspid valve is abnormal. 6. The aortic valve is tricuspid. No stenosis of the aortic valve. 7. The aorta is normal in size and structure. 8. The inferior vena cava was normal in size with <50% respiratory variability.  Nuclear stress test 04/21/2019   There was no ST segment deviation noted during  stress.  No T wave inversion was noted during stress.  Findings consistent with ischemia.  This is a high risk study.  The left ventricular ejection fraction is moderately decreased (30-44%).  Moderate sized area of anterolateral wall ischemia at mid and basal level with apical thinning Diffuse hypokinesis worse in the apex EF 31%   Cath 04/23/19:  1. Mild RCA stenosis with severe stenosis in the mid/distal PDA (appropriate for medical therapy with small rea of myocardium supplied) 2. Widely patent left main 3. Diffusely diseased LAD with moderate proximal stenosis and moderate in-stent restenosis in the mid-vessel 4. Widely patent left circumflex 5. Moderately elevated LVEDP  Recommend: medical therapy for CAD and CHF. Cardiomyopathy out of proportion to extent of CAD      Patient Profile     78 y.o. male with remote stent to LAD, new onset systolic HF, anterolateral ischemia, LBBB and stage V CKD approaching HD.  Assessment & Plan    1. CAD: Cath reviewed above. Medical management recommended for LAD and PDA disease. Denies anginal symptoms. 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 Continue ASA, carvedilol, and atorvastatin.  2. Chronic systolic CHF/cardiomyopathy: LVEF 30-35%. 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 LBBB present. Suboptimal candidate for CRT-D at present with imminent need for HD. -546 cc overnight. Nephrology following and adjusting diuretics. Got IV Lasix today. On carvedilol and Bidil.  3. CKD stage V: Nephrology following. Will need HD upcoming Monday.  4. HTN: BP borderline. No changes today.  5. Anemia of chronic disease: Hgb 8.5.  6. NSVT: Mg low 1.6. Receiving IV magnesium. On carvedilol.   For questions or updates, please contact [place] Please consult [email or web address] for contact info under Cardiology/STEMI.      Signed, Kate Sable, MD  04/24/2019, 9:10 AM

## 2019-04-25 ENCOUNTER — Inpatient Hospital Stay (HOSPITAL_COMMUNITY): Payer: No Typology Code available for payment source

## 2019-04-25 DIAGNOSIS — N186 End stage renal disease: Secondary | ICD-10-CM

## 2019-04-25 DIAGNOSIS — N185 Chronic kidney disease, stage 5: Secondary | ICD-10-CM

## 2019-04-25 LAB — CBC WITH DIFFERENTIAL/PLATELET
Abs Immature Granulocytes: 0.04 10*3/uL (ref 0.00–0.07)
Basophils Absolute: 0 10*3/uL (ref 0.0–0.1)
Basophils Relative: 0 %
Eosinophils Absolute: 0.4 10*3/uL (ref 0.0–0.5)
Eosinophils Relative: 5 %
HCT: 23.7 % — ABNORMAL LOW (ref 39.0–52.0)
Hemoglobin: 7.9 g/dL — ABNORMAL LOW (ref 13.0–17.0)
Immature Granulocytes: 1 %
Lymphocytes Relative: 30 %
Lymphs Abs: 2.4 10*3/uL (ref 0.7–4.0)
MCH: 30.2 pg (ref 26.0–34.0)
MCHC: 33.3 g/dL (ref 30.0–36.0)
MCV: 90.5 fL (ref 80.0–100.0)
Monocytes Absolute: 0.7 10*3/uL (ref 0.1–1.0)
Monocytes Relative: 9 %
Neutro Abs: 4.5 10*3/uL (ref 1.7–7.7)
Neutrophils Relative %: 55 %
Platelets: 174 10*3/uL (ref 150–400)
RBC: 2.62 MIL/uL — ABNORMAL LOW (ref 4.22–5.81)
RDW: 14.9 % (ref 11.5–15.5)
WBC: 8.1 10*3/uL (ref 4.0–10.5)
nRBC: 0.4 % — ABNORMAL HIGH (ref 0.0–0.2)

## 2019-04-25 LAB — COMPREHENSIVE METABOLIC PANEL
ALT: 11 U/L (ref 0–44)
AST: 19 U/L (ref 15–41)
Albumin: 3.4 g/dL — ABNORMAL LOW (ref 3.5–5.0)
Alkaline Phosphatase: 47 U/L (ref 38–126)
Anion gap: 12 (ref 5–15)
BUN: 74 mg/dL — ABNORMAL HIGH (ref 8–23)
CO2: 19 mmol/L — ABNORMAL LOW (ref 22–32)
Calcium: 8.9 mg/dL (ref 8.9–10.3)
Chloride: 105 mmol/L (ref 98–111)
Creatinine, Ser: 6.19 mg/dL — ABNORMAL HIGH (ref 0.61–1.24)
GFR calc Af Amer: 9 mL/min — ABNORMAL LOW (ref >60)
GFR calc non Af Amer: 8 mL/min — ABNORMAL LOW (ref >60)
Glucose, Bld: 79 mg/dL (ref 70–99)
Potassium: 4.8 mmol/L (ref 3.5–5.1)
Sodium: 136 mmol/L (ref 135–145)
Total Bilirubin: 0.6 mg/dL (ref 0.3–1.2)
Total Protein: 7.5 g/dL (ref 6.5–8.1)

## 2019-04-25 LAB — GLUCOSE, CAPILLARY
Glucose-Capillary: 85 mg/dL (ref 70–99)
Glucose-Capillary: 86 mg/dL (ref 70–99)
Glucose-Capillary: 104 mg/dL — ABNORMAL HIGH (ref 70–99)
Glucose-Capillary: 83 mg/dL (ref 70–99)

## 2019-04-25 LAB — MAGNESIUM: Magnesium: 2.1 mg/dL (ref 1.7–2.4)

## 2019-04-25 LAB — PHOSPHORUS: Phosphorus: 5.2 mg/dL — ABNORMAL HIGH (ref 2.5–4.6)

## 2019-04-25 LAB — PROTIME-INR
INR: 1.2 (ref 0.8–1.2)
Prothrombin Time: 15.2 seconds (ref 11.4–15.2)

## 2019-04-25 MED ORDER — SODIUM BICARBONATE 650 MG PO TABS
650.0000 mg | ORAL_TABLET | Freq: Two times a day (BID) | ORAL | Status: DC
  Administered 2019-04-25 – 2019-04-26 (×3): 650 mg via ORAL
  Filled 2019-04-25 (×3): qty 1

## 2019-04-25 MED ORDER — SODIUM CHLORIDE 0.9 % IV SOLN
100.0000 mL | INTRAVENOUS | Status: DC | PRN
  Administered 2019-04-26: 09:00:00 via INTRAVENOUS

## 2019-04-25 MED ORDER — ALTEPLASE 2 MG IJ SOLR: 2.0000 mg | Freq: Once | INTRAMUSCULAR | Status: DC | PRN

## 2019-04-25 MED ORDER — CEFAZOLIN SODIUM-DEXTROSE 2-4 GM/100ML-% IV SOLN
2.0000 g | INTRAVENOUS | Status: AC
  Administered 2019-04-26: 10:00:00 2 g via INTRAVENOUS
  Filled 2019-04-25 (×2): qty 100

## 2019-04-25 MED ORDER — PENTAFLUOROPROP-TETRAFLUOROETH EX AERO: 1.0000 "application " | INHALATION_SPRAY | CUTANEOUS | Status: DC | PRN

## 2019-04-25 MED ORDER — CEFAZOLIN SODIUM-DEXTROSE 1-4 GM/50ML-% IV SOLN: 1.0000 g | INTRAVENOUS | Status: DC

## 2019-04-25 MED ORDER — HEPARIN SODIUM (PORCINE) 1000 UNIT/ML DIALYSIS
1000.0000 [IU] | INTRAMUSCULAR | Status: DC | PRN
  Filled 2019-04-25: qty 1

## 2019-04-25 MED ORDER — CHLORHEXIDINE GLUCONATE CLOTH 2 % EX PADS
6.0000 | MEDICATED_PAD | Freq: Every day | CUTANEOUS | Status: DC
  Administered 2019-04-26 – 2019-04-27 (×2): 6 via TOPICAL

## 2019-04-25 MED ORDER — LIDOCAINE-PRILOCAINE 2.5-2.5 % EX CREA: 1.0000 "application " | TOPICAL_CREAM | CUTANEOUS | Status: DC | PRN

## 2019-04-25 MED ORDER — SODIUM CHLORIDE 0.9 % IV SOLN: 100.0000 mL | INTRAVENOUS | Status: DC | PRN

## 2019-04-25 MED ORDER — FUROSEMIDE 10 MG/ML IJ SOLN
60.0000 mg | Freq: Once | INTRAMUSCULAR | Status: AC
  Administered 2019-04-25: 60 mg via INTRAVENOUS
  Filled 2019-04-25: qty 6

## 2019-04-25 MED ORDER — LIDOCAINE HCL (PF) 1 % IJ SOLN: 5.0000 mL | INTRAMUSCULAR | Status: DC | PRN

## 2019-04-25 NOTE — Progress Notes (Signed)
Patient stated he does not want to wear CPAP tonight. RT informed patient to have RN call if he changes his mind. RT will monitor as needed.

## 2019-04-25 NOTE — Anesthesia Preprocedure Evaluation (Addendum)
Anesthesia Evaluation  Patient identified by MRN, date of birth, ID band Patient awake    Reviewed: Allergy & Precautions, NPO status , Patient's Chart, lab work & pertinent test results  Airway Mallampati: II  TM Distance: >3 FB     Dental  (+) Dental Advisory Given   Pulmonary shortness of breath, Current Smoker,    breath sounds clear to auscultation       Cardiovascular hypertension, Pt. on medications + CAD, + Cardiac Stents and +CHF  + dysrhythmias  Rhythm:Regular Rate:Normal     Neuro/Psych negative neurological ROS     GI/Hepatic Neg liver ROS, GERD  ,  Endo/Other  diabetes, Type 2  Renal/GU CRFRenal disease     Musculoskeletal   Abdominal   Peds  Hematology  (+) anemia ,   Anesthesia Other Findings   Reproductive/Obstetrics                            Lab Results  Component Value Date   WBC 8.1 04/25/2019   HGB 7.9 (L) 04/25/2019   HCT 23.7 (L) 04/25/2019   MCV 90.5 04/25/2019   PLT 174 04/25/2019   Lab Results  Component Value Date   CREATININE 6.19 (H) 04/25/2019   BUN 74 (H) 04/25/2019   NA 136 04/25/2019   K 4.8 04/25/2019   CL 105 04/25/2019   CO2 19 (L) 04/25/2019    Anesthesia Physical Anesthesia Plan  ASA: IV  Anesthesia Plan: General   Post-op Pain Management:    Induction: Intravenous  PONV Risk Score and Plan: 1 and Dexamethasone, Ondansetron and Treatment may vary due to age or medical condition  Airway Management Planned: LMA  Additional Equipment:   Intra-op Plan:   Post-operative Plan: Extubation in OR  Informed Consent: I have reviewed the patients History and Physical, chart, labs and discussed the procedure including the risks, benefits and alternatives for the proposed anesthesia with the patient or authorized representative who has indicated his/her understanding and acceptance.     Dental advisory given  Plan Discussed with:  CRNA  Anesthesia Plan Comments:        Anesthesia Quick Evaluation

## 2019-04-25 NOTE — Progress Notes (Signed)
VASCULAR LAB PRELIMINARY  PRELIMINARY  PRELIMINARY  PRELIMINARY  Upper extremity vein mapping completed.    Preliminary report:  See CV proc for preliminary results.   Kilea Mccarey, RVT 04/25/2019, 1:23 PM

## 2019-04-25 NOTE — Progress Notes (Signed)
PROGRESS NOTE    [name]  IHK:[id number] DOB: Jun 23, 1941 DOA: 04/19/2019 PCP: No primary care provider on file.   Brief Narrative:  The patient [name] is a 78 year old African-American obese male with a past medical history significant for but limited to hypertension, prostate cancer, CKD stage V, coronary artery disease, type 2 diabetes mellitus and treatment and was gout, history of acute DVT currently on Coumadin and other comorbidities who presents to the emergency room with a history of gradual progressive shortness of breath for about a week.  According to the patient for the last 1 to 2 weeks he become more more progressively short of breath and gradually worsening symptoms with occasional wheezing no cough or fever and this was more so at nighttime without any chest pain.  He was worked up and found to have a low EF and a nuclear stress test was then done which showed moderate sized area of anterior lateral wall ischemia at the mid and basal level with apical thinning.  There is also diffuse hypokinesis worse in the apex and he was transferred from [facility] to [facility] for cardiac catheterization which showed medical disease and mentation was for medical management for LAD and PAD disease.  Cardiology is involved in the recommending continue aspirin, carvedilol, atorvastatin.  Patient CKD stage V worsened and nephrology is following and recommending initiation of dialysis and patient is to undergo vein mapping with placement of temporary dialysis catheter soon and initiation of dialysis on Monday.  Vein mapping was not done until today and vascular surgery was consulted for access placement.  IV Lasix was repeated again today.  Patient to likely dialyze tomorrow   Assessment & Plan:   Principal Problem:   Shortness of breath Active Problems:   Coronary artery disease   Hypertension   Chronic kidney disease   Chest pain   Acute combined systolic and diastolic heart failure  (HCC)   CKD (chronic kidney disease) stage 5, GFR less than 15 ml/min (HCC)   NSVT (nonsustained ventricular tachycardia) (HCC)   LBBB (left bundle branch block)   Class 2 severe obesity due to excess calories with serious comorbidity and body mass index (BMI) of 38.0 to 38.9 in adult ([place])   Anemia   End stage renal disease (HCC)  Dyspnea on exertion likely secondary to Chronic systolic CHF along with Cardiomyopathy, slightly improved -Patient given a dose of IV diuresis at 60 mg once per nephrology and this is to be repeated today -Work-up for his cardiomyopathy and cardiology feels his cardiomyopathy is out of proportion to his CAD as patient underwent a cardiac catheterization yesterday.  -Cardiology recommending continue aspirin, carvedilol and statin -Nephrology recommending initiation of dialysis and patient is going for vein mapping -Further care per Nephrology and Cardiology -We will continue DuoNeb every 2 hours PRN as well as isosorbide-hydralazine 1 tab p.o. 3 times daily for heart failure along with nitroglycerin -Repeat chest x-ray this morning showed "The mediastinal contour is normal. The heart size is enlarged. Mild hazy opacity of bilateral lung bases are identified. There is no pulmonary edema or pleural effusion."  Acute on chronic systolic CHF -As above given diuresis; given another dose today -Continue with isosorbide hydralazine as well as carvedilol -Strict I's and O's and daily weights; patient is -5.443 L since admission and is down 5 pounds since admission -Further volume management to be initiated once he is started on dialysis  CKD stage V Hyperphosphatemia in the setting of worsening renal dysfunction Metabolic  acidosis in the setting of CKD -In the setting of likely hypertension diabetes -Patient's BUN/creatinine today was slightly worse 74/6.19 -Nephrology feels that there is no emergent indication for hemodialysis today and have left his n.p.o.  status -They are planning on initiating hemodialysis on day 3 after tunneled catheters placed and they are going to obtain vein-today -Is given diuresis with IV Lasix once for his nephrology again today for acute systolic CHF -Further care per nephrology -Continue with serum bicarbonate 650 mg p.o. daily continue with calcitriol 0.25 mg p.o. every Monday Wednesday Friday as well as cholecalciferol 1000 units p.o. daily -Patient was also given a dose of Feraheme -Nephrology consulted vascular surgery and vein mapping was to be done today. Vascular surgery will see the patient for permanent access -Vascular surgery evaluated and discussed options with the patient understands agrees to proceed and the duplex shows Small surface veins bilaterally no image with SonoSite ultrasound at the time of surgery but suspect require Gore-Tex graft and they will place a tunneled catheter in the left arm access for tomorrow  History of CAD with PCI to LAD in 2012 -Continue with aspirin, statin, and carvedilol -Continue with nitroglycerin 0.4 mg sublingually q. 5 minutes as needed chest pain -Cardiac catheterization as below  Diabetes mellitus type 2 -At home is diet controlled -Last hemoglobin A1c was 5.6 -Continue with sensitive NovoLog/scale before meals and at bedtime -CBGs have been ranging from 85-118  Anemia chronic kidney disease in the setting of stage V CKD -Patient's hemoglobin/hematocrit was 7.9/23.7 today -Patient got a dose of Feraheme by nephrology yesterday -Continue monitor for signs and symptoms of bleeding; currently no overt bleeding noted -Repeat CBC in a.m.  OSA -Continue CPAP  History of DVT -Had an infrapopliteal DVT in September 2019 and has had 10 months of anticoagulation and cardiology recommends no longer taking warfarin now as he has had 10 months of treatment  Thrombocytopenia, improved -Patient's platelet count is now 174,000 -We will continue monitor for signs of  bleeding -Repeat CBC in a.m.  Hypertension -Blood pressures been borderline-continue with carvedilol as well as isosorbide-hydralazine  Hyperlipidemia -continue Statin  Obesity -Estimated body mass index is 32.43 kg/m as calculated from the following:   Height as of this encounter: 5\' 8"  (1.727 m).   Weight as of this encounter: 96.8 kg. -Weight Loss and Dietary Counseling given   DVT prophylaxis: Heparin 5000 units subcu every 8 now that he is no longer on warfarin Code Status: FULL CODE  Family Communication: No family present at bedside  Disposition Plan: Remain Inpatient for Initiation of Dialysis on Monday   Consultants:   Cardiology  Nephrology  Vascular surgery  Procedures:  ECHOCARDIOGRAM 04/20/2019 IMPRESSIONS    1. The left ventricle has moderate-severely reduced systolic function, with an ejection fraction of 30-35%. The cavity size was mildly dilated. There is moderately increased left ventricular wall thickness. Left ventricular diastolic Doppler parameters  are indeterminate. There is incoordinate septal motion. Left ventricular diffuse hypokinesis.  2. Mildly thickened tricuspid valve leaflets.  3. The mitral valve is abnormal. Moderate thickening of the mitral valve leaflet.  4. Moderately sized vegetation on the tricuspid valve.  5. The tricuspid valve is abnormal.  6. The aortic valve is tricuspid. No stenosis of the aortic valve.  7. The aorta is normal in size and structure.  8. The inferior vena cava was normal in size with <50% respiratory variability.  SUMMARY   LVEF 30-35%, severe global hypokinesis with regional variation, moderate LVH,  mildly reduced RV systolic function, mobile 0.5 x 1.0 cm hyperechoic mass noted on the anterior leaflet of the tricuspid valve, seen prolapsing into the right atrium (consider thrombus, tumor or less likely vegetation), mitral valve thickening with mild regurgitation. Consider TEE to further evaluate the  tricuspid valve.  FINDINGS  Left Ventricle: The left ventricle has moderate-severely reduced systolic function, with an ejection fraction of 30-35%. The cavity size was mildly dilated. There is moderately increased left ventricular wall thickness. Left ventricular diastolic  Doppler parameters are indeterminate. There is incoordinate septal motion. Left ventricular diffuse hypokinesis.  Right Ventricle: The right ventricle has mildly reduced systolic function. The cavity was normal. There is no increase in right ventricular wall thickness.  Left Atrium: Left atrial size was normal in size.  Right Atrium: Right atrial size was normal in size. Right atrial pressure is estimated at 10 mmHg.  Interatrial Septum: No atrial level shunt detected by color flow Doppler.  Pericardium: There is no evidence of pericardial effusion.  Mitral Valve: The mitral valve is abnormal. Moderate thickening of the mitral valve leaflet. Mitral valve regurgitation is mild by color flow Doppler.  Tricuspid Valve: The tricuspid valve is abnormal. Tricuspid valve regurgitation is mild by color flow Doppler. The tricuspid valve is mildly thickened. There is a moderately sized mobile tricuspid valve vegetation on the anterior leaflet. The TV  vegetation measures 5 mm x 10 mm.  Aortic Valve: The aortic valve is tricuspid Aortic valve regurgitation was not visualized by color flow Doppler. There is No stenosis of the aortic valve.  Pulmonic Valve: The pulmonic valve was grossly normal. Pulmonic valve regurgitation is not visualized by color flow Doppler.  Aorta: The aorta is normal in size and structure.  Venous: The inferior vena cava is normal in size with less than 50% respiratory variability.    +--------------+--------++ LEFT VENTRICLE         +--------------+--------++ PLAX 2D                +--------------+--------++ LVIDd:        6.10 cm  +--------------+--------++ LVIDs:         5.20 cm  +--------------+--------++ LV PW:        1.40 cm  +--------------+--------++ LV IVS:       1.40 cm  +--------------+--------++ LVOT diam:    2.10 cm  +--------------+--------++ LV SV:        57 ml    +--------------+--------++ LV SV Index:  25.77    +--------------+--------++ LVOT Area:    3.46 cm +--------------+--------++                        +--------------+--------++  +---------------+-------++-----------++ LEFT ATRIUM           Index       +---------------+-------++-----------++ LA diam:       4.50 cm2.11 cm/m  +---------------+-------++-----------++ LA Vol (A2C):  61.1 ml28.66 ml/m +---------------+-------++-----------++ LA Vol (A4C):  34.1 ml15.99 ml/m +---------------+-------++-----------++ LA Biplane Vol:46.9 ml22.00 ml/m +---------------+-------++-----------++ +------------+---------+++ RIGHT ATRIUM          +------------+---------+++ RA Pressure:8.00 mmHg +------------+---------+++  +------------+-----------++ AORTIC VALVE            +------------+-----------++ LVOT Vmax:  95.40 cm/s  +------------+-----------++ LVOT Vmean: 56.000 cm/s +------------+-----------++ LVOT VTI:   0.162 m     +------------+-----------++   +-------------+-------++ AORTA                +-------------+-------++ Ao Root diam:3.50 cm +-------------+-------++  +--------------+----------++  +---------------+---------++  MITRAL VALVE              TRICUSPID VALVE          +--------------+----------++  +---------------+---------++ MV Area (PHT):4.80 cm    Estimated RAP: 8.00 mmHg +--------------+----------++  +---------------+---------++ MV PHT:       45.82 msec +--------------+----------++  +--------------+-------+ MV Decel Time:158 msec    SHUNTS                +--------------+----------++   +--------------+-------+ +--------------+-----------++ Systemic VTI: 0.16 m  MV E velocity:114.00 cm/s +--------------+-------+ +--------------+-----------++ Systemic Diam:2.10 cm                               +--------------+-------+  NM STRESS TEST 04/21/2019 There was no ST segment deviation noted during stress.  No T wave inversion was noted during stress.  Findings consistent with ischemia.  This is a high risk study.  The left ventricular ejection fraction is moderately decreased (30-44%).   Moderate sized area of anterolateral wall ischemia at mid and basal level with apical thinning Diffuse hypokinesis worse in the apex EF 31%  CARDIAC CATHETERIZATION 04/23/2019 1. Mild RCA stenosis with severe stenosis in the mid/distal PDA (appropriate for medical therapy with small rea of myocardium supplied) 2. Widely patent left main 3. Diffusely diseased LAD with moderate proximal stenosis and moderate in-stent restenosis in the mid-vessel 4. Widely patent left circumflex 5. Moderately elevated LVEDP  Recommend: medical therapy for CAD and CHF. Cardiomyopathy out of proportion to extent of CAD  VEIN MAPPING, Preliminary Limitations: IV in left arm, tissue disturbance in bilateral forearms and right              AC make it difficult to see anatomy  Comparison Study: No prior study on file for comparison  Performing Technologist: Sharion Dove RVS    Examination Guidelines: A complete evaluation includes B-mode imaging, spectral Doppler, color Doppler, and power Doppler as needed of all accessible portions of each vessel. Bilateral testing is considered an integral part of a complete examination. Limited examinations for reoccurring indications may be performed as noted.  +-----------------+-------------+----------+--------------+ Right Cephalic   Diameter (cm)Depth (cm)   Findings    +-----------------+-------------+----------+--------------+ Prox upper  arm                          not visualized +-----------------+-------------+----------+--------------+ Mid upper arm        0.21        2.60                  +-----------------+-------------+----------+--------------+ Dist upper arm       0.27        1.98                  +-----------------+-------------+----------+--------------+ Antecubital fossa    0.26        0.14                  +-----------------+-------------+----------+--------------+ Prox forearm         0.20        1.53                  +-----------------+-------------+----------+--------------+ Mid forearm                             not visualized +-----------------+-------------+----------+--------------+ Wrist  not visualized +-----------------+-------------+----------+--------------+  +-----------------+-------------+----------+--------------+ Right Basilic    Diameter (cm)Depth (cm)   Findings    +-----------------+-------------+----------+--------------+ Prox upper arm       0.39        0.78                  +-----------------+-------------+----------+--------------+ Mid upper arm        0.24        0.70                  +-----------------+-------------+----------+--------------+ Dist upper arm                          not visualized +-----------------+-------------+----------+--------------+ Antecubital fossa                       not visualized +-----------------+-------------+----------+--------------+ Prox forearm         0.35        2.57                  +-----------------+-------------+----------+--------------+ Mid forearm          0.23        1.85                  +-----------------+-------------+----------+--------------+ Wrist                0.16        1.40                   +-----------------+-------------+----------+--------------+  +-----------------+-------------+----------+--------------+ Left Cephalic    Diameter (cm)Depth (cm)   Findings    +-----------------+-------------+----------+--------------+ Prox upper arm                          not visualized +-----------------+-------------+----------+--------------+ Mid upper arm        0.18        2.11                  +-----------------+-------------+----------+--------------+ Dist upper arm       0.20        1.93     branching    +-----------------+-------------+----------+--------------+ Antecubital fossa    0.21        1.50                  +-----------------+-------------+----------+--------------+ Prox forearm                            not visualized +-----------------+-------------+----------+--------------+ Mid forearm                             not visualized +-----------------+-------------+----------+--------------+ Wrist                                   not visualized +-----------------+-------------+----------+--------------+  +-----------------+-------------+----------+--------------+ Left Basilic     Diameter (cm)Depth (cm)   Findings    +-----------------+-------------+----------+--------------+ Prox upper arm                          not visualized +-----------------+-------------+----------+--------------+ Mid upper arm                           not visualized +-----------------+-------------+----------+--------------+ Dist upper  arm                          not visualized +-----------------+-------------+----------+--------------+ Antecubital fossa                       not visualized +-----------------+-------------+----------+--------------+ Prox forearm                            not visualized +-----------------+-------------+----------+--------------+ Mid forearm                             not  visualized +-----------------+-------------+----------+--------------+ Wrist                                   not visualized +-----------------+-------------+----------+--------------+  *See table(s) above for measurements and observations.   Antimicrobials:  Anti-infectives (From admission, onward)   Start     Dose/Rate Route Frequency Ordered Stop   04/26/19 0900  ceFAZolin (ANCEF) IVPB 2g/100 mL premix    Note to Pharmacy: Send with pt to OR   2 g 200 mL/hr over 30 Minutes Intravenous To [facility] 04/25/19 1502 04/27/19 0900   04/26/19 0800  ceFAZolin (ANCEF) IVPB 1 g/50 mL premix  Status:  Discontinued    Note to Pharmacy: Send with pt to OR   1 g 100 mL/hr over 30 Minutes Intravenous On call 04/25/19 1342 04/25/19 1502     Subjective: Seen and examined at bedside and states that he is doing okay today and states his breathing is a little bit better since Lasix.  Not as swollen today.  No nausea or vomiting.  Denies chest pain, answer dizziness.  No other concerns at this time and understands that he will be going for vein mapping and dialysis likely in the morning.  Objective: Vitals:   04/24/19 2105 04/24/19 2325 04/25/19 0610 04/25/19 1442  BP: (!) 138/96  (!) 142/74 130/78  Pulse:  73  61  Resp:  (!) 22    Temp: 97.8 F (36.6 C)  97.8 F (36.6 C) 97.7 F (36.5 C)  TempSrc: Oral  Oral Oral  SpO2: 100% 100%  97%  Weight:   96.8 kg   Height:        Intake/Output Summary (Last 24 hours) at 04/25/2019 1617 Last data filed at 04/25/2019 1000 Gross per 24 hour  Intake 123 ml  Output 1350 ml  Net -1227 ml   Filed Weights   04/23/19 0551 04/24/19 0419 04/25/19 0610  Weight: 97.1 kg 97.2 kg 96.8 kg   Examination: Physical Exam:  Constitutional: Well-nourished, well-developed obese African-American male currently no acute distress and appears calm Eyes: Lids and conjunctive are normal for sclera anicteric ENMT: External ears nose appear normal.   Grossly normal hearing. As much Neck: Appears supple no JVD Respiratory: Slight diminished auscultation bilaterally with no appreciable wheezing, rales, rhonchi.  Patient has no accessory muscle usage and is unlabored breathing Cardiovascular: Regular rate and rhythm.  Has very little edema noted today in the legs as it is improved Abdomen: Soft, nontender, distended second body habitus.  Bowel sounds present GU: Deferred Musculoskeletal: No contractures or cyanosis.  No joint deformities in the upper lower extremities Skin: No appreciable rashes or lesions on limited skin evaluation Neurologic: Cranial nerves II through XII grossly intact no appreciable focal  deficits Psychiatric: Has a normal mood and affect.  Intact judgment and insight.  Patient is awake, alert, oriented.   Data Reviewed: I have personally reviewed following labs and imaging studies  CBC: Recent Labs  Lab 04/19/19 1346 04/20/19 0446 04/23/19 0649 04/23/19 1452 04/25/19 0513  WBC 7.2 8.3 7.8 6.2 8.1  NEUTROABS  --   --   --   --  4.5  HGB 8.8* 9.7* 8.5* 8.5* 7.9*  HCT 27.0* 30.3* 26.4* 25.6* 23.7*  MCV 93.1 94.1 94.0 90.8 90.5  PLT 146* 162 150 131* 371   Basic Metabolic Panel: Recent Labs  Lab 04/21/19 1419 04/22/19 0615 04/23/19 0649 04/23/19 1452 04/24/19 0416 04/25/19 0513  NA 137 138 136  --  136 136  K 4.8 4.5 4.6  --  4.9 4.8  CL 109 108 105  --  108 105  CO2 17* 20* 20*  --  19* 19*  GLUCOSE 152* 92 81  --  95 79  BUN 58* 59* 70*  --  70* 74*  CREATININE 5.54* 5.56* 6.42* 6.31* 6.09* 6.19*  CALCIUM 8.6* 8.9 8.9  --  8.7* 8.9  MG  --   --  1.6*  --   --  2.1  PHOS  --  4.8*  --   --  5.4* 5.2*   GFR: Estimated Creatinine Clearance: 11.5 mL/min (A) (by C-G formula based on SCr of 6.19 mg/dL (H)). Liver Function Tests: Recent Labs  Lab 04/24/19 0416 04/25/19 0513  AST  --  19  ALT  --  11  ALKPHOS  --  47  BILITOT  --  0.6  PROT  --  7.5  ALBUMIN 3.1* 3.4*   No results for  input(s): LIPASE, AMYLASE in the last 168 hours. No results for input(s): AMMONIA in the last 168 hours. Coagulation Profile: Recent Labs  Lab 04/21/19 0720 04/22/19 0615 04/23/19 0649 04/24/19 0416 04/25/19 0513  INR 3.1* 2.2* 1.4* 1.4* 1.2   Cardiac Enzymes: No results for input(s): CKTOTAL, CKMB, CKMBINDEX, TROPONINI in the last 168 hours. BNP (last 3 results) No results for input(s): PROBNP in the last 8760 hours. HbA1C: No results for input(s): HGBA1C in the last 72 hours. CBG: Recent Labs  Lab 04/24/19 1221 04/24/19 1550 04/24/19 2106 04/25/19 0722 04/25/19 1129  GLUCAP 118* 111* 103* 85 86   Lipid Profile: No results for input(s): CHOL, HDL, LDLCALC, TRIG, CHOLHDL, LDLDIRECT in the last 72 hours. Thyroid Function Tests: No results for input(s): TSH, T4TOTAL, FREET4, T3FREE, THYROIDAB in the last 72 hours. Anemia Panel: No results for input(s): VITAMINB12, FOLATE, FERRITIN, TIBC, IRON, RETICCTPCT in the last 72 hours. Sepsis Labs: No results for input(s): PROCALCITON, LATICACIDVEN in the last 168 hours.  Recent Results (from the past 240 hour(s))  SARS Coronavirus 2 (CEPHEID - Performed in [place] hospital lab), Hosp Order     Status: None   Collection Time: 04/19/19  2:08 PM   Specimen: Nasopharyngeal Swab  Result Value Ref Range Status   SARS Coronavirus 2 NEGATIVE NEGATIVE Final    Comment: (NOTE) If result is NEGATIVE SARS-CoV-2 target nucleic acids are NOT DETECTED. The SARS-CoV-2 RNA is generally detectable in upper and lower  respiratory specimens during the acute phase of infection. The lowest  concentration of SARS-CoV-2 viral copies this assay can detect is 250  copies / mL. A negative result does not preclude SARS-CoV-2 infection  and should not be used as the sole basis for treatment or other  patient  management decisions.  A negative result may occur with  improper specimen collection / handling, submission of specimen other  than  nasopharyngeal swab, presence of viral mutation(s) within the  areas targeted by this assay, and inadequate number of viral copies  (<250 copies / mL). A negative result must be combined with clinical  observations, patient history, and epidemiological information. If result is POSITIVE SARS-CoV-2 target nucleic acids are DETECTED. The SARS-CoV-2 RNA is generally detectable in upper and lower  respiratory specimens dur ing the acute phase of infection.  Positive  results are indicative of active infection with SARS-CoV-2.  Clinical  correlation with patient history and other diagnostic information is  necessary to determine patient infection status.  Positive results do  not rule out bacterial infection or co-infection with other viruses. If result is PRESUMPTIVE POSTIVE SARS-CoV-2 nucleic acids MAY BE PRESENT.   A presumptive positive result was obtained on the submitted specimen  and confirmed on repeat testing.  While 2019 novel coronavirus  (SARS-CoV-2) nucleic acids may be present in the submitted sample  additional confirmatory testing may be necessary for epidemiological  and / or clinical management purposes  to differentiate between  SARS-CoV-2 and other Sarbecovirus currently known to infect humans.  If clinically indicated additional testing with an alternate test  methodology [phone]) is advised. The SARS-CoV-2 RNA is generally  detectable in upper and lower respiratory sp ecimens during the acute  phase of infection. The expected result is Negative. Fact Sheet for Patients:  StrictlyIdeas.no Fact Sheet for Healthcare Providers: BankingDealers.co.za This test is not yet approved or cleared by the [place] FDA and has been authorized for detection and/or diagnosis of SARS-CoV-2 by FDA under an Emergency Use Authorization (EUA).  This EUA will remain in effect (meaning this test can be used) for the duration of  the COVID-19 declaration under Section 564(b)(1) of the Act, 21 U.S.C. section 360bbb-3(b)(1), unless the authorization is terminated or revoked sooner. Performed at [facility], [place] [address], [place], [place] [address]     Radiology Studies: Dg Chest Port 1 View  Result Date: 04/25/2019 CLINICAL DATA:  Shortness of breath EXAM: PORTABLE CHEST 1 VIEW COMPARISON:  April 19, 2019 FINDINGS: The mediastinal contour is normal. The heart size is enlarged. Mild hazy opacity of bilateral lung bases are identified. There is no pulmonary edema or pleural effusion. IMPRESSION: Mild hazy opacity of bilateral lungs. This is nonspecific. Viral pneumonia is not excluded. Electronically Signed   By: Abelardo Diesel M.D.   On: 04/25/2019 10:53   Vas [place] Upper Ext Vein Mapping (pre-op Avf)  Result Date: 04/25/2019 UPPER EXTREMITY VEIN MAPPING  Indications: Pre-dialysis access. Limitations: IV in left arm, tissue disturbance in bilateral forearms and right              AC make it difficult to see anatomy Comparison Study: No prior study on file for comparison Performing Technologist: Sharion Dove RVS  Examination Guidelines: A complete evaluation includes B-mode imaging, spectral Doppler, color Doppler, and power Doppler as needed of all accessible portions of each vessel. Bilateral testing is considered an integral part of a complete examination. Limited examinations for reoccurring indications may be performed as noted. +-----------------+-------------+----------+--------------+ Right Cephalic   Diameter (cm)Depth (cm)   Findings    +-----------------+-------------+----------+--------------+ Prox upper arm                          not visualized +-----------------+-------------+----------+--------------+ Mid upper arm  0.21        2.60                  +-----------------+-------------+----------+--------------+ Dist upper arm       0.27        1.98                   +-----------------+-------------+----------+--------------+ Antecubital fossa    0.26        0.14                  +-----------------+-------------+----------+--------------+ Prox forearm         0.20        1.53                  +-----------------+-------------+----------+--------------+ Mid forearm                             not visualized +-----------------+-------------+----------+--------------+ Wrist                                   not visualized +-----------------+-------------+----------+--------------+ +-----------------+-------------+----------+--------------+ Right Basilic    Diameter (cm)Depth (cm)   Findings    +-----------------+-------------+----------+--------------+ Prox upper arm       0.39        0.78                  +-----------------+-------------+----------+--------------+ Mid upper arm        0.24        0.70                  +-----------------+-------------+----------+--------------+ Dist upper arm                          not visualized +-----------------+-------------+----------+--------------+ Antecubital fossa                       not visualized +-----------------+-------------+----------+--------------+ Prox forearm         0.35        2.57                  +-----------------+-------------+----------+--------------+ Mid forearm          0.23        1.85                  +-----------------+-------------+----------+--------------+ Wrist                0.16        1.40                  +-----------------+-------------+----------+--------------+ +-----------------+-------------+----------+--------------+ Left Cephalic    Diameter (cm)Depth (cm)   Findings    +-----------------+-------------+----------+--------------+ Prox upper arm                          not visualized +-----------------+-------------+----------+--------------+ Mid upper arm        0.18        2.11                   +-----------------+-------------+----------+--------------+ Dist upper arm       0.20        1.93     branching    +-----------------+-------------+----------+--------------+ Antecubital fossa    0.21        1.50                  +-----------------+-------------+----------+--------------+  Prox forearm                            not visualized +-----------------+-------------+----------+--------------+ Mid forearm                             not visualized +-----------------+-------------+----------+--------------+ Wrist                                   not visualized +-----------------+-------------+----------+--------------+ +-----------------+-------------+----------+--------------+ Left Basilic     Diameter (cm)Depth (cm)   Findings    +-----------------+-------------+----------+--------------+ Prox upper arm                          not visualized +-----------------+-------------+----------+--------------+ Mid upper arm                           not visualized +-----------------+-------------+----------+--------------+ Dist upper arm                          not visualized +-----------------+-------------+----------+--------------+ Antecubital fossa                       not visualized +-----------------+-------------+----------+--------------+ Prox forearm                            not visualized +-----------------+-------------+----------+--------------+ Mid forearm                             not visualized +-----------------+-------------+----------+--------------+ Wrist                                   not visualized +-----------------+-------------+----------+--------------+ *See table(s) above for measurements and observations.   Diagnosing physician:    Preliminary     Scheduled Meds: . allopurinol  100 mg Oral BID  . aspirin EC  81 mg Oral Daily  . atorvastatin  40 mg Oral q1800  . [START ON 04/26/2019] calcitRIOL  0.25 mcg Oral Q  M,W,F-HD  . carvedilol  6.25 mg Oral BID WC  . [START ON 04/26/2019] Chlorhexidine Gluconate Cloth  6 each Topical Q0600  . cholecalciferol  1,000 Units Oral Daily  . docusate sodium  100 mg Oral Daily  . gabapentin  100 mg Oral QHS  . heparin  5,000 Units Subcutaneous Q8H  . insulin aspart  0-5 Units Subcutaneous QHS  . insulin aspart  0-9 Units Subcutaneous TID WC  . isosorbide-hydrALAZINE  1 tablet Oral TID  . mouth rinse  15 mL Mouth Rinse BID  . pantoprazole  40 mg Oral Daily  . sodium bicarbonate  650 mg Oral BID  . sodium chloride flush  3 mL Intravenous Q12H  . terazosin  5 mg Oral q morning - 10a   Continuous Infusions: . [START ON 04/26/2019]  ceFAZolin (ANCEF) IV       LOS: 4 days   Kerney Elbe, DO [facility] PAGER is on [place]  If 7PM-7AM, please contact night-coverage [email or web address] Password [facility] 04/25/2019, 4:17 PM

## 2019-04-25 NOTE — Progress Notes (Signed)
[facility] Progress Note  Name: [name] MRN: [id number] DOB: Oct 02, 1940  Chief Complaint:  Shortness of breath   Subjective:  Had 1.6 liters UOP over 8/1 with the lasix.  He feels ok today.  He is ok with proceeding with plan for starting HD tomorrow after tunneled catheter placement and permanent vascular access placement.  Has an appt for driver's license renewal on 8/5 and would like to be out of the hospital by then to make it if possible.    Review of systems:  Appetite ok Denies shortness of breath  Denies chest pain No n/v  -------------------- Background on consult:  [name] is a 78 y.o. 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  He has also noticed swelling in his feet.  Cardiology is seeing him for acute systolic CHF with EF of 95-63%.  He states prior heart stent placement in 2012.  They are considering a cath and they note that the dye load could likely ultimately put the patient on dialysis.  Creatinine trends as below: [facility].  The patient does not have a fistula in place.  He states that he has had vein mapping for a fistula but hasn't had a fistula placed.  He states that he was told that his veins may be too small for a fistula.  He thinks his recent kidney function is "10%".  He last saw nephrology last year in December 2019 and last had labs in December.  Dr. Gae Dry ?   He is not sure of the cause of his CKD but does have HTN and DM; he is now off of insulin as he passed out with hypoglycemia twice.  He states he does have changes to his eyes from diabetes.  He is not on lasix at home.  Breathing has gotten better which he attributes to inhalers but he still feels swollen.  He provides the number of [phone] for the  [place].  not normally on iron or ESA.  "I've been talking about dialysis for a year now".   He has been seeing Dr. [name] Banker at the [facility].     Intake/Output Summary (Last 24 hours) at 04/25/2019 0824 Last data filed at 04/25/2019 0612 Gross per 24 hour  Intake 963 ml  Output 1600 ml  Net -637 ml    Vitals:  Vitals:   04/24/19 1817 04/24/19 2105 04/24/19 2325 04/25/19 0610  BP: (!) 155/96 (!) 138/96  (!) 142/74  Pulse: 73  73   Resp:   (!) 22   Temp:  97.8 F (36.6 C)  97.8 F (36.6 C)  TempSrc:  Oral  Oral  SpO2:  100% 100%   Weight:    96.8 kg  Height:         Physical Exam:   General adult male in bed  HEENT NCAT EOMI sclera anicteric Neck supple trachea midline Lungs increased work of breathing with position changes but unlabored at rest; clear to auscultation bilaterally Heart RRR; no rub Abdomen soft nontender obese habitus  Extremities trace edema lower extremities Psych normal mood and affect  Medications reviewed   Labs:  BMP Latest Ref Rng & Units 04/25/2019 04/24/2019 04/23/2019  Glucose 70 - 99 mg/dL 79 95 -  BUN 8 - 23 mg/dL 74(H) 70(H) -  Creatinine 0.61 - 1.24 mg/dL 6.19(H) 6.09(H) 6.31(H)  Sodium 135 - 145 mmol/L 136 136 -  Potassium 3.5 - 5.1 mmol/L 4.8 4.9 -  Chloride 98 - 111 mmol/L 105 108 -  CO2 22 - 32 mmol/L 19(L) 19(L) -  Calcium 8.9 - 10.3 mg/dL 8.9 8.7(L) -   Heart cath:  04/23/19 1. Mild RCA stenosis with severe stenosis in the mid/distal PDA (appropriate for medical therapy with small rea of myocardium supplied) 2. Widely patent left main 3. Diffusely diseased LAD with moderate proximal stenosis and moderate in-stent restenosis in the mid-vessel 4. Widely patent left circumflex 5. Moderately elevated LVEDP    Assessment/Plan:   # CKD stage V - Patient unsure of etiology - likely secondary to HTN and DM - No emergent indication for HD today - Plan for initiating HD on 8/3 after tunneled catheter placement with vascular.  Spoke with vascular  surgery and appreciate their assistance.  They will also place permanent access.   - Obtaining vein mapping  - He would like to do dialysis in [place] - lives off of [address]  - states he has an appt for driver's license renewal on 8/5 and would like to make this if possible - I let him know we would need to set up HD and go from there  - He would like more information about home dialysis and we discussed today; asked that he also follow up with educator at his outpatient unit  # Acute systolic CHF  - s/p heart cath with medical therapy recommended  - Repeat Lasix IV once now  # CAD - s/p heart cath with medical therapy recommended   # HTN with CKD - Acceptable control  # Metabolic acidosis  - 2/2 CKD  - supplemental bicarbonate 650 mg BID until starts HD     # Anemia 2/2 CKD  - Secondary in part to CKD likely  - normocytic - s/p Feraheme x 1  - Defer ESA - prostate cancer (followed by Dr. 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

## 2019-04-25 NOTE — H&P (View-Only) (Signed)
Hospital Consult    Reason for Consult:  Needs dialysis access Requesting Physician:  Royce Macadamia MRN #:  [id number]  History of Present Illness: This is a 78 y.o. male with hx of CKD stage V who has been followed at the [place].   He has had progressive SOB for about a week prior to admission.  He has had occasional wheezing but no cough or fever.  He denies leg swelling.   He denies chest pain.  He has hx of HTN, prostate cancer, CAD, diabetes, DVT, gout.  He has chronic systolic CHF/cardiomyopathy with LVEF of 30-35%.    [facility] is consulted for permanent dialysis access and tunneled catheter placement.    The pt is on a statin for cholesterol management.  The pt is on a daily aspirin.   Other AC:  Coumadin (hx DVT) The pt is on BB  for hypertension The pt is diabetic.   Tobacco hx:  current  Past Medical History:  Diagnosis Date  . Chronic kidney disease    renal insufficiency  . Coronary artery disease   . Diabetes mellitus    type 2-non-insulin  . GERD (gastroesophageal reflux disease)   . Gout   . Hypertension   . Obesity   . Prostate cancer ([place])   . Tobacco abuse counseling     Past Surgical History:  Procedure Laterality Date  . CARDIAC CATHETERIZATION  02/08/02  . CARDIAC CATHETERIZATION  11/20/10   drug eluting stent;mid left LAD, There is mild hypokinesis of the anterolateral wall. LVEF  is estimated at 50-55%  . INSERTION OF BRONCHIAL STENT     went in groin to R chest  . LEFT HEART CATH AND CORONARY ANGIOGRAPHY N/A 04/23/2019   Procedure: LEFT HEART CATH AND CORONARY ANGIOGRAPHY;  Surgeon: Sherren Mocha, MD;  Location: [facility];  Service: Cardiovascular;  Laterality: N/A;  . PENILE PROSTHESIS IMPLANT     '05    No Known Allergies  Prior to Admission medications   Medication Sig Start Date End Date Taking? Authorizing Provider  allopurinol (ZYLOPRIM) 100 MG tablet Take 100 mg by mouth 2 (two) times daily.    Yes [provider]  amLODipine  (NORVASC) 5 MG tablet Take 5 mg by mouth daily.   Yes [provider]  aspirin EC 81 MG tablet Take 81 mg by mouth daily.   Yes [provider]  atorvastatin (LIPITOR) 40 MG tablet Take 40 mg by mouth at bedtime.   Yes [provider]  calcitRIOL (ROCALTROL) 0.25 MCG capsule Take 0.5 mcg by mouth daily.   Yes [provider]  cholecalciferol (VITAMIN D) 1000 UNITS tablet Take 1,000 Units by mouth daily.   Yes [provider]  docusate sodium (COLACE) 100 MG capsule Take 100 mg by mouth daily.   Yes [provider]  ferrous gluconate (FERGON) 324 MG tablet Take 324 mg by mouth every Monday, Wednesday, and Friday.   Yes [provider]  gabapentin (NEURONTIN) 400 MG capsule Take 400 mg by mouth at bedtime.   Yes [provider]  losartan (COZAAR) 100 MG tablet Take 100 mg by mouth daily.   Yes [provider]  nitroGLYCERIN (NITROSTAT) 0.4 MG SL tablet Place 0.4 mg under the tongue every 5 (five) minutes as needed for chest pain. For chest pain    Yes [provider]  omeprazole (PRILOSEC) 20 MG capsule Take 20 mg by mouth daily.   Yes [provider]  terazosin (HYTRIN) 5  MG capsule Take 5 mg by mouth every morning.   Yes [provider]  warfarin (COUMADIN) 5 MG tablet Take 2 tablets (10 mg total) by mouth daily. Patient taking differently: Take 5-7.5 mg by mouth See admin instructions. Take 1.5 tablets (7.5 mg) on (Monday, Wednesday, Friday, Saturday, and Sunday) & Take 1 tablet (5 mg) on Tuesday and Thursday 06/21/18  Yes Carlisle Cater, PA-C    Social History   Socioeconomic History  . Marital status: Married    Spouse name: Not on file  . Number of children: 3  . Years of education: Not on file  . Highest education level: Not on file  Occupational History  . Occupation: retired  Scientific laboratory technician  . Financial resource strain: Not on file  . Food insecurity    Worry: Not on file     Inability: Not on file  . Transportation needs    Medical: Not on file    Non-medical: Not on file  Tobacco Use  . Smoking status: Current Every Day Smoker    Packs/day: 0.50    Years: 50.00    Pack years: 25.00    Types: Cigarettes  . Smokeless tobacco: Never Used  Substance and Sexual Activity  . Alcohol use: Not Currently    Comment: drank 5 beers 03-28-17  . Drug use: Not Currently    Types: Cocaine, Marijuana  . Sexual activity: Yes  Lifestyle  . Physical activity    Days per week: Not on file    Minutes per session: Not on file  . Stress: Not on file  Relationships  . Social Herbalist on phone: Not on file    Gets together: Not on file    Attends religious service: Not on file    Active member of club or organization: Not on file    Attends meetings of clubs or organizations: Not on file    Relationship status: Not on file  . Intimate partner violence    Fear of current or ex partner: Not on file    Emotionally abused: Not on file    Physically abused: Not on file    Forced sexual activity: Not on file  Other Topics Concern  . Not on file  Social History Narrative  . Not on file     Family History  Problem Relation Age of Onset  . Aneurysm Mother   . Multiple myeloma Mother   . Aneurysm Father   . Cancer Brother   . Prostate cancer Brother   . Cancer Other   . Prostate cancer Brother   . Bone cancer Brother   . Breast cancer Neg Hx   . Colon cancer Neg Hx   . Pancreatic cancer Neg Hx     ROS: [x]  Positive   [ ]  Negative   [ ]  All sytems reviewed and are negative  Cardiac: []  chest pain/pressure [x]  hx of CAD  [x]  DOE  Vascular: [x]  hx of DVT []  swelling in legs  Pulmonary: []  productive cough []  asthma/wheezing []  home O2  Neurologic: []  hx of CVA []  mini stroke  Hematologic: [x]  hx of cancer-hx of prostate cancer [x]  bleeding problems on coumadin for DVT  Endocrine:   [x]  diabetes []  thyroid disease  GI [x]   GERD  GU: [x]  CKD/renal failure []  HD--[]  M/W/F or []  T/T/S  Psychiatric: []  anxiety []  depression  Musculoskeletal: [x]  gout  Integumentary: []  rashes []  ulcers  Constitutional: []  fever []  chills  Physical Examination  Vitals:   04/24/19 2325 04/25/19 0610  BP:  (!) 142/74  Pulse: 73   Resp: (!) 22   Temp:  97.8 F (36.6 C)  SpO2: 100%    Body mass index is 32.43 kg/m.  General:  WDWN in NAD Gait: Not observed HENT: WNL, normocephalic Pulmonary: normal non-labored breathing Cardiac: regular Skin: without rashes Vascular Exam/Pulses:  Right Left  Radial 2+ (normal) 2+ (normal)   Extremities: IV present left forearm Musculoskeletal: no muscle wasting or atrophy  Neurologic: A&O X 3;  No focal weakness or paresthesias are detected; speech is fluent/normal Psychiatric:  The pt has Normal affect.  CBC    Component Value Date/Time   WBC 8.1 04/25/2019 0513   RBC 2.62 (L) 04/25/2019 0513   HGB 7.9 (L) 04/25/2019 0513   HCT 23.7 (L) 04/25/2019 0513   PLT 174 04/25/2019 0513   MCV 90.5 04/25/2019 0513   MCH 30.2 04/25/2019 0513   MCHC 33.3 04/25/2019 0513   RDW 14.9 04/25/2019 0513   LYMPHSABS 2.4 04/25/2019 0513   MONOABS 0.7 04/25/2019 0513   EOSABS 0.4 04/25/2019 0513   BASOSABS 0.0 04/25/2019 0513    BMET    Component Value Date/Time   NA 136 04/25/2019 0513   K 4.8 04/25/2019 0513   CL 105 04/25/2019 0513   CO2 19 (L) 04/25/2019 0513   GLUCOSE 79 04/25/2019 0513   BUN 74 (H) 04/25/2019 0513   CREATININE 6.19 (H) 04/25/2019 0513   CREATININE 3.07 (HH) 07/03/2018 1515   CALCIUM 8.9 04/25/2019 0513   GFRNONAA 8 (L) 04/25/2019 0513   GFRNONAA 18 (L) 07/03/2018 1515   GFRAA 9 (L) 04/25/2019 0513   GFRAA 21 (L) 07/03/2018 1515    COAGS: Lab Results  Component Value Date   INR 1.2 04/25/2019   INR 1.4 (H) 04/24/2019   INR 1.4 (H) 04/23/2019     Non-Invasive Vascular Imaging:   Right Cephalic   Diameter (cm)Depth (cm)   Findings     +-----------------+-------------+----------+--------------+ Prox upper arm                          not visualized +-----------------+-------------+----------+--------------+ Mid upper arm        0.21        2.60                  +-----------------+-------------+----------+--------------+ Dist upper arm       0.27        1.98                  +-----------------+-------------+----------+--------------+ Antecubital fossa    0.26        0.14                  +-----------------+-------------+----------+--------------+ Prox forearm         0.20        1.53                  +-----------------+-------------+----------+--------------+ Mid forearm                             not visualized +-----------------+-------------+----------+--------------+ Wrist                                   not visualized +-----------------+-------------+----------+--------------+  +-----------------+-------------+----------+--------------+ Right Basilic    Diameter (cm)Depth (  cm)   Findings    +-----------------+-------------+----------+--------------+ Prox upper arm       0.39        0.78                  +-----------------+-------------+----------+--------------+ Mid upper arm        0.24        0.70                  +-----------------+-------------+----------+--------------+ Dist upper arm                          not visualized +-----------------+-------------+----------+--------------+ Antecubital fossa                       not visualized +-----------------+-------------+----------+--------------+ Prox forearm         0.35        2.57                  +-----------------+-------------+----------+--------------+ Mid forearm          0.23        1.85                  +-----------------+-------------+----------+--------------+ Wrist                0.16        1.40                   +-----------------+-------------+----------+--------------+  +-----------------+-------------+----------+--------------+ Left Cephalic    Diameter (cm)Depth (cm)   Findings    +-----------------+-------------+----------+--------------+ Prox upper arm                          not visualized +-----------------+-------------+----------+--------------+ Mid upper arm        0.18        2.11                  +-----------------+-------------+----------+--------------+ Dist upper arm       0.20        1.93     branching    +-----------------+-------------+----------+--------------+ Antecubital fossa    0.21        1.50                  +-----------------+-------------+----------+--------------+ Prox forearm                            not visualized +-----------------+-------------+----------+--------------+ Mid forearm                             not visualized +-----------------+-------------+----------+--------------+ Wrist                                   not visualized +-----------------+-------------+----------+--------------+  +-----------------+-------------+----------+--------------+ Left Basilic     Diameter (cm)Depth (cm)   Findings    +-----------------+-------------+----------+--------------+ Prox upper arm                          not visualized +-----------------+-------------+----------+--------------+ Mid upper arm                           not visualized +-----------------+-------------+----------+--------------+ Dist upper arm  not visualized +-----------------+-------------+----------+--------------+ Antecubital fossa                       not visualized +-----------------+-------------+----------+--------------+ Prox forearm                            not visualized +-----------------+-------------+----------+--------------+ Mid forearm                             not  visualized +-----------------+-------------+----------+--------------+ Wrist                                   not visualized +-----------------+-------------+----------+--------------+    ASSESSMENT/PLAN: This is a 78 y.o. male with progressive CKD in need of dialysis and placement of permanent access and tunnel dialysis catheter.    -pt is right hand dominant.  His veins are small on vein map.  Most likely will need left arm AV graft, but will check with u/s in the operating room.  Also placement of tunneled dialysis catheter.  -will have IV moved from left arm to right arm and restrict left arm. -npo after MN and consent ordered.   -pt is on coumadin for hx of DVT - INR today was 1.2.    Leontine Locket, PA-C [facility] [phone]  I have examined the patient, reviewed and agree with above.  Discussed options with the patient who understands and agrees to proceed.  Duplex shows extremely small surface veins bilaterally.  Will image with SonoSite ultrasound the time of surgery but suspect will require Gore-Tex graft.  Will place tunneled catheter and left arm access tomorrow  Curt Jews, MD 04/25/2019 1:48 PM

## 2019-04-25 NOTE — Progress Notes (Signed)
Progress Note  Patient Name: [name] Date of Encounter: 04/25/2019  Primary Cardiologist: [facility]   Subjective   Denies chest pain, palpitations, and shortness of breath  Inpatient Medications    Scheduled Meds: . allopurinol  100 mg Oral BID  . aspirin EC  81 mg Oral Daily  . atorvastatin  40 mg Oral q1800  . [START ON 04/26/2019] calcitRIOL  0.25 mcg Oral Q M,W,F-HD  . carvedilol  6.25 mg Oral BID WC  . cholecalciferol  1,000 Units Oral Daily  . docusate sodium  100 mg Oral Daily  . furosemide  60 mg Intravenous Once  . gabapentin  100 mg Oral QHS  . heparin  5,000 Units Subcutaneous Q8H  . insulin aspart  0-5 Units Subcutaneous QHS  . insulin aspart  0-9 Units Subcutaneous TID WC  . isosorbide-hydrALAZINE  1 tablet Oral TID  . mouth rinse  15 mL Mouth Rinse BID  . pantoprazole  40 mg Oral Daily  . sodium bicarbonate  650 mg Oral BID  . sodium chloride flush  3 mL Intravenous Q12H  . terazosin  5 mg Oral q morning - 10a   Continuous Infusions:  PRN Meds: acetaminophen, ipratropium-albuterol, nitroGLYCERIN, ondansetron (ZOFRAN) IV, oxyCODONE-acetaminophen, sodium chloride flush   Vital Signs    Vitals:   04/24/19 1817 04/24/19 2105 04/24/19 2325 04/25/19 0610  BP: (!) 155/96 (!) 138/96  (!) 142/74  Pulse: 73  73   Resp:   (!) 22   Temp:  97.8 F (36.6 C)  97.8 F (36.6 C)  TempSrc:  Oral  Oral  SpO2:  100% 100%   Weight:    96.8 kg  Height:        Intake/Output Summary (Last 24 hours) at 04/25/2019 0901 Last data filed at 04/25/2019 0612 Gross per 24 hour  Intake 483 ml  Output 1600 ml  Net -1117 ml   Filed Weights   04/23/19 0551 04/24/19 0419 04/25/19 0610  Weight: 97.1 kg 97.2 kg 96.8 kg    Telemetry    Sinus rhythm, PVCs, 5 beat run of nonsustained ventricular tachycardia yesterday evening at roughly 7 PM- Personally Reviewed   Physical Exam   GEN: No acute distress.   Neck: No JVD Cardiac: RRR, no murmurs, rubs, or gallops.   Respiratory: Clear to auscultation bilaterally. GI: Soft, nontender, non-distended  MS: No edema; No deformity. Neuro:  Nonfocal  Psych: Normal affect   Labs    Chemistry Recent Labs  Lab 04/23/19 0649 04/23/19 1452 04/24/19 0416 04/25/19 0513  NA 136  --  136 136  K 4.6  --  4.9 4.8  CL 105  --  108 105  CO2 20*  --  19* 19*  GLUCOSE 81  --  95 79  BUN 70*  --  70* 74*  CREATININE 6.42* 6.31* 6.09* 6.19*  CALCIUM 8.9  --  8.7* 8.9  PROT  --   --   --  7.5  ALBUMIN  --   --  3.1* 3.4*  AST  --   --   --  19  ALT  --   --   --  11  ALKPHOS  --   --   --  47  BILITOT  --   --   --  0.6  GFRNONAA 8* 8* 8* 8*  GFRAA 9* 9* 9* 9*  ANIONGAP 11  --  9 12     Hematology Recent Labs  Lab 04/23/19 [phone]  04/23/19 1452 04/25/19 0513  WBC 7.8 6.2 8.1  RBC 2.81* 2.82* 2.62*  HGB 8.5* 8.5* 7.9*  HCT 26.4* 25.6* 23.7*  MCV 94.0 90.8 90.5  MCH 30.2 30.1 30.2  MCHC 32.2 33.2 33.3  RDW 15.3 14.9 14.9  PLT 150 131* 174    Cardiac EnzymesNo results for input(s): TROPONINI in the last 168 hours. No results for input(s): TROPIPOC in the last 168 hours.   BNPNo results for input(s): BNP, PROBNP in the last 168 hours.   DDimer  Recent Labs  Lab 04/19/19 1408  DDIMER 0.81*     Radiology    No results found.  Cardiac Studies   ECHO07/28/2020 1. The left ventricle has moderate-severely reduced systolic function, with an ejection fraction of 30-35%. The cavity size was mildly dilated. There is moderately increased left ventricular wall thickness. Left ventricular diastolic Doppler parameters  are indeterminate. There is incoordinate septal motion. Left ventricular diffuse hypokinesis. 2. Mildly thickened tricuspid valve leaflets. 3. The mitral valve is abnormal. Moderate thickening of the mitral valve leaflet. 4. Moderately sized vegetation on the tricuspid valve. 5. The tricuspid valve is abnormal. 6. The aortic valve is tricuspid. No stenosis of the aortic  valve. 7. The aorta is normal in size and structure. 8. The inferior vena cava was normal in size with <50% respiratory variability.  Nuclear stress test 04/21/2019   There was no ST segment deviation noted during stress.  No T wave inversion was noted during stress.  Findings consistent with ischemia.  This is a high risk study.  The left ventricular ejection fraction is moderately decreased (30-44%).  Moderate sized area of anterolateral wall ischemia at mid and basal level with apical thinning Diffuse hypokinesis worse in the apex EF 31%   Cath 04/23/19:  1. Mild RCA stenosis with severe stenosis in the mid/distal PDA (appropriate for medical therapy with small rea of myocardium supplied) 2. Widely patent left main 3. Diffusely diseased LAD with moderate proximal stenosis and moderate in-stent restenosis in the mid-vessel 4. Widely patent left circumflex 5. Moderately elevated LVEDP  Recommend: medical therapy for CAD and CHF. Cardiomyopathy out of proportion to extent of CAD     Patient Profile     78 y.o. male with remote stent to LAD, new onset systolic HF, anterolateral ischemia, LBBB and stage V CKD approaching HD.  Assessment & Plan    1. CAD: Cath reviewed above. Medical management recommended for LAD and PDA disease. Denies anginal symptoms. 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 Continue ASA, carvedilol, and atorvastatin.  2. Chronic systolic CHF/cardiomyopathy: LVEF 30-35%. 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 LBBB present. Suboptimal candidate for CRT-D at present with need for HD. -637 cc overnight. Nephrology following and adjusting diuretics.  We will again get IV Lasix today. On carvedilol and Bidil.  Hemodialysis to start tomorrow.  3. CKD stage V: Nephrology following.  Hemodialysis to start tomorrow.  4. HTN: BP mildly elevated. No changes today.  5. Anemia of chronic disease: Hgb 7.9.  6. NSVT: Mg 1.6-->2.1 today. On  carvedilol.   For questions or updates, please contact [place] Please consult [email or web address] for contact info under Cardiology/STEMI.      Signed, Kate Sable, MD  04/25/2019, 9:01 AM

## 2019-04-25 NOTE — Consult Note (Addendum)
Hospital Consult    Reason for Consult:  Needs dialysis access Requesting Physician:  Royce Macadamia MRN #:  [id number]  History of Present Illness: This is a 78 y.o. male with hx of CKD stage V who has been followed at the [place].   He has had progressive SOB for about a week prior to admission.  He has had occasional wheezing but no cough or fever.  He denies leg swelling.   He denies chest pain.  He has hx of HTN, prostate cancer, CAD, diabetes, DVT, gout.  He has chronic systolic CHF/cardiomyopathy with LVEF of 30-35%.    [facility] is consulted for permanent dialysis access and tunneled catheter placement.    The pt is on a statin for cholesterol management.  The pt is on a daily aspirin.   Other AC:  Coumadin (hx DVT) The pt is on BB  for hypertension The pt is diabetic.   Tobacco hx:  current  Past Medical History:  Diagnosis Date  . Chronic kidney disease    renal insufficiency  . Coronary artery disease   . Diabetes mellitus    type 2-non-insulin  . GERD (gastroesophageal reflux disease)   . Gout   . Hypertension   . Obesity   . Prostate cancer ([place])   . Tobacco abuse counseling     Past Surgical History:  Procedure Laterality Date  . CARDIAC CATHETERIZATION  02/08/02  . CARDIAC CATHETERIZATION  11/20/10   drug eluting stent;mid left LAD, There is mild hypokinesis of the anterolateral wall. LVEF  is estimated at 50-55%  . INSERTION OF BRONCHIAL STENT     went in groin to R chest  . LEFT HEART CATH AND CORONARY ANGIOGRAPHY N/A 04/23/2019   Procedure: LEFT HEART CATH AND CORONARY ANGIOGRAPHY;  Surgeon: Sherren Mocha, MD;  Location: [facility];  Service: Cardiovascular;  Laterality: N/A;  . PENILE PROSTHESIS IMPLANT     '05    No Known Allergies  Prior to Admission medications   Medication Sig Start Date End Date Taking? Authorizing Provider  allopurinol (ZYLOPRIM) 100 MG tablet Take 100 mg by mouth 2 (two) times daily.    Yes [provider]  amLODipine  (NORVASC) 5 MG tablet Take 5 mg by mouth daily.   Yes [provider]  aspirin EC 81 MG tablet Take 81 mg by mouth daily.   Yes [provider]  atorvastatin (LIPITOR) 40 MG tablet Take 40 mg by mouth at bedtime.   Yes [provider]  calcitRIOL (ROCALTROL) 0.25 MCG capsule Take 0.5 mcg by mouth daily.   Yes [provider]  cholecalciferol (VITAMIN D) 1000 UNITS tablet Take 1,000 Units by mouth daily.   Yes [provider]  docusate sodium (COLACE) 100 MG capsule Take 100 mg by mouth daily.   Yes [provider]  ferrous gluconate (FERGON) 324 MG tablet Take 324 mg by mouth every Monday, Wednesday, and Friday.   Yes [provider]  gabapentin (NEURONTIN) 400 MG capsule Take 400 mg by mouth at bedtime.   Yes [provider]  losartan (COZAAR) 100 MG tablet Take 100 mg by mouth daily.   Yes [provider]  nitroGLYCERIN (NITROSTAT) 0.4 MG SL tablet Place 0.4 mg under the tongue every 5 (five) minutes as needed for chest pain. For chest pain    Yes [provider]  omeprazole (PRILOSEC) 20 MG capsule Take 20 mg by mouth daily.   Yes [provider]  terazosin (HYTRIN) 5  MG capsule Take 5 mg by mouth every morning.   Yes [provider]  warfarin (COUMADIN) 5 MG tablet Take 2 tablets (10 mg total) by mouth daily. Patient taking differently: Take 5-7.5 mg by mouth See admin instructions. Take 1.5 tablets (7.5 mg) on (Monday, Wednesday, Friday, Saturday, and Sunday) & Take 1 tablet (5 mg) on Tuesday and Thursday 06/21/18  Yes Carlisle Cater, PA-C    Social History   Socioeconomic History  . Marital status: Married    Spouse name: Not on file  . Number of children: 3  . Years of education: Not on file  . Highest education level: Not on file  Occupational History  . Occupation: retired  Scientific laboratory technician  . Financial resource strain: Not on file  . Food insecurity    Worry: Not on file     Inability: Not on file  . Transportation needs    Medical: Not on file    Non-medical: Not on file  Tobacco Use  . Smoking status: Current Every Day Smoker    Packs/day: 0.50    Years: 50.00    Pack years: 25.00    Types: Cigarettes  . Smokeless tobacco: Never Used  Substance and Sexual Activity  . Alcohol use: Not Currently    Comment: drank 5 beers 03-28-17  . Drug use: Not Currently    Types: Cocaine, Marijuana  . Sexual activity: Yes  Lifestyle  . Physical activity    Days per week: Not on file    Minutes per session: Not on file  . Stress: Not on file  Relationships  . Social Herbalist on phone: Not on file    Gets together: Not on file    Attends religious service: Not on file    Active member of club or organization: Not on file    Attends meetings of clubs or organizations: Not on file    Relationship status: Not on file  . Intimate partner violence    Fear of current or ex partner: Not on file    Emotionally abused: Not on file    Physically abused: Not on file    Forced sexual activity: Not on file  Other Topics Concern  . Not on file  Social History Narrative  . Not on file     Family History  Problem Relation Age of Onset  . Aneurysm Mother   . Multiple myeloma Mother   . Aneurysm Father   . Cancer Brother   . Prostate cancer Brother   . Cancer Other   . Prostate cancer Brother   . Bone cancer Brother   . Breast cancer Neg Hx   . Colon cancer Neg Hx   . Pancreatic cancer Neg Hx     ROS: [x]  Positive   [ ]  Negative   [ ]  All sytems reviewed and are negative  Cardiac: []  chest pain/pressure [x]  hx of CAD  [x]  DOE  Vascular: [x]  hx of DVT []  swelling in legs  Pulmonary: []  productive cough []  asthma/wheezing []  home O2  Neurologic: []  hx of CVA []  mini stroke  Hematologic: [x]  hx of cancer-hx of prostate cancer [x]  bleeding problems on coumadin for DVT  Endocrine:   [x]  diabetes []  thyroid disease  GI [x]   GERD  GU: [x]  CKD/renal failure []  HD--[]  M/W/F or []  T/T/S  Psychiatric: []  anxiety []  depression  Musculoskeletal: [x]  gout  Integumentary: []  rashes []  ulcers  Constitutional: []  fever []  chills  Physical Examination  Vitals:   04/24/19 2325 04/25/19 0610  BP:  (!) 142/74  Pulse: 73   Resp: (!) 22   Temp:  97.8 F (36.6 C)  SpO2: 100%    Body mass index is 32.43 kg/m.  General:  WDWN in NAD Gait: Not observed HENT: WNL, normocephalic Pulmonary: normal non-labored breathing Cardiac: regular Skin: without rashes Vascular Exam/Pulses:  Right Left  Radial 2+ (normal) 2+ (normal)   Extremities: IV present left forearm Musculoskeletal: no muscle wasting or atrophy  Neurologic: A&O X 3;  No focal weakness or paresthesias are detected; speech is fluent/normal Psychiatric:  The pt has Normal affect.  CBC    Component Value Date/Time   WBC 8.1 04/25/2019 0513   RBC 2.62 (L) 04/25/2019 0513   HGB 7.9 (L) 04/25/2019 0513   HCT 23.7 (L) 04/25/2019 0513   PLT 174 04/25/2019 0513   MCV 90.5 04/25/2019 0513   MCH 30.2 04/25/2019 0513   MCHC 33.3 04/25/2019 0513   RDW 14.9 04/25/2019 0513   LYMPHSABS 2.4 04/25/2019 0513   MONOABS 0.7 04/25/2019 0513   EOSABS 0.4 04/25/2019 0513   BASOSABS 0.0 04/25/2019 0513    BMET    Component Value Date/Time   NA 136 04/25/2019 0513   K 4.8 04/25/2019 0513   CL 105 04/25/2019 0513   CO2 19 (L) 04/25/2019 0513   GLUCOSE 79 04/25/2019 0513   BUN 74 (H) 04/25/2019 0513   CREATININE 6.19 (H) 04/25/2019 0513   CREATININE 3.07 (HH) 07/03/2018 1515   CALCIUM 8.9 04/25/2019 0513   GFRNONAA 8 (L) 04/25/2019 0513   GFRNONAA 18 (L) 07/03/2018 1515   GFRAA 9 (L) 04/25/2019 0513   GFRAA 21 (L) 07/03/2018 1515    COAGS: Lab Results  Component Value Date   INR 1.2 04/25/2019   INR 1.4 (H) 04/24/2019   INR 1.4 (H) 04/23/2019     Non-Invasive Vascular Imaging:   Right Cephalic   Diameter (cm)Depth (cm)   Findings     +-----------------+-------------+----------+--------------+ Prox upper arm                          not visualized +-----------------+-------------+----------+--------------+ Mid upper arm        0.21        2.60                  +-----------------+-------------+----------+--------------+ Dist upper arm       0.27        1.98                  +-----------------+-------------+----------+--------------+ Antecubital fossa    0.26        0.14                  +-----------------+-------------+----------+--------------+ Prox forearm         0.20        1.53                  +-----------------+-------------+----------+--------------+ Mid forearm                             not visualized +-----------------+-------------+----------+--------------+ Wrist                                   not visualized +-----------------+-------------+----------+--------------+  +-----------------+-------------+----------+--------------+ Right Basilic    Diameter (cm)Depth (  cm)   Findings    +-----------------+-------------+----------+--------------+ Prox upper arm       0.39        0.78                  +-----------------+-------------+----------+--------------+ Mid upper arm        0.24        0.70                  +-----------------+-------------+----------+--------------+ Dist upper arm                          not visualized +-----------------+-------------+----------+--------------+ Antecubital fossa                       not visualized +-----------------+-------------+----------+--------------+ Prox forearm         0.35        2.57                  +-----------------+-------------+----------+--------------+ Mid forearm          0.23        1.85                  +-----------------+-------------+----------+--------------+ Wrist                0.16        1.40                   +-----------------+-------------+----------+--------------+  +-----------------+-------------+----------+--------------+ Left Cephalic    Diameter (cm)Depth (cm)   Findings    +-----------------+-------------+----------+--------------+ Prox upper arm                          not visualized +-----------------+-------------+----------+--------------+ Mid upper arm        0.18        2.11                  +-----------------+-------------+----------+--------------+ Dist upper arm       0.20        1.93     branching    +-----------------+-------------+----------+--------------+ Antecubital fossa    0.21        1.50                  +-----------------+-------------+----------+--------------+ Prox forearm                            not visualized +-----------------+-------------+----------+--------------+ Mid forearm                             not visualized +-----------------+-------------+----------+--------------+ Wrist                                   not visualized +-----------------+-------------+----------+--------------+  +-----------------+-------------+----------+--------------+ Left Basilic     Diameter (cm)Depth (cm)   Findings    +-----------------+-------------+----------+--------------+ Prox upper arm                          not visualized +-----------------+-------------+----------+--------------+ Mid upper arm                           not visualized +-----------------+-------------+----------+--------------+ Dist upper arm  not visualized +-----------------+-------------+----------+--------------+ Antecubital fossa                       not visualized +-----------------+-------------+----------+--------------+ Prox forearm                            not visualized +-----------------+-------------+----------+--------------+ Mid forearm                             not  visualized +-----------------+-------------+----------+--------------+ Wrist                                   not visualized +-----------------+-------------+----------+--------------+    ASSESSMENT/PLAN: This is a 78 y.o. male with progressive CKD in need of dialysis and placement of permanent access and tunnel dialysis catheter.    -pt is right hand dominant.  His veins are small on vein map.  Most likely will need left arm AV graft, but will check with u/s in the operating room.  Also placement of tunneled dialysis catheter.  -will have IV moved from left arm to right arm and restrict left arm. -npo after MN and consent ordered.   -pt is on coumadin for hx of DVT - INR today was 1.2.    Leontine Locket, PA-C [facility] [phone]  I have examined the patient, reviewed and agree with above.  Discussed options with the patient who understands and agrees to proceed.  Duplex shows extremely small surface veins bilaterally.  Will image with SonoSite ultrasound the time of surgery but suspect will require Gore-Tex graft.  Will place tunneled catheter and left arm access tomorrow  Curt Jews, MD 04/25/2019 1:48 PM

## 2019-04-26 ENCOUNTER — Inpatient Hospital Stay (HOSPITAL_COMMUNITY): Payer: No Typology Code available for payment source | Admitting: Anesthesiology

## 2019-04-26 ENCOUNTER — Inpatient Hospital Stay (HOSPITAL_COMMUNITY): Payer: No Typology Code available for payment source

## 2019-04-26 ENCOUNTER — Encounter (HOSPITAL_COMMUNITY): Payer: Self-pay

## 2019-04-26 ENCOUNTER — Encounter (HOSPITAL_COMMUNITY)
Admission: EM | Disposition: A | Discharge status: Home / Self Care | Payer: Self-pay | Source: Home / Self Care | Attending: Internal Medicine

## 2019-04-26 DIAGNOSIS — N185 Chronic kidney disease, stage 5: Secondary | ICD-10-CM

## 2019-04-26 HISTORY — PX: INSERTION OF DIALYSIS CATHETER: SHX1324

## 2019-04-26 HISTORY — PX: AV FISTULA PLACEMENT: SHX1204

## 2019-04-26 LAB — SURGICAL PCR SCREEN
MRSA, PCR: NEGATIVE
Staphylococcus aureus: NEGATIVE

## 2019-04-26 LAB — CBC WITH DIFFERENTIAL/PLATELET
Abs Immature Granulocytes: 0.02 10*3/uL (ref 0.00–0.07)
Basophils Absolute: 0 10*3/uL (ref 0.0–0.1)
Basophils Relative: 0 %
Eosinophils Absolute: 0.3 10*3/uL (ref 0.0–0.5)
Eosinophils Relative: 4 %
HCT: 25.7 % — ABNORMAL LOW (ref 39.0–52.0)
Hemoglobin: 8.4 g/dL — ABNORMAL LOW (ref 13.0–17.0)
Immature Granulocytes: 0 %
Lymphocytes Relative: 28 %
Lymphs Abs: 2.3 10*3/uL (ref 0.7–4.0)
MCH: 30.1 pg (ref 26.0–34.0)
MCHC: 32.7 g/dL (ref 30.0–36.0)
MCV: 92.1 fL (ref 80.0–100.0)
Monocytes Absolute: 0.8 10*3/uL (ref 0.1–1.0)
Monocytes Relative: 10 %
Neutro Abs: 4.6 10*3/uL (ref 1.7–7.7)
Neutrophils Relative %: 58 %
Platelets: 167 10*3/uL (ref 150–400)
RBC: 2.79 MIL/uL — ABNORMAL LOW (ref 4.22–5.81)
RDW: 14.9 % (ref 11.5–15.5)
WBC: 8 10*3/uL (ref 4.0–10.5)
nRBC: 0 % (ref 0.0–0.2)

## 2019-04-26 LAB — GLUCOSE, CAPILLARY
Glucose-Capillary: 75 mg/dL (ref 70–99)
Glucose-Capillary: 91 mg/dL (ref 70–99)
Glucose-Capillary: 122 mg/dL — ABNORMAL HIGH (ref 70–99)
Glucose-Capillary: 230 mg/dL — ABNORMAL HIGH (ref 70–99)
Glucose-Capillary: 127 mg/dL — ABNORMAL HIGH (ref 70–99)

## 2019-04-26 LAB — COMPREHENSIVE METABOLIC PANEL
ALT: 11 U/L (ref 0–44)
AST: 15 U/L (ref 15–41)
Albumin: 3.3 g/dL — ABNORMAL LOW (ref 3.5–5.0)
Alkaline Phosphatase: 50 U/L (ref 38–126)
Anion gap: 12 (ref 5–15)
BUN: 78 mg/dL — ABNORMAL HIGH (ref 8–23)
CO2: 21 mmol/L — ABNORMAL LOW (ref 22–32)
Calcium: 8.7 mg/dL — ABNORMAL LOW (ref 8.9–10.3)
Chloride: 106 mmol/L (ref 98–111)
Creatinine, Ser: 6.98 mg/dL — ABNORMAL HIGH (ref 0.61–1.24)
GFR calc Af Amer: 8 mL/min — ABNORMAL LOW (ref >60)
GFR calc non Af Amer: 7 mL/min — ABNORMAL LOW (ref >60)
Glucose, Bld: 85 mg/dL (ref 70–99)
Potassium: 4.4 mmol/L (ref 3.5–5.1)
Sodium: 139 mmol/L (ref 135–145)
Total Bilirubin: 0.5 mg/dL (ref 0.3–1.2)
Total Protein: 7.8 g/dL (ref 6.5–8.1)

## 2019-04-26 LAB — MAGNESIUM: Magnesium: 2 mg/dL (ref 1.7–2.4)

## 2019-04-26 LAB — PROTIME-INR
INR: 1.2 (ref 0.8–1.2)
Prothrombin Time: 15.3 seconds — ABNORMAL HIGH (ref 11.4–15.2)

## 2019-04-26 LAB — PHOSPHORUS: Phosphorus: 5.4 mg/dL — ABNORMAL HIGH (ref 2.5–4.6)

## 2019-04-26 SURGERY — INSERTION OF DIALYSIS CATHETER
Anesthesia: General | Site: Chest | Laterality: Right

## 2019-04-26 MED ORDER — DEXAMETHASONE SODIUM PHOSPHATE 10 MG/ML IJ SOLN
INTRAMUSCULAR | Status: AC
  Filled 2019-04-26: qty 1

## 2019-04-26 MED ORDER — GABAPENTIN 400 MG PO CAPS
400.0000 mg | ORAL_CAPSULE | Freq: Every day | ORAL | Status: DC
  Administered 2019-04-26 – 2019-04-27 (×2): 400 mg via ORAL
  Filled 2019-04-26 (×2): qty 1

## 2019-04-26 MED ORDER — FENTANYL CITRATE (PF) 250 MCG/5ML IJ SOLN
INTRAMUSCULAR | Status: AC
  Filled 2019-04-26: qty 5

## 2019-04-26 MED ORDER — PROPOFOL 10 MG/ML IV BOLUS
INTRAVENOUS | Status: DC | PRN
  Administered 2019-04-26: 100 mg via INTRAVENOUS

## 2019-04-26 MED ORDER — DEXAMETHASONE SODIUM PHOSPHATE 10 MG/ML IJ SOLN
INTRAMUSCULAR | Status: DC | PRN
  Administered 2019-04-26: 5 mg via INTRAVENOUS

## 2019-04-26 MED ORDER — MUPIROCIN 2 % EX OINT: 1.0000 "application " | TOPICAL_OINTMENT | Freq: Two times a day (BID) | CUTANEOUS | Status: DC

## 2019-04-26 MED ORDER — LOSARTAN POTASSIUM 50 MG PO TABS
100.0000 mg | ORAL_TABLET | Freq: Every day | ORAL | Status: DC
  Administered 2019-04-27 – 2019-04-28 (×2): 100 mg via ORAL
  Filled 2019-04-26 (×2): qty 2

## 2019-04-26 MED ORDER — 0.9 % SODIUM CHLORIDE (POUR BTL) OPTIME
TOPICAL | Status: DC | PRN
  Administered 2019-04-26: 10:00:00 1000 mL

## 2019-04-26 MED ORDER — FERROUS GLUCONATE 324 (38 FE) MG PO TABS
324.0000 mg | ORAL_TABLET | ORAL | Status: DC
  Administered 2019-04-26 – 2019-04-28 (×2): 324 mg via ORAL
  Filled 2019-04-26 (×2): qty 1

## 2019-04-26 MED ORDER — HEPARIN SODIUM (PORCINE) 1000 UNIT/ML IJ SOLN
INTRAMUSCULAR | Status: DC | PRN
  Administered 2019-04-26: 10000 [IU] via INTRAVENOUS

## 2019-04-26 MED ORDER — SODIUM CHLORIDE 0.9 % IV SOLN
INTRAVENOUS | Status: DC | PRN
  Administered 2019-04-26: 500 mL

## 2019-04-26 MED ORDER — CALCITRIOL 0.5 MCG PO CAPS
0.5000 ug | ORAL_CAPSULE | Freq: Every day | ORAL | Status: DC
  Administered 2019-04-26 – 2019-04-28 (×3): 0.5 ug via ORAL
  Filled 2019-04-26 (×3): qty 1

## 2019-04-26 MED ORDER — SODIUM CHLORIDE 0.9 % IV SOLN
INTRAVENOUS | Status: DC
  Administered 2019-04-26: 08:00:00 via INTRAVENOUS

## 2019-04-26 MED ORDER — PROMETHAZINE HCL 25 MG/ML IJ SOLN: 6.2500 mg | INTRAMUSCULAR | Status: DC | PRN

## 2019-04-26 MED ORDER — ATORVASTATIN CALCIUM 40 MG PO TABS
40.0000 mg | ORAL_TABLET | Freq: Every day | ORAL | Status: DC
  Administered 2019-04-26 – 2019-04-27 (×2): 40 mg via ORAL
  Filled 2019-04-26 (×2): qty 1

## 2019-04-26 MED ORDER — LIDOCAINE-EPINEPHRINE 0.5 %-1:200000 IJ SOLN
INTRAMUSCULAR | Status: AC
  Filled 2019-04-26: qty 2

## 2019-04-26 MED ORDER — SODIUM CHLORIDE 0.9 % IV SOLN
INTRAVENOUS | Status: AC
  Filled 2019-04-26: qty 1.2

## 2019-04-26 MED ORDER — FENTANYL CITRATE (PF) 250 MCG/5ML IJ SOLN
INTRAMUSCULAR | Status: DC | PRN
  Administered 2019-04-26 (×4): 50 ug via INTRAVENOUS

## 2019-04-26 MED ORDER — LIDOCAINE-EPINEPHRINE 0.5 %-1:200000 IJ SOLN
INTRAMUSCULAR | Status: DC | PRN
  Administered 2019-04-26: 50 mL

## 2019-04-26 MED ORDER — EPHEDRINE SULFATE 50 MG/ML IJ SOLN
INTRAMUSCULAR | Status: DC | PRN
  Administered 2019-04-26 (×3): 10 mg via INTRAVENOUS
  Administered 2019-04-26: 5 mg via INTRAVENOUS

## 2019-04-26 MED ORDER — ONDANSETRON HCL 4 MG/2ML IJ SOLN
INTRAMUSCULAR | Status: AC
  Filled 2019-04-26: qty 2

## 2019-04-26 MED ORDER — SODIUM CHLORIDE 0.9 % IV SOLN
INTRAVENOUS | Status: DC | PRN
  Administered 2019-04-26: 10:00:00 25 ug/min via INTRAVENOUS

## 2019-04-26 MED ORDER — PHENYLEPHRINE 40 MCG/ML (10ML) SYRINGE FOR IV PUSH (FOR BLOOD PRESSURE SUPPORT)
PREFILLED_SYRINGE | INTRAVENOUS | Status: AC
  Filled 2019-04-26: qty 10

## 2019-04-26 MED ORDER — LIDOCAINE 2% (20 MG/ML) 5 ML SYRINGE
INTRAMUSCULAR | Status: DC | PRN
  Administered 2019-04-26: 60 mg via INTRAVENOUS

## 2019-04-26 MED ORDER — AMLODIPINE BESYLATE 5 MG PO TABS
5.0000 mg | ORAL_TABLET | Freq: Every day | ORAL | Status: DC
  Administered 2019-04-27 – 2019-04-28 (×2): 5 mg via ORAL
  Filled 2019-04-26 (×2): qty 1

## 2019-04-26 MED ORDER — PROPOFOL 10 MG/ML IV BOLUS
INTRAVENOUS | Status: AC
  Filled 2019-04-26: qty 20

## 2019-04-26 MED ORDER — FENTANYL CITRATE (PF) 100 MCG/2ML IJ SOLN: 25.0000 ug | INTRAMUSCULAR | Status: DC | PRN

## 2019-04-26 MED ORDER — EPHEDRINE 5 MG/ML INJ
INTRAVENOUS | Status: AC
  Filled 2019-04-26: qty 10

## 2019-04-26 MED ORDER — HEPARIN SODIUM (PORCINE) 1000 UNIT/ML IJ SOLN
INTRAMUSCULAR | Status: AC
  Filled 2019-04-26: qty 1

## 2019-04-26 SURGICAL SUPPLY — 62 items
ARMBAND PINK RESTRICT EXTREMIT (MISCELLANEOUS) ×5 IMPLANT
BAG DECANTER FOR FLEXI CONT (MISCELLANEOUS) ×3 IMPLANT
BIOPATCH RED 1 DISK 7.0 (GAUZE/BANDAGES/DRESSINGS) ×3 IMPLANT
BLADE SURG 15 STRL LF DISP TIS (BLADE) IMPLANT
BLADE SURG 15 STRL SS (BLADE) ×2
BLADE SURG ROTATE 9660 (MISCELLANEOUS) ×1 IMPLANT
CANISTER SUCT 3000ML PPV (MISCELLANEOUS) ×3 IMPLANT
CANNULA VESSEL 3MM 2 BLNT TIP (CANNULA) ×3 IMPLANT
CATH PALINDROME RT-P 15FX19CM (CATHETERS) IMPLANT
CATH PALINDROME RT-P 15FX23CM (CATHETERS) ×1 IMPLANT
CATH PALINDROME RT-P 15FX28CM (CATHETERS) IMPLANT
CATH PALINDROME RT-P 15FX55CM (CATHETERS) IMPLANT
CLIP LIGATING EXTRA MED SLVR (CLIP) ×3 IMPLANT
CLIP LIGATING EXTRA SM BLUE (MISCELLANEOUS) ×3 IMPLANT
COVER PROBE W GEL 5X96 (DRAPES) ×3 IMPLANT
COVER SURGICAL LIGHT HANDLE (MISCELLANEOUS) ×3 IMPLANT
COVER WAND RF STERILE (DRAPES) ×3 IMPLANT
DECANTER SPIKE VIAL GLASS SM (MISCELLANEOUS) ×3 IMPLANT
DERMABOND ADVANCED (GAUZE/BANDAGES/DRESSINGS) ×1
DERMABOND ADVANCED .7 DNX12 (GAUZE/BANDAGES/DRESSINGS) ×2 IMPLANT
DRAPE C-ARM 42X72 X-RAY (DRAPES) ×3 IMPLANT
DRAPE CHEST BREAST 15X10 FENES (DRAPES) ×3 IMPLANT
ELECT REM PT RETURN 9FT ADLT (ELECTROSURGICAL) ×3
ELECTRODE REM PT RTRN 9FT ADLT (ELECTROSURGICAL) ×2 IMPLANT
GAUZE 4X4 16PLY RFD (DISPOSABLE) ×3 IMPLANT
GAUZE SPONGE 4X4 12PLY STRL (GAUZE/BANDAGES/DRESSINGS) ×3 IMPLANT
GAUZE SPONGE 4X4 12PLY STRL LF (GAUZE/BANDAGES/DRESSINGS) ×1 IMPLANT
GLOVE BIO SURGEON STRL SZ 6.5 (GLOVE) ×4 IMPLANT
GLOVE SS BIOGEL STRL SZ 7.5 (GLOVE) ×2 IMPLANT
GLOVE SUPERSENSE BIOGEL SZ 7.5 (GLOVE) ×2
GOWN STRL REUS W/ TWL LRG LVL3 (GOWN DISPOSABLE) ×6 IMPLANT
GOWN STRL REUS W/TWL LRG LVL3 (GOWN DISPOSABLE) ×4
GRAFT GORETEX STRT 4-7X45 (Vascular Products) ×1 IMPLANT
KIT BASIN OR (CUSTOM PROCEDURE TRAY) ×3 IMPLANT
KIT TURNOVER KIT B (KITS) ×3 IMPLANT
NDL 18GX1X1/2 (RX/OR ONLY) (NEEDLE) ×2 IMPLANT
NDL HYPO 25GX1X1/2 BEV (NEEDLE) ×2 IMPLANT
NEEDLE 18GX1X1/2 (RX/OR ONLY) (NEEDLE) ×3 IMPLANT
NEEDLE 22X1 1/2 (OR ONLY) (NEEDLE) ×1 IMPLANT
NEEDLE HYPO 25GX1X1/2 BEV (NEEDLE) ×3 IMPLANT
NS IRRIG 1000ML POUR BTL (IV SOLUTION) ×3 IMPLANT
PACK CV ACCESS (CUSTOM PROCEDURE TRAY) ×3 IMPLANT
PACK SURGICAL SETUP 50X90 (CUSTOM PROCEDURE TRAY) ×3 IMPLANT
PAD ARMBOARD 7.5X6 YLW CONV (MISCELLANEOUS) ×6 IMPLANT
SOAP 2 % CHG 4 OZ (WOUND CARE) ×3 IMPLANT
SUT ETHILON 3 0 FSL (SUTURE) ×1 IMPLANT
SUT ETHILON 3 0 PS 1 (SUTURE) ×3 IMPLANT
SUT PROLENE 6 0 CC (SUTURE) ×6 IMPLANT
SUT SILK 2 0 PERMA HAND 18 BK (SUTURE) IMPLANT
SUT VIC AB 3-0 SH 27 (SUTURE) ×2
SUT VIC AB 3-0 SH 27X BRD (SUTURE) ×4 IMPLANT
SUT VICRYL 4-0 PS2 18IN ABS (SUTURE) ×3 IMPLANT
SYR 10ML LL (SYRINGE) ×3 IMPLANT
SYR 20ML LL LF (SYRINGE) ×3 IMPLANT
SYR 5ML LL (SYRINGE) ×6 IMPLANT
SYR CONTROL 10ML LL (SYRINGE) ×3 IMPLANT
SYR TOOMEY 50ML (SYRINGE) IMPLANT
TAPE CLOTH SURG 4X10 WHT LF (GAUZE/BANDAGES/DRESSINGS) ×1 IMPLANT
TOWEL GREEN STERILE (TOWEL DISPOSABLE) ×6 IMPLANT
TOWEL GREEN STERILE FF (TOWEL DISPOSABLE) ×3 IMPLANT
UNDERPAD 30X30 (UNDERPADS AND DIAPERS) ×3 IMPLANT
WATER STERILE IRR 1000ML POUR (IV SOLUTION) ×3 IMPLANT

## 2019-04-26 NOTE — Interval H&P Note (Signed)
History and Physical Interval Note:  04/26/2019 9:10 AM  [name]  has presented today for surgery, with the diagnosis of ESRD.  The various methods of treatment have been discussed with the patient and family. After consideration of risks, benefits and other options for treatment, the patient has consented to  Procedure(s): INSERTION OF HEMODIALYSIS CATHETER (N/A) ARTERIOVENOUS (AV) VERSUS GORE-TEX GRAFT ARM (Left) as a surgical intervention.  The patient's history has been reviewed, patient examined, no change in status, stable for surgery.  I have reviewed the patient's chart and labs.  Questions were answered to the patient's satisfaction.     Curt Jews

## 2019-04-26 NOTE — Transfer of Care (Signed)
Immediate Anesthesia Transfer of Care Note  Patient: [name]  Procedure(s) Performed: INSERTION OF HEMODIALYSIS CATHETER - using Palindrome Catheter size 23cm (Right Chest) INSERTION OF ARTERIOVENOUS (AV) GORE-TEX GRAFT ARM - using Gore Vascular Graft size 45cm (Left Arm Upper)  Patient Location: PACU  Anesthesia Type:General  Level of Consciousness: awake, drowsy and patient cooperative  Airway & Oxygen Therapy: Patient Spontanous Breathing  Post-op Assessment: Report given to RN and Post -op Vital signs reviewed and stable  Post vital signs: Reviewed and stable  Last Vitals:  Vitals Value Taken Time  BP 132/86 04/26/19 1155  Temp    Pulse 64 04/26/19 1158  Resp 18 04/26/19 1158  SpO2 95 % 04/26/19 1158  Vitals shown include unvalidated device data.  Last Pain:  Vitals:   04/26/19 0639  TempSrc: Oral  PainSc:       Patients Stated Pain Goal: 0 (46/28/63 [id number])  Complications: No apparent anesthesia complications

## 2019-04-26 NOTE — Progress Notes (Signed)
PROGRESS NOTE    [name]  PJK:[id number] DOB: [birth year] DOA: 04/19/2019 PCP: No primary care provider on file.   Brief Narrative:  The patient [name] is a 78 year old African-American obese male with a past medical history significant for but limited to hypertension, prostate cancer, CKD stage V, coronary artery disease, type 2 diabetes mellitus and treatment and was gout, history of acute DVT currently on Coumadin and other comorbidities who presents to the emergency room with a history of gradual progressive shortness of breath for about a week.  According to the patient for the last 1 to 2 weeks he become more more progressively short of breath and gradually worsening symptoms with occasional wheezing no cough or fever and this was more so at nighttime without any chest pain.  He was worked up and found to have a low EF and a nuclear stress test was then done which showed moderate sized area of anterior lateral wall ischemia at the mid and basal level with apical thinning.  There is also diffuse hypokinesis worse in the apex and he was transferred from [facility] to [facility] for cardiac catheterization which showed medical disease and mentation was for medical management for LAD and PAD disease.  Cardiology is involved in the recommending continue aspirin, carvedilol, atorvastatin.  Patient CKD stage V worsened and nephrology is following and recommending initiation of dialysis and patient is to undergo vein mapping with placement of temporary dialysis catheter soon and initiation of dialysis on Monday.  Vein mapping was not done until yesterday and vascular surgery was consulted for access placement.  IV Lasix was repeated again yesterday.  Patient to likely dialyze today as Temporary and Permanent access was placed today by Dr. Donnetta Hutching. Will need to be CLIP to Dialysis Center.   Assessment & Plan:   Principal Problem:   Shortness of breath Active Problems:   Coronary artery disease    Hypertension   Chronic kidney disease   Chest pain   Acute combined systolic and diastolic heart failure (HCC)   CKD (chronic kidney disease) stage 5, GFR less than 15 ml/min (HCC)   NSVT (nonsustained ventricular tachycardia) (HCC)   LBBB (left bundle branch block)   Class 2 severe obesity due to excess calories with serious comorbidity and body mass index (BMI) of 38.0 to 38.9 in adult (HCC)   Anemia   End stage renal disease (HCC)  Dyspnea on exertion likely secondary to Chronic systolic CHF along with Cardiomyopathy, slightly improved -Patient given a dose of IV diuresis at 60 mg once per nephrology and this is to be repeated today -Work-up for his cardiomyopathy and cardiology feels his cardiomyopathy is out of proportion to his CAD as patient underwent a cardiac catheterization yesterday.  -Cardiology recommending continue aspirin, carvedilol and statin -Nephrology recommending initiation of dialysis and patient is going for vein mapping -Further care per Nephrology and Cardiology -We will continue DuoNeb every 2 hours PRN as well as isosorbide-hydralazine 1 tab p.o. 3 times daily for heart failure along with nitroglycerin -Repeat chest x-ray this morning showed "Right jugular dialysis catheter has been placed. Catheter tip is at the junction of the SVC and right atrium. Enlargement of the cardiac silhouette without pulmonary edema. Negative for a pneumothorax. The trachea is midline." -Further volume maintenance to be initiated via dialysis through his [facility] and patient will need clipping to a dialysis center  Acute on chronic systolic CHF -Been given diuresis last 2 days but now admitted PRN IV per nephrology -Continue  with isosorbide hydralazine as well as carvedilol -Strict I's and O's and daily weights; patient is -5.888 L since admission and is down 6 pounds since admission -Further volume management to be initiated once he is started on dialysis  CKD stage V Hyperphosphatemia  in the setting of worsening renal dysfunction Metabolic acidosis in the setting of CKD -In the setting of likely hypertension diabetes -Patient's BUN/creatinine today was slightly worse 78/6.98 -Nephrology feels that there is no emergent indication for hemodialysis today and have left his n.p.o. status -They are planning on initiating hemodialysis on day 3 after tunneled catheters placed and they are going to obtain vein-today -Nephrology recommending IV Lasix as needed he will have volume maintenance with hemodialysis later today -Further care per nephrology -Continue with serum bicarbonate 650 mg p.o. daily continue with calcitriol 0.25 mg p.o. every Monday Wednesday Friday as well as cholecalciferol 1000 units p.o. daily -Patient was also given a dose of Feraheme -Nephrology consulted vascular surgery and vein mapping was to be done today. Vascular surgery will see the patient for permanent access -Vascular surgery evaluated and discussed options with the patient understands agrees to proceed and the duplex shows Small surface veins bilaterally no image with SonoSite ultrasound at the time of surgery but suspect require Gore-Tex graft and they will place a tunneled catheter in the left arm access for tomorrow -Patient is status post temporary dialysis catheter in the right IJ and a left forearm loop AV Gore-Tex graft and will be undergoing dialysis today  History of CAD with PCI to LAD in 2012 -Continue with aspirin, statin, and carvedilol -Continue with nitroglycerin 0.4 mg sublingually q. 5 minutes as needed chest pain -Cardiac catheterization as below  Diabetes Mellitus Type 2 -At home is diet controlled -Last hemoglobin A1c was 5.6 -Continue with sensitive NovoLog/scale before meals and at bedtime -CBGs have been ranging from 85-118  Anemia chronic kidney disease in the setting of stage V CKD -Patient's hemoglobin/hematocrit was 8.4/25.7 today -Patient got a dose of Feraheme by  nephrology -Continue with ferrous gluconate 324 mg p.o. every Monday Wednesday Friday -They are deferring his ESA given his prostate cancer which is followed by Dr. [name] -Continue monitor for signs and symptoms of bleeding; currently no overt bleeding noted -Repeat CBC in a.m.  OSA -Continue CPAP  History of DVT -Had an infrapopliteal DVT in September 2019 and has had 10 months of anticoagulation and cardiology recommends no longer taking warfarin now as he has had 10 months of treatment  Thrombocytopenia, improved -Patient's platelet count is now 167,000 -We will continue monitor for signs of bleeding -Repeat CBC in a.m.  Hypertension -Blood pressures been borderline -Continue with carvedilol as well as isosorbide-hydralazine -Also continue losartan 100 mg p.o. daily to be restarted  -BP this morning is 142/80  Hyperlipidemia -Continue atorvastatin 40 mg p.o. nightly  Obesity -Estimated body mass index is 32.28 kg/m as calculated from the following:   Height as of this encounter: 5\' 8"  (1.727 m).   Weight as of this encounter: 96.3 kg. -Weight Loss and Dietary Counseling given   History of prostate cancer -Continue and follow with Dr. Tammi Klippel in outpatient setting -Continue with terazosin 5 mg p.o. every morning  DVT prophylaxis: Heparin 5000 units subcu every 8 now that he is no longer on warfarin Code Status: FULL CODE  Family Communication: No family present at bedside  Disposition Plan: Remain Inpatient for Initiation of Dialysis on Monday   Consultants:   Cardiology  Nephrology  Vascular  surgery  Procedures:  ECHOCARDIOGRAM 04/20/2019 IMPRESSIONS    1. The left ventricle has moderate-severely reduced systolic function, with an ejection fraction of 30-35%. The cavity size was mildly dilated. There is moderately increased left ventricular wall thickness. Left ventricular diastolic Doppler parameters  are indeterminate. There is incoordinate septal motion.  Left ventricular diffuse hypokinesis.  2. Mildly thickened tricuspid valve leaflets.  3. The mitral valve is abnormal. Moderate thickening of the mitral valve leaflet.  4. Moderately sized vegetation on the tricuspid valve.  5. The tricuspid valve is abnormal.  6. The aortic valve is tricuspid. No stenosis of the aortic valve.  7. The aorta is normal in size and structure.  8. The inferior vena cava was normal in size with <50% respiratory variability.  SUMMARY   LVEF 30-35%, severe global hypokinesis with regional variation, moderate LVH, mildly reduced RV systolic function, mobile 0.5 x 1.0 cm hyperechoic mass noted on the anterior leaflet of the tricuspid valve, seen prolapsing into the right atrium (consider thrombus, tumor or less likely vegetation), mitral valve thickening with mild regurgitation. Consider TEE to further evaluate the tricuspid valve.  FINDINGS  Left Ventricle: The left ventricle has moderate-severely reduced systolic function, with an ejection fraction of 30-35%. The cavity size was mildly dilated. There is moderately increased left ventricular wall thickness. Left ventricular diastolic  Doppler parameters are indeterminate. There is incoordinate septal motion. Left ventricular diffuse hypokinesis.  Right Ventricle: The right ventricle has mildly reduced systolic function. The cavity was normal. There is no increase in right ventricular wall thickness.  Left Atrium: Left atrial size was normal in size.  Right Atrium: Right atrial size was normal in size. Right atrial pressure is estimated at 10 mmHg.  Interatrial Septum: No atrial level shunt detected by color flow Doppler.  Pericardium: There is no evidence of pericardial effusion.  Mitral Valve: The mitral valve is abnormal. Moderate thickening of the mitral valve leaflet. Mitral valve regurgitation is mild by color flow Doppler.  Tricuspid Valve: The tricuspid valve is abnormal. Tricuspid valve  regurgitation is mild by color flow Doppler. The tricuspid valve is mildly thickened. There is a moderately sized mobile tricuspid valve vegetation on the anterior leaflet. The TV  vegetation measures 5 mm x 10 mm.  Aortic Valve: The aortic valve is tricuspid Aortic valve regurgitation was not visualized by color flow Doppler. There is No stenosis of the aortic valve.  Pulmonic Valve: The pulmonic valve was grossly normal. Pulmonic valve regurgitation is not visualized by color flow Doppler.  Aorta: The aorta is normal in size and structure.  Venous: The inferior vena cava is normal in size with less than 50% respiratory variability.    +--------------+--------++ LEFT VENTRICLE         +--------------+--------++ PLAX 2D                +--------------+--------++ LVIDd:        6.10 cm  +--------------+--------++ LVIDs:        5.20 cm  +--------------+--------++ LV PW:        1.40 cm  +--------------+--------++ LV IVS:       1.40 cm  +--------------+--------++ LVOT diam:    2.10 cm  +--------------+--------++ LV SV:        57 ml    +--------------+--------++ LV SV Index:  25.77    +--------------+--------++ LVOT Area:    3.46 cm +--------------+--------++                        +--------------+--------++  +---------------+-------++-----------++  LEFT ATRIUM           Index       +---------------+-------++-----------++ LA diam:       4.50 cm2.11 cm/m  +---------------+-------++-----------++ LA Vol (A2C):  61.1 ml28.66 ml/m +---------------+-------++-----------++ LA Vol (A4C):  34.1 ml15.99 ml/m +---------------+-------++-----------++ LA Biplane Vol:46.9 ml22.00 ml/m +---------------+-------++-----------++ +------------+---------+++ RIGHT ATRIUM          +------------+---------+++ RA Pressure:8.00 mmHg +------------+---------+++  +------------+-----------++ AORTIC VALVE             +------------+-----------++ LVOT Vmax:  95.40 cm/s  +------------+-----------++ LVOT Vmean: 56.000 cm/s +------------+-----------++ LVOT VTI:   0.162 m     +------------+-----------++   +-------------+-------++ AORTA                +-------------+-------++ Ao Root diam:3.50 cm +-------------+-------++  +--------------+----------++  +---------------+---------++ MITRAL VALVE              TRICUSPID VALVE          +--------------+----------++  +---------------+---------++ MV Area (PHT):4.80 cm    Estimated RAP: 8.00 mmHg +--------------+----------++  +---------------+---------++ MV PHT:       45.82 msec +--------------+----------++  +--------------+-------+ MV Decel Time:158 msec    SHUNTS                +--------------+----------++  +--------------+-------+ +--------------+-----------++ Systemic VTI: 0.16 m  MV E velocity:114.00 cm/s +--------------+-------+ +--------------+-----------++ Systemic Diam:2.10 cm                               +--------------+-------+  NM STRESS TEST 04/21/2019 There was no ST segment deviation noted during stress.  No T wave inversion was noted during stress.  Findings consistent with ischemia.  This is a high risk study.  The left ventricular ejection fraction is moderately decreased (30-44%).   Moderate sized area of anterolateral wall ischemia at mid and basal level with apical thinning Diffuse hypokinesis worse in the apex EF 31%  CARDIAC CATHETERIZATION 04/23/2019 1. Mild RCA stenosis with severe stenosis in the mid/distal PDA (appropriate for medical therapy with small rea of myocardium supplied) 2. Widely patent left main 3. Diffusely diseased LAD with moderate proximal stenosis and moderate in-stent restenosis in the mid-vessel 4. Widely patent left circumflex 5. Moderately elevated LVEDP  Recommend: medical therapy for CAD and CHF. Cardiomyopathy out of proportion to  extent of CAD  VEIN MAPPING, Preliminary Limitations: IV in left arm, tissue disturbance in bilateral forearms and right              AC make it difficult to see anatomy  Comparison Study: No prior study on file for comparison  Performing Technologist: Sharion Dove RVS    Examination Guidelines: A complete evaluation includes B-mode imaging, spectral Doppler, color Doppler, and power Doppler as needed of all accessible portions of each vessel. Bilateral testing is considered an integral part of a complete examination. Limited examinations for reoccurring indications may be performed as noted.  +-----------------+-------------+----------+--------------+ Right Cephalic   Diameter (cm)Depth (cm)   Findings    +-----------------+-------------+----------+--------------+ Prox upper arm                          not visualized +-----------------+-------------+----------+--------------+ Mid upper arm        0.21        2.60                  +-----------------+-------------+----------+--------------+ Dist upper arm  0.27        1.98                  +-----------------+-------------+----------+--------------+ Antecubital fossa    0.26        0.14                  +-----------------+-------------+----------+--------------+ Prox forearm         0.20        1.53                  +-----------------+-------------+----------+--------------+ Mid forearm                             not visualized +-----------------+-------------+----------+--------------+ Wrist                                   not visualized +-----------------+-------------+----------+--------------+  +-----------------+-------------+----------+--------------+ Right Basilic    Diameter (cm)Depth (cm)   Findings    +-----------------+-------------+----------+--------------+ Prox upper arm       0.39        0.78                   +-----------------+-------------+----------+--------------+ Mid upper arm        0.24        0.70                  +-----------------+-------------+----------+--------------+ Dist upper arm                          not visualized +-----------------+-------------+----------+--------------+ Antecubital fossa                       not visualized +-----------------+-------------+----------+--------------+ Prox forearm         0.35        2.57                  +-----------------+-------------+----------+--------------+ Mid forearm          0.23        1.85                  +-----------------+-------------+----------+--------------+ Wrist                0.16        1.40                  +-----------------+-------------+----------+--------------+  +-----------------+-------------+----------+--------------+ Left Cephalic    Diameter (cm)Depth (cm)   Findings    +-----------------+-------------+----------+--------------+ Prox upper arm                          not visualized +-----------------+-------------+----------+--------------+ Mid upper arm        0.18        2.11                  +-----------------+-------------+----------+--------------+ Dist upper arm       0.20        1.93     branching    +-----------------+-------------+----------+--------------+ Antecubital fossa    0.21        1.50                  +-----------------+-------------+----------+--------------+ Prox forearm  not visualized +-----------------+-------------+----------+--------------+ Mid forearm                             not visualized +-----------------+-------------+----------+--------------+ Wrist                                   not visualized +-----------------+-------------+----------+--------------+  +-----------------+-------------+----------+--------------+ Left Basilic     Diameter (cm)Depth (cm)    Findings    +-----------------+-------------+----------+--------------+ Prox upper arm                          not visualized +-----------------+-------------+----------+--------------+ Mid upper arm                           not visualized +-----------------+-------------+----------+--------------+ Dist upper arm                          not visualized +-----------------+-------------+----------+--------------+ Antecubital fossa                       not visualized +-----------------+-------------+----------+--------------+ Prox forearm                            not visualized +-----------------+-------------+----------+--------------+ Mid forearm                             not visualized +-----------------+-------------+----------+--------------+ Wrist                                   not visualized +-----------------+-------------+----------+--------------+  *See table(s) above for measurements and observations.  Right IJ tunneled hemodialysis catheter and a left forearm loop AV Gore-Tex graft placed by Dr. Donnetta Hutching on 04/26/2019   Antimicrobials:  Anti-infectives (From admission, onward)   Start     Dose/Rate Route Frequency Ordered Stop   04/26/19 0900  ceFAZolin (ANCEF) IVPB 2g/100 mL premix    Note to Pharmacy: Send with pt to OR   2 g 200 mL/hr over 30 Minutes Intravenous To [facility] 04/25/19 1502 04/26/19 1010   04/26/19 0800  ceFAZolin (ANCEF) IVPB 1 g/50 mL premix  Status:  Discontinued    Note to Pharmacy: Send with pt to OR   1 g 100 mL/hr over 30 Minutes Intravenous On call 04/25/19 1342 04/25/19 1502     Subjective: Seen and examined at bedside and he had just come back from his surgical procedure and was a little "out of it".  Said he felt funny with the medications but was unable to describe what he felt.  Was surprised about the Gore-Tex graft in his left arm.  No chest pain, lightheadedness or dizziness.  Ready for  dialysis.  No other concerns or complaints at this time.  Objective: Vitals:   04/26/19 1230 04/26/19 1243 04/26/19 1245 04/26/19 1313  BP: 139/76  133/86 (!) 142/86  Pulse: 64 63 62 65  Resp: 16 20 17 18   Temp:   (!) 97.4 F (36.3 C)   TempSrc:      SpO2: 94% 97% 96% 98%  Weight:      Height:        Intake/Output Summary (Last 24 hours) at 04/26/2019 1522 Last  data filed at 04/26/2019 1303 Gross per 24 hour  Intake 130 ml  Output 575 ml  Net -445 ml   Filed Weights   04/24/19 0419 04/25/19 0610 04/26/19 0639  Weight: 97.2 kg 96.8 kg 96.3 kg   Examination: Physical Exam:  Constitutional: Well-nourished, well-developed obese African-American male currently no acute distress appears calm sitting in the bedside but states he "feels funny" after his Gore-Tex graft is awake alert and oriented Eyes: Lids and conjunctive are normal.  Sclera anicteric ENMT: External ears nose appear normal progress normal hearing Neck: Appears supple no JVD Respiratory: Mildly diminished auscultation bilaterally no appreciable wheezing, rales rhonchi.  Patient not tachypneic wheezing excess muscle breathe Cardiovascular: Regular rate and rhythm.  No appreciable murmurs, rubs or gallops.  Has 1+ lower extremity edema Abdomen: Soft, non-trichomonacide second body habitus.  Vessels present GU: Deferred Musculoskeletal: Has a right IJ TDC now left forearm loop Gore-Tex graft Skin: No appreciable rashes or lesions on to skin evaluation but has a left arm loop Gore-Tex graft Neurologic: Cranial nerves II through XII gross intact appreciable focal deficit Psychiatric: Has a normal mood and affect.  He is awake alert and oriented but states he feels "funny".  Data Reviewed: I have personally reviewed following labs and imaging studies  CBC: Recent Labs  Lab 04/20/19 0446 04/23/19 0649 04/23/19 1452 04/25/19 0513 04/26/19 0443  WBC 8.3 7.8 6.2 8.1 8.0  NEUTROABS  --   --   --  4.5 4.6  HGB 9.7* 8.5*  8.5* 7.9* 8.4*  HCT 30.3* 26.4* 25.6* 23.7* 25.7*  MCV 94.1 94.0 90.8 90.5 92.1  PLT 162 150 131* 174 852   Basic Metabolic Panel: Recent Labs  Lab 04/22/19 0615 04/23/19 0649 04/23/19 1452 04/24/19 0416 04/25/19 0513 04/26/19 0443  NA 138 136  --  136 136 139  K 4.5 4.6  --  4.9 4.8 4.4  CL 108 105  --  108 105 106  CO2 20* 20*  --  19* 19* 21*  GLUCOSE 92 81  --  95 79 85  BUN 59* 70*  --  70* 74* 78*  CREATININE 5.56* 6.42* 6.31* 6.09* 6.19* 6.98*  CALCIUM 8.9 8.9  --  8.7* 8.9 8.7*  MG  --  1.6*  --   --  2.1 2.0  PHOS 4.8*  --   --  5.4* 5.2* 5.4*   GFR: Estimated Creatinine Clearance: 10.1 mL/min (A) (by C-G formula based on SCr of 6.98 mg/dL (H)). Liver Function Tests: Recent Labs  Lab 04/24/19 0416 04/25/19 0513 04/26/19 0443  AST  --  19 15  ALT  --  11 11  ALKPHOS  --  47 50  BILITOT  --  0.6 0.5  PROT  --  7.5 7.8  ALBUMIN 3.1* 3.4* 3.3*   No results for input(s): LIPASE, AMYLASE in the last 168 hours. No results for input(s): AMMONIA in the last 168 hours. Coagulation Profile: Recent Labs  Lab 04/22/19 0615 04/23/19 0649 04/24/19 0416 04/25/19 0513 04/26/19 0443  INR 2.2* 1.4* 1.4* 1.2 1.2   Cardiac Enzymes: No results for input(s): CKTOTAL, CKMB, CKMBINDEX, TROPONINI in the last 168 hours. BNP (last 3 results) No results for input(s): PROBNP in the last 8760 hours. HbA1C: No results for input(s): HGBA1C in the last 72 hours. CBG: Recent Labs  Lab 04/25/19 1633 04/25/19 2219 04/26/19 0751 04/26/19 1203 04/26/19 1449  GLUCAP 104* 83 75 91 122*   Lipid Profile: No results for input(s): CHOL,  HDL, LDLCALC, TRIG, CHOLHDL, LDLDIRECT in the last 72 hours. Thyroid Function Tests: No results for input(s): TSH, T4TOTAL, FREET4, T3FREE, THYROIDAB in the last 72 hours. Anemia Panel: No results for input(s): VITAMINB12, FOLATE, FERRITIN, TIBC, IRON, RETICCTPCT in the last 72 hours. Sepsis Labs: No results for input(s): PROCALCITON,  LATICACIDVEN in the last 168 hours.  Recent Results (from the past 240 hour(s))  SARS Coronavirus 2 (CEPHEID - Performed in [facility] hospital lab), Hosp Order     Status: None   Collection Time: 04/19/19  2:08 PM   Specimen: Nasopharyngeal Swab  Result Value Ref Range Status   SARS Coronavirus 2 NEGATIVE NEGATIVE Final    Comment: (NOTE) If result is NEGATIVE SARS-CoV-2 target nucleic acids are NOT DETECTED. The SARS-CoV-2 RNA is generally detectable in upper and lower  respiratory specimens during the acute phase of infection. The lowest  concentration of SARS-CoV-2 viral copies this assay can detect is 250  copies / mL. A negative result does not preclude SARS-CoV-2 infection  and should not be used as the sole basis for treatment or other  patient management decisions.  A negative result may occur with  improper specimen collection / handling, submission of specimen other  than nasopharyngeal swab, presence of viral mutation(s) within the  areas targeted by this assay, and inadequate number of viral copies  (<250 copies / mL). A negative result must be combined with clinical  observations, patient history, and epidemiological information. If result is POSITIVE SARS-CoV-2 target nucleic acids are DETECTED. The SARS-CoV-2 RNA is generally detectable in upper and lower  respiratory specimens dur ing the acute phase of infection.  Positive  results are indicative of active infection with SARS-CoV-2.  Clinical  correlation with patient history and other diagnostic information is  necessary to determine patient infection status.  Positive results do  not rule out bacterial infection or co-infection with other viruses. If result is PRESUMPTIVE POSTIVE SARS-CoV-2 nucleic acids MAY BE PRESENT.   A presumptive positive result was obtained on the submitted specimen  and confirmed on repeat testing.  While 2019 novel coronavirus  (SARS-CoV-2) nucleic acids may be present in the submitted  sample  additional confirmatory testing may be necessary for epidemiological  and / or clinical management purposes  to differentiate between  SARS-CoV-2 and other Sarbecovirus currently known to infect humans.  If clinically indicated additional testing with an alternate test  methodology [phone]) is advised. The SARS-CoV-2 RNA is generally  detectable in upper and lower respiratory sp ecimens during the acute  phase of infection. The expected result is Negative. Fact Sheet for Patients:  StrictlyIdeas.no Fact Sheet for Healthcare Providers: BankingDealers.co.za This test is not yet approved or cleared by the [place] FDA and has been authorized for detection and/or diagnosis of SARS-CoV-2 by FDA under an Emergency Use Authorization (EUA).  This EUA will remain in effect (meaning this test can be used) for the duration of the COVID-19 declaration under Section 564(b)(1) of the Act, 21 U.S.C. section 360bbb-3(b)(1), unless the authorization is terminated or revoked sooner. Performed at [facility], [place] [address] [place], [place] [address]   Surgical PCR screen     Status: None   Collection Time: 04/26/19  6:12 AM   Specimen: Nasal Mucosa; Nasal Swab  Result Value Ref Range Status   MRSA, PCR NEGATIVE NEGATIVE Final   Staphylococcus aureus NEGATIVE NEGATIVE Final    Comment: (NOTE) The Xpert SA Assay (FDA approved for NASAL specimens in patients 22  years of age and older), is one component of a comprehensive surveillance program. It is not intended to diagnose infection nor to guide or monitor treatment. Performed at [facility], [place] [address] [place], [place] [address]     Radiology Studies: X-ray Chest Pa Or Ap  Result Date: 04/26/2019 CLINICAL DATA:  Encounter for central line placement. EXAM: CHEST  1 VIEW COMPARISON:  04/25/2019 FINDINGS: Right jugular dialysis catheter has been placed.  Catheter tip is at the junction of the SVC and right atrium. Enlargement of the cardiac silhouette without pulmonary edema. Negative for a pneumothorax. The trachea is midline. IMPRESSION: Placement of right jugular dialysis catheter. Catheter tip at the SVC and right atrium junction. Negative for a pneumothorax. Enlargement of the cardiac silhouette that may be accentuated by the technique. Electronically Signed   By: Markus Daft M.D.   On: 04/26/2019 12:53   Dg Chest Port 1 View  Result Date: 04/25/2019 CLINICAL DATA:  Shortness of breath EXAM: PORTABLE CHEST 1 VIEW COMPARISON:  April 19, 2019 FINDINGS: The mediastinal contour is normal. The heart size is enlarged. Mild hazy opacity of bilateral lung bases are identified. There is no pulmonary edema or pleural effusion. IMPRESSION: Mild hazy opacity of bilateral lungs. This is nonspecific. Viral pneumonia is not excluded. Electronically Signed   By: Abelardo Diesel M.D.   On: 04/25/2019 10:53   Dg Fluoro Guide Cv Line-no Report  Result Date: 04/26/2019 Fluoroscopy was utilized by the requesting physician.  No radiographic interpretation.   Vas [place] Upper Ext Vein Mapping (pre-op Avf)  Result Date: 04/25/2019 UPPER EXTREMITY VEIN MAPPING  Indications: Pre-dialysis access. Limitations: IV in left arm, tissue disturbance in bilateral forearms and right              AC make it difficult to see anatomy Comparison Study: No prior study on file for comparison Performing Technologist: Sharion Dove RVS  Examination Guidelines: A complete evaluation includes B-mode imaging, spectral Doppler, color Doppler, and power Doppler as needed of all accessible portions of each vessel. Bilateral testing is considered an integral part of a complete examination. Limited examinations for reoccurring indications may be performed as noted. +-----------------+-------------+----------+--------------+ Right Cephalic   Diameter (cm)Depth (cm)   Findings     +-----------------+-------------+----------+--------------+ Prox upper arm                          not visualized +-----------------+-------------+----------+--------------+ Mid upper arm        0.21        2.60                  +-----------------+-------------+----------+--------------+ Dist upper arm       0.27        1.98                  +-----------------+-------------+----------+--------------+ Antecubital fossa    0.26        0.14                  +-----------------+-------------+----------+--------------+ Prox forearm         0.20        1.53                  +-----------------+-------------+----------+--------------+ Mid forearm                             not visualized +-----------------+-------------+----------+--------------+ Wrist  not visualized +-----------------+-------------+----------+--------------+ +-----------------+-------------+----------+--------------+ Right Basilic    Diameter (cm)Depth (cm)   Findings    +-----------------+-------------+----------+--------------+ Prox upper arm       0.39        0.78                  +-----------------+-------------+----------+--------------+ Mid upper arm        0.24        0.70                  +-----------------+-------------+----------+--------------+ Dist upper arm                          not visualized +-----------------+-------------+----------+--------------+ Antecubital fossa                       not visualized +-----------------+-------------+----------+--------------+ Prox forearm         0.35        2.57                  +-----------------+-------------+----------+--------------+ Mid forearm          0.23        1.85                  +-----------------+-------------+----------+--------------+ Wrist                0.16        1.40                  +-----------------+-------------+----------+--------------+  +-----------------+-------------+----------+--------------+ Left Cephalic    Diameter (cm)Depth (cm)   Findings    +-----------------+-------------+----------+--------------+ Prox upper arm                          not visualized +-----------------+-------------+----------+--------------+ Mid upper arm        0.18        2.11                  +-----------------+-------------+----------+--------------+ Dist upper arm       0.20        1.93     branching    +-----------------+-------------+----------+--------------+ Antecubital fossa    0.21        1.50                  +-----------------+-------------+----------+--------------+ Prox forearm                            not visualized +-----------------+-------------+----------+--------------+ Mid forearm                             not visualized +-----------------+-------------+----------+--------------+ Wrist                                   not visualized +-----------------+-------------+----------+--------------+ +-----------------+-------------+----------+--------------+ Left Basilic     Diameter (cm)Depth (cm)   Findings    +-----------------+-------------+----------+--------------+ Prox upper arm                          not visualized +-----------------+-------------+----------+--------------+ Mid upper arm                           not visualized +-----------------+-------------+----------+--------------+ Dist upper arm  not visualized +-----------------+-------------+----------+--------------+ Antecubital fossa                       not visualized +-----------------+-------------+----------+--------------+ Prox forearm                            not visualized +-----------------+-------------+----------+--------------+ Mid forearm                             not visualized +-----------------+-------------+----------+--------------+ Wrist                                    not visualized +-----------------+-------------+----------+--------------+ *See table(s) above for measurements and observations.   Diagnosing physician:    Preliminary     Scheduled Meds: . allopurinol  100 mg Oral BID  . amLODipine  5 mg Oral Daily  . aspirin EC  81 mg Oral Daily  . atorvastatin  40 mg Oral QHS  . calcitRIOL  0.5 mcg Oral Daily  . carvedilol  6.25 mg Oral BID WC  . Chlorhexidine Gluconate Cloth  6 each Topical Q0600  . cholecalciferol  1,000 Units Oral Daily  . docusate sodium  100 mg Oral Daily  . ferrous gluconate  324 mg Oral Q M,W,F  . gabapentin  400 mg Oral QHS  . heparin  5,000 Units Subcutaneous Q8H  . insulin aspart  0-5 Units Subcutaneous QHS  . insulin aspart  0-9 Units Subcutaneous TID WC  . isosorbide-hydrALAZINE  1 tablet Oral TID  . losartan  100 mg Oral Daily  . mouth rinse  15 mL Mouth Rinse BID  . mupirocin ointment  1 application Nasal BID  . pantoprazole  40 mg Oral Daily  . sodium bicarbonate  650 mg Oral BID  . sodium chloride flush  3 mL Intravenous Q12H  . terazosin  5 mg Oral q morning - 10a   Continuous Infusions: . sodium chloride    . sodium chloride    . sodium chloride 10 mL/hr at 04/26/19 7628     LOS: 5 days   Kerney Elbe, DO [facility] PAGER is on AMION  If 7PM-7AM, please contact night-coverage [email or web address] Password [email or web address] 04/26/2019, 3:22 PM

## 2019-04-26 NOTE — Anesthesia Postprocedure Evaluation (Signed)
Anesthesia Post Note  Patient: [name]  Procedure(s) Performed: INSERTION OF HEMODIALYSIS CATHETER - using Palindrome Catheter size 23cm (Right Chest) INSERTION OF ARTERIOVENOUS (AV) GORE-TEX GRAFT ARM - using Gore Vascular Graft size 45cm (Left Arm Upper)     Patient location during evaluation: PACU Anesthesia Type: General Level of consciousness: awake and alert Pain management: pain level controlled Vital Signs Assessment: post-procedure vital signs reviewed and stable Respiratory status: spontaneous breathing, nonlabored ventilation, respiratory function stable and patient connected to nasal cannula oxygen Cardiovascular status: blood pressure returned to baseline and stable Postop Assessment: no apparent nausea or vomiting Anesthetic complications: no    Last Vitals:  Vitals:   04/26/19 1245 04/26/19 1313  BP: 133/86 (!) 142/86  Pulse: 62 65  Resp: 17 18  Temp: (!) 36.3 C   SpO2: 96% 98%    Last Pain:  Vitals:   04/26/19 1313  TempSrc:   PainSc: 0-No pain                 Tiajuana Amass

## 2019-04-26 NOTE — Progress Notes (Signed)
Subjective:  Back from surgery - has not gone to HD yet  " I am ready!!"  Objective Vital signs in last 24 hours: Vitals:   04/26/19 1215 04/26/19 1230 04/26/19 1243 04/26/19 1245  BP:  139/76  133/86  Pulse: (!) 26 64 63 62  Resp: 15 16 20 17   Temp:    (!) 97.4 F (36.3 C)  TempSrc:      SpO2: 98% 94% 97% 96%  Weight:      Height:       Weight change: -0.454 kg  Intake/Output Summary (Last 24 hours) at 04/26/2019 1317 Last data filed at 04/26/2019 1303 Gross per 24 hour  Intake 130 ml  Output 575 ml  Net -445 ml    Assessment/ Plan: Pt is a 78 y.o. yo male with DM, CKD- advanced, CAD who was admitted on 04/19/2019 with SOB - ulitmatey felt that dialysis should be initiated   Assessment/Plan:   # CKD stage V - Patient unsure of etiology - likely secondary to HTN and DM - Plan for initiating HD today after tunneled catheter placement and AVG with vascular.  Likely for second tx on Wed - He would like to do dialysis in [place] - lives off of [address] - put in request for CLIP to OP unit - states he has an appt for driver's license renewal on 8/5 and would like to make this if possible - I let him know we would need to set up HD and go from there  - He would like more information about home dialysis and we discussed today; asked that he also follow up with educator at his outpatient unit  # Acute systolic CHF  - s/p heart cath with medical therapy recommended  -  Lasix IV PRN- now to control with HD  # CAD - s/p heart cath with medical therapy recommended   # HTN with CKD - Acceptable control- low dose coreg and bidi  # Metabolic acidosis  - 2/2 CKD - supplemental bicarbonate 650 mg BID until starts HD     # 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  outpatient 0.5 mcg daily; checked PTH and was 114 and 97 - reduced to calctriol 0.25 mcg three times a week   Phos is OK for now, no binder    Louis Meckel    Labs: Basic Metabolic Panel: Recent Labs  Lab 04/24/19 0416 04/25/19 0513 04/26/19 0443  NA 136 136 139  K 4.9 4.8 4.4  CL 108 105 106  CO2 19* 19* 21*  GLUCOSE 95 79 85  BUN 70* 74* 78*  CREATININE 6.09* 6.19* 6.98*  CALCIUM 8.7* 8.9 8.7*  PHOS 5.4* 5.2* 5.4*   Liver Function Tests: Recent Labs  Lab 04/24/19 0416 04/25/19 0513 04/26/19 0443  AST  --  19 15  ALT  --  11 11  ALKPHOS  --  47 50  BILITOT  --  0.6 0.5  PROT  --  7.5 7.8  ALBUMIN 3.1* 3.4* 3.3*   No results for input(s): LIPASE, AMYLASE in the last 168 hours. No results for input(s): AMMONIA in the last 168 hours. CBC: Recent Labs  Lab 04/20/19 0446 04/23/19 0649 04/23/19 1452 04/25/19 0513 04/26/19 0443  WBC 8.3  7.8 6.2 8.1 8.0  NEUTROABS  --   --   --  4.5 4.6  HGB 9.7* 8.5* 8.5* 7.9* 8.4*  HCT 30.3* 26.4* 25.6* 23.7* 25.7*  MCV 94.1 94.0 90.8 90.5 92.1  PLT 162 150 131* 174 167   Cardiac Enzymes: No results for input(s): CKTOTAL, CKMB, CKMBINDEX, TROPONINI in the last 168 hours. CBG: Recent Labs  Lab 04/25/19 1129 04/25/19 1633 04/25/19 2219 04/26/19 0751 04/26/19 1203  GLUCAP 86 104* 83 75 91    Iron Studies: No results for input(s): IRON, TIBC, TRANSFERRIN, FERRITIN in the last 72 hours. Studies/Results: X-ray Chest Pa Or Ap  Result Date: 04/26/2019 CLINICAL DATA:  Encounter for central line placement. EXAM: CHEST  1 VIEW COMPARISON:  04/25/2019 FINDINGS: Right jugular dialysis catheter has been placed. Catheter tip is at the junction of the SVC and right atrium. Enlargement of the cardiac silhouette without pulmonary edema. Negative for a pneumothorax. The trachea is midline. IMPRESSION: Placement of right jugular dialysis catheter. Catheter tip at the SVC and right atrium junction. Negative for a pneumothorax.  Enlargement of the cardiac silhouette that may be accentuated by the technique. Electronically Signed   By: Markus Daft M.D.   On: 04/26/2019 12:53   Dg Chest Port 1 View  Result Date: 04/25/2019 CLINICAL DATA:  Shortness of breath EXAM: PORTABLE CHEST 1 VIEW COMPARISON:  April 19, 2019 FINDINGS: The mediastinal contour is normal. The heart size is enlarged. Mild hazy opacity of bilateral lung bases are identified. There is no pulmonary edema or pleural effusion. IMPRESSION: Mild hazy opacity of bilateral lungs. This is nonspecific. Viral pneumonia is not excluded. Electronically Signed   By: Abelardo Diesel M.D.   On: 04/25/2019 10:53   Dg Fluoro Guide Cv Line-no Report  Result Date: 04/26/2019 Fluoroscopy was utilized by the requesting physician.  No radiographic interpretation.   Vas [place] Upper Ext Vein Mapping (pre-op Avf)  Result Date: 04/25/2019 UPPER EXTREMITY VEIN MAPPING  Indications: Pre-dialysis access. Limitations: IV in left arm, tissue disturbance in bilateral forearms and right              AC make it difficult to see anatomy Comparison Study: No prior study on file for comparison Performing Technologist: Sharion Dove RVS  Examination Guidelines: A complete evaluation includes B-mode imaging, spectral Doppler, color Doppler, and power Doppler as needed of all accessible portions of each vessel. Bilateral testing is considered an integral part of a complete examination. Limited examinations for reoccurring indications may be performed as noted. +-----------------+-------------+----------+--------------+ Right Cephalic   Diameter (cm)Depth (cm)   Findings    +-----------------+-------------+----------+--------------+ Prox upper arm                          not visualized +-----------------+-------------+----------+--------------+ Mid upper arm        0.21        2.60                  +-----------------+-------------+----------+--------------+ Dist upper arm       0.27        1.98                   +-----------------+-------------+----------+--------------+ Antecubital fossa    0.26        0.14                  +-----------------+-------------+----------+--------------+ Prox forearm         0.20  1.53                  +-----------------+-------------+----------+--------------+ Mid forearm                             not visualized +-----------------+-------------+----------+--------------+ Wrist                                   not visualized +-----------------+-------------+----------+--------------+ +-----------------+-------------+----------+--------------+ Right Basilic    Diameter (cm)Depth (cm)   Findings    +-----------------+-------------+----------+--------------+ Prox upper arm       0.39        0.78                  +-----------------+-------------+----------+--------------+ Mid upper arm        0.24        0.70                  +-----------------+-------------+----------+--------------+ Dist upper arm                          not visualized +-----------------+-------------+----------+--------------+ Antecubital fossa                       not visualized +-----------------+-------------+----------+--------------+ Prox forearm         0.35        2.57                  +-----------------+-------------+----------+--------------+ Mid forearm          0.23        1.85                  +-----------------+-------------+----------+--------------+ Wrist                0.16        1.40                  +-----------------+-------------+----------+--------------+ +-----------------+-------------+----------+--------------+ Left Cephalic    Diameter (cm)Depth (cm)   Findings    +-----------------+-------------+----------+--------------+ Prox upper arm                          not visualized +-----------------+-------------+----------+--------------+ Mid upper arm        0.18        2.11                   +-----------------+-------------+----------+--------------+ Dist upper arm       0.20        1.93     branching    +-----------------+-------------+----------+--------------+ Antecubital fossa    0.21        1.50                  +-----------------+-------------+----------+--------------+ Prox forearm                            not visualized +-----------------+-------------+----------+--------------+ Mid forearm                             not visualized +-----------------+-------------+----------+--------------+ Wrist                                   not  visualized +-----------------+-------------+----------+--------------+ +-----------------+-------------+----------+--------------+ Left Basilic     Diameter (cm)Depth (cm)   Findings    +-----------------+-------------+----------+--------------+ Prox upper arm                          not visualized +-----------------+-------------+----------+--------------+ Mid upper arm                           not visualized +-----------------+-------------+----------+--------------+ Dist upper arm                          not visualized +-----------------+-------------+----------+--------------+ Antecubital fossa                       not visualized +-----------------+-------------+----------+--------------+ Prox forearm                            not visualized +-----------------+-------------+----------+--------------+ Mid forearm                             not visualized +-----------------+-------------+----------+--------------+ Wrist                                   not visualized +-----------------+-------------+----------+--------------+ *See table(s) above for measurements and observations.   Diagnosing physician:    Preliminary    Medications: Infusions: . sodium chloride    . sodium chloride    . sodium chloride 10 mL/hr at 04/26/19 2458    Scheduled Medications: . allopurinol  100 mg Oral  BID  . aspirin EC  81 mg Oral Daily  . atorvastatin  40 mg Oral q1800  . calcitRIOL  0.25 mcg Oral Q M,W,F-HD  . carvedilol  6.25 mg Oral BID WC  . Chlorhexidine Gluconate Cloth  6 each Topical Q0600  . cholecalciferol  1,000 Units Oral Daily  . docusate sodium  100 mg Oral Daily  . gabapentin  100 mg Oral QHS  . heparin  5,000 Units Subcutaneous Q8H  . insulin aspart  0-5 Units Subcutaneous QHS  . insulin aspart  0-9 Units Subcutaneous TID WC  . isosorbide-hydrALAZINE  1 tablet Oral TID  . mouth rinse  15 mL Mouth Rinse BID  . mupirocin ointment  1 application Nasal BID  . pantoprazole  40 mg Oral Daily  . sodium bicarbonate  650 mg Oral BID  . sodium chloride flush  3 mL Intravenous Q12H  . terazosin  5 mg Oral q morning - 10a    have reviewed scheduled and prn medications.  Physical Exam: General: sitting up in bed, NAD Heart: RRR Lungs: mostly clear Abdomen: soft, non tender Extremities: some edema Dialysis Access: new right TDC and left forearm AVG- bruit     04/26/2019,1:17 PM  LOS: 5 days

## 2019-04-26 NOTE — Anesthesia Procedure Notes (Signed)
Procedure Name: LMA Insertion Date/Time: 04/26/2019 9:37 AM Performed by: Jearld Pies, CRNA Pre-anesthesia Checklist: Patient identified, Emergency Drugs available, Suction available and Patient being monitored Patient Re-evaluated:Patient Re-evaluated prior to induction Oxygen Delivery Method: Circle System Utilized Preoxygenation: Pre-oxygenation with 100% oxygen Induction Type: IV induction LMA: LMA inserted LMA Size: 4.0 Number of attempts: 1 Airway Equipment and Method: Bite block Placement Confirmation: positive ETCO2 Tube secured with: Tape Dental Injury: Teeth and Oropharynx as per pre-operative assessment

## 2019-04-26 NOTE — Progress Notes (Signed)
Progress Note  Patient Name: [name] Date of Encounter: 04/26/2019  Primary Cardiologist:   [facility]   Subjective   He is slightly confused after having his graft placed today.  He denies chest pain or SOB.   Inpatient Medications    Scheduled Meds: . [MAR Hold] allopurinol  100 mg Oral BID  . [MAR Hold] aspirin EC  81 mg Oral Daily  . [MAR Hold] atorvastatin  40 mg Oral q1800  . [MAR Hold] calcitRIOL  0.25 mcg Oral Q M,W,F-HD  . [MAR Hold] carvedilol  6.25 mg Oral BID WC  . [MAR Hold] Chlorhexidine Gluconate Cloth  6 each Topical Q0600  . [MAR Hold] cholecalciferol  1,000 Units Oral Daily  . [MAR Hold] docusate sodium  100 mg Oral Daily  . [MAR Hold] gabapentin  100 mg Oral QHS  . [MAR Hold] heparin  5,000 Units Subcutaneous Q8H  . [MAR Hold] insulin aspart  0-5 Units Subcutaneous QHS  . [MAR Hold] insulin aspart  0-9 Units Subcutaneous TID WC  . [MAR Hold] isosorbide-hydrALAZINE  1 tablet Oral TID  . [MAR Hold] mouth rinse  15 mL Mouth Rinse BID  . [MAR Hold] mupirocin ointment  1 application Nasal BID  . [MAR Hold] pantoprazole  40 mg Oral Daily  . [MAR Hold] sodium bicarbonate  650 mg Oral BID  . [MAR Hold] sodium chloride flush  3 mL Intravenous Q12H  . [MAR Hold] terazosin  5 mg Oral q morning - 10a   Continuous Infusions: . [MAR Hold] sodium chloride    . [MAR Hold] sodium chloride    . sodium chloride 10 mL/hr at 04/26/19 0630  . [MAR Hold]  ceFAZolin (ANCEF) IV     PRN Meds: [MAR Hold] sodium chloride, [MAR Hold] sodium chloride, [MAR Hold] acetaminophen, [MAR Hold] alteplase, [MAR Hold] heparin, [MAR Hold] ipratropium-albuterol, [MAR Hold] lidocaine (PF), [MAR Hold] lidocaine-prilocaine, [MAR Hold] nitroGLYCERIN, [MAR Hold] ondansetron (ZOFRAN) IV, [MAR Hold] oxyCODONE-acetaminophen, [MAR Hold] pentafluoroprop-tetrafluoroeth, [MAR Hold] sodium chloride flush   Vital Signs    Vitals:   04/25/19 1442 04/25/19 2051 04/25/19 2217 04/26/19 0639   BP: 130/78  132/71 (!) 146/87  Pulse: 61 64 64 69  Resp:  (!) 24 (!) 25 (!) 22  Temp: 97.7 F (36.5 C)  98.9 F (37.2 C) 98.3 F (36.8 C)  TempSrc: Oral  Oral Oral  SpO2: 97% 100% 97% 97%  Weight:    96.3 kg  Height:        Intake/Output Summary (Last 24 hours) at 04/26/2019 0844 Last data filed at 04/25/2019 1634 Gross per 24 hour  Intake -  Output 650 ml  Net -650 ml   Filed Weights   04/24/19 0419 04/25/19 0610 04/26/19 0639  Weight: 97.2 kg 96.8 kg 96.3 kg    Telemetry    NA - Personally Reviewed  ECG    NA - Personally Reviewed  Physical Exam   GEN: No acute distress.   Neck: No  JVD Cardiac: RRR, no murmurs, rubs, or gallops.  Respiratory: Clear  to auscultation bilaterally. GI: Soft, nontender, non-distended  MS: No  edema; No deformity. Neuro:  Nonfocal  Psych: Normal affect   Labs    Chemistry Recent Labs  Lab 04/24/19 0416 04/25/19 0513 04/26/19 0443  NA 136 136 139  K 4.9 4.8 4.4  CL 108 105 106  CO2 19* 19* 21*  GLUCOSE 95 79 85  BUN 70* 74* 78*  CREATININE 6.09* 6.19* 6.98*  CALCIUM  8.7* 8.9 8.7*  PROT  --  7.5 7.8  ALBUMIN 3.1* 3.4* 3.3*  AST  --  19 15  ALT  --  11 11  ALKPHOS  --  47 50  BILITOT  --  0.6 0.5  GFRNONAA 8* 8* 7*  GFRAA 9* 9* 8*  ANIONGAP 9 12 12      Hematology Recent Labs  Lab 04/23/19 1452 04/25/19 0513 04/26/19 0443  WBC 6.2 8.1 8.0  RBC 2.82* 2.62* 2.79*  HGB 8.5* 7.9* 8.4*  HCT 25.6* 23.7* 25.7*  MCV 90.8 90.5 92.1  MCH 30.1 30.2 30.1  MCHC 33.2 33.3 32.7  RDW 14.9 14.9 14.9  PLT 131* 174 167    Cardiac EnzymesNo results for input(s): TROPONINI in the last 168 hours. No results for input(s): TROPIPOC in the last 168 hours.   BNPNo results for input(s): BNP, PROBNP in the last 168 hours.   DDimer  Recent Labs  Lab 04/19/19 1408  DDIMER 0.81*     Radiology    Dg Chest Port 1 View  Result Date: 04/25/2019 CLINICAL DATA:  Shortness of breath EXAM: PORTABLE CHEST 1 VIEW COMPARISON:  April 19, 2019 FINDINGS: The mediastinal contour is normal. The heart size is enlarged. Mild hazy opacity of bilateral lung bases are identified. There is no pulmonary edema or pleural effusion. IMPRESSION: Mild hazy opacity of bilateral lungs. This is nonspecific. Viral pneumonia is not excluded. Electronically Signed   By: Abelardo Diesel M.D.   On: 04/25/2019 10:53   Vas [place] Upper Ext Vein Mapping (pre-op Avf)  Result Date: 04/25/2019 UPPER EXTREMITY VEIN MAPPING  Indications: Pre-dialysis access. Limitations: IV in left arm, tissue disturbance in bilateral forearms and right              AC make it difficult to see anatomy Comparison Study: No prior study on file for comparison Performing Technologist: Sharion Dove RVS  Examination Guidelines: A complete evaluation includes B-mode imaging, spectral Doppler, color Doppler, and power Doppler as needed of all accessible portions of each vessel. Bilateral testing is considered an integral part of a complete examination. Limited examinations for reoccurring indications may be performed as noted. +-----------------+-------------+----------+--------------+ Right Cephalic   Diameter (cm)Depth (cm)   Findings    +-----------------+-------------+----------+--------------+ Prox upper arm                          not visualized +-----------------+-------------+----------+--------------+ Mid upper arm        0.21        2.60                  +-----------------+-------------+----------+--------------+ Dist upper arm       0.27        1.98                  +-----------------+-------------+----------+--------------+ Antecubital fossa    0.26        0.14                  +-----------------+-------------+----------+--------------+ Prox forearm         0.20        1.53                  +-----------------+-------------+----------+--------------+ Mid forearm                             not visualized  +-----------------+-------------+----------+--------------+ Wrist  not visualized +-----------------+-------------+----------+--------------+ +-----------------+-------------+----------+--------------+ Right Basilic    Diameter (cm)Depth (cm)   Findings    +-----------------+-------------+----------+--------------+ Prox upper arm       0.39        0.78                  +-----------------+-------------+----------+--------------+ Mid upper arm        0.24        0.70                  +-----------------+-------------+----------+--------------+ Dist upper arm                          not visualized +-----------------+-------------+----------+--------------+ Antecubital fossa                       not visualized +-----------------+-------------+----------+--------------+ Prox forearm         0.35        2.57                  +-----------------+-------------+----------+--------------+ Mid forearm          0.23        1.85                  +-----------------+-------------+----------+--------------+ Wrist                0.16        1.40                  +-----------------+-------------+----------+--------------+ +-----------------+-------------+----------+--------------+ Left Cephalic    Diameter (cm)Depth (cm)   Findings    +-----------------+-------------+----------+--------------+ Prox upper arm                          not visualized +-----------------+-------------+----------+--------------+ Mid upper arm        0.18        2.11                  +-----------------+-------------+----------+--------------+ Dist upper arm       0.20        1.93     branching    +-----------------+-------------+----------+--------------+ Antecubital fossa    0.21        1.50                  +-----------------+-------------+----------+--------------+ Prox forearm                            not visualized  +-----------------+-------------+----------+--------------+ Mid forearm                             not visualized +-----------------+-------------+----------+--------------+ Wrist                                   not visualized +-----------------+-------------+----------+--------------+ +-----------------+-------------+----------+--------------+ Left Basilic     Diameter (cm)Depth (cm)   Findings    +-----------------+-------------+----------+--------------+ Prox upper arm                          not visualized +-----------------+-------------+----------+--------------+ Mid upper arm                           not visualized +-----------------+-------------+----------+--------------+ Dist upper arm  not visualized +-----------------+-------------+----------+--------------+ Antecubital fossa                       not visualized +-----------------+-------------+----------+--------------+ Prox forearm                            not visualized +-----------------+-------------+----------+--------------+ Mid forearm                             not visualized +-----------------+-------------+----------+--------------+ Wrist                                   not visualized +-----------------+-------------+----------+--------------+ *See table(s) above for measurements and observations.   Diagnosing physician:    Preliminary     Cardiac Studies   ECHO07/28/2020  1. The left ventricle has moderate-severely reduced systolic function, with an ejection fraction of 30-35%. The cavity size was mildly dilated. There is moderately increased left ventricular wall thickness. Left ventricular diastolic Doppler parameters  are indeterminate. There is incoordinate septal motion. Left ventricular diffuse hypokinesis. 2. Mildly thickened tricuspid valve leaflets. 3. The mitral valve is abnormal. Moderate thickening of the mitral valve leaflet. 4.  Moderately sized vegetation on the tricuspid valve. 5. The tricuspid valve is abnormal. 6. The aortic valve is tricuspid. No stenosis of the aortic valve. 7. The aorta is normal in size and structure. 8. The inferior vena cava was normal in size with <50% respiratory variability.  Nuclear stress test 04/21/2019   There was no ST segment deviation noted during stress.  No T wave inversion was noted during stress.  Findings consistent with ischemia.  This is a high risk study.  The left ventricular ejection fraction is moderately decreased (30-44%).  Moderate sized area of anterolateral wall ischemia at mid and basal level with apical thinning Diffuse hypokinesis worse in the apex EF 31%   Cath 04/23/19:  1. Mild RCA stenosis with severe stenosis in the mid/distal PDA (appropriate for medical therapy with small rea of myocardium supplied) 2. Widely patent left main 3. Diffusely diseased LAD with moderate proximal stenosis and moderate in-stent restenosis in the mid-vessel 4. Widely patent left circumflex 5. Moderately elevated LVEDP     Patient Profile     78 y.o. male  with remote stent to LAD, new onset systolic HF, anterolateral ischemia, LBBB and stage V CKD approaching HD.  Assessment & Plan   1. CAD:   He has no chest pain and no SOB.  Continue medical management.   2. Chronic systolic CHF/cardiomyopathy: LVEF 30-35%. 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 LBBB present. Suboptimalcandidate for CRT-D at presentwith need for HD. Volume management will be per dialysis which will begin later today.   3. CKD stage V: Nephrology following. Dialysis tonight.   4. HTN: BP controlled.  Continue current therapy.    5.  NSVT: Mg 1.6-->2.1 today.  Continue beta blocker.    For questions or updates, please contact [place] Please consult [email or web address] for contact info under Cardiology/STEMI.   Signed, Minus Breeding, MD  04/26/2019, 8:44 AM

## 2019-04-26 NOTE — Progress Notes (Signed)
Per shift change report, Pt to go for HD tonight sometime after 6pm. I spoke with HD RN at 2230 who states Pt will not be done until the morning due to emergent cases in the ED. Pts BP 160's/90/s will go ahead and give HS [place] and previously held [facility]. Will continue to monitor. Jessie Foot, RN

## 2019-04-26 NOTE — Progress Notes (Signed)
Pt had episode of tachycardia with HR up to 140 while up to use urinal. Pt denied complaints. HR back to SR 89. Will continue to monitor. Jessie Foot, RN

## 2019-04-26 NOTE — Op Note (Signed)
    OPERATIVE REPORT  DATE OF SURGERY: 04/26/2019  PATIENT: [name], 78 y.o. male MRN: [id number]  DOB: 1941/01/17  PRE-OPERATIVE DIAGNOSIS: End-stage renal disease  POST-OPERATIVE DIAGNOSIS:  Same  PROCEDURE: #1 right IJ tunneled hemodialysis catheter, #2 left forearm loop AV Gore-Tex graft  SURGEON:  Curt Jews, M.D.  PHYSICIAN ASSISTANT: Nurse  ANESTHESIA: General  EBL: per anesthesia record  Total I/O In: 130 [P.O.:100; I.V.:30] Out: 25 [Blood:25]  BLOOD ADMINISTERED: none  DRAINS: none  SPECIMEN: none  COUNTS CORRECT:  YES  PATIENT DISPOSITION:  PACU - hemodynamically stable  PROCEDURE DETAILS: Patient was taken up and placed supine position where the area of the right and left neck and chest prepped draped you sterile fashion.  Using a SonoSite visualization the right internal jugular vein was accessed with an 18-gauge needle a guidewire was passed down the level the right atrium.  This was confirmed with fluoroscopy.  Next a dilator and peel-away sheath was passed over the guidewire and the dilator and guidewire removed.  A 23 cm catheter was positioned through the peel-away sheath and the peel-away sheath was removed.  The catheter tips were positioned the level of the distal right atrium.  The catheter was brought through a subcutaneous tunnel through a separate stab incision.  2 lm ports were attached and both lumens flushed and aspirated easily and were locked 1000/cc heparin.  Catheter was secured to the skin with a 3-0 nylon stitch and the entry site was closed with 4 subcuticular Vicryl stitch.  Sterile dressing was applied.   She was turned to the left arm.  Preoperative imaging revealed extremely small surface veins.  I reimage the arm with SonoSite and this was indeed the case with the very small cephalic and basilic veins.  Incision was made over the antecubital space and carried down to isolate the brachial artery which was of adequate size.  The  brachial vein alongside was also of adequate size.  A separate incision was made over the distal forearm in a loop configuration tunnel was created.  A 4 x 7 mm tapered graft was brought through the tunnel.  The brachial artery was occluded proximally distally and was opened with 11 blade sent alternating with Potts scissors.  The graft spatulated and sewn end-to-side to the artery with a running 6-0 Prolene suture.  The graft was flushed with heparinized saline and reoccluded.  Next the brachial vein was occluded proximally distally and was opened with 11 blade and sent longstanding with Potts scissors.  The 7 mm portion of the graft was cut to the appropriate length spatulated and sewn end-to-side to the vein with a running 6-0 Prolene suture.  Clamps removed and good thrill was noted.  The wounds irrigated with saline.  Hemostasis was obtained electrocautery.  Wounds were closed with 3-0 Vicryl in the subcutaneous and subcuticular tissue.  Sterile dressing was applied and the patient was transferred to the recovery room where chest x-rays pending   Rosetta Posner, M.D., [facility] 04/26/2019 1:36 PM

## 2019-04-27 ENCOUNTER — Encounter (HOSPITAL_COMMUNITY): Payer: Self-pay | Admitting: Vascular Surgery

## 2019-04-27 LAB — CBC WITH DIFFERENTIAL/PLATELET
Abs Immature Granulocytes: 0.08 10*3/uL — ABNORMAL HIGH (ref 0.00–0.07)
Basophils Absolute: 0 10*3/uL (ref 0.0–0.1)
Basophils Relative: 0 %
Eosinophils Absolute: 0 10*3/uL (ref 0.0–0.5)
Eosinophils Relative: 0 %
HCT: 25.1 % — ABNORMAL LOW (ref 39.0–52.0)
Hemoglobin: 8.4 g/dL — ABNORMAL LOW (ref 13.0–17.0)
Immature Granulocytes: 1 %
Lymphocytes Relative: 12 %
Lymphs Abs: 1.3 10*3/uL (ref 0.7–4.0)
MCH: 30.7 pg (ref 26.0–34.0)
MCHC: 33.5 g/dL (ref 30.0–36.0)
MCV: 91.6 fL (ref 80.0–100.0)
Monocytes Absolute: 0.7 10*3/uL (ref 0.1–1.0)
Monocytes Relative: 7 %
Neutro Abs: 8.9 10*3/uL — ABNORMAL HIGH (ref 1.7–7.7)
Neutrophils Relative %: 80 %
Platelets: 164 10*3/uL (ref 150–400)
RBC: 2.74 MIL/uL — ABNORMAL LOW (ref 4.22–5.81)
RDW: 15 % (ref 11.5–15.5)
WBC: 11 10*3/uL — ABNORMAL HIGH (ref 4.0–10.5)
nRBC: 0 % (ref 0.0–0.2)

## 2019-04-27 LAB — COMPREHENSIVE METABOLIC PANEL
ALT: 10 U/L (ref 0–44)
AST: 16 U/L (ref 15–41)
Albumin: 3 g/dL — ABNORMAL LOW (ref 3.5–5.0)
Alkaline Phosphatase: 44 U/L (ref 38–126)
Anion gap: 11 (ref 5–15)
BUN: 83 mg/dL — ABNORMAL HIGH (ref 8–23)
CO2: 19 mmol/L — ABNORMAL LOW (ref 22–32)
Calcium: 8.7 mg/dL — ABNORMAL LOW (ref 8.9–10.3)
Chloride: 109 mmol/L (ref 98–111)
Creatinine, Ser: 6.47 mg/dL — ABNORMAL HIGH (ref 0.61–1.24)
GFR calc Af Amer: 9 mL/min — ABNORMAL LOW (ref >60)
GFR calc non Af Amer: 8 mL/min — ABNORMAL LOW (ref >60)
Glucose, Bld: 131 mg/dL — ABNORMAL HIGH (ref 70–99)
Potassium: 4.7 mmol/L (ref 3.5–5.1)
Sodium: 139 mmol/L (ref 135–145)
Total Bilirubin: 0.3 mg/dL (ref 0.3–1.2)
Total Protein: 7.3 g/dL (ref 6.5–8.1)

## 2019-04-27 LAB — GLUCOSE, CAPILLARY
Glucose-Capillary: 176 mg/dL — ABNORMAL HIGH (ref 70–99)
Glucose-Capillary: 63 mg/dL — ABNORMAL LOW (ref 70–99)
Glucose-Capillary: 93 mg/dL (ref 70–99)
Glucose-Capillary: 216 mg/dL — ABNORMAL HIGH (ref 70–99)
Glucose-Capillary: 118 mg/dL — ABNORMAL HIGH (ref 70–99)

## 2019-04-27 LAB — PROTIME-INR
INR: 1.3 — ABNORMAL HIGH (ref 0.8–1.2)
Prothrombin Time: 15.6 seconds — ABNORMAL HIGH (ref 11.4–15.2)

## 2019-04-27 LAB — PHOSPHORUS: Phosphorus: 4.7 mg/dL — ABNORMAL HIGH (ref 2.5–4.6)

## 2019-04-27 LAB — HEPATITIS B SURFACE ANTIGEN: Hepatitis B Surface Ag: NEGATIVE

## 2019-04-27 LAB — MAGNESIUM: Magnesium: 1.9 mg/dL (ref 1.7–2.4)

## 2019-04-27 MED ORDER — SODIUM CHLORIDE 0.9 % IV SOLN
510.0000 mg | Freq: Once | INTRAVENOUS | Status: AC
  Administered 2019-04-27: 510 mg via INTRAVENOUS
  Filled 2019-04-27: qty 17

## 2019-04-27 NOTE — Discharge Instructions (Signed)
[facility]  Discharge Instructions  AV Fistula or Graft Surgery for Dialysis Access  Please refer to the following instructions for your post-procedure care. Your surgeon or physician assistant will discuss any changes with you.  Activity  You may drive the day following your surgery, if you are comfortable and no longer taking prescription pain medication. Resume full activity as the soreness in your incision resolves.  Bathing/Showering  You may shower after you go home. Keep your incision dry for 48 hours. Do not soak in a bathtub, hot tub, or swim until the incision heals completely. You may not shower if you have a hemodialysis catheter.  Incision Care  Clean your incision with mild soap and water after 48 hours. Pat the area dry with a clean towel. You do not need a bandage unless otherwise instructed. Do not apply any ointments or creams to your incision. You may have skin glue on your incision. Do not peel it off. It will come off on its own in about one week. Your arm may swell a bit after surgery. To reduce swelling use pillows to elevate your arm so it is above your heart. Your doctor will tell you if you need to lightly wrap your arm with an ACE bandage.  Diet  Resume your normal diet. There are not special food restrictions following this procedure. In order to heal from your surgery, it is CRITICAL to get adequate nutrition. Your body requires vitamins, minerals, and protein. Vegetables are the best source of vitamins and minerals. Vegetables also provide the perfect balance of protein. Processed food has little nutritional value, so try to avoid this.  Medications  Resume taking all of your medications. If your incision is causing pain, you may take over-the counter pain relievers such as acetaminophen (Tylenol). If you were prescribed a stronger pain medication, please be aware these medications can cause nausea and constipation. Prevent  nausea by taking the medication with a snack or meal. Avoid constipation by drinking plenty of fluids and eating foods with high amount of fiber, such as fruits, vegetables, and grains.  Do not take Tylenol if you are taking prescription pain medications.  Follow up Your surgeon may want to see you in the office following your access surgery. If so, this will be arranged at the time of your surgery.  Please call us immediately for any of the following conditions:  Increased pain, redness, drainage (pus) from your incision site Fever of 101 degrees or higher Severe or worsening pain at your incision site Hand pain or numbness.  Reduce your risk of vascular disease:  Stop smoking. If you would like help, call [facility] at 1-800-QUIT-NOW (270) 809-0827) or [place] at [place] your cholesterol Maintain a desired weight Control your diabetes Keep your blood pressure down  Dialysis  It will take several weeks to several months for your new dialysis access to be ready for use. Your surgeon will determine when it is okay to use it. Your nephrologist will continue to direct your dialysis. You can continue to use your Permcath until your new access is ready for use.   04/27/2019 Nigel Mormon [id number] [birth year]  Surgeon(s): Early, Arvilla Meres, MD  Procedure(s): INSERTION OF HEMODIALYSIS CATHETER - using Palindrome Catheter size 23cm INSERTION OF ARTERIOVENOUS (AV) GORE-TEX GRAFT ARM - using Gore Vascular Graft size 45cm  x Do not stick graft for 4 weeks    If you have any questions, please call the office at  [phone]. ° ° °

## 2019-04-27 NOTE — Progress Notes (Signed)
Subjective:  Ended up not getting HD yesterday due to emergencies last night.  On HD at present - thinks he might be going home ?? Does not have HD spot yet  Objective Vital signs in last 24 hours: Vitals:   04/26/19 2123 04/26/19 2125 04/27/19 0600 04/27/19 0746  BP: (!) 162/117 (!) 167/97    Pulse: 85 74  75  Resp:      Temp: 98.2 F (36.8 C)     TempSrc: Oral     SpO2: 98%   100%  Weight:   96.8 kg   Height:       Weight change: 0.544 kg  Intake/Output Summary (Last 24 hours) at 04/27/2019 1054 Last data filed at 04/27/2019 1009 Gross per 24 hour  Intake 230 ml  Output 950 ml  Net -720 ml    Assessment/ Plan: Pt is a 78 y.o. yo male with DM, CKD- advanced, CAD who was admitted on 04/19/2019 with SOB - ulitmatey felt that dialysis should be initiated   Assessment/Plan:   # CKD stage V  - likely secondary to HTN and DM - Plan for initiating HD today.  Likely for second tx on Wed- tomorrow - he says he is going home today , wants to get to his drivers license renewal for tomorrow so might not be able to get another tx in house.  As long as has appt by Thursday or Friday should be OK  - He would like to do dialysis in [place] - lives off of [address] - put in request for CLIP to OP unit   - He would like more information about home dialysis and we discussed today; asked that he also follow up with educator at his outpatient unit  # Acute systolic CHF  - s/p heart cath with medical therapy recommended  -  Lasix IV PRN- now to control with HD  # CAD - s/p heart cath with medical therapy recommended   # HTN with CKD - Acceptable control- low dose coreg and bidil- now started on cozaar - can follow further at OP center   # Metabolic acidosis  - 2/2 CKD control with HD   # 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 [name]) - may be  able to give ?  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8 7.3  ALBUMIN 3.4* 3.3* 3.0*   No results for input(s): LIPASE, AMYLASE in the last 168 hours. No results for input(s): AMMONIA in the last 168 hours. CBC: Recent  Labs  Lab 04/23/19 0649 04/23/19 1452 04/25/19 0513 04/26/19 0443 04/27/19 0309  WBC 7.8 6.2 8.1 8.0 11.0*  NEUTROABS  --   --  4.5 4.6 8.9*  HGB 8.5* 8.5* 7.9* 8.4* 8.4*  HCT 26.4* 25.6* 23.7* 25.7* 25.1*  MCV 94.0 90.8 90.5 92.1 91.6  PLT 150 131* 174 167 164   Cardiac Enzymes: No results for input(s): CKTOTAL, CKMB, CKMBINDEX, TROPONINI in the last 168 hours. CBG: Recent Labs  Lab 04/26/19 1203 04/26/19 1449 04/26/19 1725 04/26/19 2128 04/27/19 0800  GLUCAP 91 122* 230* 127* 176*    Iron Studies: No results for input(s): IRON, TIBC, TRANSFERRIN, FERRITIN in the last 72 hours. Studies/Results: X-ray Chest Pa Or Ap  Result Date: 04/26/2019 CLINICAL DATA:  Encounter for central line placement. EXAM: CHEST  1 VIEW COMPARISON:  04/25/2019 FINDINGS: Right jugular dialysis catheter has been placed. Catheter tip is at the junction of the SVC and right atrium. Enlargement of the cardiac silhouette without pulmonary edema. Negative for a pneumothorax. The trachea is midline. IMPRESSION: Placement of right jugular dialysis  catheter. Catheter tip at the SVC and right atrium junction. Negative for a pneumothorax. Enlargement of the cardiac silhouette that may be accentuated by the technique. Electronically Signed   By: Markus Daft M.D.   On: 04/26/2019 12:53   Dg Fluoro Guide Cv Line-no Report  Result Date: 04/26/2019 Fluoroscopy was utilized by the requesting physician.  No radiographic interpretation.   Vas [place] Upper Ext Vein Mapping (pre-op Avf)  Result Date: 04/26/2019 UPPER EXTREMITY VEIN MAPPING  Indications: Pre-dialysis access. Limitations: IV in left arm, tissue disturbance in bilateral forearms and right              AC make it difficult to see anatomy Comparison Study: No prior study on file for comparison Performing Technologist: Sharion Dove RVS  Examination Guidelines: A complete evaluation includes B-mode imaging, spectral Doppler, color Doppler, and power Doppler as needed of all accessible portions of each vessel. Bilateral testing is considered an integral part of a complete examination. Limited examinations for reoccurring indications may be performed as noted. +-----------------+-------------+----------+--------------+ Right Cephalic   Diameter (cm)Depth (cm)   Findings    +-----------------+-------------+----------+--------------+ Prox upper arm                          not visualized +-----------------+-------------+----------+--------------+ Mid upper arm        0.21        2.60                  +-----------------+-------------+----------+--------------+ Dist upper arm       0.27        1.98                  +-----------------+-------------+----------+--------------+ Antecubital fossa    0.26        0.14                  +-----------------+-------------+----------+--------------+ Prox forearm         0.20        1.53                  +-----------------+-------------+----------+--------------+ Mid forearm                             not visualized  +-----------------+-------------+----------+--------------+ Wrist  not visualized +-----------------+-------------+----------+--------------+ +-----------------+-------------+----------+--------------+ Right Basilic    Diameter (cm)Depth (cm)   Findings    +-----------------+-------------+----------+--------------+ Prox upper arm       0.39        0.78                  +-----------------+-------------+----------+--------------+ Mid upper arm        0.24        0.70                  +-----------------+-------------+----------+--------------+ Dist upper arm                          not visualized +-----------------+-------------+----------+--------------+ Antecubital fossa                       not visualized +-----------------+-------------+----------+--------------+ Prox forearm         0.35        2.57                  +-----------------+-------------+----------+--------------+ Mid forearm          0.23        1.85                  +-----------------+-------------+----------+--------------+ Wrist                0.16        1.40                  +-----------------+-------------+----------+--------------+ +-----------------+-------------+----------+--------------+ Left Cephalic    Diameter (cm)Depth (cm)   Findings    +-----------------+-------------+----------+--------------+ Prox upper arm                          not visualized +-----------------+-------------+----------+--------------+ Mid upper arm        0.18        2.11                  +-----------------+-------------+----------+--------------+ Dist upper arm       0.20        1.93     branching    +-----------------+-------------+----------+--------------+ Antecubital fossa    0.21        1.50                  +-----------------+-------------+----------+--------------+ Prox forearm                            not visualized  +-----------------+-------------+----------+--------------+ Mid forearm                             not visualized +-----------------+-------------+----------+--------------+ Wrist                                   not visualized +-----------------+-------------+----------+--------------+ +-----------------+-------------+----------+--------------+ Left Basilic     Diameter (cm)Depth (cm)   Findings    +-----------------+-------------+----------+--------------+ Prox upper arm                          not visualized +-----------------+-------------+----------+--------------+ Mid upper arm                           not visualized +-----------------+-------------+----------+--------------+ Dist upper arm  not visualized +-----------------+-------------+----------+--------------+ Antecubital fossa                       not visualized +-----------------+-------------+----------+--------------+ Prox forearm                            not visualized +-----------------+-------------+----------+--------------+ Mid forearm                             not visualized +-----------------+-------------+----------+--------------+ Wrist                                   not visualized +-----------------+-------------+----------+--------------+ *See table(s) above for measurements and observations.   Diagnosing physician: Ruta Hinds MD Electronically signed by Ruta Hinds MD on 04/26/2019 at 4:04:19 PM.    Final    Medications: Infusions: . sodium chloride    . sodium chloride    . sodium chloride 10 mL/hr at 04/26/19 6301    Scheduled Medications: . allopurinol  100 mg Oral BID  . amLODipine  5 mg Oral Daily  . aspirin EC  81 mg Oral Daily  . atorvastatin  40 mg Oral QHS  . calcitRIOL  0.5 mcg Oral Daily  . carvedilol  6.25 mg Oral BID WC  . Chlorhexidine Gluconate Cloth  6 each Topical Q0600  . cholecalciferol  1,000 Units Oral Daily  .  docusate sodium  100 mg Oral Daily  . ferrous gluconate  324 mg Oral Q M,W,F  . gabapentin  400 mg Oral QHS  . heparin  5,000 Units Subcutaneous Q8H  . insulin aspart  0-5 Units Subcutaneous QHS  . insulin aspart  0-9 Units Subcutaneous TID WC  . isosorbide-hydrALAZINE  1 tablet Oral TID  . losartan  100 mg Oral Daily  . mouth rinse  15 mL Mouth Rinse BID  . pantoprazole  40 mg Oral Daily  . sodium bicarbonate  650 mg Oral BID  . sodium chloride flush  3 mL Intravenous Q12H  . terazosin  5 mg Oral q morning - 10a    have reviewed scheduled and prn medications.  Physical Exam: General:  NAD Heart: RRR Lungs: mostly clear Abdomen: soft, non tender Extremities: some edema Dialysis Access: new right TDC and left forearm AVG- bruit     04/27/2019,10:54 AM  LOS: 6 days

## 2019-04-27 NOTE — Progress Notes (Signed)
Progress Note  Patient Name: [name] Date of Encounter: 04/27/2019  Primary Cardiologist:   [facility]   Subjective   Denies chest pain or SOB.  No palpitations.   Inpatient Medications    Scheduled Meds: . allopurinol  100 mg Oral BID  . amLODipine  5 mg Oral Daily  . aspirin EC  81 mg Oral Daily  . atorvastatin  40 mg Oral QHS  . calcitRIOL  0.5 mcg Oral Daily  . carvedilol  6.25 mg Oral BID WC  . Chlorhexidine Gluconate Cloth  6 each Topical Q0600  . cholecalciferol  1,000 Units Oral Daily  . docusate sodium  100 mg Oral Daily  . ferrous gluconate  324 mg Oral Q M,W,F  . gabapentin  400 mg Oral QHS  . heparin  5,000 Units Subcutaneous Q8H  . insulin aspart  0-5 Units Subcutaneous QHS  . insulin aspart  0-9 Units Subcutaneous TID WC  . isosorbide-hydrALAZINE  1 tablet Oral TID  . losartan  100 mg Oral Daily  . mouth rinse  15 mL Mouth Rinse BID  . pantoprazole  40 mg Oral Daily  . sodium bicarbonate  650 mg Oral BID  . sodium chloride flush  3 mL Intravenous Q12H  . terazosin  5 mg Oral q morning - 10a   Continuous Infusions: . sodium chloride    . sodium chloride    . sodium chloride 10 mL/hr at 04/26/19 1497   PRN Meds: sodium chloride, sodium chloride, acetaminophen, alteplase, heparin, ipratropium-albuterol, lidocaine (PF), lidocaine-prilocaine, nitroGLYCERIN, ondansetron (ZOFRAN) IV, oxyCODONE-acetaminophen, pentafluoroprop-tetrafluoroeth, sodium chloride flush   Vital Signs    Vitals:   04/26/19 2123 04/26/19 2125 04/27/19 0600 04/27/19 0746  BP: (!) 162/117 (!) 167/97    Pulse: 85 74  75  Resp:      Temp: 98.2 F (36.8 C)     TempSrc: Oral     SpO2: 98%   100%  Weight:   96.8 kg   Height:        Intake/Output Summary (Last 24 hours) at 04/27/2019 1035 Last data filed at 04/27/2019 1009 Gross per 24 hour  Intake 230 ml  Output 975 ml  Net -745 ml   Filed Weights   04/25/19 0610 04/26/19 0639 04/27/19 0600  Weight: 96.8 kg 96.3 kg  96.8 kg    Telemetry    NA (I cannot review the tracings from last night)  - Personally Reviewed  ECG    NA - Personally Reviewed  Physical Exam   GEN: No  acute distress.   Neck: No  JVD Cardiac: RRR, no murmurs, rubs, or gallops.  Respiratory: Clear   to auscultation bilaterally. GI: Soft, nontender, non-distended, normal bowel sounds  MS:   No edema; No deformity.  Left forearm fistula intact  Neuro:   Nonfocal  Psych: Oriented and appropriate    Labs    Chemistry Recent Labs  Lab 04/25/19 0513 04/26/19 0443 04/27/19 0309  NA 136 139 139  K 4.8 4.4 4.7  CL 105 106 109  CO2 19* 21* 19*  GLUCOSE 79 85 131*  BUN 74* 78* 83*  CREATININE 6.19* 6.98* 6.47*  CALCIUM 8.9 8.7* 8.7*  PROT 7.5 7.8 7.3  ALBUMIN 3.4* 3.3* 3.0*  AST 19 15 16   ALT 11 11 10   ALKPHOS 47 50 44  BILITOT 0.6 0.5 0.3  GFRNONAA 8* 7* 8*  GFRAA 9* 8* 9*  ANIONGAP 12 12 11      Hematology Recent  Labs  Lab 04/25/19 0513 04/26/19 0443 04/27/19 0309  WBC 8.1 8.0 11.0*  RBC 2.62* 2.79* 2.74*  HGB 7.9* 8.4* 8.4*  HCT 23.7* 25.7* 25.1*  MCV 90.5 92.1 91.6  MCH 30.2 30.1 30.7  MCHC 33.3 32.7 33.5  RDW 14.9 14.9 15.0  PLT 174 167 164    Cardiac EnzymesNo results for input(s): TROPONINI in the last 168 hours. No results for input(s): TROPIPOC in the last 168 hours.   BNPNo results for input(s): BNP, PROBNP in the last 168 hours.   DDimer  No results for input(s): DDIMER in the last 168 hours.   Radiology    X-ray Chest Pa Or Ap  Result Date: 04/26/2019 CLINICAL DATA:  Encounter for central line placement. EXAM: CHEST  1 VIEW COMPARISON:  04/25/2019 FINDINGS: Right jugular dialysis catheter has been placed. Catheter tip is at the junction of the SVC and right atrium. Enlargement of the cardiac silhouette without pulmonary edema. Negative for a pneumothorax. The trachea is midline. IMPRESSION: Placement of right jugular dialysis catheter. Catheter tip at the SVC and right atrium junction.  Negative for a pneumothorax. Enlargement of the cardiac silhouette that may be accentuated by the technique. Electronically Signed   By: Markus Daft M.D.   On: 04/26/2019 12:53   Dg Fluoro Guide Cv Line-no Report  Result Date: 04/26/2019 Fluoroscopy was utilized by the requesting physician.  No radiographic interpretation.   Vas [place] Upper Ext Vein Mapping (pre-op Avf)  Result Date: 04/26/2019 UPPER EXTREMITY VEIN MAPPING  Indications: Pre-dialysis access. Limitations: IV in left arm, tissue disturbance in bilateral forearms and right              AC make it difficult to see anatomy Comparison Study: No prior study on file for comparison Performing Technologist: Sharion Dove RVS  Examination Guidelines: A complete evaluation includes B-mode imaging, spectral Doppler, color Doppler, and power Doppler as needed of all accessible portions of each vessel. Bilateral testing is considered an integral part of a complete examination. Limited examinations for reoccurring indications may be performed as noted. +-----------------+-------------+----------+--------------+ Right Cephalic   Diameter (cm)Depth (cm)   Findings    +-----------------+-------------+----------+--------------+ Prox upper arm                          not visualized +-----------------+-------------+----------+--------------+ Mid upper arm        0.21        2.60                  +-----------------+-------------+----------+--------------+ Dist upper arm       0.27        1.98                  +-----------------+-------------+----------+--------------+ Antecubital fossa    0.26        0.14                  +-----------------+-------------+----------+--------------+ Prox forearm         0.20        1.53                  +-----------------+-------------+----------+--------------+ Mid forearm                             not visualized +-----------------+-------------+----------+--------------+ Wrist  not visualized +-----------------+-------------+----------+--------------+ +-----------------+-------------+----------+--------------+ Right Basilic    Diameter (cm)Depth (cm)   Findings    +-----------------+-------------+----------+--------------+ Prox upper arm       0.39        0.78                  +-----------------+-------------+----------+--------------+ Mid upper arm        0.24        0.70                  +-----------------+-------------+----------+--------------+ Dist upper arm                          not visualized +-----------------+-------------+----------+--------------+ Antecubital fossa                       not visualized +-----------------+-------------+----------+--------------+ Prox forearm         0.35        2.57                  +-----------------+-------------+----------+--------------+ Mid forearm          0.23        1.85                  +-----------------+-------------+----------+--------------+ Wrist                0.16        1.40                  +-----------------+-------------+----------+--------------+ +-----------------+-------------+----------+--------------+ Left Cephalic    Diameter (cm)Depth (cm)   Findings    +-----------------+-------------+----------+--------------+ Prox upper arm                          not visualized +-----------------+-------------+----------+--------------+ Mid upper arm        0.18        2.11                  +-----------------+-------------+----------+--------------+ Dist upper arm       0.20        1.93     branching    +-----------------+-------------+----------+--------------+ Antecubital fossa    0.21        1.50                  +-----------------+-------------+----------+--------------+ Prox forearm                            not visualized +-----------------+-------------+----------+--------------+ Mid forearm                             not  visualized +-----------------+-------------+----------+--------------+ Wrist                                   not visualized +-----------------+-------------+----------+--------------+ +-----------------+-------------+----------+--------------+ Left Basilic     Diameter (cm)Depth (cm)   Findings    +-----------------+-------------+----------+--------------+ Prox upper arm                          not visualized +-----------------+-------------+----------+--------------+ Mid upper arm                           not visualized +-----------------+-------------+----------+--------------+ Dist upper arm  not visualized +-----------------+-------------+----------+--------------+ Antecubital fossa                       not visualized +-----------------+-------------+----------+--------------+ Prox forearm                            not visualized +-----------------+-------------+----------+--------------+ Mid forearm                             not visualized +-----------------+-------------+----------+--------------+ Wrist                                   not visualized +-----------------+-------------+----------+--------------+ *See table(s) above for measurements and observations.   Diagnosing physician: Ruta Hinds MD Electronically signed by Ruta Hinds MD on 04/26/2019 at 4:04:19 PM.    Final     Cardiac Studies   ECHO07/28/2020  1. The left ventricle has moderate-severely reduced systolic function, with an ejection fraction of 30-35%. The cavity size was mildly dilated. There is moderately increased left ventricular wall thickness. Left ventricular diastolic Doppler parameters  are indeterminate. There is incoordinate septal motion. Left ventricular diffuse hypokinesis. 2. Mildly thickened tricuspid valve leaflets. 3. The mitral valve is abnormal. Moderate thickening of the mitral valve leaflet. 4. Moderately sized vegetation  on the tricuspid valve. 5. The tricuspid valve is abnormal. 6. The aortic valve is tricuspid. No stenosis of the aortic valve. 7. The aorta is normal in size and structure. 8. The inferior vena cava was normal in size with <50% respiratory variability.  Nuclear stress test 04/21/2019   There was no ST segment deviation noted during stress.  No T wave inversion was noted during stress.  Findings consistent with ischemia.  This is a high risk study.  The left ventricular ejection fraction is moderately decreased (30-44%).  Moderate sized area of anterolateral wall ischemia at mid and basal level with apical thinning Diffuse hypokinesis worse in the apex EF 31%   Cath 04/23/19:  1. Mild RCA stenosis with severe stenosis in the mid/distal PDA (appropriate for medical therapy with small rea of myocardium supplied) 2. Widely patent left main 3. Diffusely diseased LAD with moderate proximal stenosis and moderate in-stent restenosis in the mid-vessel 4. Widely patent left circumflex 5. Moderately elevated LVEDP     Patient Profile     78 y.o. male  with remote stent to LAD, new onset systolic HF, anterolateral ischemia, LBBB and stage V CKD approaching HD.  Assessment & Plan   1. CAD:   The patient has no new sypmtoms.  No further cardiovascular testing is indicated.  We will continue with aggressive risk reduction and meds as listed.  2. Chronic systolic CHF/cardiomyopathy: LVEF 30-35%. 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 LBBB present. Suboptimalcandidate for CRT-D at presentwith need for HD. Started dialysis last night.  Volume management per dialysis.    3. CKD stage V: Nephrology following. Dialysis initiated.   4. HTN:   Multiple doses of meds held yesterday because of surgery.  Follow BP now that they have been restarted.    5.  SVT: Question sinus tach last night.  Resumed meds.  Follow on tele tonight.     For questions or updates, please contact  [place] Please consult [email or web address] for contact info under Cardiology/STEMI.   Signed, Minus Breeding, MD  04/27/2019, 10:35 AM

## 2019-04-27 NOTE — Plan of Care (Signed)
  Problem: Health Behavior/Discharge Planning: °Goal: Ability to manage health-related needs will improve °Outcome: Progressing °  °Problem: Clinical Measurements: °Goal: Ability to maintain clinical measurements within normal limits will improve °Outcome: Progressing °  °Problem: Clinical Measurements: °Goal: Respiratory complications will improve °Outcome: Progressing °  °Problem: Clinical Measurements: °Goal: Cardiovascular complication will be avoided °Outcome: Progressing °  °

## 2019-04-27 NOTE — Progress Notes (Signed)
Have spot secured at [facility] at 11:40 can start there on Thursday 8/6 so could be discharged today if clinical scenario still supports   Louis Meckel

## 2019-04-27 NOTE — Progress Notes (Addendum)
PROGRESS NOTE    [name]  PZW:[id number] DOB: [birth year] DOA: 04/19/2019 PCP: No primary care provider on file.   Brief Narrative:  The patient [name] is a 78 year old African-American obese male with a past medical history significant for but limited to hypertension, prostate cancer, CKD stage V, coronary artery disease, type 2 diabetes mellitus and treatment and was gout, history of acute DVT currently on Coumadin and other comorbidities who presents to the emergency room with a history of gradual progressive shortness of breath for about a week.  According to the patient for the last 1 to 2 weeks he become more more progressively short of breath and gradually worsening symptoms with occasional wheezing no cough or fever and this was more so at nighttime without any chest pain.  He was worked up and found to have a low EF and a nuclear stress test was then done which showed moderate sized area of anterior lateral wall ischemia at the mid and basal level with apical thinning.  There is also diffuse hypokinesis worse in the apex and he was transferred from [facility] to [facility] for cardiac catheterization which showed medical disease and mentation was for medical management for LAD and PAD disease.  Cardiology is involved in the recommending continue aspirin, carvedilol, atorvastatin.  Patient CKD stage V worsened and nephrology is following and recommending initiation of dialysis and patient is to undergo vein mapping with placement of temporary dialysis catheter soon and initiation of dialysis on Monday.  Vein mapping was not done until the day before yesterday and vascular surgery was consulted for access placement. Patient unfortunately did not  dialyze yesterday even though Temporary and Permanent access was placed by Dr. Donnetta Hutching. He was seen in Dialysis today and a spot was secured for him at [facility] kidney center on Tuesday Thursday Saturday at 1140 starting on 04/29/2019. Patient  was to be D/C'd today but we will still not discharge home today given Cardiology's recommendation for observing on telemetry tonight given his SVT last night.  Anticipate discharge home early a.m. if he remains medically stable.  Assessment & Plan:   Principal Problem:   Shortness of breath Active Problems:   Coronary artery disease   Hypertension   Chronic kidney disease   Chest pain   Acute combined systolic and diastolic heart failure (HCC)   CKD (chronic kidney disease) stage 5, GFR less than 15 ml/min (HCC)   NSVT (nonsustained ventricular tachycardia) (HCC)   LBBB (left bundle branch block)   Class 2 severe obesity due to excess calories with serious comorbidity and body mass index (BMI) of 38.0 to 38.9 in adult ([place])   Anemia   End stage renal disease (HCC)  Dyspnea on exertion likely secondary to Chronic systolic CHF along with Cardiomyopathy, slightly improved -Status post 2 doses of IV Lasix 60 mg and has not been repeated yet -Work-up for his cardiomyopathy and cardiology feels his cardiomyopathy is out of proportion to his CAD as patient underwent a cardiac catheterization yesterday.  -Cardiology recommending continue aspirin, carvedilol and statin -Nephrology recommending initiation of dialysis and patient is going for vein mapping -Further care per Nephrology and Cardiology -We will continue DuoNeb every 2 hours PRN as well as isosorbide-hydralazine 1 tab p.o. 3 times daily for heart failure along with nitroglycerin -Repeat chest x-ray yesterday afternoon showed "Right jugular dialysis catheter has been placed. Catheter tip is at the junction of the SVC and right atrium. Enlargement of the cardiac silhouette without pulmonary edema.  Negative for a pneumothorax. The trachea is midline." -Further volume maintenance to be initiated via dialysis through his [facility] and patient will need clipping to a dialysis center; now has a bed at [facility] kidney center on Tuesday Thursday  Saturday however will likely discharge in the a.m.  Acute on Chronic Systolic CHF -Been given diuresis x2 with IV Lasix but now nephrology is recommending PRN Lasix -Continue with isosorbide hydralazine as well as carvedilol -Strict I's and O's and daily weights; patient is - 6.738 L since admission and is down 4 pounds since admission -Further volume management per dialysis managed by nephrology  CKD stage V Hyperphosphatemia in the setting of worsening renal dysfunction Metabolic acidosis in the setting of CKD -In the setting of likely hypertension diabetes -Patient's BUN/creatinine today was 83/6.47 -Nephrology initially recommending IV Lasix but now well continue volume maintenance with hemodialysis  -Further care per nephrology -Continue with serum bicarbonate 650 mg p.o. daily continue with calcitriol 0.25 mg p.o. every Monday Wednesday Friday as well as cholecalciferol 1000 units p.o. daily -Patient was also given a dose of Feraheme -Nephrology consulted vascular surgery and vein mapping was to be done. -Vascular surgery evaluated and discussed options with the patient understands agrees to proceed and the duplex shows Small surface veins bilaterally no image with SonoSite ultrasound at the time of surgery but suspect require Gore-Tex graft and they will place a tunneled catheter in the left arm access for tomorrow -Patient is status post temporary dialysis catheter in the right IJ and a left forearm loop AV Gore-Tex graft on 04/26/2019  -Underwent successful dialysis today as unfortunately did not end up getting HD yesterday due to emergencies last night  History of CAD with PCI to LAD in 2012 -Continue with aspirin, statin, and carvedilol -Continue with nitroglycerin 0.4 mg sublingually q. 5 minutes as needed chest pain -Cardiac catheterization as below -Cardiology recommends no further cardiovascular testing indicated and continuing aggressive risk reduction and meds as  listed  Diabetes Mellitus Type 2 -At home is diet controlled -Last hemoglobin A1c was 5.6 -Continue with sensitive NovoLog/scale before meals and at bedtime -CBGs have been ranging from 63-216  Anemia chronic kidney disease in the setting of stage V CKD -Patient's hemoglobin/hematocrit was 8.4/25.1 today -Patient got a dose of Feraheme by nephrology -Continue with ferrous gluconate 324 mg p.o. every Monday Wednesday Friday -They are deferring his ESA given his prostate cancer which is followed by Dr. [name] -Continue monitor for signs and symptoms of bleeding; currently no overt bleeding noted -Repeat CBC in a.m.  OSA -Continue CPAP  History of DVT -Had an infrapopliteal DVT in September 2019 and has had 10 months of anticoagulation and cardiology recommends no longer taking warfarin now as he has had 10 months of treatment  Thrombocytopenia, improved -Patient's platelet count is now 164,000 -We will continue monitor for signs of bleeding -Repeat CBC in a.m.  Hypertension -Blood pressures been borderline and blood pressures medications were held yesterday due to his surgery -Continue with carvedilol as well as isosorbide-hydralazine -Also continue losartan 100 mg p.o. daily t -BP this morning is 158/95  Hyperlipidemia -Continue atorvastatin 40 mg p.o. nightly  Obesity -Estimated body mass index is 32.68 kg/m as calculated from the following:   Height as of this encounter: 5\' 8"  (1.727 m).   Weight as of this encounter: 97.5 kg. -Weight Loss and Dietary Counseling given   History of prostate cancer -Continue and follow with Dr. [name] in outpatient setting -Continue with terazosin  5 mg p.o. every morning  SVT -Cardiology is questioning whether is a sinus tachycardia and have resumed his meds with carvedilol -They are recommending following on telemetry tonight and likely patient can be discharged in the morning  DVT prophylaxis: Heparin 5000 units subcu every 8 now  that he is no longer on warfarin Code Status: FULL CODE  Family Communication: No family present at bedside  Disposition Plan: Remain inpatient for initiation of hemodialysis and underwent successful dialysis today.  Anticipate discharging home in the next 24 to 48 hours if medically stable.  Will obtain a PT and OT consultation prior to discharge.  Consultants:   Cardiology  Nephrology  Vascular surgery  Procedures:  ECHOCARDIOGRAM 04/20/2019 IMPRESSIONS    1. The left ventricle has moderate-severely reduced systolic function, with an ejection fraction of 30-35%. The cavity size was mildly dilated. There is moderately increased left ventricular wall thickness. Left ventricular diastolic Doppler parameters  are indeterminate. There is incoordinate septal motion. Left ventricular diffuse hypokinesis.  2. Mildly thickened tricuspid valve leaflets.  3. The mitral valve is abnormal. Moderate thickening of the mitral valve leaflet.  4. Moderately sized vegetation on the tricuspid valve.  5. The tricuspid valve is abnormal.  6. The aortic valve is tricuspid. No stenosis of the aortic valve.  7. The aorta is normal in size and structure.  8. The inferior vena cava was normal in size with <50% respiratory variability.  SUMMARY   LVEF 30-35%, severe global hypokinesis with regional variation, moderate LVH, mildly reduced RV systolic function, mobile 0.5 x 1.0 cm hyperechoic mass noted on the anterior leaflet of the tricuspid valve, seen prolapsing into the right atrium (consider thrombus, tumor or less likely vegetation), mitral valve thickening with mild regurgitation. Consider TEE to further evaluate the tricuspid valve.  FINDINGS  Left Ventricle: The left ventricle has moderate-severely reduced systolic function, with an ejection fraction of 30-35%. The cavity size was mildly dilated. There is moderately increased left ventricular wall thickness. Left ventricular diastolic  Doppler  parameters are indeterminate. There is incoordinate septal motion. Left ventricular diffuse hypokinesis.  Right Ventricle: The right ventricle has mildly reduced systolic function. The cavity was normal. There is no increase in right ventricular wall thickness.  Left Atrium: Left atrial size was normal in size.  Right Atrium: Right atrial size was normal in size. Right atrial pressure is estimated at 10 mmHg.  Interatrial Septum: No atrial level shunt detected by color flow Doppler.  Pericardium: There is no evidence of pericardial effusion.  Mitral Valve: The mitral valve is abnormal. Moderate thickening of the mitral valve leaflet. Mitral valve regurgitation is mild by color flow Doppler.  Tricuspid Valve: The tricuspid valve is abnormal. Tricuspid valve regurgitation is mild by color flow Doppler. The tricuspid valve is mildly thickened. There is a moderately sized mobile tricuspid valve vegetation on the anterior leaflet. The TV  vegetation measures 5 mm x 10 mm.  Aortic Valve: The aortic valve is tricuspid Aortic valve regurgitation was not visualized by color flow Doppler. There is No stenosis of the aortic valve.  Pulmonic Valve: The pulmonic valve was grossly normal. Pulmonic valve regurgitation is not visualized by color flow Doppler.  Aorta: The aorta is normal in size and structure.  Venous: The inferior vena cava is normal in size with less than 50% respiratory variability.    +--------------+--------++ LEFT VENTRICLE         +--------------+--------++ PLAX 2D                +--------------+--------++  LVIDd:        6.10 cm  +--------------+--------++ LVIDs:        5.20 cm  +--------------+--------++ LV PW:        1.40 cm  +--------------+--------++ LV IVS:       1.40 cm  +--------------+--------++ LVOT diam:    2.10 cm  +--------------+--------++ LV SV:        57 ml    +--------------+--------++ LV SV Index:  25.77     +--------------+--------++ LVOT Area:    3.46 cm +--------------+--------++                        +--------------+--------++  +---------------+-------++-----------++ LEFT ATRIUM           Index       +---------------+-------++-----------++ LA diam:       4.50 cm2.11 cm/m  +---------------+-------++-----------++ LA Vol (A2C):  61.1 ml28.66 ml/m +---------------+-------++-----------++ LA Vol (A4C):  34.1 ml15.99 ml/m +---------------+-------++-----------++ LA Biplane Vol:46.9 ml22.00 ml/m +---------------+-------++-----------++ +------------+---------+++ RIGHT ATRIUM          +------------+---------+++ RA Pressure:8.00 mmHg +------------+---------+++  +------------+-----------++ AORTIC VALVE            +------------+-----------++ LVOT Vmax:  95.40 cm/s  +------------+-----------++ LVOT Vmean: 56.000 cm/s +------------+-----------++ LVOT VTI:   0.162 m     +------------+-----------++   +-------------+-------++ AORTA                +-------------+-------++ Ao Root diam:3.50 cm +-------------+-------++  +--------------+----------++  +---------------+---------++ MITRAL VALVE              TRICUSPID VALVE          +--------------+----------++  +---------------+---------++ MV Area (PHT):4.80 cm    Estimated RAP: 8.00 mmHg +--------------+----------++  +---------------+---------++ MV PHT:       45.82 msec +--------------+----------++  +--------------+-------+ MV Decel Time:158 msec    SHUNTS                +--------------+----------++  +--------------+-------+ +--------------+-----------++ Systemic VTI: 0.16 m  MV E velocity:114.00 cm/s +--------------+-------+ +--------------+-----------++ Systemic Diam:2.10 cm                               +--------------+-------+  NM STRESS TEST 04/21/2019 There was no ST segment deviation noted during stress.  No T  wave inversion was noted during stress.  Findings consistent with ischemia.  This is a high risk study.  The left ventricular ejection fraction is moderately decreased (30-44%).   Moderate sized area of anterolateral wall ischemia at mid and basal level with apical thinning Diffuse hypokinesis worse in the apex EF 31%  CARDIAC CATHETERIZATION 04/23/2019 1. Mild RCA stenosis with severe stenosis in the mid/distal PDA (appropriate for medical therapy with small rea of myocardium supplied) 2. Widely patent left main 3. Diffusely diseased LAD with moderate proximal stenosis and moderate in-stent restenosis in the mid-vessel 4. Widely patent left circumflex 5. Moderately elevated LVEDP  Recommend: medical therapy for CAD and CHF. Cardiomyopathy out of proportion to extent of CAD  VEIN MAPPING, Preliminary Limitations: IV in left arm, tissue disturbance in bilateral forearms and right              AC make it difficult to see anatomy  Comparison Study: No prior study on file for comparison  Performing Technologist: Sharion Dove RVS    Examination Guidelines: A complete evaluation includes B-mode imaging, spectral Doppler, color Doppler, and power Doppler as needed of all  accessible portions of each vessel. Bilateral testing is considered an integral part of a complete examination. Limited examinations for reoccurring indications may be performed as noted.  +-----------------+-------------+----------+--------------+ Right Cephalic   Diameter (cm)Depth (cm)   Findings    +-----------------+-------------+----------+--------------+ Prox upper arm                          not visualized +-----------------+-------------+----------+--------------+ Mid upper arm        0.21        2.60                  +-----------------+-------------+----------+--------------+ Dist upper arm       0.27        1.98                   +-----------------+-------------+----------+--------------+ Antecubital fossa    0.26        0.14                  +-----------------+-------------+----------+--------------+ Prox forearm         0.20        1.53                  +-----------------+-------------+----------+--------------+ Mid forearm                             not visualized +-----------------+-------------+----------+--------------+ Wrist                                   not visualized +-----------------+-------------+----------+--------------+  +-----------------+-------------+----------+--------------+ Right Basilic    Diameter (cm)Depth (cm)   Findings    +-----------------+-------------+----------+--------------+ Prox upper arm       0.39        0.78                  +-----------------+-------------+----------+--------------+ Mid upper arm        0.24        0.70                  +-----------------+-------------+----------+--------------+ Dist upper arm                          not visualized +-----------------+-------------+----------+--------------+ Antecubital fossa                       not visualized +-----------------+-------------+----------+--------------+ Prox forearm         0.35        2.57                  +-----------------+-------------+----------+--------------+ Mid forearm          0.23        1.85                  +-----------------+-------------+----------+--------------+ Wrist                0.16        1.40                  +-----------------+-------------+----------+--------------+  +-----------------+-------------+----------+--------------+ Left Cephalic    Diameter (cm)Depth (cm)   Findings    +-----------------+-------------+----------+--------------+ Prox upper arm                          not visualized +-----------------+-------------+----------+--------------+  Mid upper arm        0.18        2.11                   +-----------------+-------------+----------+--------------+ Dist upper arm       0.20        1.93     branching    +-----------------+-------------+----------+--------------+ Antecubital fossa    0.21        1.50                  +-----------------+-------------+----------+--------------+ Prox forearm                            not visualized +-----------------+-------------+----------+--------------+ Mid forearm                             not visualized +-----------------+-------------+----------+--------------+ Wrist                                   not visualized +-----------------+-------------+----------+--------------+  +-----------------+-------------+----------+--------------+ Left Basilic     Diameter (cm)Depth (cm)   Findings    +-----------------+-------------+----------+--------------+ Prox upper arm                          not visualized +-----------------+-------------+----------+--------------+ Mid upper arm                           not visualized +-----------------+-------------+----------+--------------+ Dist upper arm                          not visualized +-----------------+-------------+----------+--------------+ Antecubital fossa                       not visualized +-----------------+-------------+----------+--------------+ Prox forearm                            not visualized +-----------------+-------------+----------+--------------+ Mid forearm                             not visualized +-----------------+-------------+----------+--------------+ Wrist                                   not visualized +-----------------+-------------+----------+--------------+  *See table(s) above for measurements and observations.  Right IJ tunneled hemodialysis catheter and a left forearm loop AV Gore-Tex graft placed by Dr. Donnetta Hutching on 04/26/2019   Antimicrobials:  Anti-infectives (From  admission, onward)   Start     Dose/Rate Route Frequency Ordered Stop   04/26/19 0900  ceFAZolin (ANCEF) IVPB 2g/100 mL premix    Note to Pharmacy: Send with pt to OR   2 g 200 mL/hr over 30 Minutes Intravenous To ShortStay Surgical 04/25/19 1502 04/26/19 1010   04/26/19 0800  ceFAZolin (ANCEF) IVPB 1 g/50 mL premix  Status:  Discontinued    Note to Pharmacy: Send with pt to OR   1 g 100 mL/hr over 30 Minutes Intravenous On call 04/25/19 1342 04/25/19 1502     Subjective: Seen and examined at bedside in the dialysis unit and states that he is doing okay.  States he did not get  hemodialysis last night.  No chest pain, lightheadedness or dizziness.  No nausea or vomiting and states he feels okay.  No other concerns complaints at this time.  Objective: Vitals:   04/27/19 1145 04/27/19 1215 04/27/19 1245 04/27/19 1351  BP: (!) 144/95 (!) 155/99 (!) 164/96 (!) 158/95  Pulse: 75 83 80 82  Resp:   19 20  Temp:   (!) 97.2 F (36.2 C) 98.2 F (36.8 C)  TempSrc:   Oral Oral  SpO2:    100%  Weight:   97.5 kg   Height:        Intake/Output Summary (Last 24 hours) at 04/27/2019 1616 Last data filed at 04/27/2019 1245 Gross per 24 hour  Intake 100 ml  Output 950 ml  Net -850 ml   Filed Weights   04/27/19 0600 04/27/19 1035 04/27/19 1245  Weight: 96.8 kg 97.9 kg 97.5 kg   Examination: Physical Exam:  Constitutional: Well-nourished, well-developed obese African-American male currently no acute distress appears calm sitting in the bed getting dialysis through his right IJ TDC Eyes: Lids and conjunctive are normal.  Sclera anicteric ENMT: External ears and nose appear normal.  Grossly normal hearing Neck: Appears supple with no JVD Respiratory: Slightly diminished to auscultation bilaterally with no appreciable wheezing, rales, rhonchi.  Patient not tachypneic or using any accessory muscles to breathe Cardiovascular: Regular rate and rhythm.  No appreciable murmurs, rubs, gallops.  Has 1+  lower extremity edema noted Abdomen: Soft, nontender, nondistended.  Bowel sounds present. GU: Deferred Musculoskeletal: No contractures or cyanosis.Marland Kitchen  Has a right IJ TDC and a left forearm loop Gore-Tex graft noted Skin: Skin is warm and dry no appreciable rashes or lesions lymph disc evaluation but has a left arm loop Gore-Tex graft noted.  And a right IJ [facility] Neurologic: Cranial nerves II through XII grossly intact no appreciable focal deficits. Psychiatric: Pleasant mood and affect.  Intact judgment and insight.  He is awake, alert, oriented  Data Reviewed: I have personally reviewed following labs and imaging studies  CBC: Recent Labs  Lab 04/23/19 0649 04/23/19 1452 04/25/19 0513 04/26/19 0443 04/27/19 0309  WBC 7.8 6.2 8.1 8.0 11.0*  NEUTROABS  --   --  4.5 4.6 8.9*  HGB 8.5* 8.5* 7.9* 8.4* 8.4*  HCT 26.4* 25.6* 23.7* 25.7* 25.1*  MCV 94.0 90.8 90.5 92.1 91.6  PLT 150 131* 174 167 829   Basic Metabolic Panel: Recent Labs  Lab 04/22/19 0615 04/23/19 0649 04/23/19 1452 04/24/19 0416 04/25/19 0513 04/26/19 0443 04/27/19 0309  NA 138 136  --  136 136 139 139  K 4.5 4.6  --  4.9 4.8 4.4 4.7  CL 108 105  --  108 105 106 109  CO2 20* 20*  --  19* 19* 21* 19*  GLUCOSE 92 81  --  95 79 85 131*  BUN 59* 70*  --  70* 74* 78* 83*  CREATININE 5.56* 6.42* 6.31* 6.09* 6.19* 6.98* 6.47*  CALCIUM 8.9 8.9  --  8.7* 8.9 8.7* 8.7*  MG  --  1.6*  --   --  2.1 2.0 1.9  PHOS 4.8*  --   --  5.4* 5.2* 5.4* 4.7*   GFR: Estimated Creatinine Clearance: 11 mL/min (A) (by C-G formula based on SCr of 6.47 mg/dL (H)). Liver Function Tests: Recent Labs  Lab 04/24/19 0416 04/25/19 0513 04/26/19 0443 04/27/19 0309  AST  --  19 15 16   ALT  --  11 11 10   ALKPHOS  --  47 50 44  BILITOT  --  0.6 0.5 0.3  PROT  --  7.5 7.8 7.3  ALBUMIN 3.1* 3.4* 3.3* 3.0*   No results for input(s): LIPASE, AMYLASE in the last 168 hours. No results for input(s): AMMONIA in the last 168 hours. Coagulation  Profile: Recent Labs  Lab 04/23/19 0649 04/24/19 0416 04/25/19 0513 04/26/19 0443 04/27/19 0309  INR 1.4* 1.4* 1.2 1.2 1.3*   Cardiac Enzymes: No results for input(s): CKTOTAL, CKMB, CKMBINDEX, TROPONINI in the last 168 hours. BNP (last 3 results) No results for input(s): PROBNP in the last 8760 hours. HbA1C: No results for input(s): HGBA1C in the last 72 hours. CBG: Recent Labs  Lab 04/26/19 2128 04/27/19 0800 04/27/19 1354 04/27/19 1427 04/27/19 1609  GLUCAP 127* 176* 63* 93 216*   Lipid Profile: No results for input(s): CHOL, HDL, LDLCALC, TRIG, CHOLHDL, LDLDIRECT in the last 72 hours. Thyroid Function Tests: No results for input(s): TSH, T4TOTAL, FREET4, T3FREE, THYROIDAB in the last 72 hours. Anemia Panel: No results for input(s): VITAMINB12, FOLATE, FERRITIN, TIBC, IRON, RETICCTPCT in the last 72 hours. Sepsis Labs: No results for input(s): PROCALCITON, LATICACIDVEN in the last 168 hours.  Recent Results (from the past 240 hour(s))  SARS Coronavirus 2 (CEPHEID - Performed in [place] hospital lab), Hosp Order     Status: None   Collection Time: 04/19/19  2:08 PM   Specimen: Nasopharyngeal Swab  Result Value Ref Range Status   SARS Coronavirus 2 NEGATIVE NEGATIVE Final    Comment: (NOTE) If result is NEGATIVE SARS-CoV-2 target nucleic acids are NOT DETECTED. The SARS-CoV-2 RNA is generally detectable in upper and lower  respiratory specimens during the acute phase of infection. The lowest  concentration of SARS-CoV-2 viral copies this assay can detect is 250  copies / mL. A negative result does not preclude SARS-CoV-2 infection  and should not be used as the sole basis for treatment or other  patient management decisions.  A negative result may occur with  improper specimen collection / handling, submission of specimen other  than nasopharyngeal swab, presence of viral mutation(s) within the  areas targeted by this assay, and inadequate number of viral  copies  (<250 copies / mL). A negative result must be combined with clinical  observations, patient history, and epidemiological information. If result is POSITIVE SARS-CoV-2 target nucleic acids are DETECTED. The SARS-CoV-2 RNA is generally detectable in upper and lower  respiratory specimens dur ing the acute phase of infection.  Positive  results are indicative of active infection with SARS-CoV-2.  Clinical  correlation with patient history and other diagnostic information is  necessary to determine patient infection status.  Positive results do  not rule out bacterial infection or co-infection with other viruses. If result is PRESUMPTIVE POSTIVE SARS-CoV-2 nucleic acids MAY BE PRESENT.   A presumptive positive result was obtained on the submitted specimen  and confirmed on repeat testing.  While 2019 novel coronavirus  (SARS-CoV-2) nucleic acids may be present in the submitted sample  additional confirmatory testing may be necessary for epidemiological  and / or clinical management purposes  to differentiate between  SARS-CoV-2 and other Sarbecovirus currently known to infect humans.  If clinically indicated additional testing with an alternate test  methodology [phone]) is advised. The SARS-CoV-2 RNA is generally  detectable in upper and lower respiratory sp ecimens during the acute  phase of infection. The expected result is Negative. Fact Sheet for Patients:  StrictlyIdeas.no Fact Sheet for Healthcare Providers: BankingDealers.co.za This  test is not yet approved or cleared by the [place] and has been authorized for detection and/or diagnosis of SARS-CoV-2 by FDA under an Emergency Use Authorization (EUA).  This EUA will remain in effect (meaning this test can be used) for the duration of the COVID-19 declaration under Section 564(b)(1) of the Act, 21 U.S.C. section 360bbb-3(b)(1), unless the authorization is terminated  or revoked sooner. Performed at [facility], [place] [address], [place], [place] [address]   Surgical PCR screen     Status: None   Collection Time: 04/26/19  6:12 AM   Specimen: Nasal Mucosa; Nasal Swab  Result Value Ref Range Status   MRSA, PCR NEGATIVE NEGATIVE Final   Staphylococcus aureus NEGATIVE NEGATIVE Final    Comment: (NOTE) The Xpert SA Assay (FDA approved for NASAL specimens in patients 54 years of age and older), is one component of a comprehensive surveillance program. It is not intended to diagnose infection nor to guide or monitor treatment. Performed at [facility], [place] [address], [place], [place] [address]     Radiology Studies: X-ray Chest Pa Or Ap  Result Date: 04/26/2019 CLINICAL DATA:  Encounter for central line placement. EXAM: CHEST  1 VIEW COMPARISON:  04/25/2019 FINDINGS: Right jugular dialysis catheter has been placed. Catheter tip is at the junction of the SVC and right atrium. Enlargement of the cardiac silhouette without pulmonary edema. Negative for a pneumothorax. The trachea is midline. IMPRESSION: Placement of right jugular dialysis catheter. Catheter tip at the SVC and right atrium junction. Negative for a pneumothorax. Enlargement of the cardiac silhouette that may be accentuated by the technique. Electronically Signed   By: Markus Daft M.D.   On: 04/26/2019 12:53   Dg Fluoro Guide Cv Line-no Report  Result Date: 04/26/2019 Fluoroscopy was utilized by the requesting physician.  No radiographic interpretation.    Scheduled Meds: . allopurinol  100 mg Oral BID  . amLODipine  5 mg Oral Daily  . aspirin EC  81 mg Oral Daily  . atorvastatin  40 mg Oral QHS  . calcitRIOL  0.5 mcg Oral Daily  . carvedilol  6.25 mg Oral BID WC  . Chlorhexidine Gluconate Cloth  6 each Topical Q0600  . cholecalciferol  1,000 Units Oral Daily  . docusate sodium  100 mg Oral Daily  . ferrous gluconate  324 mg Oral Q M,W,F  . gabapentin  400 mg  Oral QHS  . heparin  5,000 Units Subcutaneous Q8H  . insulin aspart  0-5 Units Subcutaneous QHS  . insulin aspart  0-9 Units Subcutaneous TID WC  . isosorbide-hydrALAZINE  1 tablet Oral TID  . losartan  100 mg Oral Daily  . mouth rinse  15 mL Mouth Rinse BID  . pantoprazole  40 mg Oral Daily  . sodium chloride flush  3 mL Intravenous Q12H  . terazosin  5 mg Oral q morning - 10a   Continuous Infusions: . sodium chloride    . sodium chloride    . sodium chloride 10 mL/hr at 04/26/19 9833     LOS: 6 days   Kerney Elbe, DO [facility] PAGER is on [place]  If 7PM-7AM, please contact night-coverage [email or web address] Password [email or web address] 04/27/2019, 4:16 PM

## 2019-04-27 NOTE — Procedures (Signed)
Patient was seen on dialysis and the procedure was supervised.  BFR 200  Via TDC BP is  158/88.   Patient appears to be tolerating treatment well  [name] 04/27/2019

## 2019-04-27 NOTE — Progress Notes (Signed)
Patient ID: [name], male   DOB: August 10, 1941, 78 y.o.   MRN: [id number] Comfortable.  Did not have hemodialysis yesterday. Denies any discomfort in his arm.  Antecubital and forearm incisions healing.  Excellent thrill in his graft. Stable postop day 1 from right IJ tunneled catheter and left forearm loop AV graft.  Will not follow actively.  Does not need any office follow-up with [place].  Please call if we can assist

## 2019-04-27 NOTE — Care Management Important Message (Signed)
Important Message  Patient Details  Name: [name] MRN: [id number] Date of Birth: [birth year]   Medicare Important Message Given:  Yes     Shelda Altes 04/27/2019, 1:50 PM

## 2019-04-28 ENCOUNTER — Inpatient Hospital Stay (HOSPITAL_COMMUNITY): Payer: No Typology Code available for payment source

## 2019-04-28 LAB — HEPATITIS B SURFACE ANTIBODY,QUALITATIVE: Hep B S Ab: REACTIVE

## 2019-04-28 LAB — GLUCOSE, CAPILLARY: Glucose-Capillary: 104 mg/dL — ABNORMAL HIGH (ref 70–99)

## 2019-04-28 LAB — HEPATITIS B CORE ANTIBODY, TOTAL: Hep B Core Total Ab: POSITIVE — AB

## 2019-04-28 MED ORDER — ISOSORB DINITRATE-HYDRALAZINE 20-37.5 MG PO TABS: 1.0000 | ORAL_TABLET | Freq: Three times a day (TID) | ORAL | 1 refills | Status: DC

## 2019-04-28 MED ORDER — CARVEDILOL 6.25 MG PO TABS: 6.2500 mg | ORAL_TABLET | Freq: Two times a day (BID) | ORAL | 1 refills | Status: DC

## 2019-04-28 NOTE — Progress Notes (Addendum)
OT Cancellation Note  Patient Details Name: [name] MRN: [id number] DOB: November 09, 1940   Cancelled Treatment:    Reason Eval/Treat Not Completed: Other (comment). Pt dressed with bags ready, walking hall and eagerly awaiting d/c. States that he feels a little more winded with activity than usual. States that his ride is here, w/c arrived as OT talking with pt.   Tyrone Schimke, OT Acute Rehabilitation Services Pager: [phone] Office: [phone]  04/28/2019, 9:05 AM

## 2019-04-28 NOTE — TOC Transition Note (Signed)
Transition of Care [facility]) - CM/SW Discharge Note   Patient Details  Name: [name] MRN: [id number] Date of Birth: [birth year]  [facility] CM/SW Contact:  Pollie Friar, RN Phone Number: 04/28/2019, 12:14 PM   Clinical Narrative:    Pt discharged home with self care. Pt has hospital f/u, insurance and transportation home.   Final next level of care: Home/Self Care Barriers to Discharge: No Barriers Identified   Patient Goals and CMS Choice        Discharge Placement                       Discharge Plan and Services                                     Social Determinants of Health (SDOH) Interventions     Readmission Risk Interventions No flowsheet data found.

## 2019-04-28 NOTE — Discharge Summary (Signed)
Physician Discharge Summary  [name] IWP:[id number] DOB: [birth year] DOA: 04/19/2019  PCP: No primary care provider on file.  Admit date: 04/19/2019 Discharge date: 04/28/2019  Admitted From: Home Disposition:  Home  Discharge Condition:Stable CODE STATUS:FULL Diet recommendation: Heart Healthy   Brief/Interim Summary:  Patient is a  [name] is a 78 year old African-American obese male with a past medical history significant for but limited to hypertension, prostate cancer, CKD stage V, coronary artery disease, type 2 diabetes mellitus and treatment and was gout, history of acute DVT currently on Coumadin and other comorbidities who presents to the emergency room with a history of gradual progressive shortness of breath for about a week.  According to the patient for the last 1 to 2 weeks he become more more progressively short of breath and gradually worsening symptoms with occasional wheezing no cough or fever and this was more so at nighttime without any chest pain.  He was worked up and found to have a low EF and a nuclear stress test was then done which showed moderate sized area of anterior lateral wall ischemia at the mid and basal level with apical thinning.  There is also diffuse hypokinesis worse in the apex and he was transferred from [facility] to [facility] for cardiac catheterization which showed medical disease and mentation was for medical management for LAD and PAD disease.  Cardiology recommending medical therapy with aspirin, carvedilol, atorvastatin.  Patient CKD stage V worsened and nephrology was following and recommended initiation of dialysis.patient underwent  vein mapping with placement of temporary dialysis catheter . Patient started on dialysis and  and a spot was secured for him at [facility] kidney center on Tuesday Thursday Saturday at 1140 starting on 04/29/2019. Patient was hemodynamically stable for discharge to home today.  Following problems were  addressed during his hospitalization :  Dyspnea on exertion likely secondary to Chronic systolic CHF along with Cardiomyopathy, slightly improved -Status post 2 doses of IV Lasix 60 mg and has not been repeated yet -Work-up for his cardiomyopathy and cardiology feels his cardiomyopathy is out of proportion to his CAD as patient underwent a cardiac catheterization yesterday.  -Cardiology recommending continue aspirin, carvedilol and statin -Nephrology recommending initiation of dialysis and patient is going for vein mapping -Further care per Nephrology and Cardiology -We will continue DuoNeb every 2 hours PRN as well as isosorbide-hydralazine 1 tab p.o. 3 times daily for heart failure along with nitroglycerin -Repeat chest x-ray yesterday afternoon showed "Right jugular dialysis catheter has been placed. Catheter tip is at the junction of the SVC and right atrium. Enlargement of the cardiac silhouette without pulmonary edema. Negative for a pneumothorax. The trachea is midline." -Further volume maintenance to be initiated via dialysis through his [facility] and patient will need clipping to a dialysis center; now has a bed at [facility] kidney center on Tuesday Thursday Saturday however will likely discharge in the a.m.  Acute on Chronic Systolic CHF -Been given diuresis x2 with IV Lasix but now nephrology is recommending PRN Lasix -Continue with isosorbide hydralazine as well as carvedilol -Strict I's and O's and daily weights; patient is - 6.738 L since admission and is down 4 pounds since admission -Further volume management per dialysis managed by nephrology  CKD stage V Hyperphosphatemia in the setting of worsening renal dysfunction Metabolic acidosis in the setting of CKD -In the setting of likely hypertension diabetes -Patient's BUN/creatinine today was 83/6.47 -Nephrology initially recommending IV Lasix but now well continue volume maintenance with hemodialysis  -  Further care per  nephrology -Continue with serum bicarbonate 650 mg p.o. daily continue with calcitriol 0.25 mg p.o. every Monday Wednesday Friday as well as cholecalciferol 1000 units p.o. daily -Patient was also given a dose of Feraheme -Nephrology consulted vascular surgery and vein mapping was to be done. -Vascular surgery evaluated and discussed options with the patient understands agrees to proceed and the duplex shows Small surface veins bilaterally no image with SonoSite ultrasound at the time of surgery but suspect require Gore-Tex graft and they will place a tunneled catheter in the left arm access for tomorrow -Patient is status post temporary dialysis catheter in the right IJ and a left forearm loop AV Gore-Tex graft on 04/26/2019  -Started on dialysis .  Outpatient dialysis set up  History of CAD with PCI to LAD in 2012 -Continue with aspirin, statin, and carvedilol -Cardiac catheterization as below -Cardiology recommends no further cardiovascular testing indicated and continuing aggressive risk reduction and meds as listed  Diabetes Mellitus Type 2 -At home is diet controlled -Last hemoglobin A1c was 5.6  Anemia chronic kidney disease in the setting of stage V CKD -Patient's hemoglobin/hematocrit stable -Patient got a dose of Feraheme by nephrology -Continue with ferrous gluconate 324 mg p.o. every Monday Wednesday Friday  OSA -Continue CPAP  History of DVT -Had an infrapopliteal DVT in September 2019 and has had 10 months of anticoagulation and cardiology recommends no longer taking warfarin now as he has had 10 months of treatment  Thrombocytopenia, improved -Stable.  Hypertension -Continue with carvedilol as well as isosorbide-hydralazine -Also continue losartan 100 mg p.o. daily   Hyperlipidemia -Continue atorvastatin 40 mg p.o. nightly  Obesity -Estimated body mass index is 32.68 kg/m -Weight Loss and Dietary Counseling given   History of prostate cancer -Continue  and follow with Dr. [name] in outpatient setting -Continue with terazosin 5 mg p.o. every morning  SVT -Resolved. Continue Coreg.  Discharge Diagnoses:  Principal Problem:   Shortness of breath Active Problems:   Coronary artery disease   Hypertension   Chronic kidney disease   Chest pain   Acute combined systolic and diastolic heart failure (HCC)   CKD (chronic kidney disease) stage 5, GFR less than 15 ml/min (HCC)   NSVT (nonsustained ventricular tachycardia) (HCC)   LBBB (left bundle branch block)   Class 2 severe obesity due to excess calories with serious comorbidity and body mass index (BMI) of 38.0 to 38.9 in adult ([place])   Anemia   End stage renal disease ([place])    Discharge Instructions  Discharge Instructions    Diet - low sodium heart healthy   Complete by: As directed    Discharge instructions   Complete by: As directed    1)Take prescribed medicines as instructed. 2)Follow up with your PCP in a week. 3)Follow at [facility] on Tuesday,thursday ,Saturday schedule  at 11:40 .Can start dialysis  there on Thursday 8/6 4)Folow up with cardiology as an outpatient.   Increase activity slowly   Complete by: As directed      Allergies as of 04/28/2019   No Known Allergies     Medication List    STOP taking these medications   warfarin 5 MG tablet Commonly known as: COUMADIN     TAKE these medications   allopurinol 100 MG tablet Commonly known as: ZYLOPRIM Take 100 mg by mouth 2 (two) times daily.   amLODipine 5 MG tablet Commonly known as: NORVASC Take 5 mg by mouth daily.  aspirin EC 81 MG tablet Take 81 mg by mouth daily.   atorvastatin 40 MG tablet Commonly known as: LIPITOR Take 40 mg by mouth at bedtime.   calcitRIOL 0.25 MCG capsule Commonly known as: ROCALTROL Take 0.5 mcg by mouth daily.   carvedilol 6.25 MG tablet Commonly known as: COREG Take 1 tablet (6.25 mg total) by mouth 2 (two) times daily with a meal.    cholecalciferol 1000 units tablet Commonly known as: VITAMIN D Take 1,000 Units by mouth daily.   docusate sodium 100 MG capsule Commonly known as: COLACE Take 100 mg by mouth daily.   ferrous gluconate 324 MG tablet Commonly known as: FERGON Take 324 mg by mouth every Monday, Wednesday, and Friday.   gabapentin 400 MG capsule Commonly known as: NEURONTIN Take 400 mg by mouth at bedtime.   isosorbide-hydrALAZINE 20-37.5 MG tablet Commonly known as: BIDIL Take 1 tablet by mouth 3 (three) times daily.   losartan 100 MG tablet Commonly known as: COZAAR Take 100 mg by mouth daily.   nitroGLYCERIN 0.4 MG SL tablet Commonly known as: NITROSTAT Place 0.4 mg under the tongue every 5 (five) minutes as needed for chest pain. For chest pain   omeprazole 20 MG capsule Commonly known as: PRILOSEC Take 20 mg by mouth daily.   terazosin 5 MG capsule Commonly known as: HYTRIN Take 5 mg by mouth every morning.      Follow-up Information    [facility].   Specialty: Vascular Surgery Why: As needed Contact information: [address] [place]. Schedule an appointment as soon as possible for a visit in 1 week(s).   Specialty: General Practice Contact information: [place] [id number] [phone]          No Known Allergies  Consultations:  Cardiology,nephrology   Procedures/Studies: X-ray Chest Pa Or Ap  Result Date: 04/26/2019 CLINICAL DATA:  Encounter for central line placement. EXAM: CHEST  1 VIEW COMPARISON:  04/25/2019 FINDINGS: Right jugular dialysis catheter has been placed. Catheter tip is at the junction of the SVC and right atrium. Enlargement of the cardiac silhouette without pulmonary edema. Negative for a pneumothorax. The trachea is midline. IMPRESSION: Placement of right jugular dialysis catheter. Catheter tip at the SVC and right atrium  junction. Negative for a pneumothorax. Enlargement of the cardiac silhouette that may be accentuated by the technique. Electronically Signed   By: Markus Daft M.D.   On: 04/26/2019 12:53   Dg Chest 2 View  Result Date: 04/19/2019 CLINICAL DATA:  Short of breath EXAM: CHEST - 2 VIEW COMPARISON:  03/15/2019 FINDINGS: Cardiac enlargement without heart failure or edema. No infiltrate or effusion. Lungs remain hyperinflated and unchanged from the prior study. IMPRESSION: Cardiac enlargement without acute abnormality. Electronically Signed   By: Franchot Gallo M.D.   On: 04/19/2019 15:43   Nm Myocar Multi W/spect W/wall Motion / Ef  Result Date: 04/21/2019  There was no ST segment deviation noted during stress.  No T wave inversion was noted during stress.  Findings consistent with ischemia.  This is a high risk study.  The left ventricular ejection fraction is moderately decreased (30-44%).  Moderate sized area of anterolateral wall ischemia at mid and basal level with apical thinning Diffuse hypokinesis worse in the apex EF 31%   Dg Chest Port 1 View  Result Date: 04/25/2019 CLINICAL DATA:  Shortness of breath EXAM: PORTABLE CHEST 1  VIEW COMPARISON:  April 19, 2019 FINDINGS: The mediastinal contour is normal. The heart size is enlarged. Mild hazy opacity of bilateral lung bases are identified. There is no pulmonary edema or pleural effusion. IMPRESSION: Mild hazy opacity of bilateral lungs. This is nonspecific. Viral pneumonia is not excluded. Electronically Signed   By: Abelardo Diesel M.D.   On: 04/25/2019 10:53   Dg Fluoro Guide Cv Line-no Report  Result Date: 04/26/2019 Fluoroscopy was utilized by the requesting physician.  No radiographic interpretation.   Vas [place] Upper Ext Vein Mapping (pre-op Avf)  Result Date: 04/26/2019 UPPER EXTREMITY VEIN MAPPING  Indications: Pre-dialysis access. Limitations: IV in left arm, tissue disturbance in bilateral forearms and right              AC make it difficult  to see anatomy Comparison Study: No prior study on file for comparison Performing Technologist: Sharion Dove RVS  Examination Guidelines: A complete evaluation includes B-mode imaging, spectral Doppler, color Doppler, and power Doppler as needed of all accessible portions of each vessel. Bilateral testing is considered an integral part of a complete examination. Limited examinations for reoccurring indications may be performed as noted. +-----------------+-------------+----------+--------------+ Right Cephalic   Diameter (cm)Depth (cm)   Findings    +-----------------+-------------+----------+--------------+ Prox upper arm                          not visualized +-----------------+-------------+----------+--------------+ Mid upper arm        0.21        2.60                  +-----------------+-------------+----------+--------------+ Dist upper arm       0.27        1.98                  +-----------------+-------------+----------+--------------+ Antecubital fossa    0.26        0.14                  +-----------------+-------------+----------+--------------+ Prox forearm         0.20        1.53                  +-----------------+-------------+----------+--------------+ Mid forearm                             not visualized +-----------------+-------------+----------+--------------+ Wrist                                   not visualized +-----------------+-------------+----------+--------------+ +-----------------+-------------+----------+--------------+ Right Basilic    Diameter (cm)Depth (cm)   Findings    +-----------------+-------------+----------+--------------+ Prox upper arm       0.39        0.78                  +-----------------+-------------+----------+--------------+ Mid upper arm        0.24        0.70                  +-----------------+-------------+----------+--------------+ Dist upper arm                          not visualized  +-----------------+-------------+----------+--------------+ Antecubital fossa  not visualized +-----------------+-------------+----------+--------------+ Prox forearm         0.35        2.57                  +-----------------+-------------+----------+--------------+ Mid forearm          0.23        1.85                  +-----------------+-------------+----------+--------------+ Wrist                0.16        1.40                  +-----------------+-------------+----------+--------------+ +-----------------+-------------+----------+--------------+ Left Cephalic    Diameter (cm)Depth (cm)   Findings    +-----------------+-------------+----------+--------------+ Prox upper arm                          not visualized +-----------------+-------------+----------+--------------+ Mid upper arm        0.18        2.11                  +-----------------+-------------+----------+--------------+ Dist upper arm       0.20        1.93     branching    +-----------------+-------------+----------+--------------+ Antecubital fossa    0.21        1.50                  +-----------------+-------------+----------+--------------+ Prox forearm                            not visualized +-----------------+-------------+----------+--------------+ Mid forearm                             not visualized +-----------------+-------------+----------+--------------+ Wrist                                   not visualized +-----------------+-------------+----------+--------------+ +-----------------+-------------+----------+--------------+ Left Basilic     Diameter (cm)Depth (cm)   Findings    +-----------------+-------------+----------+--------------+ Prox upper arm                          not visualized +-----------------+-------------+----------+--------------+ Mid upper arm                           not visualized  +-----------------+-------------+----------+--------------+ Dist upper arm                          not visualized +-----------------+-------------+----------+--------------+ Antecubital fossa                       not visualized +-----------------+-------------+----------+--------------+ Prox forearm                            not visualized +-----------------+-------------+----------+--------------+ Mid forearm                             not visualized +-----------------+-------------+----------+--------------+ Wrist  not visualized +-----------------+-------------+----------+--------------+ *See table(s) above for measurements and observations.   Diagnosing physician: Ruta Hinds MD Electronically signed by Ruta Hinds MD on 04/26/2019 at 4:04:19 PM.    Final        Subjective:  Patient seen and examined the bedside this morning.  He was eager for discharge.  He was found to be hemodynamically stable during my evaluation today.  He did not have any questions or concerns.  Stable for discharge  Discharge Exam: Vitals:   04/27/19 1942 04/28/19 0633  BP: (!) 95/52 137/80  Pulse: 70 75  Resp: 20 18  Temp: 98.1 F (36.7 C) 98.4 F (36.9 C)  SpO2: 97% 98%   Vitals:   04/27/19 1245 04/27/19 1351 04/27/19 1942 04/28/19 0633  BP: (!) 164/96 (!) 158/95 (!) 95/52 137/80  Pulse: 80 82 70 75  Resp: 19 20 20 18   Temp: (!) 97.2 F (36.2 C) 98.2 F (36.8 C) 98.1 F (36.7 C) 98.4 F (36.9 C)  TempSrc: Oral Oral Oral Oral  SpO2:  100% 97% 98%  Weight: 97.5 kg   99 kg  Height:        General: Pt is alert, awake, not in acute distress Cardiovascular: RRR, S1/S2 +, no rubs, no gallops Respiratory: CTA bilaterally, no wheezing, no rhonchi Abdominal: Soft, NT, ND, bowel sounds + Extremities: no edema, no cyanosis    The results of significant diagnostics from this hospitalization (including imaging, microbiology, ancillary and  laboratory) are listed below for reference.     Microbiology: Recent Results (from the past 240 hour(s))  SARS Coronavirus 2 (CEPHEID - Performed in [facility] hospital lab), Hosp Order     Status: None   Collection Time: 04/19/19  2:08 PM   Specimen: Nasopharyngeal Swab  Result Value Ref Range Status   SARS Coronavirus 2 NEGATIVE NEGATIVE Final    Comment: (NOTE) If result is NEGATIVE SARS-CoV-2 target nucleic acids are NOT DETECTED. The SARS-CoV-2 RNA is generally detectable in upper and lower  respiratory specimens during the acute phase of infection. The lowest  concentration of SARS-CoV-2 viral copies this assay can detect is 250  copies / mL. A negative result does not preclude SARS-CoV-2 infection  and should not be used as the sole basis for treatment or other  patient management decisions.  A negative result may occur with  improper specimen collection / handling, submission of specimen other  than nasopharyngeal swab, presence of viral mutation(s) within the  areas targeted by this assay, and inadequate number of viral copies  (<250 copies / mL). A negative result must be combined with clinical  observations, patient history, and epidemiological information. If result is POSITIVE SARS-CoV-2 target nucleic acids are DETECTED. The SARS-CoV-2 RNA is generally detectable in upper and lower  respiratory specimens dur ing the acute phase of infection.  Positive  results are indicative of active infection with SARS-CoV-2.  Clinical  correlation with patient history and other diagnostic information is  necessary to determine patient infection status.  Positive results do  not rule out bacterial infection or co-infection with other viruses. If result is PRESUMPTIVE POSTIVE SARS-CoV-2 nucleic acids MAY BE PRESENT.   A presumptive positive result was obtained on the submitted specimen  and confirmed on repeat testing.  While 2019 novel coronavirus  (SARS-CoV-2) nucleic acids may  be present in the submitted sample  additional confirmatory testing may be necessary for epidemiological  and / or clinical management purposes  to differentiate between  SARS-CoV-2 and other  Sarbecovirus currently known to infect humans.  If clinically indicated additional testing with an alternate test  methodology [phone]) is advised. The SARS-CoV-2 RNA is generally  detectable in upper and lower respiratory sp ecimens during the acute  phase of infection. The expected result is Negative. Fact Sheet for Patients:  StrictlyIdeas.no Fact Sheet for Healthcare Providers: BankingDealers.co.za This test is not yet approved or cleared by the [place] FDA and has been authorized for detection and/or diagnosis of SARS-CoV-2 by FDA under an Emergency Use Authorization (EUA).  This EUA will remain in effect (meaning this test can be used) for the duration of the COVID-19 declaration under Section 564(b)(1) of the Act, 21 U.S.C. section 360bbb-3(b)(1), unless the authorization is terminated or revoked sooner. Performed at [facility], [place] [address] [place], [place] [address]   Surgical PCR screen     Status: None   Collection Time: 04/26/19  6:12 AM   Specimen: Nasal Mucosa; Nasal Swab  Result Value Ref Range Status   MRSA, PCR NEGATIVE NEGATIVE Final   Staphylococcus aureus NEGATIVE NEGATIVE Final    Comment: (NOTE) The Xpert SA Assay (FDA approved for NASAL specimens in patients 41 years of age and older), is one component of a comprehensive surveillance program. It is not intended to diagnose infection nor to guide or monitor treatment. Performed at [facility], [place] [address], [place], [place] [address]      Labs: BNP (last 3 results) No results for input(s): BNP in the last 8760 hours. Basic Metabolic Panel: Recent Labs  Lab 04/22/19 0615 04/23/19 0649 04/23/19 1452 04/24/19 0416  04/25/19 0513 04/26/19 0443 04/27/19 0309  NA 138 136  --  136 136 139 139  K 4.5 4.6  --  4.9 4.8 4.4 4.7  CL 108 105  --  108 105 106 109  CO2 20* 20*  --  19* 19* 21* 19*  GLUCOSE 92 81  --  95 79 85 131*  BUN 59* 70*  --  70* 74* 78* 83*  CREATININE 5.56* 6.42* 6.31* 6.09* 6.19* 6.98* 6.47*  CALCIUM 8.9 8.9  --  8.7* 8.9 8.7* 8.7*  MG  --  1.6*  --   --  2.1 2.0 1.9  PHOS 4.8*  --   --  5.4* 5.2* 5.4* 4.7*   Liver Function Tests: Recent Labs  Lab 04/24/19 0416 04/25/19 0513 04/26/19 0443 04/27/19 0309  AST  --  19 15 16   ALT  --  11 11 10   ALKPHOS  --  47 50 44  BILITOT  --  0.6 0.5 0.3  PROT  --  7.5 7.8 7.3  ALBUMIN 3.1* 3.4* 3.3* 3.0*   No results for input(s): LIPASE, AMYLASE in the last 168 hours. No results for input(s): AMMONIA in the last 168 hours. CBC: Recent Labs  Lab 04/23/19 0649 04/23/19 1452 04/25/19 0513 04/26/19 0443 04/27/19 0309  WBC 7.8 6.2 8.1 8.0 11.0*  NEUTROABS  --   --  4.5 4.6 8.9*  HGB 8.5* 8.5* 7.9* 8.4* 8.4*  HCT 26.4* 25.6* 23.7* 25.7* 25.1*  MCV 94.0 90.8 90.5 92.1 91.6  PLT 150 131* 174 167 164   Cardiac Enzymes: No results for input(s): CKTOTAL, CKMB, CKMBINDEX, TROPONINI in the last 168 hours. BNP: Invalid input(s): POCBNP CBG: Recent Labs  Lab 04/27/19 1354 04/27/19 1427 04/27/19 1609 04/27/19 2141 04/28/19 0736  GLUCAP 63* 93 216* 118* 104*   D-Dimer No results for input(s): DDIMER in the last  72 hours. Hgb A1c No results for input(s): HGBA1C in the last 72 hours. Lipid Profile No results for input(s): CHOL, HDL, LDLCALC, TRIG, CHOLHDL, LDLDIRECT in the last 72 hours. Thyroid function studies No results for input(s): TSH, T4TOTAL, T3FREE, THYROIDAB in the last 72 hours.  Invalid input(s): FREET3 Anemia work up No results for input(s): VITAMINB12, FOLATE, FERRITIN, TIBC, IRON, RETICCTPCT in the last 72 hours. Urinalysis    Component Value Date/Time   COLORURINE YELLOW 03/15/2019 1644   APPEARANCEUR  CLEAR 03/15/2019 1644   LABSPEC 1.013 03/15/2019 1644   PHURINE 5.0 03/15/2019 1644   GLUCOSEU 50 (A) 03/15/2019 1644   HGBUR SMALL (A) 03/15/2019 1644   BILIRUBINUR NEGATIVE 03/15/2019 1644   KETONESUR NEGATIVE 03/15/2019 1644   PROTEINUR >=300 (A) 03/15/2019 1644   NITRITE NEGATIVE 03/15/2019 1644   LEUKOCYTESUR NEGATIVE 03/15/2019 1644   Sepsis Labs Invalid input(s): PROCALCITONIN,  WBC,  LACTICIDVEN Microbiology Recent Results (from the past 240 hour(s))  SARS Coronavirus 2 (CEPHEID - Performed in [place] hospital lab), Hosp Order     Status: None   Collection Time: 04/19/19  2:08 PM   Specimen: Nasopharyngeal Swab  Result Value Ref Range Status   SARS Coronavirus 2 NEGATIVE NEGATIVE Final    Comment: (NOTE) If result is NEGATIVE SARS-CoV-2 target nucleic acids are NOT DETECTED. The SARS-CoV-2 RNA is generally detectable in upper and lower  respiratory specimens during the acute phase of infection. The lowest  concentration of SARS-CoV-2 viral copies this assay can detect is 250  copies / mL. A negative result does not preclude SARS-CoV-2 infection  and should not be used as the sole basis for treatment or other  patient management decisions.  A negative result may occur with  improper specimen collection / handling, submission of specimen other  than nasopharyngeal swab, presence of viral mutation(s) within the  areas targeted by this assay, and inadequate number of viral copies  (<250 copies / mL). A negative result must be combined with clinical  observations, patient history, and epidemiological information. If result is POSITIVE SARS-CoV-2 target nucleic acids are DETECTED. The SARS-CoV-2 RNA is generally detectable in upper and lower  respiratory specimens dur ing the acute phase of infection.  Positive  results are indicative of active infection with SARS-CoV-2.  Clinical  correlation with patient history and other diagnostic information is  necessary to  determine patient infection status.  Positive results do  not rule out bacterial infection or co-infection with other viruses. If result is PRESUMPTIVE POSTIVE SARS-CoV-2 nucleic acids MAY BE PRESENT.   A presumptive positive result was obtained on the submitted specimen  and confirmed on repeat testing.  While 2019 novel coronavirus  (SARS-CoV-2) nucleic acids may be present in the submitted sample  additional confirmatory testing may be necessary for epidemiological  and / or clinical management purposes  to differentiate between  SARS-CoV-2 and other Sarbecovirus currently known to infect humans.  If clinically indicated additional testing with an alternate test  methodology [phone]) is advised. The SARS-CoV-2 RNA is generally  detectable in upper and lower respiratory sp ecimens during the acute  phase of infection. The expected result is Negative. Fact Sheet for Patients:  StrictlyIdeas.no Fact Sheet for Healthcare Providers: BankingDealers.co.za This test is not yet approved or cleared by the [place] FDA and has been authorized for detection and/or diagnosis of SARS-CoV-2 by FDA under an Emergency Use Authorization (EUA).  This EUA will remain in effect (meaning this test can be used) for the  duration of the COVID-19 declaration under Section 564(b)(1) of the Act, 21 U.S.C. section 360bbb-3(b)(1), unless the authorization is terminated or revoked sooner. Performed at [facility], [place] [address] [place], [place] [address]   Surgical PCR screen     Status: None   Collection Time: 04/26/19  6:12 AM   Specimen: Nasal Mucosa; Nasal Swab  Result Value Ref Range Status   MRSA, PCR NEGATIVE NEGATIVE Final   Staphylococcus aureus NEGATIVE NEGATIVE Final    Comment: (NOTE) The Xpert SA Assay (FDA approved for NASAL specimens in patients 20 years of age and older), is one component of a  comprehensive surveillance program. It is not intended to diagnose infection nor to guide or monitor treatment. Performed at [facility], [place] [address], [place],  [address]     Please note: You were cared for by a hospitalist during your hospital stay. Once you are discharged, your primary care physician will handle any further medical issues. Please note that NO REFILLS for any discharge medications will be authorized once you are discharged, as it is imperative that you return to your primary care physician (or establish a relationship with a primary care physician if you do not have one) for your post hospital discharge needs so that they can reassess your need for medications and monitor your lab values.    Time coordinating discharge: 40 minutes  SIGNED:   Shelly Coss, MD  [facility] 04/28/2019, 8:28 AM Pager [phone]  If 7PM-7AM, please contact night-coverage [email or web address] Password [email or web address]

## 2019-05-05 ENCOUNTER — Other Ambulatory Visit: Payer: Self-pay

## 2019-05-05 ENCOUNTER — Encounter (HOSPITAL_COMMUNITY): Payer: Self-pay | Admitting: Emergency Medicine

## 2019-05-05 ENCOUNTER — Emergency Department (HOSPITAL_COMMUNITY)
Admission: EM | Admit: 2019-05-05 | Discharge: 2019-05-05 | Discharge status: Against Medical Advice | Payer: No Typology Code available for payment source | Attending: Emergency Medicine | Admitting: Emergency Medicine

## 2019-05-05 ENCOUNTER — Emergency Department (HOSPITAL_COMMUNITY): Payer: No Typology Code available for payment source

## 2019-05-05 DIAGNOSIS — R600 Localized edema: Secondary | ICD-10-CM | POA: Diagnosis present

## 2019-05-05 DIAGNOSIS — Z5321 Procedure and treatment not carried out due to patient leaving prior to being seen by health care provider: Secondary | ICD-10-CM | POA: Insufficient documentation

## 2019-05-05 MED ORDER — SODIUM CHLORIDE 0.9% FLUSH: 3.0000 mL | Freq: Once | INTRAVENOUS | Status: DC

## 2019-05-05 NOTE — ED Triage Notes (Signed)
Patient reports recently having R chest dialysis catheter as well as graft placed in L arm 4-5 days ago - he reports L arm has had increased swelling since. Denies swelling elsewhere, denies pain, fevers/chills.

## 2019-07-17 ENCOUNTER — Emergency Department (HOSPITAL_COMMUNITY): Payer: No Typology Code available for payment source

## 2019-07-17 ENCOUNTER — Encounter (HOSPITAL_COMMUNITY): Payer: Self-pay | Admitting: Emergency Medicine

## 2019-07-17 ENCOUNTER — Emergency Department (HOSPITAL_COMMUNITY)
Admission: EM | Admit: 2019-07-17 | Discharge: 2019-07-17 | Disposition: A | Discharge status: Home / Self Care | Payer: No Typology Code available for payment source | Attending: Emergency Medicine | Admitting: Emergency Medicine

## 2019-07-17 ENCOUNTER — Other Ambulatory Visit: Payer: Self-pay

## 2019-07-17 DIAGNOSIS — Z992 Dependence on renal dialysis: Secondary | ICD-10-CM | POA: Insufficient documentation

## 2019-07-17 DIAGNOSIS — N186 End stage renal disease: Secondary | ICD-10-CM | POA: Diagnosis not present

## 2019-07-17 DIAGNOSIS — Z7982 Long term (current) use of aspirin: Secondary | ICD-10-CM | POA: Diagnosis not present

## 2019-07-17 DIAGNOSIS — Z20828 Contact with and (suspected) exposure to other viral communicable diseases: Secondary | ICD-10-CM | POA: Diagnosis not present

## 2019-07-17 DIAGNOSIS — Z8546 Personal history of malignant neoplasm of prostate: Secondary | ICD-10-CM | POA: Insufficient documentation

## 2019-07-17 DIAGNOSIS — I2510 Atherosclerotic heart disease of native coronary artery without angina pectoris: Secondary | ICD-10-CM | POA: Insufficient documentation

## 2019-07-17 DIAGNOSIS — F17210 Nicotine dependence, cigarettes, uncomplicated: Secondary | ICD-10-CM | POA: Insufficient documentation

## 2019-07-17 DIAGNOSIS — E1122 Type 2 diabetes mellitus with diabetic chronic kidney disease: Secondary | ICD-10-CM | POA: Diagnosis not present

## 2019-07-17 DIAGNOSIS — I959 Hypotension, unspecified: Secondary | ICD-10-CM | POA: Diagnosis present

## 2019-07-17 DIAGNOSIS — R42 Dizziness and giddiness: Secondary | ICD-10-CM

## 2019-07-17 DIAGNOSIS — Z79899 Other long term (current) drug therapy: Secondary | ICD-10-CM | POA: Insufficient documentation

## 2019-07-17 LAB — COMPREHENSIVE METABOLIC PANEL
ALT: 14 U/L (ref 0–44)
AST: 18 U/L (ref 15–41)
Albumin: 3.4 g/dL — ABNORMAL LOW (ref 3.5–5.0)
Alkaline Phosphatase: 61 U/L (ref 38–126)
Anion gap: 19 — ABNORMAL HIGH (ref 5–15)
BUN: 44 mg/dL — ABNORMAL HIGH (ref 8–23)
CO2: 22 mmol/L (ref 22–32)
Calcium: 9.4 mg/dL (ref 8.9–10.3)
Chloride: 92 mmol/L — ABNORMAL LOW (ref 98–111)
Creatinine, Ser: 6.57 mg/dL — ABNORMAL HIGH (ref 0.61–1.24)
GFR calc Af Amer: 9 mL/min — ABNORMAL LOW (ref >60)
GFR calc non Af Amer: 7 mL/min — ABNORMAL LOW (ref >60)
Glucose, Bld: 136 mg/dL — ABNORMAL HIGH (ref 70–99)
Potassium: 3.7 mmol/L (ref 3.5–5.1)
Sodium: 133 mmol/L — ABNORMAL LOW (ref 135–145)
Total Bilirubin: 0.3 mg/dL (ref 0.3–1.2)
Total Protein: 7.3 g/dL (ref 6.5–8.1)

## 2019-07-17 LAB — CBC WITH DIFFERENTIAL/PLATELET
Abs Immature Granulocytes: 0.03 10*3/uL (ref 0.00–0.07)
Basophils Absolute: 0 10*3/uL (ref 0.0–0.1)
Basophils Relative: 0 %
Eosinophils Absolute: 0.3 10*3/uL (ref 0.0–0.5)
Eosinophils Relative: 4 %
HCT: 37.6 % — ABNORMAL LOW (ref 39.0–52.0)
Hemoglobin: 12.2 g/dL — ABNORMAL LOW (ref 13.0–17.0)
Immature Granulocytes: 0 %
Lymphocytes Relative: 29 %
Lymphs Abs: 2.1 10*3/uL (ref 0.7–4.0)
MCH: 30.5 pg (ref 26.0–34.0)
MCHC: 32.4 g/dL (ref 30.0–36.0)
MCV: 94 fL (ref 80.0–100.0)
Monocytes Absolute: 0.6 10*3/uL (ref 0.1–1.0)
Monocytes Relative: 9 %
Neutro Abs: 4.1 10*3/uL (ref 1.7–7.7)
Neutrophils Relative %: 58 %
Platelets: 167 10*3/uL (ref 150–400)
RBC: 4 MIL/uL — ABNORMAL LOW (ref 4.22–5.81)
RDW: 14.4 % (ref 11.5–15.5)
WBC: 7.2 10*3/uL (ref 4.0–10.5)
nRBC: 0 % (ref 0.0–0.2)

## 2019-07-17 LAB — FERRITIN: Ferritin: 573 ng/mL — ABNORMAL HIGH (ref 24–336)

## 2019-07-17 LAB — LACTATE DEHYDROGENASE: LDH: 216 U/L — ABNORMAL HIGH (ref 98–192)

## 2019-07-17 LAB — TRIGLYCERIDES: Triglycerides: 175 mg/dL — ABNORMAL HIGH (ref <150)

## 2019-07-17 LAB — SARS CORONAVIRUS 2 BY RT PCR (HOSPITAL ORDER, PERFORMED IN ~~LOC~~ HOSPITAL LAB): SARS Coronavirus 2: NEGATIVE

## 2019-07-17 LAB — C-REACTIVE PROTEIN: CRP: 2.9 mg/dL — ABNORMAL HIGH (ref <1.0)

## 2019-07-17 LAB — PROCALCITONIN: Procalcitonin: 0.12 ng/mL

## 2019-07-17 NOTE — Discharge Instructions (Addendum)
We saw you in the ER for LOW BLOOD PRESSURE. All the results in the ER are normal, labs and imaging. We are not sure what is causing your symptoms. The workup in the ER is not complete, and is limited to screening for life threatening and emergent conditions only, so please see a primary care doctor for further evaluation.  You will have to call your dialysis center and get plugged in for dialysis on Monday. Return to the ER if you start having shortness of breath, palpitations.  Your electrolytes and chest x-ray are reassuring at the moment.

## 2019-07-17 NOTE — ED Triage Notes (Signed)
Pt reports BP 79/50 at home this morning.  States he is scheduled for dialysis today but came here due to BP.  Denies pain.

## 2019-07-17 NOTE — ED Provider Notes (Signed)
[facility] EMERGENCY DEPARTMENT Provider Note   CSN: [id number] Arrival date & time: 07/17/19  1154     History   Chief Complaint Chief Complaint  Patient presents with  . Hypotension    HPI [name] is a 78 y.o. male.     HPI  78 year old male comes in a chief complaint of low blood pressure.  He has history of ESRD on HD, diabetes, CAD.  He reports that he checks his BP every morning, and that today the BP was in the 70s.  He therefore decided to come to the ER directly before he went for his dialysis, as he is aware that the dialysis center would send him to the ED.  He denies any vomiting, diarrhea recently.  He denies any blood loss or new medications for blood pressure.  Normally his pressure is 1 10-1 30.  When he woke up he had some dizziness, but currently he denies any lightheadedness, chest pain, shortness of breath.  Past Medical History:  Diagnosis Date  . Chronic kidney disease    renal insufficiency  . Coronary artery disease   . Diabetes mellitus    type 2-non-insulin  . GERD (gastroesophageal reflux disease)   . Gout   . Hypertension   . Obesity   . Prostate cancer ([place])   . Tobacco abuse counseling     Patient Active Problem List   Diagnosis Date Noted  . End stage renal disease ([place])   . Anemia   . Acute combined systolic and diastolic heart failure ([place])   . CKD (chronic kidney disease) stage 5, GFR less than 15 ml/min (H[facility])   . NSVT (nonsustained ventricular tachycardia) ([place])   . LBBB (left bundle branch block)   . Class 2 severe obesity due to excess calories with serious comorbidity and body mass index (BMI) of 38.0 to 38.9 in adult [facility])   . Chest pain 04/19/2019  . Shortness of breath 04/19/2019  . Malignant neoplasm of prostate ([place]) 07/06/2018  . Hypoglycemia 03/29/2017  . Coronary artery disease   . Hypertension   . Diabetes mellitus   . Chronic kidney disease     Past Surgical History:  Procedure  Laterality Date  . AV FISTULA PLACEMENT Left 04/26/2019   Procedure: INSERTION OF ARTERIOVENOUS (AV) GORE-TEX GRAFT ARM - using Gore Vascular Graft size 45cm;  Surgeon: Rosetta Posner, MD;  Location: [facility];  Service: Vascular;  Laterality: Left;  . CARDIAC CATHETERIZATION  02/08/02  . CARDIAC CATHETERIZATION  11/20/10   drug eluting stent;mid left LAD, There is mild hypokinesis of the anterolateral wall. LVEF  is estimated at 50-55%  . INSERTION OF BRONCHIAL STENT     went in groin to R chest  . INSERTION OF DIALYSIS CATHETER Right 04/26/2019   Procedure: INSERTION OF HEMODIALYSIS CATHETER - using Palindrome Catheter size 23cm;  Surgeon: Rosetta Posner, MD;  Location: [facility];  Service: Vascular;  Laterality: Right;  . LEFT HEART CATH AND CORONARY ANGIOGRAPHY N/A 04/23/2019   Procedure: LEFT HEART CATH AND CORONARY ANGIOGRAPHY;  Surgeon: Sherren Mocha, MD;  Location: [facility];  Service: Cardiovascular;  Laterality: N/A;  . PENILE PROSTHESIS IMPLANT     '05        Home Medications    Prior to Admission medications   Medication Sig Start Date End Date Taking? Authorizing Provider  allopurinol (ZYLOPRIM) 100 MG tablet Take 100 mg by mouth 2 (two) times daily.    Yes [provider]  aspirin EC 81 MG tablet Take 81 mg by mouth daily.   Yes [provider]  atorvastatin (LIPITOR) 40 MG tablet Take 40 mg by mouth at bedtime.   Yes [provider]  calcitRIOL (ROCALTROL) 0.25 MCG capsule Take 0.5 mcg by mouth daily.   Yes [provider]  carvedilol (COREG) 6.25 MG tablet Take 1 tablet (6.25 mg total) by mouth 2 (two) times daily with a meal. 04/28/19  Yes Adhikari, Amrit, MD  cholecalciferol (VITAMIN D) 1000 UNITS tablet Take 1,000 Units by mouth daily.   Yes [provider]  docusate sodium (COLACE) 100 MG capsule Take 100 mg by mouth daily.   Yes [provider]  gabapentin (NEURONTIN) 400 MG capsule Take 400 mg by mouth at bedtime.    Yes [provider]  losartan (COZAAR) 100 MG tablet Take 100 mg by mouth daily.   Yes [provider]  nitroGLYCERIN (NITROSTAT) 0.4 MG SL tablet Place 0.4 mg under the tongue every 5 (five) minutes as needed for chest pain. For chest pain    Yes [provider]  omeprazole (PRILOSEC) 20 MG capsule Take 20 mg by mouth daily.   Yes [provider]  amLODipine (NORVASC) 5 MG tablet Take 5 mg by mouth daily.    [provider]  isosorbide-hydrALAZINE (BIDIL) 20-37.5 MG tablet Take 1 tablet by mouth 3 (three) times daily. Patient not taking: Reported on 07/17/2019 04/28/19   Shelly Coss, MD    Family History Family History  Problem Relation Age of Onset  . Aneurysm Mother   . Multiple myeloma Mother   . Aneurysm Father   . Cancer Brother   . Prostate cancer Brother   . Cancer Other   . Prostate cancer Brother   . Bone cancer Brother   . Breast cancer Neg Hx   . Colon cancer Neg Hx   . Pancreatic cancer Neg Hx     Social History Social History   Tobacco Use  . Smoking status: Current Every Day Smoker    Packs/day: 0.50    Years: 50.00    Pack years: 25.00    Types: Cigarettes  . Smokeless tobacco: Never Used  Substance Use Topics  . Alcohol use: Not Currently    Comment: drank 5 beers 03-28-17  . Drug use: Not Currently    Types: Cocaine, Marijuana     Allergies   Patient has no known allergies.   Review of Systems Review of Systems  Constitutional: Positive for activity change.  Respiratory: Negative for shortness of breath.   Cardiovascular: Negative for chest pain.  Neurological: Positive for dizziness. Negative for syncope.  All other systems reviewed and are negative.   Physical Exam Updated Vital Signs BP (!) 135/99   Pulse 80   Temp 98.1 F (36.7 C) (Oral)   Resp 16   SpO2 97%   Physical Exam Vitals signs and nursing note reviewed.  Constitutional:      Appearance: He is well-developed.  HENT:      Head: Atraumatic.  Neck:     Musculoskeletal: Neck supple.  Cardiovascular:     Rate and Rhythm: Normal rate.  Pulmonary:     Effort: Pulmonary effort is normal.  Skin:    General: Skin is warm.  Neurological:     Mental Status: He is alert and oriented to person, place, and time.      ED Treatments / Results  Labs (all labs ordered are listed, but  only abnormal results are displayed) Labs Reviewed  CBC WITH DIFFERENTIAL/PLATELET - Abnormal; Notable for the following components:      Result Value   RBC 4.00 (*)    Hemoglobin 12.2 (*)    HCT 37.6 (*)    All other components within normal limits  COMPREHENSIVE METABOLIC PANEL - Abnormal; Notable for the following components:   Sodium 133 (*)    Chloride 92 (*)    Glucose, Bld 136 (*)    BUN 44 (*)    Creatinine, Ser 6.57 (*)    Albumin 3.4 (*)    GFR calc non Af Amer 7 (*)    GFR calc Af Amer 9 (*)    Anion gap 19 (*)    All other components within normal limits  LACTATE DEHYDROGENASE - Abnormal; Notable for the following components:   LDH 216 (*)    All other components within normal limits  C-REACTIVE PROTEIN - Abnormal; Notable for the following components:   CRP 2.9 (*)    All other components within normal limits  TRIGLYCERIDES - Abnormal; Notable for the following components:   Triglycerides 175 (*)    All other components within normal limits  FERRITIN - Abnormal; Notable for the following components:   Ferritin 573 (*)    All other components within normal limits  SARS CORONAVIRUS 2 BY RT PCR (HOSPITAL ORDER, [facility])  PROCALCITONIN    EKG EKG Interpretation  Date/Time:  Saturday July 17 2019 13:13:22 EDT Ventricular Rate:  80 PR Interval:    QRS Duration: 157 QT Interval:  449 QTC Calculation: 518 R Axis:   -102 Text Interpretation:  Sinus rhythm Prolonged PR interval Nonspecific IVCD with LAD Anterior infarct, old No acute changes Confirmed by Varney Biles  [phone]) on 07/17/2019 5:10:41 PM   Radiology No results found.  Procedures Procedures (including critical care time)  Medications Ordered in ED Medications - No data to display   Initial Impression / Assessment and Plan / ED Course  I have reviewed the triage vital signs and the nursing notes.  Pertinent labs & imaging results that were available during my care of the patient were reviewed by me and considered in my medical decision making (see chart for details).        Patient presents with chief complaint of dizziness. He was supposed to go for dialysis however when he checked his BP prior to going there was low and he felt dizzy.  He denies any chest pain, shortness of breath, near syncope.  He denies any nausea, vomiting, fevers, chills, blood loss.  Exam is overall reassuring.  Patient's BP in the ER is within normal limits.  He states that normally his blood pressure runs between 381-0 30 systolic.  He did not go to his dialysis and instead chose to come to the ED, as he was afraid that they would send him to the ER with that low blood pressure.  Appropriate labs ordered and looking reassuring.  X-ray is fine, potassium is normal.  Patient's BP at no point dropped less than 100 in the ED.  I do not think he has underlying infectious process going on.  He is not having any focal neurologic symptoms right now.  He stable for discharge.  I advised him that we attempted to call the dialysis clinic however they did not pick up the phone call and were closed for the day.  He needs to call them on Monday so that  they can sneak him in for an urgent dialysis.  In the interim, he will return to the ER if he starts having worsening shortness of breath, worsening of his dizziness, chest pain, palpitations.   Final Clinical Impressions(s) / ED Diagnoses   Final diagnoses:  Dizziness    ED Discharge Orders    None       Varney Biles, MD 07/22/19 [phone]

## 2019-07-17 NOTE — ED Notes (Signed)
Patient Alert and oriented to baseline. Stable and ambulatory to baseline. Patient verbalized understanding of the discharge instructions.  Patient belongings were taken by the patient.   

## 2019-07-17 NOTE — ED Notes (Signed)
IV team at bedside 

## 2019-07-29 ENCOUNTER — Other Ambulatory Visit: Payer: Self-pay

## 2019-07-29 ENCOUNTER — Other Ambulatory Visit (HOSPITAL_COMMUNITY): Payer: Self-pay | Admitting: Urology

## 2019-07-29 ENCOUNTER — Ambulatory Visit (HOSPITAL_COMMUNITY)
Admission: RE | Admit: 2019-07-29 | Discharge: 2019-07-29 | Disposition: A | Discharge status: Home / Self Care | Payer: No Typology Code available for payment source | Source: Ambulatory Visit | Attending: Urology | Admitting: Urology

## 2019-07-29 DIAGNOSIS — C641 Malignant neoplasm of right kidney, except renal pelvis: Secondary | ICD-10-CM | POA: Diagnosis present

## 2019-08-01 ENCOUNTER — Other Ambulatory Visit: Payer: Self-pay

## 2019-08-01 ENCOUNTER — Emergency Department (HOSPITAL_COMMUNITY)
Admission: EM | Admit: 2019-08-01 | Discharge: 2019-08-01 | Disposition: A | Discharge status: Home / Self Care | Payer: No Typology Code available for payment source | Attending: Emergency Medicine | Admitting: Emergency Medicine

## 2019-08-01 DIAGNOSIS — E1122 Type 2 diabetes mellitus with diabetic chronic kidney disease: Secondary | ICD-10-CM | POA: Insufficient documentation

## 2019-08-01 DIAGNOSIS — F17210 Nicotine dependence, cigarettes, uncomplicated: Secondary | ICD-10-CM | POA: Diagnosis not present

## 2019-08-01 DIAGNOSIS — Z7901 Long term (current) use of anticoagulants: Secondary | ICD-10-CM | POA: Insufficient documentation

## 2019-08-01 DIAGNOSIS — Z992 Dependence on renal dialysis: Secondary | ICD-10-CM | POA: Diagnosis not present

## 2019-08-01 DIAGNOSIS — I2510 Atherosclerotic heart disease of native coronary artery without angina pectoris: Secondary | ICD-10-CM | POA: Diagnosis not present

## 2019-08-01 DIAGNOSIS — T82868A Thrombosis of vascular prosthetic devices, implants and grafts, initial encounter: Secondary | ICD-10-CM | POA: Diagnosis present

## 2019-08-01 DIAGNOSIS — Y712 Prosthetic and other implants, materials and accessory cardiovascular devices associated with adverse incidents: Secondary | ICD-10-CM | POA: Insufficient documentation

## 2019-08-01 DIAGNOSIS — N186 End stage renal disease: Secondary | ICD-10-CM | POA: Diagnosis not present

## 2019-08-01 DIAGNOSIS — Z7982 Long term (current) use of aspirin: Secondary | ICD-10-CM | POA: Diagnosis not present

## 2019-08-01 DIAGNOSIS — I120 Hypertensive chronic kidney disease with stage 5 chronic kidney disease or end stage renal disease: Secondary | ICD-10-CM | POA: Diagnosis not present

## 2019-08-01 DIAGNOSIS — Z79899 Other long term (current) drug therapy: Secondary | ICD-10-CM | POA: Insufficient documentation

## 2019-08-01 LAB — CBC
HCT: 34.1 % — ABNORMAL LOW (ref 39.0–52.0)
Hemoglobin: 11.7 g/dL — ABNORMAL LOW (ref 13.0–17.0)
MCH: 30.5 pg (ref 26.0–34.0)
MCHC: 34.3 g/dL (ref 30.0–36.0)
MCV: 89 fL (ref 80.0–100.0)
Platelets: 171 10*3/uL (ref 150–400)
RBC: 3.83 MIL/uL — ABNORMAL LOW (ref 4.22–5.81)
RDW: 14.6 % (ref 11.5–15.5)
WBC: 8.4 10*3/uL (ref 4.0–10.5)
nRBC: 0 % (ref 0.0–0.2)

## 2019-08-01 LAB — BASIC METABOLIC PANEL
Anion gap: 15 (ref 5–15)
BUN: 41 mg/dL — ABNORMAL HIGH (ref 8–23)
CO2: 23 mmol/L (ref 22–32)
Calcium: 9 mg/dL (ref 8.9–10.3)
Chloride: 94 mmol/L — ABNORMAL LOW (ref 98–111)
Creatinine, Ser: 5.35 mg/dL — ABNORMAL HIGH (ref 0.61–1.24)
GFR calc Af Amer: 11 mL/min — ABNORMAL LOW (ref >60)
GFR calc non Af Amer: 10 mL/min — ABNORMAL LOW (ref >60)
Glucose, Bld: 187 mg/dL — ABNORMAL HIGH (ref 70–99)
Potassium: 3.7 mmol/L (ref 3.5–5.1)
Sodium: 132 mmol/L — ABNORMAL LOW (ref 135–145)

## 2019-08-01 LAB — PROTIME-INR
INR: 1.1 (ref 0.8–1.2)
Prothrombin Time: 14 seconds (ref 11.4–15.2)

## 2019-08-01 NOTE — Discharge Instructions (Signed)
Nephrology will contact you Monday morning to schedule a procedure for your clotted AV graft.  If you develop redness, warmth, tenderness to your graft site, fever of 100.4 or higher, please return to the emergency department.  If you develop chest pressure, shortness of breath, palpitations, dizziness and feel like you are going to pass out or do pass out, please return to the emergency department.

## 2019-08-01 NOTE — ED Provider Notes (Signed)
TIME SEEN: 12:50 AM  CHIEF COMPLAINT: Clotted left AV graft  HPI: Patient is a 78 year old male with history of hypertension, diabetes, CAD, end-stage renal disease on hemodialysis Tuesday, Thursday and Saturday who was last dialyzed on Thursday, November 5 who presents to the emergency department with a clotted left AV graft in his left forearm.  Graft placed by Dr. Donnetta Hutching with vascular surgery on 04/26/2019.  States went to dialysis yesterday Saturday, November 7 and they were unable to use the graft and when they tried to access it clot came out.  He is not on antiplatelets or anticoagulants.  He states he noticed that there was no thrill on Friday, November 6.  He was told by the dialysis center to come to the ED earlier today.  Denies any other symptoms.  No pain, redness or swelling in this arm.  Denies chest pain, shortness of breath, headache, vision changes.  States they have been using this graft at dialysis for over the past month without difficulty.  Thinks that his right tunneled IJ hemodialysis catheter was removed sometime in September.  ROS: See HPI Constitutional: no fever  Eyes: no drainage  ENT: no runny nose   Cardiovascular:  no chest pain  Resp: no SOB  GI: no vomiting GU: no dysuria Integumentary: no rash  Allergy: no hives  Musculoskeletal: no leg swelling  Neurological: no slurred speech ROS otherwise negative  PAST MEDICAL HISTORY/PAST SURGICAL HISTORY:  Past Medical History:  Diagnosis Date  . Chronic kidney disease    renal insufficiency  . Coronary artery disease   . Diabetes mellitus    type 2-non-insulin  . GERD (gastroesophageal reflux disease)   . Gout   . Hypertension   . Obesity   . Prostate cancer ([place])   . Tobacco abuse counseling     MEDICATIONS:  Prior to Admission medications   Medication Sig Start Date End Date Taking? Authorizing Provider  allopurinol (ZYLOPRIM) 100 MG tablet Take 100 mg by mouth 2 (two) times daily.     [provider]  amLODipine (NORVASC) 5 MG tablet Take 5 mg by mouth daily.    [provider]  aspirin EC 81 MG tablet Take 81 mg by mouth daily.    [provider]  atorvastatin (LIPITOR) 40 MG tablet Take 40 mg by mouth at bedtime.    [provider]  calcitRIOL (ROCALTROL) 0.25 MCG capsule Take 0.5 mcg by mouth daily.    [provider]  carvedilol (COREG) 6.25 MG tablet Take 1 tablet (6.25 mg total) by mouth 2 (two) times daily with a meal. 04/28/19   Shelly Coss, MD  cholecalciferol (VITAMIN D) 1000 UNITS tablet Take 1,000 Units by mouth daily.    [provider]  docusate sodium (COLACE) 100 MG capsule Take 100 mg by mouth daily.    [provider]  gabapentin (NEURONTIN) 400 MG capsule Take 400 mg by mouth at bedtime.    [provider]  isosorbide-hydrALAZINE (BIDIL) 20-37.5 MG tablet Take 1 tablet by mouth 3 (three) times daily. Patient not taking: Reported on 07/17/2019 04/28/19   Shelly Coss, MD  losartan (COZAAR) 100 MG tablet Take 100 mg by mouth daily.    [provider]  nitroGLYCERIN (NITROSTAT) 0.4 MG SL tablet Place 0.4 mg under the tongue every 5 (five) minutes as needed for chest pain. For chest pain     [provider]  omeprazole (PRILOSEC) 20 MG capsule Take 20 mg by mouth daily.  [provider]    ALLERGIES:  No Known Allergies  SOCIAL HISTORY:  Social History   Tobacco Use  . Smoking status: Current Every Day Smoker    Packs/day: 0.50    Years: 50.00    Pack years: 25.00    Types: Cigarettes  . Smokeless tobacco: Never Used  Substance Use Topics  . Alcohol use: Not Currently    Comment: drank 5 beers 03-28-17    FAMILY HISTORY: Family History  Problem Relation Age of Onset  . Aneurysm Mother   . Multiple myeloma Mother   . Aneurysm Father   . Cancer Brother   . Prostate cancer Brother   . Cancer Other   . Prostate cancer Brother   . Bone cancer Brother    . Breast cancer Neg Hx   . Colon cancer Neg Hx   . Pancreatic cancer Neg Hx     EXAM: BP (!) 150/95 (BP Location: Right Arm)   Temp 98.3 F (36.8 C) (Oral)   Resp 20   SpO2 95%  CONSTITUTIONAL: Alert and oriented and responds appropriately to questions. Well-appearing; well-nourished HEAD: Normocephalic EYES: Conjunctivae clear, pupils appear equal, EOMI ENT: normal nose; moist mucous membranes NECK: Supple, no meningismus, no nuchal rigidity, no LAD  CARD: RRR; S1 and S2 appreciated; no murmurs, no clicks, no rubs, no gallops RESP: Normal chest excursion without splinting or tachypnea; breath sounds clear and equal bilaterally; no wheezes, no rhonchi, no rales, no hypoxia or respiratory distress, speaking full sentences ABD/GI: Normal bowel sounds; non-distended; soft, non-tender, no rebound, no guarding, no peritoneal signs, no hepatosplenomegaly BACK:  The back appears normal and is non-tender to palpation, there is no CVA tenderness EXT: Normal ROM in all joints; non-tender to palpation; no edema; normal capillary refill; no cyanosis, no calf tenderness or swelling, left forearm AV graft without thrill, 2+ left radial pulse, no redness or warmth or swelling or tenderness noted to the left arm, normal left grip strength and normal sensation in the left arm    SKIN: Normal color for age and race; warm; no rash NEURO: Moves all extremities equally PSYCH: The patient's mood and manner are appropriate. Grooming and personal hygiene are appropriate.  MEDICAL DECISION MAKING: Patient here with a clotted AV graft.  He has no other dialysis access currently.  Was last dialyzed on 07/29/19.  Currently not symptomatic.  Does not appear volume overloaded.  No signs of infection of the graft.  Neurovascularly intact distally.   ED PROGRESS: 1:15 AM  Spoke with Dr. Donnetta Hutching.  States this is usually managed initially by endovascular services through nephrology.  Recommends contacting the nephrologist  on-call for possible intervention on Monday morning.  If endovascular procedures do not help relieve this issue, that is when surgery would become involved.  Will discuss with Dr. Melvia Heaps on-call.  1:21 AM  Spoke with Dr. Jonnie Finner on call for nephrology.  He states patient can be discharged home given his blood pressure, potassium are normal and he is not having any shortness of breath, hypoxia or increased work of breathing.  No signs of volume overload on exam.  They will call patient Monday morning to schedule endovascular procedure.  Patient updated with plan.     At this time, I do not feel there is any life-threatening condition present. I have reviewed, interpreted and discussed all results (EKG, imaging, lab, urine as appropriate) and exam findings with patient/family. I have reviewed nursing notes and appropriate previous records.  I feel the  patient is safe to be discharged home without further emergent workup and can continue workup as an outpatient as needed. Discussed usual and customary return precautions. Patient/family verbalize understanding and are comfortable with this plan.  Outpatient follow-up has been provided as needed. All questions have been answered.   [name] was evaluated in Emergency Department on 08/01/2019 for the symptoms described in the history of present illness. He was evaluated in the context of the global COVID-19 pandemic, which necessitated consideration that the patient might be at risk for infection with the SARS-CoV-2 virus that causes COVID-19. Institutional protocols and algorithms that pertain to the evaluation of patients at risk for COVID-19 are in a state of rapid change based on information released by regulatory bodies including the CDC and federal and state organizations. These policies and algorithms were followed during the patient's care in the ED.    Umberto Pavek, Delice Bison, DO 08/01/19 0139

## 2019-08-01 NOTE — ED Triage Notes (Signed)
Pt states that he went to dialysis yesterday about 3pm and they were unable to access his fistula and told him that it had clotted.

## 2019-08-01 NOTE — ED Notes (Signed)
Patient verbalizes understanding of discharge instructions. Opportunity for questioning and answers were provided. Armband removed by staff, pt discharged from ED. Pt. ambulatory and discharged home.  

## 2019-08-14 ENCOUNTER — Encounter (HOSPITAL_COMMUNITY): Payer: Self-pay

## 2019-08-14 ENCOUNTER — Other Ambulatory Visit: Payer: Self-pay

## 2019-08-14 ENCOUNTER — Emergency Department (HOSPITAL_COMMUNITY)
Admission: EM | Admit: 2019-08-14 | Discharge: 2019-08-14 | Disposition: A | Discharge status: Home / Self Care | Payer: No Typology Code available for payment source | Attending: Emergency Medicine | Admitting: Emergency Medicine

## 2019-08-14 DIAGNOSIS — T82868A Thrombosis of vascular prosthetic devices, implants and grafts, initial encounter: Secondary | ICD-10-CM | POA: Diagnosis present

## 2019-08-14 DIAGNOSIS — F17210 Nicotine dependence, cigarettes, uncomplicated: Secondary | ICD-10-CM | POA: Insufficient documentation

## 2019-08-14 DIAGNOSIS — Y712 Prosthetic and other implants, materials and accessory cardiovascular devices associated with adverse incidents: Secondary | ICD-10-CM | POA: Diagnosis not present

## 2019-08-14 DIAGNOSIS — N185 Chronic kidney disease, stage 5: Secondary | ICD-10-CM | POA: Diagnosis not present

## 2019-08-14 DIAGNOSIS — I120 Hypertensive chronic kidney disease with stage 5 chronic kidney disease or end stage renal disease: Secondary | ICD-10-CM | POA: Insufficient documentation

## 2019-08-14 DIAGNOSIS — Z79899 Other long term (current) drug therapy: Secondary | ICD-10-CM | POA: Diagnosis not present

## 2019-08-14 DIAGNOSIS — E1122 Type 2 diabetes mellitus with diabetic chronic kidney disease: Secondary | ICD-10-CM | POA: Insufficient documentation

## 2019-08-14 DIAGNOSIS — Z7982 Long term (current) use of aspirin: Secondary | ICD-10-CM | POA: Diagnosis not present

## 2019-08-14 LAB — BASIC METABOLIC PANEL
Anion gap: 8 (ref 5–15)
BUN: 23 mg/dL (ref 8–23)
CO2: 28 mmol/L (ref 22–32)
Calcium: 9.3 mg/dL (ref 8.9–10.3)
Chloride: 100 mmol/L (ref 98–111)
Creatinine, Ser: 4.85 mg/dL — ABNORMAL HIGH (ref 0.61–1.24)
GFR calc Af Amer: 12 mL/min — ABNORMAL LOW (ref >60)
GFR calc non Af Amer: 11 mL/min — ABNORMAL LOW (ref >60)
Glucose, Bld: 111 mg/dL — ABNORMAL HIGH (ref 70–99)
Potassium: 4.6 mmol/L (ref 3.5–5.1)
Sodium: 136 mmol/L (ref 135–145)

## 2019-08-14 LAB — CBC WITH DIFFERENTIAL/PLATELET
Abs Immature Granulocytes: 0.01 10*3/uL (ref 0.00–0.07)
Basophils Absolute: 0 10*3/uL (ref 0.0–0.1)
Basophils Relative: 1 %
Eosinophils Absolute: 0.2 10*3/uL (ref 0.0–0.5)
Eosinophils Relative: 4 %
HCT: 33.8 % — ABNORMAL LOW (ref 39.0–52.0)
Hemoglobin: 11.6 g/dL — ABNORMAL LOW (ref 13.0–17.0)
Immature Granulocytes: 0 %
Lymphocytes Relative: 30 %
Lymphs Abs: 1.9 10*3/uL (ref 0.7–4.0)
MCH: 30.2 pg (ref 26.0–34.0)
MCHC: 34.3 g/dL (ref 30.0–36.0)
MCV: 88 fL (ref 80.0–100.0)
Monocytes Absolute: 0.5 10*3/uL (ref 0.1–1.0)
Monocytes Relative: 9 %
Neutro Abs: 3.5 10*3/uL (ref 1.7–7.7)
Neutrophils Relative %: 56 %
Platelets: 125 10*3/uL — ABNORMAL LOW (ref 150–400)
RBC: 3.84 MIL/uL — ABNORMAL LOW (ref 4.22–5.81)
RDW: 14.6 % (ref 11.5–15.5)
WBC: 6.1 10*3/uL (ref 4.0–10.5)
nRBC: 0 % (ref 0.0–0.2)

## 2019-08-14 NOTE — ED Provider Notes (Signed)
[facility] EMERGENCY DEPARTMENT Provider Note   CSN: [id number] Arrival date & time: 08/14/19  2706     History   Chief Complaint Chief Complaint  Patient presents with  . clotted dialysis port    HPI [name] is a 78 y.o. 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  Reports that yesterday around noon he noticed that the thrill to his fistula was no longer there.  He thinks that the fistula has clotted off.  He called his dialysis facility who recommended presentation to the ED for further evaluation.  Denies fevers, pain, abnormal drainage, numbness or weakness in the extremities, chest pain, shortness of breath, abdominal pain, nausea, or vomiting.  He did receive full dialysis treatment on Thursday without issues.  He was seen and evaluated in the ED on 08/01/2019 for a similar problem when your vascular surgery was consulted and recommended endovascular services through nephrology.  Nephrology set up endovascular procedure last week on an outpatient basis which the patient tolerated without difficulty.     The history is provided by the patient.    Past Medical History:  Diagnosis Date  . Chronic kidney disease    renal insufficiency  . Coronary artery disease   . Diabetes mellitus    type 2-non-insulin  . GERD (gastroesophageal reflux disease)   . Gout   . Hypertension   . Obesity   . Prostate cancer ([place])   . Tobacco abuse counseling     Patient Active Problem List   Diagnosis Date Noted  . End stage renal disease ([place])   . Anemia   . Acute combined systolic and diastolic heart failure ([place])   . CKD (chronic kidney disease) stage 5, GFR less than 15 ml/min ([facility])   . NSVT (nonsustained ventricular tachycardia) ([place])   . LBBB (left bundle branch block)   .  Class 2 severe obesity due to excess calories with serious comorbidity and body mass index (BMI) of 38.0 to 38.9 in adult [facility])   . Chest pain 04/19/2019  . Shortness of breath 04/19/2019  . Malignant neoplasm of prostate ([place]) 07/06/2018  . Hypoglycemia 03/29/2017  . Coronary artery disease   . Hypertension   . Diabetes mellitus   . Chronic kidney disease     Past Surgical History:  Procedure Laterality Date  . AV FISTULA PLACEMENT Left 04/26/2019   Procedure: INSERTION OF ARTERIOVENOUS (AV) GORE-TEX GRAFT ARM - using [facility] Graft size 45cm;  Surgeon: Rosetta Posner, MD;  Location: [facility];  Service: Vascular;  Laterality: Left;  . CARDIAC CATHETERIZATION  02/08/02  . CARDIAC CATHETERIZATION  11/20/10   drug eluting stent;mid left LAD, There is mild hypokinesis of the anterolateral wall. LVEF  is estimated at 50-55%  . INSERTION OF BRONCHIAL STENT     went in groin to R chest  . INSERTION OF DIALYSIS CATHETER Right 04/26/2019   Procedure: INSERTION OF HEMODIALYSIS CATHETER - using Palindrome Catheter size 23cm;  Surgeon: Rosetta Posner, MD;  Location: [facility];  Service: Vascular;  Laterality: Right;  . LEFT HEART CATH AND CORONARY ANGIOGRAPHY N/A 04/23/2019   Procedure: LEFT HEART CATH AND CORONARY ANGIOGRAPHY;  Surgeon: Sherren Mocha, MD;  Location: [facility] CV LAB;  Service: Cardiovascular;  Laterality: N/A;  . PENILE PROSTHESIS IMPLANT     '05  Home Medications    Prior to Admission medications   Medication Sig Start Date End Date Taking? Authorizing Provider  allopurinol (ZYLOPRIM) 100 MG tablet Take 100 mg by mouth 2 (two) times daily.     [provider]  amLODipine (NORVASC) 5 MG tablet Take 5 mg by mouth daily.    [provider]  aspirin EC 81 MG tablet Take 81 mg by mouth daily.    [provider]  atorvastatin (LIPITOR) 40 MG tablet Take 40 mg by mouth at bedtime.    [provider]  calcitRIOL (ROCALTROL) 0.25 MCG capsule  Take 0.5 mcg by mouth daily.    [provider]  carvedilol (COREG) 6.25 MG tablet Take 1 tablet (6.25 mg total) by mouth 2 (two) times daily with a meal. 04/28/19   Shelly Coss, MD  cholecalciferol (VITAMIN D) 1000 UNITS tablet Take 1,000 Units by mouth daily.    [provider]  docusate sodium (COLACE) 100 MG capsule Take 100 mg by mouth daily.    [provider]  gabapentin (NEURONTIN) 400 MG capsule Take 400 mg by mouth at bedtime.    [provider]  isosorbide-hydrALAZINE (BIDIL) 20-37.5 MG tablet Take 1 tablet by mouth 3 (three) times daily. Patient not taking: Reported on 07/17/2019 04/28/19   Shelly Coss, MD  losartan (COZAAR) 100 MG tablet Take 100 mg by mouth daily.    [provider]  nitroGLYCERIN (NITROSTAT) 0.4 MG SL tablet Place 0.4 mg under the tongue every 5 (five) minutes as needed for chest pain. For chest pain     [provider]  omeprazole (PRILOSEC) 20 MG capsule Take 20 mg by mouth daily.    [provider]    Family History Family History  Problem Relation Age of Onset  . Aneurysm Mother   . Multiple myeloma Mother   . Aneurysm Father   . Cancer Brother   . Prostate cancer Brother   . Cancer Other   . Prostate cancer Brother   . Bone cancer Brother   . Breast cancer Neg Hx   . Colon cancer Neg Hx   . Pancreatic cancer Neg Hx     Social History Social History   Tobacco Use  . Smoking status: Current Every Day Smoker    Packs/day: 0.50    Years: 50.00    Pack years: 25.00    Types: Cigarettes  . Smokeless tobacco: Never Used  Substance Use Topics  . Alcohol use: Not Currently    Comment: drank 5 beers 03-28-17  . Drug use: Not Currently    Types: Cocaine, Marijuana     Allergies   Patient has no known allergies.   Review of Systems Review of Systems  Constitutional: Negative for chills and fever.  Respiratory: Negative for shortness of breath.   Cardiovascular: Negative for  chest pain.  Gastrointestinal: Negative for abdominal pain, nausea and vomiting.  All other systems reviewed and are negative.    Physical Exam Updated Vital Signs BP 127/80 (BP Location: Right Arm)   Pulse (!) 54   Temp 98.9 F (37.2 C) (Oral)   Resp 15   Ht _0  (1.727 m)   Wt 90.7 kg   SpO2 99%   BMI 30.41 kg/m   Physical Exam Vitals signs and nursing note reviewed.  Constitutional:      General: He is not in acute distress.    Appearance: He is well-developed.     Comments: Resting comfortably in bed  HENT:     Head: Normocephalic and atraumatic.  Eyes:     General:        Right eye: No discharge.        Left eye: No discharge.     Conjunctiva/sclera: Conjunctivae normal.  Neck:     Vascular: No JVD.     Trachea: No tracheal deviation.  Cardiovascular:     Rate and Rhythm: Normal rate and regular rhythm.     Comments: Left AV fistula with no palpable thrill, no dilatations or signs of secondary skin infection.  2+ radial pulses bilaterally Pulmonary:     Effort: Pulmonary effort is normal.     Breath sounds: Normal breath sounds.  Abdominal:     General: Bowel sounds are normal. There is no distension.     Palpations: Abdomen is soft.     Tenderness: There is no abdominal tenderness. There is no guarding or rebound.  Skin:    General: Skin is warm and dry.     Findings: No erythema.  Neurological:     Mental Status: He is alert.  Psychiatric:        Behavior: Behavior normal.      ED Treatments / Results  Labs (all labs ordered are listed, but only abnormal results are displayed) Labs Reviewed  BASIC METABOLIC PANEL - Abnormal; Notable for the following components:      Result Value   Glucose, Bld 111 (*)    Creatinine, Ser 4.85 (*)    GFR calc non Af Amer 11 (*)    GFR calc Af Amer 12 (*)    All other components within normal limits  CBC WITH DIFFERENTIAL/PLATELET - Abnormal; Notable for the following components:   RBC 3.84 (*)    Hemoglobin  11.6 (*)    HCT 33.8 (*)    Platelets 125 (*)    All other components within normal limits    EKG EKG Interpretation  Date/Time:  Saturday August 14 2019 09:36:31 EST Ventricular Rate:  70 PR Interval:    QRS Duration: 144 QT Interval:  458 QTC Calculation: 495 R Axis:   -78 Text Interpretation: Sinus rhythm Prolonged PR interval Nonspecific IVCD with LAD Anterior infarct, old No significant change since last tracing Confirmed by Gareth Morgan [id number]) on 08/14/2019 10:35:12 AM   Radiology No results found.  Procedures Procedures (including critical care time)  Medications Ordered in ED Medications - No data to display   Initial Impression / Assessment and Plan / ED Course  I have reviewed the triage vital signs and the nursing notes.  Pertinent labs & imaging results that were available during my care of the patient were reviewed by me and considered in my medical decision making (see chart for details).        Patient presenting for evaluation of occluded left AV fistula.  Noted loss of thrill yesterday around noon.  He is afebrile, vital signs are stable.  He is nontoxic in appearance.  No signs of secondary skin infection.  He was last dialyzed on Thursday, was unable to be dialyzed today due to AV fistula problem.  He denies any shortness of breath, chest pain, nausea, vomiting, abdominal pain.  SPO2 saturations are stable in the ED.  EKG shows no acute ischemic abnormalities or arrhythmias.  Lab work reviewed by me shows no leukocytosis, stable anemia, elevated creatinine but BUN within normal limits.  Potassium within normal limits.  CONSULT: Spoke with Dr. Donzetta Matters with vascular surgery.  If no  indications for admission or emergent dialysis, he recommends outpatient intervention at [facility] for thrombectomy.   CONSULT: Dr. Billy Fischer spoke with Dr. Annamaria Boots with interventional radiology who advises they are unable to do elective procedures on the weekends.    CONSULT: Spoke with Dr. Hollie Salk with nephrology. She has already contacted the patient's dialysis facility and they will arrange for outpatient thrombectomy with subsequent dialysis treatment on Monday. She recommends further fluid restriction and monitoring of K+ intake until then.   On reevaluation patient resting comfortably in no apparent distress.  He continues to deny any particular complaints.  He is in agreement with plan, understands that his dialysis facility will arrange for follow-up outpatient for Monday.  Discussed strict ED return precautions. Pt verbalized understanding of and agreement with plan and is safe for discharge home at this time. Discussed with Dr. Billy Fischer who agrees with assessment and plan at this time.   Final Clinical Impressions(s) / ED Diagnoses   Final diagnoses:  Thrombosis of arteriovenous fistula, initial encounter [facility])    ED Discharge Orders    None       Debroah Baller 08/14/19 [place], MD 08/15/19 [phone]

## 2019-08-14 NOTE — Discharge Instructions (Signed)
Your fistula is likely occluded (blocked off).  You likely will need a procedure to remove the blockage.  This can be done at [facility] vascular [place] off [address].  They and your dialysis facility will be in touch.  We will plan to do the procedure on Monday and for you to undergo dialysis on Monday afterwards as well.  In the meantime, please limit your fluid intake and potassium intake more than usual as it will be several days in between dialysis treatments.  Please return to the emergency department immediately if any concerning signs or symptoms develop such as shortness of breath, chest pains, persistent vomiting, fevers, loss of consciousness, weakness.

## 2019-08-14 NOTE — ED Notes (Signed)
ED Provider at bedside. 

## 2019-08-14 NOTE — ED Notes (Signed)
Patient verbalizes understanding of discharge instructions. Opportunity for questioning and answers were provided. Armband removed by staff, pt discharged from ED.  

## 2019-08-14 NOTE — ED Triage Notes (Signed)
Pt arrives POV from home c/o clotted dialysis port. Pt reports noticing clot in port on Friday morning. Pt goes to dialysis on Tu, Thurs, and Sat. Last tx was on 08/12/19. Pt states this has happened before.

## 2019-11-30 ENCOUNTER — Telehealth: Payer: Self-pay | Admitting: *Deleted

## 2019-11-30 NOTE — Telephone Encounter (Signed)
On 11-30-19 [facility]

## 2020-07-29 ENCOUNTER — Other Ambulatory Visit: Payer: Self-pay

## 2020-07-29 ENCOUNTER — Emergency Department (HOSPITAL_COMMUNITY): Payer: No Typology Code available for payment source

## 2020-07-29 ENCOUNTER — Emergency Department (HOSPITAL_COMMUNITY)
Admission: EM | Admit: 2020-07-29 | Discharge: 2020-07-30 | Disposition: A | Discharge status: Home / Self Care | Payer: No Typology Code available for payment source | Attending: Emergency Medicine | Admitting: Emergency Medicine

## 2020-07-29 DIAGNOSIS — Z79899 Other long term (current) drug therapy: Secondary | ICD-10-CM | POA: Insufficient documentation

## 2020-07-29 DIAGNOSIS — I2510 Atherosclerotic heart disease of native coronary artery without angina pectoris: Secondary | ICD-10-CM | POA: Diagnosis not present

## 2020-07-29 DIAGNOSIS — I120 Hypertensive chronic kidney disease with stage 5 chronic kidney disease or end stage renal disease: Secondary | ICD-10-CM | POA: Diagnosis not present

## 2020-07-29 DIAGNOSIS — Y92009 Unspecified place in unspecified non-institutional (private) residence as the place of occurrence of the external cause: Secondary | ICD-10-CM | POA: Insufficient documentation

## 2020-07-29 DIAGNOSIS — F17210 Nicotine dependence, cigarettes, uncomplicated: Secondary | ICD-10-CM | POA: Diagnosis not present

## 2020-07-29 DIAGNOSIS — Z8546 Personal history of malignant neoplasm of prostate: Secondary | ICD-10-CM | POA: Insufficient documentation

## 2020-07-29 DIAGNOSIS — E1122 Type 2 diabetes mellitus with diabetic chronic kidney disease: Secondary | ICD-10-CM | POA: Diagnosis not present

## 2020-07-29 DIAGNOSIS — N186 End stage renal disease: Secondary | ICD-10-CM | POA: Insufficient documentation

## 2020-07-29 DIAGNOSIS — Z992 Dependence on renal dialysis: Secondary | ICD-10-CM | POA: Diagnosis not present

## 2020-07-29 DIAGNOSIS — M25511 Pain in right shoulder: Secondary | ICD-10-CM | POA: Diagnosis not present

## 2020-07-29 DIAGNOSIS — W108XXA Fall (on) (from) other stairs and steps, initial encounter: Secondary | ICD-10-CM | POA: Insufficient documentation

## 2020-07-29 DIAGNOSIS — Z20822 Contact with and (suspected) exposure to covid-19: Secondary | ICD-10-CM | POA: Diagnosis not present

## 2020-07-29 DIAGNOSIS — W19XXXA Unspecified fall, initial encounter: Secondary | ICD-10-CM

## 2020-07-29 DIAGNOSIS — Y9301 Activity, walking, marching and hiking: Secondary | ICD-10-CM | POA: Insufficient documentation

## 2020-07-29 DIAGNOSIS — Z7982 Long term (current) use of aspirin: Secondary | ICD-10-CM | POA: Insufficient documentation

## 2020-07-29 DIAGNOSIS — S92301A Fracture of unspecified metatarsal bone(s), right foot, initial encounter for closed fracture: Secondary | ICD-10-CM

## 2020-07-29 DIAGNOSIS — S99921A Unspecified injury of right foot, initial encounter: Secondary | ICD-10-CM | POA: Diagnosis present

## 2020-07-29 LAB — RESPIRATORY PANEL BY RT PCR (FLU A&B, COVID)
Influenza A by PCR: NEGATIVE
Influenza B by PCR: NEGATIVE
SARS Coronavirus 2 by RT PCR: NEGATIVE

## 2020-07-29 LAB — I-STAT CHEM 8, ED
BUN: 52 mg/dL — ABNORMAL HIGH (ref 8–23)
Calcium, Ion: 1.15 mmol/L (ref 1.15–1.40)
Chloride: 94 mmol/L — ABNORMAL LOW (ref 98–111)
Creatinine, Ser: 5.4 mg/dL — ABNORMAL HIGH (ref 0.61–1.24)
Glucose, Bld: 102 mg/dL — ABNORMAL HIGH (ref 70–99)
HCT: 37 % — ABNORMAL LOW (ref 39.0–52.0)
Hemoglobin: 12.6 g/dL — ABNORMAL LOW (ref 13.0–17.0)
Potassium: 4.3 mmol/L (ref 3.5–5.1)
Sodium: 136 mmol/L (ref 135–145)
TCO2: 32 mmol/L (ref 22–32)

## 2020-07-29 MED ORDER — HYDROCODONE-ACETAMINOPHEN 5-325 MG PO TABS: 1.0000 | ORAL_TABLET | ORAL | 0 refills | Status: DC | PRN

## 2020-07-29 NOTE — ED Notes (Signed)
Patient transported to X-ray 

## 2020-07-29 NOTE — ED Notes (Signed)
Ortho tech made aware of the need for cam boot.

## 2020-07-29 NOTE — Progress Notes (Signed)
Orthopedic Tech Progress Note Patient Details:  Mayank Teuscher Jan 09, 1941 [id number]  Ortho Devices Type of Ortho Device: CAM walker Ortho Device/Splint Location: RLE Ortho Device/Splint Interventions: Ordered, Application, Adjustment   Post Interventions Patient Tolerated: Well Instructions Provided: Poper ambulation with device, Care of device, Adjustment of device   Shantel Helwig 07/29/2020, 1:35 PM

## 2020-07-29 NOTE — Discharge Instructions (Addendum)
Weight-bear as tolerated by using your walker. Wear supportive boot. Follow-up with orthopedics, call on Monday to schedule an appointment with Dr. Mardelle Matte. At home, apply ice for 20 to time and elevate foot. Take Norco as needed as prescribed for pain.  For dialysis, contact your clinic on Monday and ask for a short session, otherwise follow-up on Tuesday as scheduled.  Return to ER for worsening or concerning symptoms.

## 2020-07-29 NOTE — ED Notes (Signed)
Ortho at bedside.

## 2020-07-29 NOTE — ED Triage Notes (Signed)
Pt brought to ED via EMS from home with c/o difficulty ambulating following mechanical fall 2 days ago. Pt fell while walking up stairs; hit left side of head, denies loss of consciousness. Pt states he uses a scooter at baseline. Pt reports right shoulder and right foot pain. Dialysis [facility], missed today. Alert and oriented x4.  EMS v/s: 152/102 BP 16 RR 150 CBG

## 2020-07-29 NOTE — ED Provider Notes (Signed)
[place] EMERGENCY DEPARTMENT Provider Note   CSN: [id number] Arrival date & time: 07/29/20  5929     History Chief Complaint  Patient presents with  . Fall    [name] is a 79 y.o. male.  79 year old male with history of ESRD (attends dialysis Tuesday, Thursday, Saturday- missed today- unable to get out of bed due to his pain in his foot). Fall 2 days ago at home, walking up the step into his house, right foot lost balance and he fell to the left side hitting his head on the brick wall. Denies feeling weak or dizzy prior the fall. Denies LOC. Complains of right foot pain, worse with bearing weight, also right shoulder pain (recent COVID vaccine in this arm). Denies headache, changes in vision, dizziness, CP, SHOB, weakness. Patient is not anticoagulated.         Past Medical History:  Diagnosis Date  . Chronic kidney disease    renal insufficiency  . Coronary artery disease   . Diabetes mellitus    type 2-non-insulin  . GERD (gastroesophageal reflux disease)   . Gout   . Hypertension   . Obesity   . Prostate cancer ([place])   . Tobacco abuse counseling     Patient Active Problem List   Diagnosis Date Noted  . End stage renal disease ([place])   . Anemia   . Acute combined systolic and diastolic heart failure ([place])   . CKD (chronic kidney disease) stage 5, GFR less than 15 ml/min (HCC)   . NSVT (nonsustained ventricular tachycardia) ([place])   . LBBB (left bundle branch block)   . Class 2 severe obesity due to excess calories with serious comorbidity and body mass index (BMI) of 38.0 to 38.9 in adult [facility])   . Chest pain 04/19/2019  . Shortness of breath 04/19/2019  . Malignant neoplasm of prostate ([place]) 07/06/2018  . Hypoglycemia 03/29/2017  . Coronary artery disease   . Hypertension   . Diabetes mellitus   . Chronic kidney disease     Past Surgical History:  Procedure Laterality Date  . AV FISTULA PLACEMENT Left 04/26/2019   Procedure:  INSERTION OF ARTERIOVENOUS (AV) GORE-TEX GRAFT ARM - using [employer] Graft size 45cm;  Surgeon: Rosetta Posner, MD;  Location: [facility];  Service: Vascular;  Laterality: Left;  . CARDIAC CATHETERIZATION  02/08/02  . CARDIAC CATHETERIZATION  11/20/10   drug eluting stent;mid left LAD, There is mild hypokinesis of the anterolateral wall. LVEF  is estimated at 50-55%  . INSERTION OF BRONCHIAL STENT     went in groin to R chest  . INSERTION OF DIALYSIS CATHETER Right 04/26/2019   Procedure: INSERTION OF HEMODIALYSIS CATHETER - using Palindrome Catheter size 23cm;  Surgeon: Rosetta Posner, MD;  Location: [place];  Service: Vascular;  Laterality: Right;  . LEFT HEART CATH AND CORONARY ANGIOGRAPHY N/A 04/23/2019   Procedure: LEFT HEART CATH AND CORONARY ANGIOGRAPHY;  Surgeon: Sherren Mocha, MD;  Location: [place] [facility];  Service: Cardiovascular;  Laterality: N/A;  . PENILE PROSTHESIS IMPLANT     '05       Family History  Problem Relation Age of Onset  . Aneurysm Mother   . Multiple myeloma Mother   . Aneurysm Father   . Cancer Brother   . Prostate cancer Brother   . Cancer Other   . Prostate cancer Brother   . Bone cancer Brother   . Breast cancer Neg Hx   .  Colon cancer Neg Hx   . Pancreatic cancer Neg Hx     Social History   Tobacco Use  . Smoking status: Current Every Day Smoker    Packs/day: 0.50    Years: 50.00    Pack years: 25.00    Types: Cigarettes  . Smokeless tobacco: Never Used  Vaping Use  . Vaping Use: Never used  Substance Use Topics  . Alcohol use: Not Currently    Comment: drank 5 beers 03-28-17  . Drug use: Not Currently    Types: Cocaine, Marijuana    Home Medications Prior to Admission medications   Medication Sig Start Date End Date Taking? Authorizing Provider  allopurinol (ZYLOPRIM) 100 MG tablet Take 100 mg by mouth 2 (two) times daily.     [provider]  amLODipine (NORVASC) 5 MG tablet Take 5 mg by mouth daily.    [provider]  aspirin EC 81 MG tablet Take 81 mg by mouth daily.    [provider]  atorvastatin (LIPITOR) 40 MG tablet Take 40 mg by mouth at bedtime.    [provider]  calcitRIOL (ROCALTROL) 0.25 MCG capsule Take 0.5 mcg by mouth daily.    [provider]  carvedilol (COREG) 6.25 MG tablet Take 1 tablet (6.25 mg total) by mouth 2 (two) times daily with a meal. 04/28/19   Shelly Coss, MD  cholecalciferol (VITAMIN D) 1000 UNITS tablet Take 1,000 Units by mouth daily.    [provider]  docusate sodium (COLACE) 100 MG capsule Take 100 mg by mouth daily.    [provider]  gabapentin (NEURONTIN) 400 MG capsule Take 400 mg by mouth at bedtime.    [provider]  HYDROcodone-acetaminophen (NORCO/VICODIN) 5-325 MG tablet Take 1 tablet by mouth every 4 (four) hours as needed. 07/29/20   Tacy Learn, PA-C  isosorbide-hydrALAZINE (BIDIL) 20-37.5 MG tablet Take 1 tablet by mouth 3 (three) times daily. Patient not taking: Reported on 07/17/2019 04/28/19   Shelly Coss, MD  losartan (COZAAR) 100 MG tablet Take 100 mg by mouth daily.    [provider]  nitroGLYCERIN (NITROSTAT) 0.4 MG SL tablet Place 0.4 mg under the tongue every 5 (five) minutes as needed for chest pain. For chest pain     [provider]  omeprazole (PRILOSEC) 20 MG capsule Take 20 mg by mouth daily.    [provider]    Allergies    Patient has no known allergies.  Review of Systems   Review of Systems  Constitutional: Negative for fever.  Eyes: Negative for visual disturbance.  Respiratory: Negative for shortness of breath.   Cardiovascular: Negative for chest pain.  Gastrointestinal: Negative for abdominal pain, nausea and vomiting.  Musculoskeletal: Positive for arthralgias and gait problem. Negative for back pain, neck pain and neck stiffness.  Skin: Negative for rash and wound.  Allergic/Immunologic: Negative for immunocompromised state.   Neurological: Negative for dizziness, syncope, speech difficulty, weakness, light-headedness and headaches.  Hematological: Does not bruise/bleed easily.  Psychiatric/Behavioral: Negative for confusion.  All other systems reviewed and are negative.   Physical Exam Updated Vital Signs BP 131/82   Pulse (!) 122   Temp 98 F (36.7 C)   Resp (!) 80   Ht _0  (1.727 m)   Wt 90 kg   SpO2 98%   BMI 30.17 kg/m   Physical Exam Vitals and nursing note reviewed.  Constitutional:      General: He is not in acute  distress.    Appearance: He is well-developed. He is not diaphoretic.  HENT:     Head: Normocephalic and atraumatic.     Mouth/Throat:     Mouth: Mucous membranes are moist.  Eyes:     Conjunctiva/sclera: Conjunctivae normal.  Cardiovascular:     Rate and Rhythm: Normal rate and regular rhythm.     Pulses: Normal pulses.     Heart sounds: Normal heart sounds.  Pulmonary:     Effort: Pulmonary effort is normal.     Breath sounds: Normal breath sounds.  Abdominal:     Palpations: Abdomen is soft.     Tenderness: There is no abdominal tenderness.  Musculoskeletal:        General: Tenderness present. No swelling or deformity.     Cervical back: Normal range of motion and neck supple.     Comments: Left foot normal on exam, no specific tenderness however reports general foot pain. Right foot with moderate generalized tenderness, warm to the touch compared to the left foot. Pulses present and equal.   Skin:    General: Skin is warm and dry.     Findings: No erythema or rash.  Neurological:     General: No focal deficit present.     Mental Status: He is alert and oriented to person, place, and time.     Sensory: No sensory deficit.     Motor: No weakness.  Psychiatric:        Behavior: Behavior normal.     ED Results / Procedures / Treatments   Labs (all labs ordered are listed, but only abnormal results are displayed) Labs Reviewed  I-STAT CHEM 8, ED - Abnormal;  Notable for the following components:      Result Value   Chloride 94 (*)    BUN 52 (*)    Creatinine, Ser 5.40 (*)    Glucose, Bld 102 (*)    Hemoglobin 12.6 (*)    HCT 37.0 (*)    All other components within normal limits  RESPIRATORY PANEL BY RT PCR (FLU A&B, COVID)    EKG EKG Interpretation  Date/Time:  Saturday July 29 2020 09:37:31 EDT Ventricular Rate:  117 PR Interval:    QRS Duration: 147 QT Interval:  395 QTC Calculation: 552 R Axis:   -83 Text Interpretation: Sinus arrhythmia Ventricular premature complex Nonspecific IVCD with LAD Anteroseptal infarct, old Abnormal T, consider ischemia, lateral leads similar morphology compared to old Confirmed by Fredia Sorrow [phone]) on 07/29/2020 11:22:43 AM   Radiology DG Chest Port 1 View  Result Date: 07/29/2020 CLINICAL DATA:  Dialysis patient, fall 2 days prior EXAM: PORTABLE CHEST 1 VIEW COMPARISON:  07/29/2019 chest radiograph. FINDINGS: Stable cardiomediastinal silhouette with moderate cardiomegaly. No pneumothorax. No pleural effusion. Lungs appear clear, with no acute consolidative airspace disease and no pulmonary edema. IMPRESSION: Moderate cardiomegaly without pulmonary edema. No active pulmonary disease. Electronically Signed   By: Ilona Sorrel M.D.   On: 07/29/2020 12:22   DG Foot Complete Left  Result Date: 07/29/2020 CLINICAL DATA:  Fall.  Pain.  Left foot neuropathy. EXAM: LEFT FOOT - COMPLETE 3+ VIEW COMPARISON:  05/07/15 FINDINGS: The bones appear osteopenic. Stable chronic deformities involving the third through fifth proximal phalanges. No signs of acute fracture or dislocation. Soft tissue calcification in the projection of the Achilles tendon is unchanged from previous exam. IMPRESSION: 1. No acute findings. 2. Chronic deformities of the third through fifth proximal phalanges. 3. Chronic Achilles tendon calcification. Electronically Signed  By: Kerby Moors M.D.   On: 07/29/2020 11:14   DG Foot Complete  Right  Result Date: 07/29/2020 CLINICAL DATA:  Fall 3 days prior with right foot pain EXAM: RIGHT FOOT COMPLETE - 3+ VIEW COMPARISON:  None. FINDINGS: Nondisplaced non articular fractures of the distal metaphysis ease of the second, third and fourth right metatarsals. No additional fractures. No dislocation. No focal osseous lesions. No osseous erosions. Mild hallux valgus deformity with mild first MTP joint osteoarthritis. No radiopaque foreign bodies. IMPRESSION: Nondisplaced non articular fractures of the distal metaphyses of the second, third and fourth right metatarsals. Electronically Signed   By: Ilona Sorrel M.D.   On: 07/29/2020 11:15    Procedures Procedures (including critical care time)  Medications Ordered in ED Medications - No data to display  ED Course  I have reviewed the triage vital signs and the nursing notes.  Pertinent labs & imaging results that were available during my care of the patient were reviewed by me and considered in my medical decision making (see chart for details).  Clinical Course as of Jul 30 1516  Sat Jul 29, 7225  [id number] 79 year old male brought in by EMS from home for right foot pain after fall 2 days ago.  On exam, has moderate right foot tenderness, right foot is slightly warm to the touch compared to left, sensation intact, DP pulses present. Patient has a history of neuropathy, both feet x-rayed due to report of pain in both feet although pain worse than right.  X-ray of left foot with chronic changes, x-ray of the right foot shows nondisplaced fractures of metatarsals 2, 3, 4.  Discussed results with patient, he lives at home with his girlfriend, does have a walker, feels like he should be able to go home today and care for him self with her help.  Plan is to discuss patient's injury with Ortho, if patient is able to be discharged home with his plan to use a walker, will then arrange for dialysis.   [LM]  1209 Case discussed with Dr. Mardelle Matte with  orthopedics, patient may weight-bear as tolerated, may be discharged home with walker if he would like to follow-up in the office on Monday or Wednesday, may be placed in a [place] shoe or cam boot.  Patient prefers cam boot. Plan is to check i-STAT CHEM and consult nephrology, patient attends dialysis at the [place], while the facility is open for dialysis, there is no phone service to contact and arrange dialysis for this patient and he may not walk-in for service tomorrow.   [LM]    Clinical Course User Index [LM] Roque Lias   MDM Rules/Calculators/A&P                          Final Clinical Impression(s) / ED Diagnoses Final diagnoses:  Fall, initial encounter  Closed nondisplaced fracture of metatarsal bone of right foot, unspecified metatarsal, initial encounter  Dialysis patient [facility])    Rx / DC Orders ED Discharge Orders         Ordered    HYDROcodone-acetaminophen (NORCO/VICODIN) 5-325 MG tablet  Every 4 hours PRN        07/29/20 1458           Tacy Learn, PA-C 07/30/20 1517    Fredia Sorrow, MD 08/29/20 1446

## 2020-08-06 IMAGING — CR CHEST - 2 VIEW
2 series · 2 of 2 positions shown · non-contrast
Comparison: 03/15/2019

CLINICAL DATA: Short of breath

EXAM:
CHEST - 2 VIEW

[w chest pa]
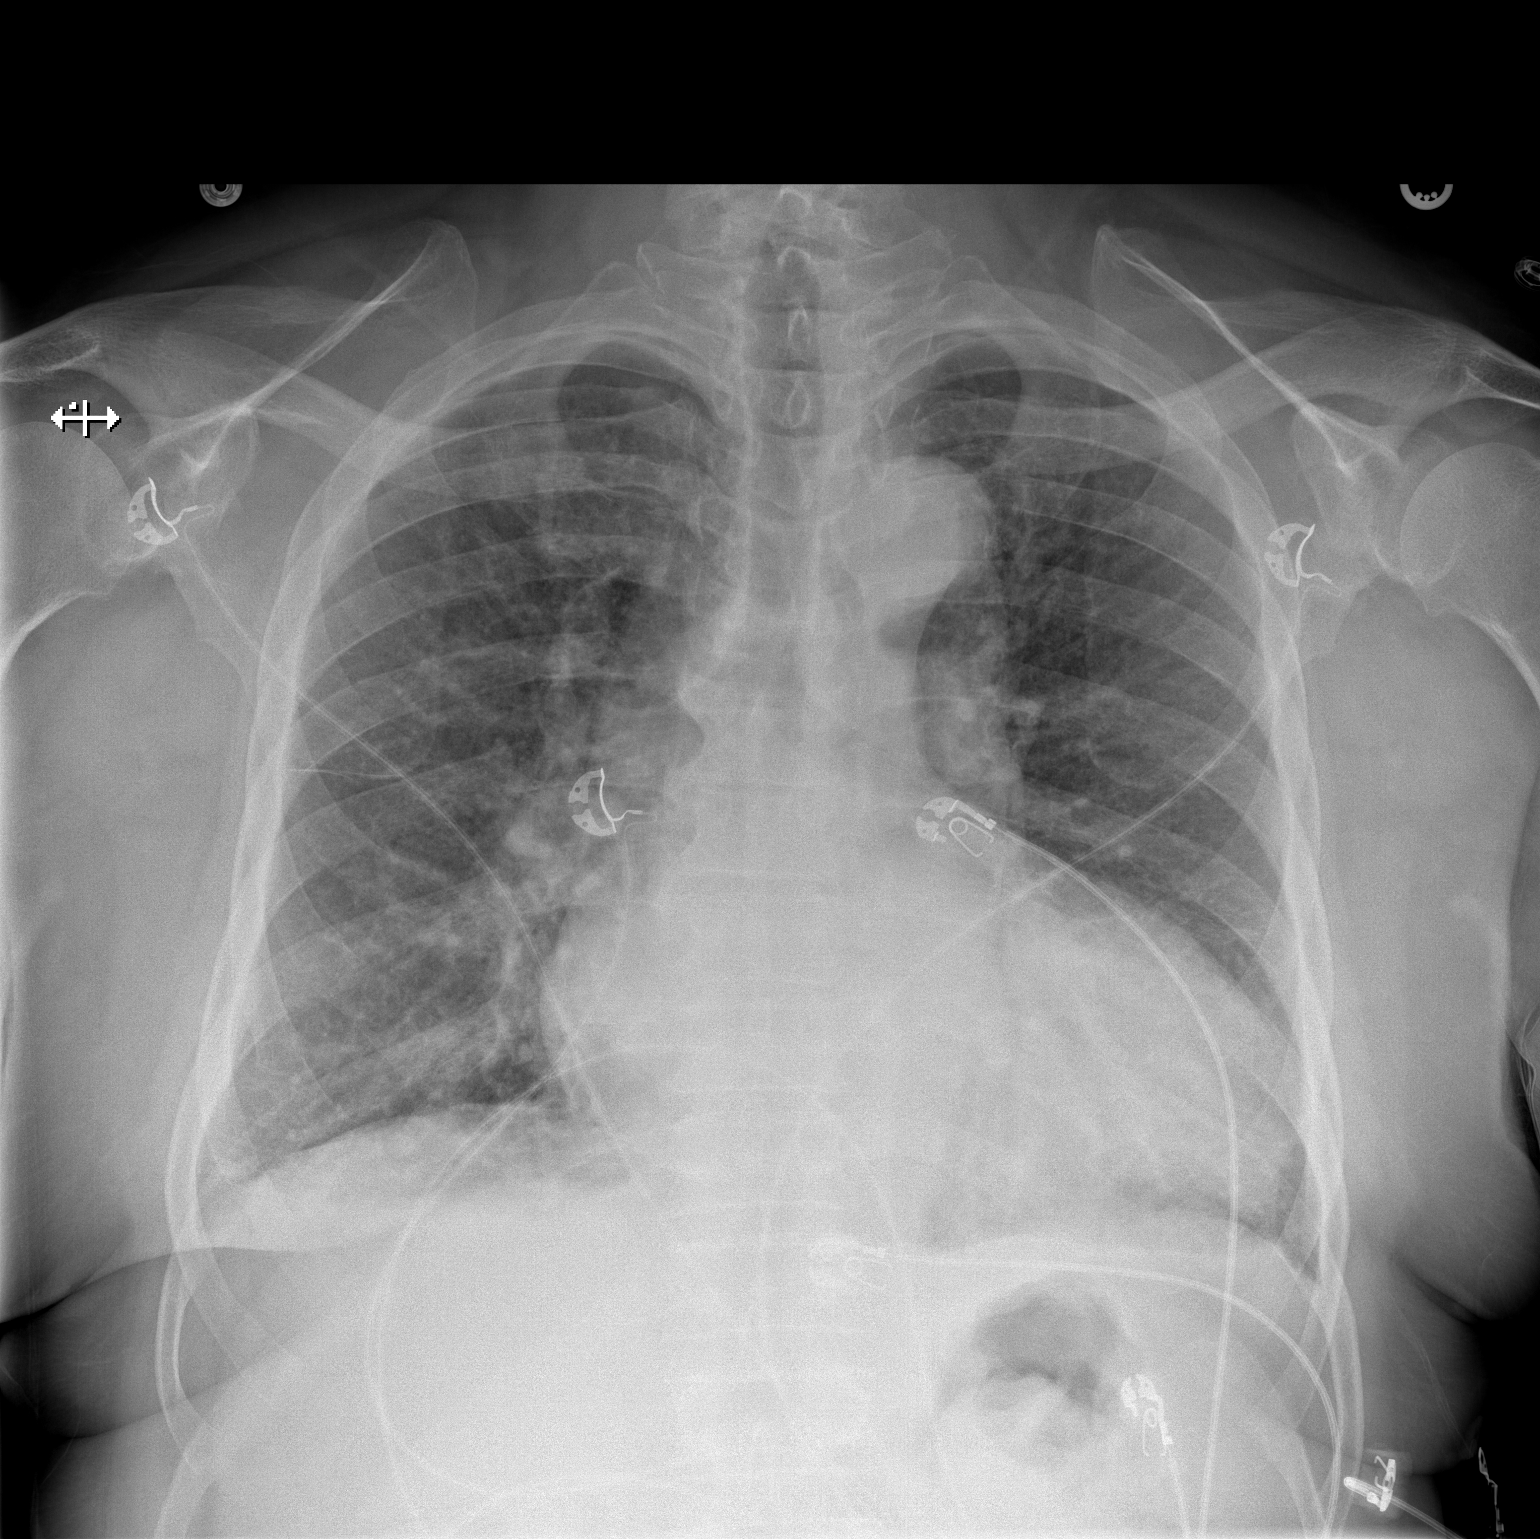

[w chest lat]
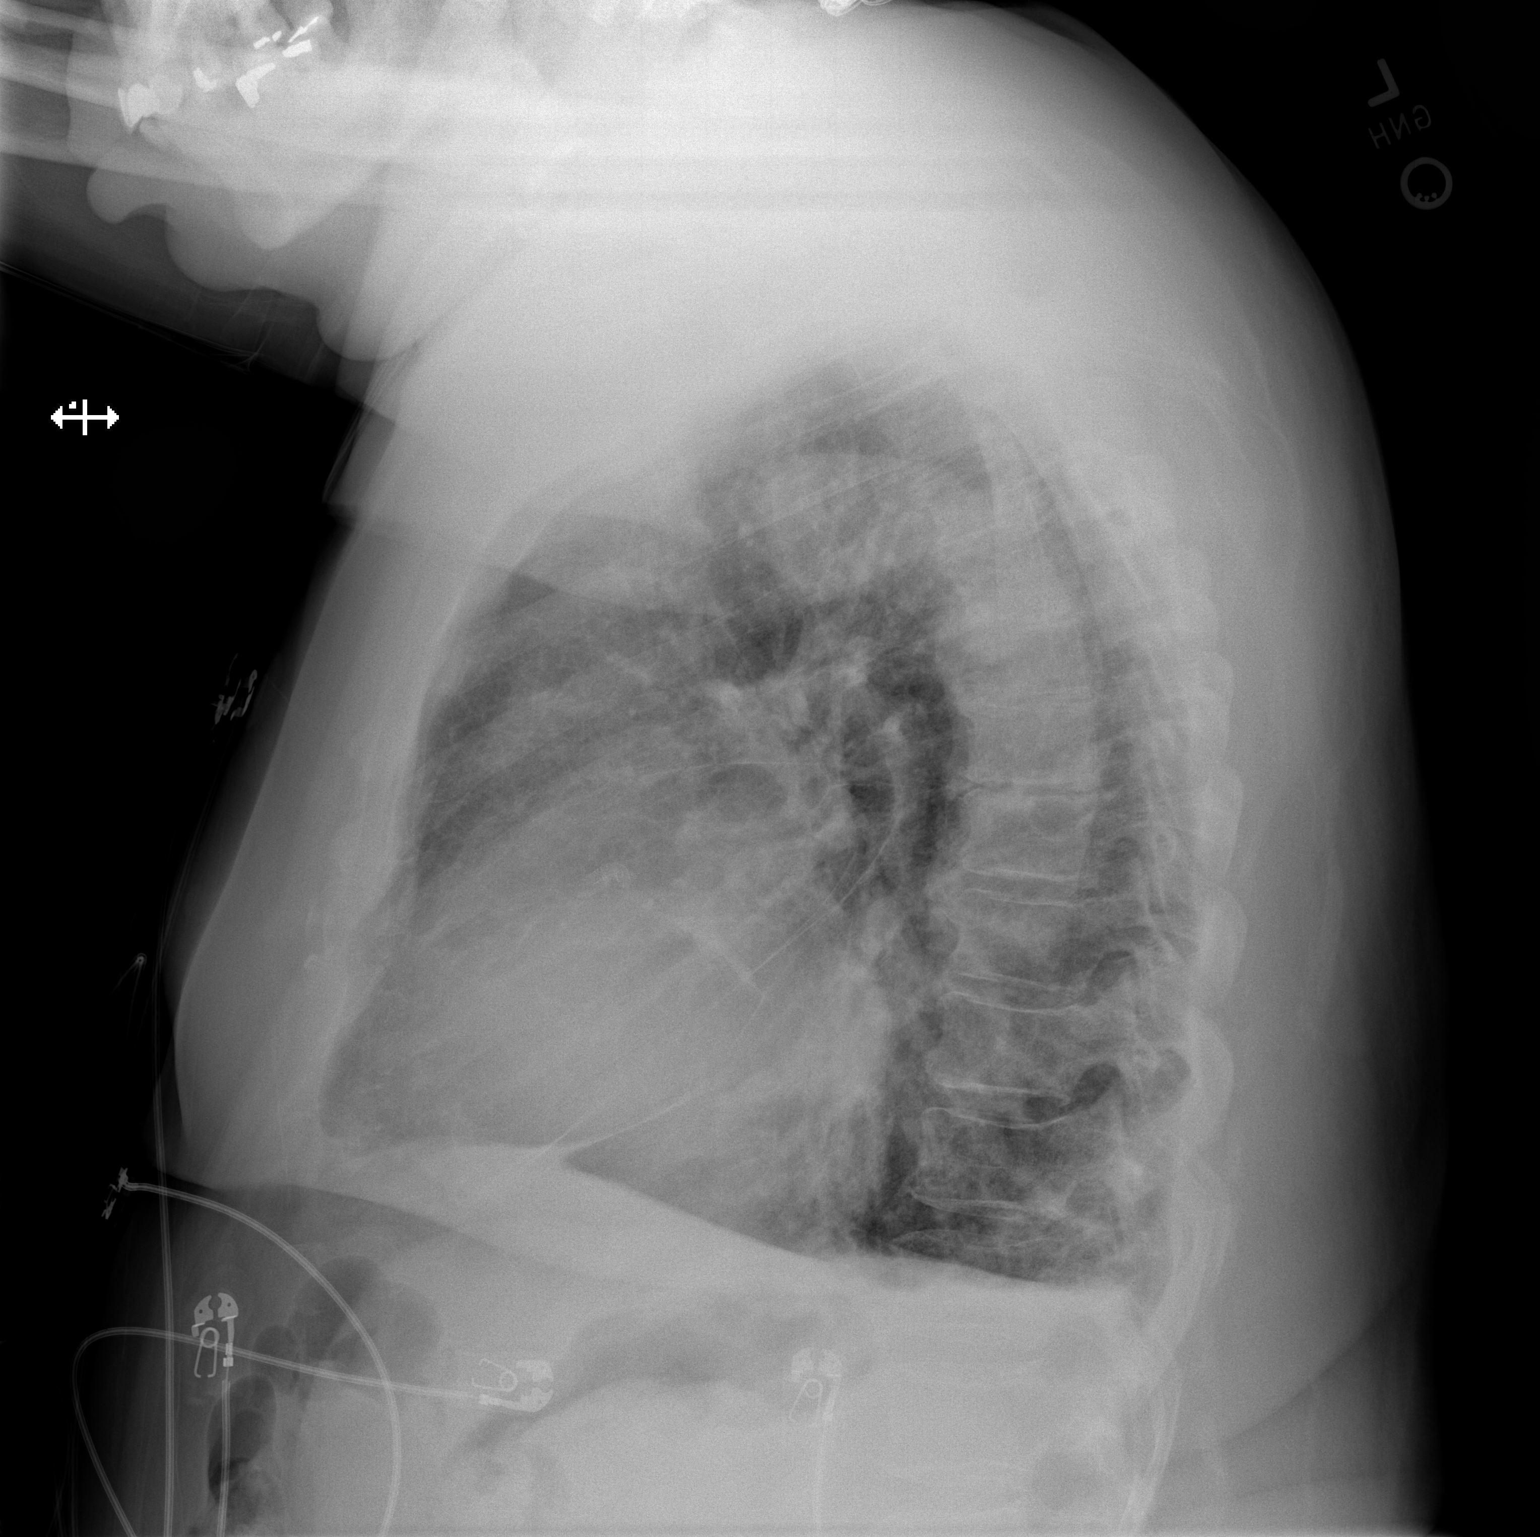

[2 of 2 positions shown; findings below may reference images not displayed]

FINDINGS: Cardiac enlargement without heart failure or edema. No infiltrate or
effusion. Lungs remain hyperinflated and unchanged from the prior
study.
IMPRESSION: Cardiac enlargement without acute abnormality.

## 2020-08-13 IMAGING — DX CHEST  1 VIEW
1 series · 1 of 1 positions shown · non-contrast
Comparison: 04/25/2019

CLINICAL DATA: Encounter for central line placement.

EXAM:
CHEST  1 VIEW

[chest ap]
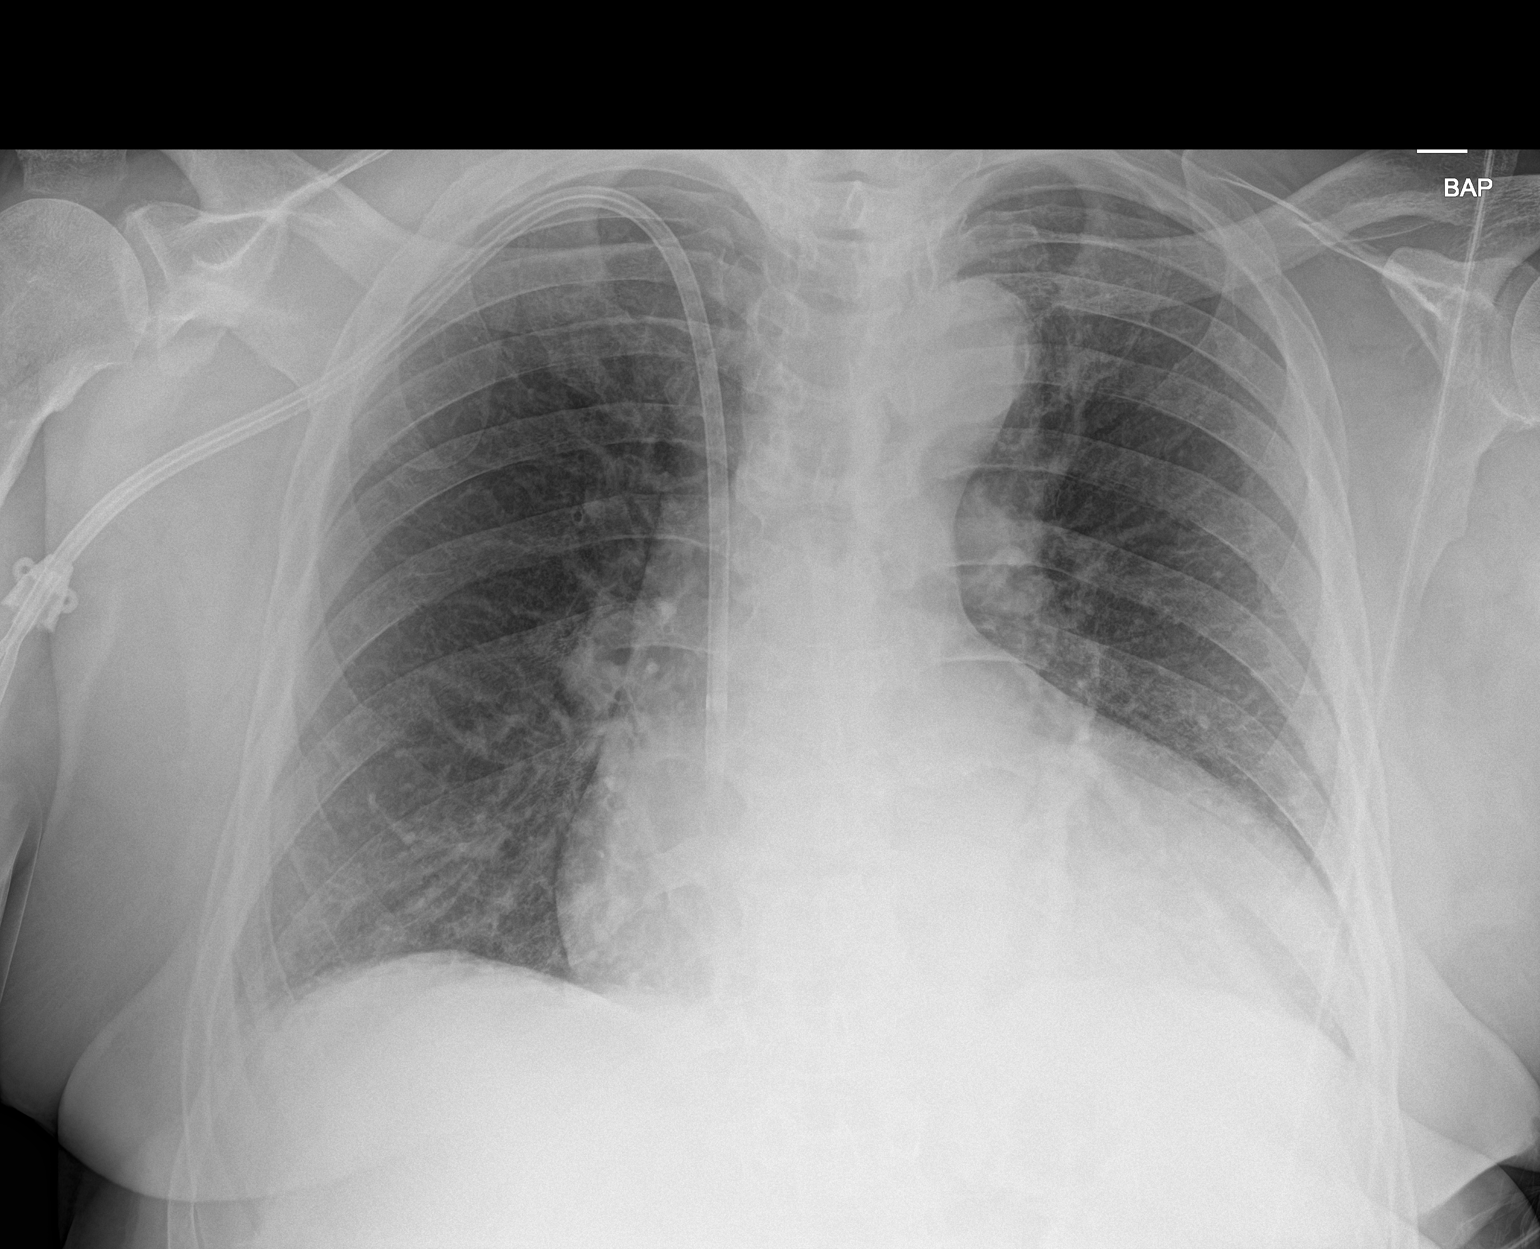

[1 of 1 positions shown; findings below may reference images not displayed]

FINDINGS: Right jugular dialysis catheter has been placed. Catheter tip is at
the junction of the SVC and right atrium. Enlargement of the cardiac
silhouette without pulmonary edema. Negative for a pneumothorax. The
trachea is midline.
IMPRESSION: Placement of right jugular dialysis catheter. Catheter tip at the
SVC and right atrium junction.

Negative for a pneumothorax.

Enlargement of the cardiac silhouette that may be accentuated by the
technique.

## 2020-08-15 IMAGING — DX PORTABLE CHEST - 1 VIEW
1 series · 1 of 1 positions shown · non-contrast
Comparison: 04/26/2019.

CLINICAL DATA: Shortness of breath.

EXAM:
PORTABLE CHEST 1 VIEW

[chest ap]
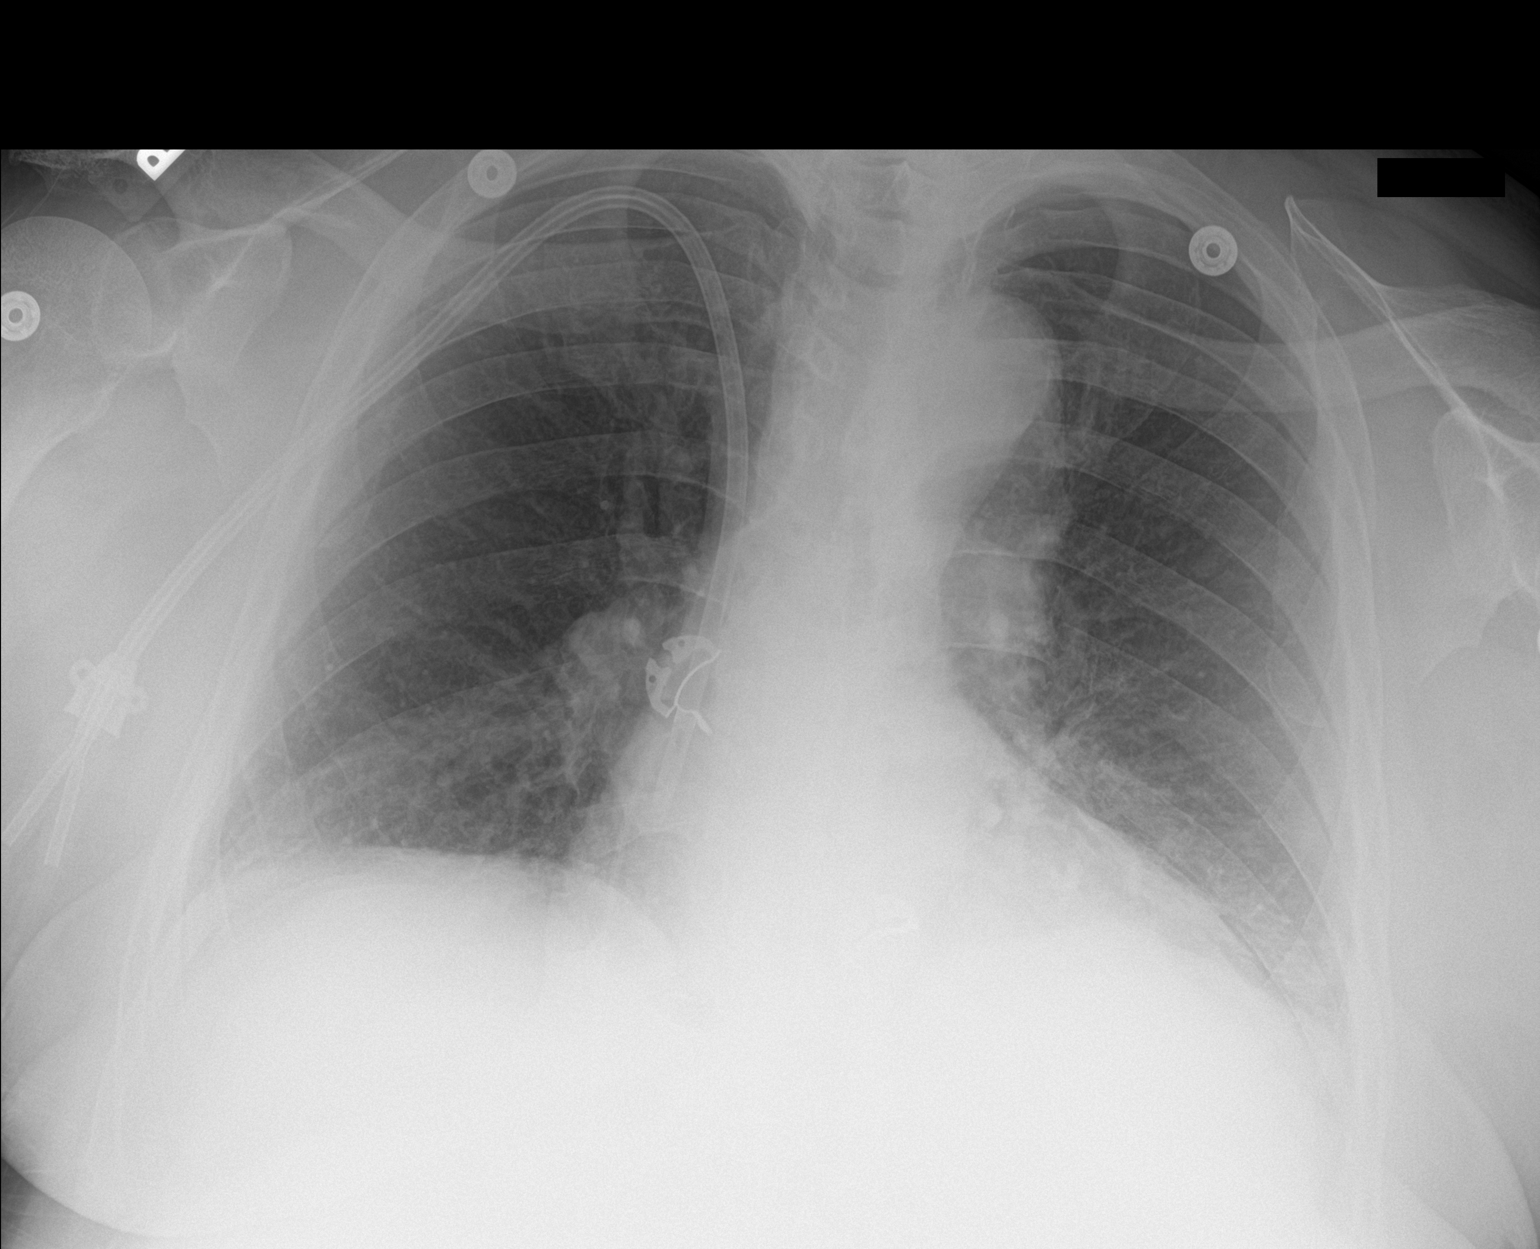

[1 of 1 positions shown; findings below may reference images not displayed]

FINDINGS: Dialysis catheter stable position. Stable cardiomegaly. Low lung
volumes with mild bibasilar atelectasis. Mild bibasilar
infiltrates/edema cannot be excluded. Tiny bilateral pleural
effusions cannot be excluded. No pneumothorax.
IMPRESSION: 1.  Dialysis catheter stable position.  No pneumothorax.

2.  Cardiomegaly.

3. Mild bibasilar atelectasis. Mild bibasilar infiltrates/edema
cannot be excluded. Tiny bilateral pleural effusions cannot be
excluded.

## 2020-10-05 ENCOUNTER — Other Ambulatory Visit: Payer: Self-pay

## 2020-10-05 ENCOUNTER — Emergency Department (HOSPITAL_COMMUNITY)
Admission: EM | Admit: 2020-10-05 | Discharge: 2020-10-06 | Disposition: A | Discharge status: Home / Self Care | Payer: No Typology Code available for payment source | Attending: Emergency Medicine | Admitting: Emergency Medicine

## 2020-10-05 ENCOUNTER — Encounter (HOSPITAL_COMMUNITY): Payer: Self-pay | Admitting: Emergency Medicine

## 2020-10-05 DIAGNOSIS — Z955 Presence of coronary angioplasty implant and graft: Secondary | ICD-10-CM | POA: Insufficient documentation

## 2020-10-05 DIAGNOSIS — N186 End stage renal disease: Secondary | ICD-10-CM | POA: Insufficient documentation

## 2020-10-05 DIAGNOSIS — I2510 Atherosclerotic heart disease of native coronary artery without angina pectoris: Secondary | ICD-10-CM | POA: Diagnosis not present

## 2020-10-05 DIAGNOSIS — Z79899 Other long term (current) drug therapy: Secondary | ICD-10-CM | POA: Diagnosis not present

## 2020-10-05 DIAGNOSIS — R0602 Shortness of breath: Secondary | ICD-10-CM | POA: Diagnosis present

## 2020-10-05 DIAGNOSIS — Z20822 Contact with and (suspected) exposure to covid-19: Secondary | ICD-10-CM | POA: Insufficient documentation

## 2020-10-05 DIAGNOSIS — Z7982 Long term (current) use of aspirin: Secondary | ICD-10-CM | POA: Insufficient documentation

## 2020-10-05 DIAGNOSIS — F17210 Nicotine dependence, cigarettes, uncomplicated: Secondary | ICD-10-CM | POA: Diagnosis not present

## 2020-10-05 DIAGNOSIS — I5041 Acute combined systolic (congestive) and diastolic (congestive) heart failure: Secondary | ICD-10-CM | POA: Diagnosis not present

## 2020-10-05 DIAGNOSIS — J189 Pneumonia, unspecified organism: Secondary | ICD-10-CM

## 2020-10-05 DIAGNOSIS — R609 Edema, unspecified: Secondary | ICD-10-CM | POA: Insufficient documentation

## 2020-10-05 DIAGNOSIS — J181 Lobar pneumonia, unspecified organism: Secondary | ICD-10-CM | POA: Insufficient documentation

## 2020-10-05 DIAGNOSIS — Z8546 Personal history of malignant neoplasm of prostate: Secondary | ICD-10-CM | POA: Insufficient documentation

## 2020-10-05 DIAGNOSIS — I132 Hypertensive heart and chronic kidney disease with heart failure and with stage 5 chronic kidney disease, or end stage renal disease: Secondary | ICD-10-CM | POA: Diagnosis not present

## 2020-10-05 LAB — BASIC METABOLIC PANEL
Anion gap: 14 (ref 5–15)
BUN: 14 mg/dL (ref 8–23)
CO2: 28 mmol/L (ref 22–32)
Calcium: 9.4 mg/dL (ref 8.9–10.3)
Chloride: 94 mmol/L — ABNORMAL LOW (ref 98–111)
Creatinine, Ser: 3.08 mg/dL — ABNORMAL HIGH (ref 0.61–1.24)
GFR, Estimated: 20 mL/min — ABNORMAL LOW (ref >60)
Glucose, Bld: 167 mg/dL — ABNORMAL HIGH (ref 70–99)
Potassium: 4.1 mmol/L (ref 3.5–5.1)
Sodium: 136 mmol/L (ref 135–145)

## 2020-10-05 LAB — RESP PANEL BY RT-PCR (FLU A&B, COVID) ARPGX2
Influenza A by PCR: NEGATIVE
Influenza B by PCR: NEGATIVE
SARS Coronavirus 2 by RT PCR: NEGATIVE

## 2020-10-05 LAB — CBC
HCT: 44.4 % (ref 39.0–52.0)
Hemoglobin: 14.1 g/dL (ref 13.0–17.0)
MCH: 32.8 pg (ref 26.0–34.0)
MCHC: 31.8 g/dL (ref 30.0–36.0)
MCV: 103.3 fL — ABNORMAL HIGH (ref 80.0–100.0)
Platelets: 97 10*3/uL — ABNORMAL LOW (ref 150–400)
RBC: 4.3 MIL/uL (ref 4.22–5.81)
RDW: 15.7 % — ABNORMAL HIGH (ref 11.5–15.5)
WBC: 6 10*3/uL (ref 4.0–10.5)
nRBC: 0 % (ref 0.0–0.2)

## 2020-10-05 NOTE — ED Triage Notes (Signed)
Patient here because he was told at his dialysis center that he has pneumonia. Patient has nonproductive cough and shortness of breath.

## 2020-10-06 ENCOUNTER — Emergency Department (HOSPITAL_COMMUNITY): Payer: No Typology Code available for payment source

## 2020-10-06 ENCOUNTER — Other Ambulatory Visit (HOSPITAL_COMMUNITY): Payer: No Typology Code available for payment source

## 2020-10-06 MED ORDER — SODIUM CHLORIDE 0.9 % IV SOLN: 1.0000 g | Freq: Once | INTRAVENOUS | Status: DC

## 2020-10-06 MED ORDER — PREDNISONE 20 MG PO TABS
40.0000 mg | ORAL_TABLET | Freq: Once | ORAL | Status: AC
  Administered 2020-10-06: 40 mg via ORAL
  Filled 2020-10-06: qty 2

## 2020-10-06 MED ORDER — DOXYCYCLINE HYCLATE 100 MG PO TABS
100.0000 mg | ORAL_TABLET | Freq: Once | ORAL | Status: AC
  Administered 2020-10-06: 100 mg via ORAL
  Filled 2020-10-06: qty 1

## 2020-10-06 MED ORDER — AEROCHAMBER PLUS FLO-VU LARGE MISC
1.0000 | Freq: Once | Status: AC
  Administered 2020-10-06: 1

## 2020-10-06 MED ORDER — DOXYCYCLINE HYCLATE 100 MG PO CAPS: 100.0000 mg | ORAL_CAPSULE | Freq: Two times a day (BID) | ORAL | 0 refills | Status: DC

## 2020-10-06 MED ORDER — IPRATROPIUM BROMIDE HFA 17 MCG/ACT IN AERS
2.0000 | INHALATION_SPRAY | Freq: Once | RESPIRATORY_TRACT | Status: AC
  Administered 2020-10-06: 2 via RESPIRATORY_TRACT
  Filled 2020-10-06: qty 12.9

## 2020-10-06 MED ORDER — ALBUTEROL SULFATE HFA 108 (90 BASE) MCG/ACT IN AERS
6.0000 | INHALATION_SPRAY | Freq: Once | RESPIRATORY_TRACT | Status: AC
  Administered 2020-10-06: 6 via RESPIRATORY_TRACT
  Filled 2020-10-06: qty 6.7

## 2020-10-06 NOTE — ED Notes (Signed)
Patient verbalizes understanding of discharge instructions. Opportunity for questioning and answers were provided. Armband removed by staff, pt discharged from ED via wheelchair to lobby to go home with significant other.

## 2020-10-06 NOTE — ED Provider Notes (Signed)
[facility] EMERGENCY DEPARTMENT Provider Note  CSN: [id number] Arrival date & time: 10/05/20 1603  Chief Complaint(s) Shortness of Breath  HPI [name] is a 80 y.o. male with a past medical history listed below including ESRD on dialysis TTS who came after his dialysis session for shortness of breath. Patient was told he had pneumonia after he received a chest x-ray and instructed to present for further evaluation. Patient endorses several months of cough. No known fevers or chills. No nausea or vomiting. No chest pain. Patient completed his dialysis session today.  HPI  Past Medical History Past Medical History:  Diagnosis Date  . Chronic kidney disease    renal insufficiency  . Coronary artery disease   . Diabetes mellitus    type 2-non-insulin  . GERD (gastroesophageal reflux disease)   . Gout   . Hypertension   . Obesity   . Prostate cancer ([place])   . Tobacco abuse counseling    Patient Active Problem List   Diagnosis Date Noted  . End stage renal disease ([place])   . Anemia   . Acute combined systolic and diastolic heart failure ([place])   . CKD (chronic kidney disease) stage 5, GFR less than 15 ml/min (HCC)   . NSVT (nonsustained ventricular tachycardia) ([place])   . LBBB (left bundle branch block)   . Class 2 severe obesity due to excess calories with serious comorbidity and body mass index (BMI) of 38.0 to 38.9 in adult [facility])   . Chest pain 04/19/2019  . Shortness of breath 04/19/2019  . Malignant neoplasm of prostate ([place]) 07/06/2018  . Hypoglycemia 03/29/2017  . Coronary artery disease   . Hypertension   . Diabetes mellitus   . Chronic kidney disease    Home Medication(s) Prior to Admission medications   Medication Sig Start Date End Date Taking? Authorizing Provider  doxycycline (VIBRAMYCIN) 100 MG capsule Take 1 capsule (100 mg total) by mouth 2 (two) times daily. 10/06/20  Yes Breasia Karges, Grayce Sessions, MD  allopurinol (ZYLOPRIM) 100 MG  tablet Take 100 mg by mouth 2 (two) times daily.     [provider]  amLODipine (NORVASC) 5 MG tablet Take 5 mg by mouth daily.    [provider]  aspirin EC 81 MG tablet Take 81 mg by mouth daily.    [provider]  atorvastatin (LIPITOR) 40 MG tablet Take 40 mg by mouth at bedtime.    [provider]  calcitRIOL (ROCALTROL) 0.25 MCG capsule Take 0.5 mcg by mouth daily.    [provider]  carvedilol (COREG) 6.25 MG tablet Take 1 tablet (6.25 mg total) by mouth 2 (two) times daily with a meal. 04/28/19   Shelly Coss, MD  cholecalciferol (VITAMIN D) 1000 UNITS tablet Take 1,000 Units by mouth daily.    [provider]  docusate sodium (COLACE) 100 MG capsule Take 100 mg by mouth daily.    [provider]  gabapentin (NEURONTIN) 400 MG capsule Take 400 mg by mouth at bedtime.    [provider]  HYDROcodone-acetaminophen (NORCO/VICODIN) 5-325 MG tablet Take 1 tablet by mouth every 4 (four) hours as needed. 07/29/20   Tacy Learn, PA-C  isosorbide-hydrALAZINE (BIDIL) 20-37.5 MG tablet Take 1 tablet by mouth 3 (three) times daily. Patient not taking: Reported on 07/17/2019 04/28/19   Shelly Coss, MD  losartan (COZAAR) 100 MG tablet Take 100 mg by mouth daily.    [provider]  nitroGLYCERIN (NITROSTAT) 0.4 MG SL tablet  Place 0.4 mg under the tongue every 5 (five) minutes as needed for chest pain. For chest pain     [provider]  omeprazole (PRILOSEC) 20 MG capsule Take 20 mg by mouth daily.    [provider]                                                                                                                                    Past Surgical History Past Surgical History:  Procedure Laterality Date  . AV FISTULA PLACEMENT Left 04/26/2019   Procedure: INSERTION OF ARTERIOVENOUS (AV) GORE-TEX GRAFT ARM - using Gore Vascular Graft size 45cm;  Surgeon: Rosetta Posner, MD;  Location:  [place];  Service: Vascular;  Laterality: Left;  . CARDIAC CATHETERIZATION  02/08/02  . CARDIAC CATHETERIZATION  11/20/10   drug eluting stent;mid left LAD, There is mild hypokinesis of the anterolateral wall. LVEF  is estimated at 50-55%  . INSERTION OF BRONCHIAL STENT     went in groin to R chest  . INSERTION OF DIALYSIS CATHETER Right 04/26/2019   Procedure: INSERTION OF HEMODIALYSIS CATHETER - using Palindrome Catheter size 23cm;  Surgeon: Rosetta Posner, MD;  Location: [place];  Service: Vascular;  Laterality: Right;  . LEFT HEART CATH AND CORONARY ANGIOGRAPHY N/A 04/23/2019   Procedure: LEFT HEART CATH AND CORONARY ANGIOGRAPHY;  Surgeon: Sherren Mocha, MD;  Location: [facility];  Service: Cardiovascular;  Laterality: N/A;  . PENILE PROSTHESIS IMPLANT     '05   Family History Family History  Problem Relation Age of Onset  . Aneurysm Mother   . Multiple myeloma Mother   . Aneurysm Father   . Cancer Brother   . Prostate cancer Brother   . Cancer Other   . Prostate cancer Brother   . Bone cancer Brother   . Breast cancer Neg Hx   . Colon cancer Neg Hx   . Pancreatic cancer Neg Hx     Social History Social History   Tobacco Use  . Smoking status: Current Every Day Smoker    Packs/day: 0.50    Years: 50.00    Pack years: 25.00    Types: Cigarettes  . Smokeless tobacco: Never Used  Vaping Use  . Vaping Use: Never used  Substance Use Topics  . Alcohol use: Not Currently    Comment: drank 5 beers 03-28-17  . Drug use: Not Currently    Types: Cocaine, Marijuana   Allergies Patient has no known allergies.  Review of Systems Review of Systems All other systems are reviewed and are negative for acute change except as noted in the HPI  Physical Exam Vital Signs  I have reviewed the triage vital signs BP (!) 128/97 (BP Location: Right Arm)   Pulse 80   Temp 98.6 F (37 C) (Oral)   Resp 14   Ht _0  (1.727 m)   Wt 90.7 kg  SpO2 97%   BMI 30.41 kg/m    Physical Exam Vitals reviewed.  Constitutional:      General: He is not in acute distress.    Appearance: He is well-developed and well-nourished. He is not diaphoretic.  HENT:     Head: Normocephalic and atraumatic.     Nose: Nose normal.  Eyes:     General: No scleral icterus.       Right eye: No discharge.        Left eye: No discharge.     Extraocular Movements: EOM normal.     Conjunctiva/sclera: Conjunctivae normal.     Pupils: Pupils are equal, round, and reactive to light.  Cardiovascular:     Rate and Rhythm: Normal rate and regular rhythm.     Heart sounds: No murmur heard. No friction rub. No gallop.   Pulmonary:     Effort: Pulmonary effort is normal. No respiratory distress.     Breath sounds: No stridor. Examination of the right-middle field reveals wheezing. Examination of the left-middle field reveals wheezing. Examination of the right-lower field reveals wheezing and rales. Examination of the left-lower field reveals wheezing. Wheezing and rales present.  Abdominal:     General: There is no distension.     Palpations: Abdomen is soft.     Tenderness: There is no abdominal tenderness.  Musculoskeletal:        General: No tenderness.     Cervical back: Normal range of motion and neck supple.     Right lower leg: 1+ Pitting Edema present.     Left lower leg: 1+ Pitting Edema present.  Skin:    General: Skin is warm and dry.     Findings: No erythema or rash.  Neurological:     Mental Status: He is alert and oriented to person, place, and time.  Psychiatric:        Mood and Affect: Mood and affect normal.     ED Results and Treatments Labs (all labs ordered are listed, but only abnormal results are displayed) Labs Reviewed  BASIC METABOLIC PANEL - Abnormal; Notable for the following components:      Result Value   Chloride 94 (*)    Glucose, Bld 167 (*)    Creatinine, Ser 3.08 (*)    GFR, Estimated 20 (*)    All other components within normal limits   CBC - Abnormal; Notable for the following components:   MCV 103.3 (*)    RDW 15.7 (*)    Platelets 97 (*)    All other components within normal limits  RESP PANEL BY RT-PCR (FLU A&B, COVID) ARPGX2                                                                                                                         EKG  EKG Interpretation  Date/Time:  Thursday October 05 2020 16:32:21 EST Ventricular Rate:  89 PR Interval:  238 QRS Duration: 128 QT Interval:  414  QTC Calculation: 503 R Axis:   -178 Text Interpretation: Sinus rhythm with sinus arrhythmia with 1st degree A-V block Possible Left atrial enlargement Right bundle branch block Possible Lateral infarct , age undetermined Abnormal ECG Confirmed by Addison Lank [phone]) on 10/05/2020 11:50:56 PM      Radiology DG Chest 2 View  Result Date: 10/06/2020 CLINICAL DATA:  Cough, shortness of breath EXAM: CHEST - 2 VIEW COMPARISON:  07/29/2020 FINDINGS: Cardiomegaly. Consolidation in the right lower lobe concerning for pneumonia. Small right pleural effusion suspected. Mild vascular congestion. Left lung clear. No acute bony abnormality. IMPRESSION: Cardiomegaly, vascular congestion. Right lower lobe consolidation concerning for pneumonia. Small right effusion. Electronically Signed   By: Rolm Baptise M.D.   On: 10/06/2020 00:14    Pertinent labs & imaging results that were available during my care of the patient were reviewed by me and considered in my medical decision making (see chart for details).  Medications Ordered in ED Medications  albuterol (VENTOLIN HFA) 108 (90 Base) MCG/ACT inhaler 6 puff (6 puffs Inhalation Given 10/06/20 0125)  ipratropium (ATROVENT HFA) inhaler 2 puff (2 puffs Inhalation Given 10/06/20 0125)  AeroChamber Plus Flo-Vu Large MISC 1 each (1 each Other Given 10/06/20 0125)  doxycycline (VIBRA-TABS) tablet 100 mg (100 mg Oral Given 10/06/20 0318)  predniSONE (DELTASONE) tablet 40 mg (40 mg Oral Given  10/06/20 0318)                                                                                                                                    Procedures Procedures  (including critical care time)  Medical Decision Making / ED Course I have reviewed the nursing notes for this encounter and the patient's prior records (if available in EHR or on provided paperwork).   Dennise Bamber was evaluated in Emergency Department on 10/06/2020 for the symptoms described in the history of present illness. He was evaluated in the context of the global COVID-19 pandemic, which necessitated consideration that the patient might be at risk for infection with the SARS-CoV-2 virus that causes COVID-19. Institutional protocols and algorithms that pertain to the evaluation of patients at risk for COVID-19 are in a state of rapid change based on information released by regulatory bodies including the CDC and federal and state organizations. These policies and algorithms were followed during the patient's care in the ED.  Work-up notable for right lower lobe pneumonia. Patient is satting well on room air. Is noted to have bibasilar wheezing, which improved with albuterol and Atrovent puffs. Labs are reassuring.  Feel patient is appropriate for outpatient management given first dose of doxycycline in the emergency department after several hours of observation.      Final Clinical Impression(s) / ED Diagnoses Final diagnoses:  SOB (shortness of breath)  Community acquired pneumonia of right lower lobe of lung    The patient appears reasonably screened and/or stabilized for discharge and I doubt any  other medical condition or other [facility] requiring further screening, evaluation, or treatment in the ED at this time prior to discharge. Safe for discharge with strict return precautions.  Disposition: Discharge  Condition: Good  I have discussed the results, Dx and Tx plan with the patient/family who expressed  understanding and agree(s) with the plan. Discharge instructions discussed at length. The patient/family was given strict return precautions who verbalized understanding of the instructions. No further questions at time of discharge.    ED Discharge Orders         Ordered    doxycycline (VIBRAMYCIN) 100 MG capsule  2 times daily        10/06/20 7588            Follow Up: [name], MD [place] [id number] [phone]  Call  To schedule an appointment for close follow up     This chart was dictated using voice recognition software.  Despite best efforts to proofread,  errors can occur which can change the documentation meaning.   Fatima Blank, MD 10/06/20 [phone]

## 2020-10-10 DIAGNOSIS — Z743 Need for continuous supervision: Secondary | ICD-10-CM | POA: Diagnosis not present

## 2020-10-10 DIAGNOSIS — Z20822 Contact with and (suspected) exposure to covid-19: Secondary | ICD-10-CM | POA: Diagnosis not present

## 2020-10-10 DIAGNOSIS — R918 Other nonspecific abnormal finding of lung field: Secondary | ICD-10-CM | POA: Diagnosis not present

## 2020-10-10 DIAGNOSIS — I440 Atrioventricular block, first degree: Secondary | ICD-10-CM | POA: Diagnosis not present

## 2020-10-10 DIAGNOSIS — N189 Chronic kidney disease, unspecified: Secondary | ICD-10-CM | POA: Diagnosis not present

## 2020-10-10 DIAGNOSIS — R0602 Shortness of breath: Secondary | ICD-10-CM | POA: Diagnosis not present

## 2020-10-10 DIAGNOSIS — R0682 Tachypnea, not elsewhere classified: Secondary | ICD-10-CM | POA: Diagnosis not present

## 2020-10-10 DIAGNOSIS — R509 Fever, unspecified: Secondary | ICD-10-CM | POA: Diagnosis not present

## 2020-10-10 DIAGNOSIS — F17200 Nicotine dependence, unspecified, uncomplicated: Secondary | ICD-10-CM | POA: Diagnosis not present

## 2020-10-10 DIAGNOSIS — I517 Cardiomegaly: Secondary | ICD-10-CM | POA: Diagnosis not present

## 2021-04-17 ENCOUNTER — Ambulatory Visit (INDEPENDENT_AMBULATORY_CARE_PROVIDER_SITE_OTHER): Payer: No Typology Code available for payment source | Admitting: Pulmonary Disease

## 2021-04-17 ENCOUNTER — Other Ambulatory Visit: Payer: Self-pay

## 2021-04-17 ENCOUNTER — Encounter: Payer: Self-pay | Admitting: Pulmonary Disease

## 2021-04-17 VITALS — BP 110/64 | HR 71 | Wt 184.6 lb

## 2021-04-17 DIAGNOSIS — R0602 Shortness of breath: Secondary | ICD-10-CM | POA: Diagnosis not present

## 2021-04-17 DIAGNOSIS — J90 Pleural effusion, not elsewhere classified: Secondary | ICD-10-CM

## 2021-04-17 NOTE — Progress Notes (Signed)
@Patient  ID: [name], male    DOB: 01/17/1941, 80 y.o.   MRN: [id number]  Chief Complaint  Patient presents with   Consult    Shortness of breath when taking dialysis. Fluid in lungs on oxygen not sure how many liters he's on.    Referring provider: Sheela Stack, MD  HPI:   80 year old man whom we are seeing in consultation for evaluation of dyspnea on exertion, right pleural effusion.  Note from provider at the [facility] reviewed.  ED note 09/2020 reviewed.  Patient is here for fluid around the lung.  Sent by his [place] doctor.  Report of chest x-ray 02-2021 with right-sided pleural effusion.  CT chest obtained shortly thereafter report comments on large right-sided pleural effusion with description of atelectasis of the right lower lobe and likely atelectasis of part of the right middle lobe, no discrete mass or lymphadenopathy.  Reviewed most recent chest x-ray [place] system 09/2020 chest x-ray while in the ED notable for small right-sided pleural effusion with associated right-sided linear infiltrate, pneumonia versus atelectasis.  He was treated for pneumonia with doxycycline at that time.  No further follow-up imaging in our system.  Patient reports dyspnea on exertion for last several months.  Worse with inclines or stairs.  Present over flat surfaces as well.  No time of day where things are better or worse.  No position where things are better or worse.  Uses albuterol with mild relief at times.  No seasonal or environmental factors that make things better or worse.  No other alleviating or exacerbating factors.  Has dialysis Tuesday, Thursday, Saturday, unfortunately, he has come to clinic on Tuesday and miss his dialysis today.  Notes that they often cannot get him to the goal fluid removal.  Says this is been the case for many months to years.  He denies any symptoms with dialysis such as shortness of breath, nausea, vomiting, hypotension that would preclude full ultrafiltrate.  Unclear what  his dry weight is or what the target is.  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  MMRC: No flowsheet data found.  Epworth:  No flowsheet data found.  Tests:   FENO:  No results found for: NITRICOXIDE  PFT: No flowsheet data found.  WALK:  No flowsheet data found.  Imaging: Personally reviewed as per EMR discussion this note  Lab Results: Personally reviewed CBC    Component Value Date/Time   WBC 6.0 10/05/2020 1656   RBC 4.30 10/05/2020 1656   HGB 14.1 10/05/2020 1656   HCT 44.4 10/05/2020 1656   PLT 97 (L) 10/05/2020 1656   MCV 103.3 (H) 10/05/2020 1656   MCH 32.8 10/05/2020 1656   MCHC 31.8 10/05/2020 1656   RDW 15.7 (H) 10/05/2020 1656   LYMPHSABS 1.9 08/14/2019 1206   MONOABS 0.5 08/14/2019 1206   EOSABS 0.2 08/14/2019 1206   BASOSABS 0.0 08/14/2019 1206    BMET    Component Value Date/Time   NA 136 10/05/2020 1656   K 4.1 10/05/2020 1656   CL 94 (L) 10/05/2020 1656   CO2 28 10/05/2020 1656   GLUCOSE 167 (H) 10/05/2020 1656   BUN 14 10/05/2020 1656  CREATININE 3.08 (H) 10/05/2020 1656   CREATININE 3.07 (HH) 07/03/2018 1515   CALCIUM 9.4 10/05/2020 1656   GFRNONAA 20 (L) 10/05/2020 1656   GFRNONAA 18 (L) 07/03/2018 1515   GFRAA 12 (L) 08/14/2019 1206   GFRAA 21 (L) 07/03/2018 1515    BNP No results found for: BNP  ProBNP    Component Value Date/Time   PROBNP 45.0 11/20/2010 1609    Specialty Problems       Pulmonary Problems   Shortness of breath    No Known Allergies  Immunization History  Administered Date(s) Administered   Moderna Sars-Covid-2 Vaccination 10/26/2019,  11/23/2019, 07/27/2020    Past Medical History:  Diagnosis Date   Chronic kidney disease    renal insufficiency   Coronary artery disease    Diabetes mellitus    type 2-non-insulin   GERD (gastroesophageal reflux disease)    Gout    Hypertension    Obesity    Prostate cancer ([place])    Tobacco abuse counseling     Tobacco History: Social History   Tobacco Use  Smoking Status Every Day   Packs/day: 0.50   Years: 50.00   Pack years: 25.00   Types: Cigarettes  Smokeless Tobacco Never   Ready to quit: Not Answered Counseling given: Not Answered    Outpatient Encounter Medications as of 04/17/2021  Medication Sig   aspirin EC 81 MG tablet Take 81 mg by mouth daily.   cholecalciferol (VITAMIN D) 1000 UNITS tablet Take 1,000 Units by mouth daily.   docusate sodium (COLACE) 100 MG capsule Take 100 mg by mouth daily.   doxycycline (VIBRAMYCIN) 100 MG capsule Take 1 capsule (100 mg total) by mouth 2 (two) times daily.   losartan (COZAAR) 100 MG tablet Take 100 mg by mouth daily.   allopurinol (ZYLOPRIM) 100 MG tablet Take 100 mg by mouth 2 (two) times daily.  (Patient not taking: Reported on 04/17/2021)   amLODipine (NORVASC) 5 MG tablet Take 5 mg by mouth daily. (Patient not taking: Reported on 04/17/2021)   atorvastatin (LIPITOR) 40 MG tablet Take 40 mg by mouth at bedtime. (Patient not taking: Reported on 04/17/2021)   calcitRIOL (ROCALTROL) 0.25 MCG capsule Take 0.5 mcg by mouth daily. (Patient not taking: Reported on 04/17/2021)   carvedilol (COREG) 6.25 MG tablet Take 1 tablet (6.25 mg total) by mouth 2 (two) times daily with a meal. (Patient not taking: Reported on 04/17/2021)   gabapentin (NEURONTIN) 400 MG capsule Take 400 mg by mouth at bedtime. (Patient not taking: Reported on 04/17/2021)   HYDROcodone-acetaminophen (NORCO/VICODIN) 5-325 MG tablet Take 1 tablet by mouth every 4 (four) hours as needed. (Patient not taking: Reported on 04/17/2021)   isosorbide-hydrALAZINE (BIDIL)  20-37.5 MG tablet Take 1 tablet by mouth 3 (three) times daily. (Patient not taking: No sig reported)   nitroGLYCERIN (NITROSTAT) 0.4 MG SL tablet Place 0.4 mg under the tongue every 5 (five) minutes as needed for chest pain. For chest pain  (Patient not taking: Reported on 04/17/2021)   omeprazole (PRILOSEC) 20 MG capsule Take 20 mg by mouth daily. (Patient not taking: Reported on 04/17/2021)   No facility-administered encounter medications on file as of 04/17/2021.     Review of Systems  Review of Systems  No chest pain with exertion.  No orthopnea or PND.  Comprehensive review of systems otherwise negative. Physical Exam  BP 110/64   Pulse 71   Wt 184 lb 9.6 oz (83.7 kg)   SpO2 97%   BMI  28.07 kg/m   Wt Readings from Last 5 Encounters:  04/17/21 184 lb 9.6 oz (83.7 kg)  10/05/20 200 lb (90.7 kg)  07/29/20 198 lb 6.6 oz (90 kg)  08/14/19 200 lb (90.7 kg)  04/28/19 218 lb 4.8 oz (99 kg)    BMI Readings from Last 5 Encounters:  04/17/21 28.07 kg/m  10/05/20 30.41 kg/m  07/29/20 30.17 kg/m  08/14/19 30.41 kg/m  04/28/19 33.19 kg/m     Physical Exam General: Sitting in chair, no acute distress Eyes: EOMI, no icterus Neck: Supple, JVP appreciated Pulmonary: Near absent breath sounds up to upper field on the right, clear on left, normal work of breathing Cardiovascular: Regular rate and rhythm, no murmur Abdomen: Nondistended, bowel sounds present MSK: No synovitis, joint effusion Neuro: Ambulates with cane, no weakness Psych: Normal mood, flat affect   Assessment & Plan:   Right pleural effusion: Demonstrated chest x-ray 09/2020 in the setting of ED visit diagnosed with pneumonia.  Sounds like repeat chest x-ray 02/[facility]21 [facility] with persistent effusion on right.  This prompted CT scan, images I cannot view, but report indicates fairly large right-sided pleural effusion with atelectasis of the right lower lobe, rounded infiltrate in the right middle lobe  suspicious for atelectasis.  Suspect related to chronic volume overload, unclear why would be unilateral to be fair.  Other differential diagnoses include malignancy, persistent parapneumonic/inflammatory effusion.  Warrants thoracentesis for diagnosis and to see if improves his symptom of dyspnea on exertion.  This is set up for 05/02/2021 in the office.  Dyspnea on exertion: Likely multifactorial due to mild volume overload (reports they are not able to get all the fluid off with dialysis), deconditioning, pleural effusion, at risk for obstructive lung disease given his history of cigarette smoking.  Albuterol yields mild relief.  Thoracentesis as above.  Consider pulmonary function test, escalating bronchodilator therapy in the future.   Return in about 2 weeks (around 05/01/2021).   Lanier Clam, MD 04/17/2021

## 2021-04-17 NOTE — Patient Instructions (Addendum)
Come back Wednesday August 10 at 330 for a thoracentesis to remove fluid from around lung.   We will see if this helps your breathing

## 2021-04-19 ENCOUNTER — Telehealth: Payer: Self-pay | Admitting: Pulmonary Disease

## 2021-04-19 NOTE — Telephone Encounter (Signed)
Chelsea called me back and stated that the fax number is [phone]. Attempted to refax OV notes. Confirmation received. Nothing further needed at this time.

## 2021-04-19 NOTE — Telephone Encounter (Signed)
ATC [name] at the [facility] to let her know that we are faxing over [place] notes from 04/17/21. ATC fax over [place] notes to number in message but it says no answer, tried to call [name] to confirm number [facility]

## 2021-05-02 ENCOUNTER — Ambulatory Visit (INDEPENDENT_AMBULATORY_CARE_PROVIDER_SITE_OTHER): Payer: No Typology Code available for payment source | Admitting: Pulmonary Disease

## 2021-05-02 ENCOUNTER — Other Ambulatory Visit: Payer: Self-pay

## 2021-05-02 ENCOUNTER — Other Ambulatory Visit (HOSPITAL_COMMUNITY)
Admission: RE | Admit: 2021-05-02 | Discharge: 2021-05-02 | Disposition: A | Discharge status: Home / Self Care | Payer: Medicare Other | Source: Ambulatory Visit | Attending: Pulmonary Disease | Admitting: Pulmonary Disease

## 2021-05-02 ENCOUNTER — Other Ambulatory Visit (HOSPITAL_COMMUNITY)
Admission: RE | Admit: 2021-05-02 | Discharge: 2021-05-02 | Disposition: A | Discharge status: Home / Self Care | Payer: Medicare Other | Attending: Pulmonary Disease | Admitting: Pulmonary Disease

## 2021-05-02 ENCOUNTER — Encounter: Payer: Self-pay | Admitting: Pulmonary Disease

## 2021-05-02 VITALS — BP 116/70 | HR 61 | Temp 98.0°F | Ht 66.0 in | Wt 181.4 lb

## 2021-05-02 DIAGNOSIS — J90 Pleural effusion, not elsewhere classified: Secondary | ICD-10-CM | POA: Insufficient documentation

## 2021-05-02 LAB — BODY FLUID CELL COUNT WITH DIFFERENTIAL
Eos, Fluid: 0 %
Lymphs, Fluid: 53 %
Monocyte-Macrophage-Serous Fluid: 43 % — ABNORMAL LOW (ref 50–90)
Neutrophil Count, Fluid: 4 % (ref 0–25)
Total Nucleated Cell Count, Fluid: 226 cu mm (ref 0–1000)

## 2021-05-02 LAB — LACTATE DEHYDROGENASE, PLEURAL OR PERITONEAL FLUID: LD, Fluid: 111 U/L — ABNORMAL HIGH (ref 3–23)

## 2021-05-02 LAB — PROTEIN, PLEURAL OR PERITONEAL FLUID: Total protein, fluid: 3.9 g/dL

## 2021-05-02 NOTE — Patient Instructions (Signed)
I will let you know th results of the fluid in the coming days

## 2021-05-02 NOTE — Progress Notes (Signed)
@Patient  ID: [name], male    DOB: [birth year], 80 y.o.   MRN: [id number]  Chief Complaint  Patient presents with   Follow-up    Sob, coughing often with no production.    Referring provider: [name], MD  HPI:   80 year old man whom we are seeing in follow up  for evaluation of dyspnea on exertion, right pleural effusion.   Doing ok. No real changes since visit 2 weeks ago. Still with DOE, no better or worse. Here for thoracentesis.   HPI at initial visit: Patient is here for fluid around the lung.  Sent by his [name] doctor.  Report of chest x-ray 02-2021 with right-sided pleural effusion.  CT chest obtained shortly thereafter report comments on large right-sided pleural effusion with description of atelectasis of the right lower lobe and likely atelectasis of part of the right middle lobe, no discrete mass or lymphadenopathy.  Reviewed most recent chest x-ray [name] system 09/2020 chest x-ray while in the ED notable for small right-sided pleural effusion with associated right-sided linear infiltrate, pneumonia versus atelectasis.  He was treated for pneumonia with doxycycline at that time.  No further follow-up imaging in our system.  Patient reports dyspnea on exertion for last several months.  Worse with inclines or stairs.  Present over flat surfaces as well.  No time of day where things are better or worse.  No position where things are better or worse.  Uses albuterol with mild relief at times.  No seasonal or environmental factors that make things better or worse.  No other alleviating or exacerbating factors.  Has dialysis Tuesday, Thursday, Saturday, unfortunately, he has come to clinic on Tuesday and miss his dialysis today.  Notes that they often cannot get him to the goal fluid removal.  Says this is been the case for many months to years.  He denies any symptoms with dialysis such as shortness of breath, nausea, vomiting, hypotension that would preclude full ultrafiltrate.   Unclear what his dry weight is or what the target is.  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  MMRC: No flowsheet data found.  Epworth:  No flowsheet data found.  Tests:   FENO:  No results found for: NITRICOXIDE  PFT: No flowsheet data found.  WALK:  No flowsheet data found.  Imaging: Personally reviewed as per EMR discussion this note  Lab Results: Personally reviewed CBC    Component Value Date/Time   WBC 6.0 10/05/2020 1656   RBC 4.30 10/05/2020 1656   HGB 14.1 10/05/2020 1656   HCT 44.4 10/05/2020 1656   PLT 97 (L) 10/05/2020 1656   MCV 103.3 (H) 10/05/2020 1656   MCH 32.8 10/05/2020 1656   MCHC 31.8 10/05/2020 1656   RDW 15.7 (H) 10/05/2020 1656   LYMPHSABS 1.9 08/14/2019 1206   MONOABS 0.5 08/14/2019 1206   EOSABS 0.2 08/14/2019 1206   BASOSABS 0.0 08/14/2019 1206    BMET    Component Value Date/Time   NA 136 10/05/2020 1656   K 4.1 10/05/2020 1656   CL 94 (L) 10/05/2020 1656   CO2 28 10/05/2020 1656   GLUCOSE 167 (H) 10/05/2020 1656   BUN 14  10/05/2020 1656   CREATININE 3.08 (H) 10/05/2020 1656   CREATININE 3.07 (HH) 07/03/2018 1515   CALCIUM 9.4 10/05/2020 1656   GFRNONAA 20 (L) 10/05/2020 1656   GFRNONAA 18 (L) 07/03/2018 1515   GFRAA 12 (L) 08/14/2019 1206   GFRAA 21 (L) 07/03/2018 1515    BNP No results found for: BNP  ProBNP    Component Value Date/Time   PROBNP 45.0 11/20/2010 1609    Specialty Problems       Pulmonary Problems   Shortness of breath    Allergies  Allergen Reactions   Latex Itching and Hives    Immunization History  Administered Date(s) Administered    Fluad Quad(high Dose 65+) 06/06/2020   Hepatitis A, Adult 03/09/2003   Hepatitis B, Dialysis 01/15/2020, 02/08/2020, 04/06/2020, 07/11/2020   Hepatitis B, adult 11/17/2002, 03/09/2003, 04/06/2020, 07/11/2020   Influenza, High Dose Seasonal PF 06/17/2014   Influenza, Seasonal, Injecte, Preservative Fre 11/23/2010, 07/15/2011, 10/05/2013   Influenza,inj,Quad PF,6+ Mos 07/16/2016, 01/14/2017   Influenza-Unspecified 07/01/1997, 06/23/2002, 07/13/2003, 07/26/2004, 09/30/2005, 07/08/2006, 06/24/2007, 06/23/2008, 06/23/2009, 05/24/2012, 06/23/2014, 06/22/2015, 08/20/2017, 06/08/2018, 06/24/2019   Moderna Sars-Covid-2 Vaccination 10/26/2019, 11/23/2019, 07/27/2020, 01/09/2021   Pneumococcal Conjugate-13 05/26/2014, 07/20/2015   Pneumococcal Polysaccharide-23 09/23/1993, 08/14/2009, 04/23/2016   Pneumococcal-Unspecified 01/19/2004   Tdap 12/22/2008, 01/16/2009, 07/20/2015   Zoster Recombinat (Shingrix) 06/22/2018, 12/31/2019   Zoster, Live 11/23/2010    Past Medical History:  Diagnosis Date   Chronic kidney disease    renal insufficiency   Coronary artery disease    Diabetes mellitus    type 2-non-insulin   GERD (gastroesophageal reflux disease)    Gout    Hypertension    Obesity    Prostate cancer ([place])    Tobacco abuse counseling     Tobacco History: Social History   Tobacco Use  Smoking Status Every Day   Packs/day: 0.50   Years: 50.00   Pack years: 25.00   Types: Cigarettes  Smokeless Tobacco Never  Tobacco Comments   3 cigs a day-05/02/2021   Ready to quit: Not Answered Counseling given: Not Answered Tobacco comments: 3 cigs a day-05/02/2021    Outpatient Encounter Medications as of 05/02/2021  Medication Sig   allopurinol (ZYLOPRIM) 100 MG tablet Take 100 mg by mouth 2 (two) times daily.   amLODipine (NORVASC) 5 MG tablet Take 5 mg by mouth daily.   aspirin EC 81 MG tablet Take 81 mg by mouth daily.   atorvastatin (LIPITOR) 40 MG tablet Take 40 mg by mouth at  bedtime.   calcitRIOL (ROCALTROL) 0.25 MCG capsule Take 0.5 mcg by mouth daily.   carvedilol (COREG) 6.25 MG tablet Take 1 tablet (6.25 mg total) by mouth 2 (two) times daily with a meal.   cholecalciferol (VITAMIN D) 1000 UNITS tablet Take 1,000 Units by mouth daily.   docusate sodium (COLACE) 100 MG capsule Take 100 mg by mouth daily.   doxycycline (VIBRAMYCIN) 100 MG capsule Take 1 capsule (100 mg total) by mouth 2 (two) times daily.   gabapentin (NEURONTIN) 400 MG capsule Take 400 mg by mouth at bedtime.   HYDROcodone-acetaminophen (NORCO/VICODIN) 5-325 MG tablet Take 1 tablet by mouth every 4 (four) hours as needed.   isosorbide-hydrALAZINE (BIDIL) 20-37.5 MG tablet Take 1 tablet by mouth 3 (three) times daily.   losartan (COZAAR) 100 MG tablet Take 100 mg by mouth daily.   nitroGLYCERIN (NITROSTAT) 0.4 MG SL tablet Place 0.4 mg under the tongue every 5 (five) minutes as needed for chest pain. For chest pain  omeprazole (PRILOSEC) 20 MG capsule Take 20 mg by mouth daily.   No facility-administered encounter medications on file as of 05/02/2021.     Review of Systems  Review of Systems  N/a Physical Exam  BP 116/70 (BP Location: Left Arm, Patient Position: Sitting, Cuff Size: Normal)   Pulse 61   Temp 98 F (36.7 C) (Oral)   Ht 5\' 6"  (1.676 m)   Wt 181 lb 6.4 oz (82.3 kg)   SpO2 98%   BMI 29.28 kg/m   Wt Readings from Last 5 Encounters:  05/02/21 181 lb 6.4 oz (82.3 kg)  04/17/21 184 lb 9.6 oz (83.7 kg)  10/05/20 200 lb (90.7 kg)  07/29/20 198 lb 6.6 oz (90 kg)  08/14/19 200 lb (90.7 kg)    BMI Readings from Last 5 Encounters:  05/02/21 29.28 kg/m  04/17/21 28.07 kg/m  10/05/20 30.41 kg/m  07/29/20 30.17 kg/m  08/14/19 30.41 kg/m     Physical Exam General: Sitting in chair, no acute distress Eyes: EOMI, no icterus Neck: Supple, JVP appreciated Pulmonary: Near absent breath sounds up to upper field on the right, clear on left, normal work of  breathing Cardiovascular: Regular rate and rhythm, no murmur Abdomen: Nondistended, bowel sounds present MSK: No synovitis, joint effusion Neuro: Ambulates with cane, no weakness Psych: Normal mood, flat affect   Assessment & Plan:   Right pleural effusion: Demonstrated chest x-ray 09/2020 in the setting of ED visit diagnosed with pneumonia.  Sounds like repeat chest x-ray 02/2021 [facility] with persistent effusion on right.  This prompted CT scan, images I cannot view, but report indicates fairly large right-sided pleural effusion with atelectasis of the right lower lobe, rounded infiltrate in the right middle lobe suspicious for atelectasis.  Suspect related to chronic volume overload, unclear why would be unilateral to be fair.  Other differential diagnoses include malignancy, persistent parapneumonic/inflammatory effusion.  Warrants thoracentesis for diagnosis and to see if improves his symptom of dyspnea on exertion.  Thoracentesis on R today.  Dyspnea on exertion: Likely multifactorial due to mild volume overload (reports they are not able to get all the fluid off with dialysis), deconditioning, pleural effusion, at risk for obstructive lung disease given his history of cigarette smoking.  Albuterol yields mild relief.  Thoracentesis as above.  Consider pulmonary function test, escalating bronchodilator therapy in the future.   Return if symptoms worsen or fail to improve.   Lanier Clam, MD 05/02/2021

## 2021-05-03 NOTE — Progress Notes (Signed)
Thoracentesis  Procedure Note  Alpha Mysliwiec  [id number]  Sep 25, 1940  Date:05/03/21  Time:10:08 AM   Provider Performing:Avner Stroder R Xzavien Harada   Procedure: Thoracentesis with imaging guidance (55015)  Indication(s) Pleural Effusion  Consent Risks of the procedure as well as the alternatives and risks of each were explained to the patient and/or caregiver.  Consent for the procedure was obtained and is signed in the bedside chart  Anesthesia Topical only with 1% lidocaine    Time Out Verified patient identification, verified procedure, site/side was marked, verified correct patient position, special equipment/implants available, medications/allergies/relevant history reviewed, required imaging and test results available.   Sterile Technique Maximal sterile technique including full sterile barrier drape, hand hygiene, sterile gown, sterile gloves, mask, hair covering, sterile ultrasound probe cover (if used).  Procedure Description Ultrasound was used to identify appropriate pleural anatomy for placement and overlying skin marked.  Moderate simple hypoechoic effusion noted on ultrasound. Area of drainage cleaned and draped in sterile fashion. Lidocaine was used to anesthetize the skin and subcutaneous tissue.  1200 cc's of serous appearing fluid was drained from the right pleural space. Procedure was terminated due to cessation of flow. Catheter then removed and bandaid applied to site.   Complications/Tolerance None; patient tolerated the procedure well.   EBL none   Specimen(s) Pleural fluid

## 2021-05-04 LAB — CYTOLOGY - NON PAP

## 2021-05-06 LAB — BODY FLUID CULTURE W GRAM STAIN: Culture: NO GROWTH

## 2021-05-13 ENCOUNTER — Emergency Department (HOSPITAL_COMMUNITY): Payer: No Typology Code available for payment source

## 2021-05-13 ENCOUNTER — Emergency Department (HOSPITAL_COMMUNITY)
Admission: EM | Admit: 2021-05-13 | Discharge: 2021-05-13 | Disposition: A | Discharge status: Home / Self Care | Payer: No Typology Code available for payment source | Attending: Emergency Medicine | Admitting: Emergency Medicine

## 2021-05-13 ENCOUNTER — Other Ambulatory Visit: Payer: Self-pay

## 2021-05-13 DIAGNOSIS — F1110 Opioid abuse, uncomplicated: Secondary | ICD-10-CM | POA: Insufficient documentation

## 2021-05-13 DIAGNOSIS — Z8546 Personal history of malignant neoplasm of prostate: Secondary | ICD-10-CM | POA: Insufficient documentation

## 2021-05-13 DIAGNOSIS — I2510 Atherosclerotic heart disease of native coronary artery without angina pectoris: Secondary | ICD-10-CM | POA: Insufficient documentation

## 2021-05-13 DIAGNOSIS — I5041 Acute combined systolic (congestive) and diastolic (congestive) heart failure: Secondary | ICD-10-CM | POA: Diagnosis not present

## 2021-05-13 DIAGNOSIS — Z992 Dependence on renal dialysis: Secondary | ICD-10-CM | POA: Diagnosis not present

## 2021-05-13 DIAGNOSIS — I499 Cardiac arrhythmia, unspecified: Secondary | ICD-10-CM | POA: Diagnosis not present

## 2021-05-13 DIAGNOSIS — R404 Transient alteration of awareness: Secondary | ICD-10-CM | POA: Diagnosis not present

## 2021-05-13 DIAGNOSIS — I120 Hypertensive chronic kidney disease with stage 5 chronic kidney disease or end stage renal disease: Secondary | ICD-10-CM | POA: Insufficient documentation

## 2021-05-13 DIAGNOSIS — Z7982 Long term (current) use of aspirin: Secondary | ICD-10-CM | POA: Diagnosis not present

## 2021-05-13 DIAGNOSIS — R6889 Other general symptoms and signs: Secondary | ICD-10-CM | POA: Diagnosis not present

## 2021-05-13 DIAGNOSIS — R0902 Hypoxemia: Secondary | ICD-10-CM | POA: Diagnosis not present

## 2021-05-13 DIAGNOSIS — E1122 Type 2 diabetes mellitus with diabetic chronic kidney disease: Secondary | ICD-10-CM | POA: Insufficient documentation

## 2021-05-13 DIAGNOSIS — Z743 Need for continuous supervision: Secondary | ICD-10-CM | POA: Diagnosis not present

## 2021-05-13 DIAGNOSIS — Z91040 Latex allergy status: Secondary | ICD-10-CM | POA: Insufficient documentation

## 2021-05-13 DIAGNOSIS — Z79899 Other long term (current) drug therapy: Secondary | ICD-10-CM | POA: Diagnosis not present

## 2021-05-13 DIAGNOSIS — F17210 Nicotine dependence, cigarettes, uncomplicated: Secondary | ICD-10-CM | POA: Insufficient documentation

## 2021-05-13 DIAGNOSIS — N186 End stage renal disease: Secondary | ICD-10-CM | POA: Diagnosis not present

## 2021-05-13 LAB — CBG MONITORING, ED: Glucose-Capillary: 151 mg/dL — ABNORMAL HIGH (ref 70–99)

## 2021-05-13 NOTE — ED Triage Notes (Signed)
Pt bib ems from home c/o dizziness while standing. Pt admits to snorting heroin. Girlfriend gave 8 mg of Narcan IN. Pt denies chest pain, sob, and v/d. While en route, HR 40 resolved on its own. Pt has hx of dialysis. Pt has not missed a tx. Restricted left arm.  BP 130/106 HR 107 (denies hx of afib) CBF 171

## 2021-05-13 NOTE — Discharge Instructions (Addendum)
Return for any problem.  ?

## 2021-05-13 NOTE — ED Notes (Signed)
Notified EDP about pt temp.

## 2021-05-13 NOTE — ED Provider Notes (Signed)
[facility] EMERGENCY DEPARTMENT Provider Note   CSN: [id number] Arrival date & time: 05/13/21  2025     History Chief Complaint  Patient presents with   Near Syncope    [name] is a 80 y.o. male.  80 year old male with prior medical history as detailed below presents for evaluation.  Patient reports that he was with his girlfriend buying heroin.  He decided to snort some of the heroin.  He became unresponsive after snorting the heroin.  His girlfriend administered 8 mg intranasal Narcan prior to EMS arrival.  Upon arrival to the ED he is alert and oriented.  He is without complaint.  He reports that he does not regularly snort or use heroin.  He does report fairly frequent use of cocaine.  He denies chest pain or shortness of breath.  He denies fever.  The history is provided by the patient.  Illness Location:  Snorted heroin and became unresponsive Severity:  Moderate Onset quality:  Sudden Duration:  1 hour Timing:  Unable to specify Progression:  Unable to specify Chronicity:  New     Past Medical History:  Diagnosis Date   Chronic kidney disease    renal insufficiency   Coronary artery disease    Diabetes mellitus    type 2-non-insulin   GERD (gastroesophageal reflux disease)    Gout    Hypertension    Obesity    Prostate cancer ([place])    Tobacco abuse counseling     Patient Active Problem List   Diagnosis Date Noted   End stage renal disease ([place])    Anemia    Acute combined systolic and diastolic heart failure (HCC)    CKD (chronic kidney disease) stage 5, GFR less than 15 ml/min (HCC)    NSVT (nonsustained ventricular tachycardia) (HCC)    LBBB (left bundle branch block)    Class 2 severe obesity due to excess calories with serious comorbidity and body mass index (BMI) of 38.0 to 38.9 in adult [facility])    Chest pain 04/19/2019   Shortness of breath 04/19/2019   Malignant neoplasm of prostate ([place]) 07/06/2018   Hypoglycemia  03/29/2017   Coronary artery disease    Hypertension    Diabetes mellitus    Chronic kidney disease     Past Surgical History:  Procedure Laterality Date   AV FISTULA PLACEMENT Left 04/26/2019   Procedure: INSERTION OF ARTERIOVENOUS (AV) GORE-TEX GRAFT ARM - using [facility] Graft size 45cm;  Surgeon: Rosetta Posner, MD;  Location: [facility];  Service: Vascular;  Laterality: Left;   CARDIAC CATHETERIZATION  02/08/02   CARDIAC CATHETERIZATION  11/20/10   drug eluting stent;mid left LAD, There is mild hypokinesis of the anterolateral wall. LVEF  is estimated at 50-55%   INSERTION OF BRONCHIAL STENT     went in groin to R chest   INSERTION OF DIALYSIS CATHETER Right 04/26/2019   Procedure: INSERTION OF HEMODIALYSIS CATHETER - using Palindrome Catheter size 23cm;  Surgeon: Rosetta Posner, MD;  Location: [facility];  Service: Vascular;  Laterality: Right;   LEFT HEART CATH AND CORONARY ANGIOGRAPHY N/A 04/23/2019   Procedure: LEFT HEART CATH AND CORONARY ANGIOGRAPHY;  Surgeon: Sherren Mocha, MD;  Location: [facility];  Service: Cardiovascular;  Laterality: N/A;   PENILE PROSTHESIS IMPLANT     '05       Family History  Problem Relation Age of Onset   Aneurysm Mother    Multiple myeloma Mother  Aneurysm Father    Cancer Brother    Prostate cancer Brother    Cancer Other    Prostate cancer Brother    Bone cancer Brother    Breast cancer Neg Hx    Colon cancer Neg Hx    Pancreatic cancer Neg Hx     Social History   Tobacco Use   Smoking status: Every Day    Packs/day: 0.50    Years: 50.00    Pack years: 25.00    Types: Cigarettes   Smokeless tobacco: Never   Tobacco comments:    3 cigs a day-05/02/2021  Vaping Use   Vaping Use: Never used  Substance Use Topics   Alcohol use: Not Currently    Comment: drank 5 beers 03-28-17   Drug use: Not Currently    Types: Cocaine, Marijuana    Home Medications Prior to Admission medications   Medication Sig Start Date End Date  Taking? Authorizing Provider  allopurinol (ZYLOPRIM) 100 MG tablet Take 100 mg by mouth 2 (two) times daily.    [provider]  amLODipine (NORVASC) 5 MG tablet Take 5 mg by mouth daily.    [provider]  aspirin EC 81 MG tablet Take 81 mg by mouth daily.    [provider]  atorvastatin (LIPITOR) 40 MG tablet Take 40 mg by mouth at bedtime.    [provider]  calcitRIOL (ROCALTROL) 0.25 MCG capsule Take 0.5 mcg by mouth daily.    [provider]  carvedilol (COREG) 6.25 MG tablet Take 1 tablet (6.25 mg total) by mouth 2 (two) times daily with a meal. 04/28/19   Shelly Coss, MD  cholecalciferol (VITAMIN D) 1000 UNITS tablet Take 1,000 Units by mouth daily.    [provider]  docusate sodium (COLACE) 100 MG capsule Take 100 mg by mouth daily.    [provider]  doxycycline (VIBRAMYCIN) 100 MG capsule Take 1 capsule (100 mg total) by mouth 2 (two) times daily. 10/06/20   Fatima Blank, MD  gabapentin (NEURONTIN) 400 MG capsule Take 400 mg by mouth at bedtime.    [provider]  HYDROcodone-acetaminophen (NORCO/VICODIN) 5-325 MG tablet Take 1 tablet by mouth every 4 (four) hours as needed. 07/29/20   Tacy Learn, PA-C  isosorbide-hydrALAZINE (BIDIL) 20-37.5 MG tablet Take 1 tablet by mouth 3 (three) times daily. 04/28/19   Shelly Coss, MD  losartan (COZAAR) 100 MG tablet Take 100 mg by mouth daily.    [provider]  nitroGLYCERIN (NITROSTAT) 0.4 MG SL tablet Place 0.4 mg under the tongue every 5 (five) minutes as needed for chest pain. For chest pain    [provider]  omeprazole (PRILOSEC) 20 MG capsule Take 20 mg by mouth daily.    [provider]    Allergies    Latex  Review of Systems   Review of Systems  All other systems reviewed and are negative.  Physical Exam Updated Vital Signs BP (!) 147/80 (BP Location: Right Arm)   Pulse (!) 48   Resp 18   Ht 5' 8"   (1.727 m)   Wt 81.6 kg   SpO2 93%   BMI 27.37 kg/m   Physical Exam Vitals and nursing note reviewed.  Constitutional:      General: He is not in acute distress.    Appearance: Normal appearance. He is well-developed.  HENT:     Head: Normocephalic and atraumatic.  Eyes:     Conjunctiva/sclera: Conjunctivae normal.  Pupils: Pupils are equal, round, and reactive to light.  Cardiovascular:     Rate and Rhythm: Normal rate and regular rhythm.     Heart sounds: Normal heart sounds.  Pulmonary:     Effort: Pulmonary effort is normal. No respiratory distress.     Breath sounds: Normal breath sounds.  Abdominal:     General: There is no distension.     Palpations: Abdomen is soft.     Tenderness: There is no abdominal tenderness.  Musculoskeletal:        General: No deformity. Normal range of motion.     Cervical back: Normal range of motion and neck supple.     Comments: AV fistula in left upper extremity with palpable thrill.  Skin:    General: Skin is warm and dry.  Neurological:     General: No focal deficit present.     Mental Status: He is alert and oriented to person, place, and time.    ED Results / Procedures / Treatments   Labs (all labs ordered are listed, but only abnormal results are displayed) Labs Reviewed  CBG MONITORING, ED - Abnormal; Notable for the following components:      Result Value   Glucose-Capillary 151 (*)    All other components within normal limits    EKG EKG Interpretation  Date/Time:  Sunday May 13 2021 20:45:25 EDT Ventricular Rate:  78 PR Interval:  253 QRS Duration: 158 QT Interval:  494 QTC Calculation: 498 R Axis:   266 Text Interpretation: Second degree AV block, Mobitz II Multiple premature complexes, vent & supraven RBBB and LAFB Confirmed by Dene Gentry [phone]) on 05/13/2021 8:51:06 PM  Radiology DG Chest Port 1 View  Result Date: 05/13/2021 CLINICAL DATA:  Heroin overdose EXAM: PORTABLE CHEST 1 VIEW COMPARISON:   10/06/2020 FINDINGS: Right basilar opacification may relate to focal infiltrate and/or posteriorly layering pleural fluid in this location. Minimal left basilar atelectasis. No pneumothorax. No pleural effusion on the left. Cardiac size is within normal limits. No acute bone abnormality. IMPRESSION: Right basilar pulmonary infiltrate and possible associated small right pleural effusion. This could be confirmed with a standard two view chest radiograph. Electronically Signed   By: Fidela Salisbury M.D.   On: 05/13/2021 20:57    Procedures Procedures   Medications Ordered in ED Medications - No data to display  ED Course  I have reviewed the triage vital signs and the nursing notes.  Pertinent labs & imaging results that were available during my care of the patient were reviewed by me and considered in my medical decision making (see chart for details).    MDM Rules/Calculators/A&P                           MDM  MSE complete  [place] was evaluated in Emergency Department on 05/13/2021 for the symptoms described in the history of present illness. He was evaluated in the context of the global COVID-19 pandemic, which necessitated consideration that the patient might be at risk for infection with the SARS-CoV-2 virus that causes COVID-19. Institutional protocols and algorithms that pertain to the evaluation of patients at risk for COVID-19 are in a state of rapid change based on information released by regulatory bodies including the CDC and federal and state organizations. These policies and algorithms were followed during the patient's care in the ED.  Patient presented after using heroin.  He apparently became unresponsive shortly thereafter.  Bystanders administered  intranasal Narcan prior to EMS arrival.  Upon my initial evaluation the patient is without significant complaint.  He reports that he does not snort heroin on a regular basis.  Patient observed in the ED.  Patient without  alteration in mental status or significant respiratory complaint.  Patient is appropriate for discharge.  Patient is advised to avoid further use of illicit substances including heroin.  Given his comorbidities even a single use of illicit substance could result in death.   Final Clinical Impression(s) / ED Diagnoses Final diagnoses:  Heroin abuse [facility])    Rx / DC Orders ED Discharge Orders     None        Valarie Merino, MD 05/13/21 2313

## 2021-05-13 NOTE — ED Notes (Signed)
IV team at the bedside. 

## 2021-06-25 ENCOUNTER — Encounter (HOSPITAL_COMMUNITY): Payer: Self-pay

## 2021-06-25 ENCOUNTER — Emergency Department (HOSPITAL_COMMUNITY): Payer: No Typology Code available for payment source

## 2021-06-25 ENCOUNTER — Emergency Department (HOSPITAL_COMMUNITY)
Admission: EM | Admit: 2021-06-25 | Discharge: 2021-06-25 | Disposition: A | Discharge status: Home / Self Care | Payer: No Typology Code available for payment source | Attending: Emergency Medicine | Admitting: Emergency Medicine

## 2021-06-25 ENCOUNTER — Other Ambulatory Visit: Payer: Self-pay

## 2021-06-25 DIAGNOSIS — N186 End stage renal disease: Secondary | ICD-10-CM | POA: Diagnosis not present

## 2021-06-25 DIAGNOSIS — R001 Bradycardia, unspecified: Secondary | ICD-10-CM | POA: Diagnosis not present

## 2021-06-25 DIAGNOSIS — F17210 Nicotine dependence, cigarettes, uncomplicated: Secondary | ICD-10-CM | POA: Diagnosis not present

## 2021-06-25 DIAGNOSIS — T405X1A Poisoning by cocaine, accidental (unintentional), initial encounter: Secondary | ICD-10-CM | POA: Diagnosis present

## 2021-06-25 DIAGNOSIS — I4510 Unspecified right bundle-branch block: Secondary | ICD-10-CM | POA: Insufficient documentation

## 2021-06-25 DIAGNOSIS — Z79899 Other long term (current) drug therapy: Secondary | ICD-10-CM | POA: Insufficient documentation

## 2021-06-25 DIAGNOSIS — Z91040 Latex allergy status: Secondary | ICD-10-CM | POA: Insufficient documentation

## 2021-06-25 DIAGNOSIS — I132 Hypertensive heart and chronic kidney disease with heart failure and with stage 5 chronic kidney disease, or end stage renal disease: Secondary | ICD-10-CM | POA: Diagnosis not present

## 2021-06-25 DIAGNOSIS — Z8546 Personal history of malignant neoplasm of prostate: Secondary | ICD-10-CM | POA: Diagnosis not present

## 2021-06-25 DIAGNOSIS — I5041 Acute combined systolic (congestive) and diastolic (congestive) heart failure: Secondary | ICD-10-CM | POA: Insufficient documentation

## 2021-06-25 DIAGNOSIS — Z7982 Long term (current) use of aspirin: Secondary | ICD-10-CM | POA: Insufficient documentation

## 2021-06-25 DIAGNOSIS — I2510 Atherosclerotic heart disease of native coronary artery without angina pectoris: Secondary | ICD-10-CM | POA: Diagnosis not present

## 2021-06-25 DIAGNOSIS — E1122 Type 2 diabetes mellitus with diabetic chronic kidney disease: Secondary | ICD-10-CM | POA: Diagnosis not present

## 2021-06-25 DIAGNOSIS — Z992 Dependence on renal dialysis: Secondary | ICD-10-CM | POA: Diagnosis not present

## 2021-06-25 DIAGNOSIS — T50901A Poisoning by unspecified drugs, medicaments and biological substances, accidental (unintentional), initial encounter: Secondary | ICD-10-CM

## 2021-06-25 LAB — COMPREHENSIVE METABOLIC PANEL
ALT: 13 U/L (ref 0–44)
AST: 27 U/L (ref 15–41)
Albumin: 4.2 g/dL (ref 3.5–5.0)
Alkaline Phosphatase: 69 U/L (ref 38–126)
Anion gap: 13 (ref 5–15)
BUN: 54 mg/dL — ABNORMAL HIGH (ref 8–23)
CO2: 29 mmol/L (ref 22–32)
Calcium: 9.4 mg/dL (ref 8.9–10.3)
Chloride: 93 mmol/L — ABNORMAL LOW (ref 98–111)
Creatinine, Ser: 5.63 mg/dL — ABNORMAL HIGH (ref 0.61–1.24)
GFR, Estimated: 10 mL/min — ABNORMAL LOW (ref >60)
Glucose, Bld: 163 mg/dL — ABNORMAL HIGH (ref 70–99)
Potassium: 4.2 mmol/L (ref 3.5–5.1)
Sodium: 135 mmol/L (ref 135–145)
Total Bilirubin: 1 mg/dL (ref 0.3–1.2)
Total Protein: 8.5 g/dL — ABNORMAL HIGH (ref 6.5–8.1)

## 2021-06-25 LAB — CBC WITH DIFFERENTIAL/PLATELET
Abs Immature Granulocytes: 0.03 10*3/uL (ref 0.00–0.07)
Basophils Absolute: 0 10*3/uL (ref 0.0–0.1)
Basophils Relative: 1 %
Eosinophils Absolute: 0.1 10*3/uL (ref 0.0–0.5)
Eosinophils Relative: 2 %
HCT: 37.5 % — ABNORMAL LOW (ref 39.0–52.0)
Hemoglobin: 12.1 g/dL — ABNORMAL LOW (ref 13.0–17.0)
Immature Granulocytes: 1 %
Lymphocytes Relative: 18 %
Lymphs Abs: 1.2 10*3/uL (ref 0.7–4.0)
MCH: 32.2 pg (ref 26.0–34.0)
MCHC: 32.3 g/dL (ref 30.0–36.0)
MCV: 99.7 fL (ref 80.0–100.0)
Monocytes Absolute: 0.7 10*3/uL (ref 0.1–1.0)
Monocytes Relative: 10 %
Neutro Abs: 4.5 10*3/uL (ref 1.7–7.7)
Neutrophils Relative %: 68 %
Platelets: 104 10*3/uL — ABNORMAL LOW (ref 150–400)
RBC: 3.76 MIL/uL — ABNORMAL LOW (ref 4.22–5.81)
RDW: 15 % (ref 11.5–15.5)
WBC: 6.6 10*3/uL (ref 4.0–10.5)
nRBC: 0 % (ref 0.0–0.2)

## 2021-06-25 LAB — TROPONIN I (HIGH SENSITIVITY)
Troponin I (High Sensitivity): 28 ng/L — ABNORMAL HIGH (ref <18)
Troponin I (High Sensitivity): 35 ng/L — ABNORMAL HIGH (ref <18)

## 2021-06-25 LAB — LACTIC ACID, PLASMA: Lactic Acid, Venous: 1.6 mmol/L (ref 0.5–1.9)

## 2021-06-25 LAB — CBG MONITORING, ED: Glucose-Capillary: 156 mg/dL — ABNORMAL HIGH (ref 70–99)

## 2021-06-25 LAB — BLOOD GAS, VENOUS
Acid-Base Excess: 4.7 mmol/L — ABNORMAL HIGH (ref 0.0–2.0)
Bicarbonate: 31.4 mmol/L — ABNORMAL HIGH (ref 20.0–28.0)
FIO2: 21
O2 Saturation: 49.9 %
Patient temperature: 98.6
pCO2, Ven: 61.1 mmHg — ABNORMAL HIGH (ref 44.0–60.0)
pH, Ven: 7.332 (ref 7.250–7.430)
pO2, Ven: 32.1 mmHg (ref 32.0–45.0)

## 2021-06-25 LAB — AMMONIA: Ammonia: 22 umol/L (ref 9–35)

## 2021-06-25 LAB — ACETAMINOPHEN LEVEL: Acetaminophen (Tylenol), Serum: <10 ug/mL — ABNORMAL LOW (ref 10–30)

## 2021-06-25 LAB — BRAIN NATRIURETIC PEPTIDE: B Natriuretic Peptide: >4500 pg/mL — ABNORMAL HIGH (ref 0.0–100.0)

## 2021-06-25 LAB — SALICYLATE LEVEL: Salicylate Lvl: <7 mg/dL — ABNORMAL LOW (ref 7.0–30.0)

## 2021-06-25 LAB — ETHANOL: Alcohol, Ethyl (B): <10 mg/dL (ref <10)

## 2021-06-25 MED ORDER — ONDANSETRON HCL 4 MG/2ML IJ SOLN
4.0000 mg | Freq: Once | INTRAMUSCULAR | Status: AC
  Administered 2021-06-25: 4 mg via INTRAVENOUS
  Filled 2021-06-25: qty 2

## 2021-06-25 NOTE — ED Provider Notes (Signed)
I personally evaluated the patient during the encounter and completed a history, physical, procedures, medical decision making to contribute to the overall care of the patient and decision making for the patient briefly, the patient is a 80 y.o. male brought in after episode of unresponsiveness after suspected drug use.  Patient with overall unremarkable vitals.  EKG shows sinus bradycardia with first-degree heart block.  This appears to be unchanged from priors.  Patient is more awake upon my evaluation.  He got Narcan with EMS with supposedly not much improvement.  He is awake and alert on my exam.  He is still somnolent and difficult to get a history from.  He states that he remembers going out this morning to pay his rent and then did some drugs that his girlfriend gave him.  He is not exactly sure what he took.  Upon chart review it does appear that he has a history of polysubstance abuse.  Does have a history of end-stage renal disease.  Supposedly has dialysis tomorrow.  Blood gas is overall unremarkable.  pH is 7.3, mild hypercarbia at 61.  Suspect that his symptoms are secondary to polysubstance but will allow him to metabolize while doing extensive work-up to evaluate for any lab abnormalities.  Vital signs appear to be normal and he does not have fever.  He seems to be quickly improving.  I will continue to help make medical decisions for this patient.  Please see my PAs note for further results, evaluation, disposition of the patient.  This chart was dictated using voice recognition software.  Despite best efforts to proofread,  errors can occur which can change the documentation meaning.    EKG Interpretation  Date/Time:  Monday June 25 2021 16:19:04 EDT Ventricular Rate:  56 PR Interval:  340 QRS Duration: 154 QT Interval:  540 QTC Calculation: 521 R Axis:   265 Text Interpretation: Sinus bradycardia with 1st degree A-V block Right bundle branch block Anteroseptal infarct , age  undetermined Abnormal ECG unchanged form prior Confirmed by Lennice Sites [id number]) on 06/25/2021 4:51:40 PM            Lennice Sites, DO 06/25/21 1723

## 2021-06-25 NOTE — ED Notes (Signed)
Patient transported to CT 

## 2021-06-25 NOTE — ED Provider Notes (Addendum)
[facility] DEPT Provider Note   CSN: [id number] Arrival date & time: 06/25/21  1607     History No chief complaint on file.   [name] is a 80 y.o. male with a past medical history of end-stage renal disease, polysubstance abuse, obesity, history of prostate cancer, hypertension.  There is a level 5 caveat due to altered mental status.  Was brought in by EMS for loss of consciousness.  Patient states that last thing he remembers was smoking crack.  Patient's girlfriend was present and states that he lost consciousness.  He was given intranasal Narcan twice without any response.  EMS placed an EJ in his neck and delivered IV Narcan and about 10 minutes later patient began to come to.  EMS reports that girlfriend did not give accurate responses.  Patient is oriented to person and place only.  He is not sure what days he goes to dialysis, how many times a week he goes to dialysis or when the last time he went to dialysis is.  He was last seen in the emergency department in August after heroin overdose.  Patient denies using other drugs besides crack today.  He is nauseous and actively vomiting.  Patient is currently on 2 L of oxygen at 93%.  He is unsure if he has baseline hypoxia or is on oxygen at home.    HPI     Past Medical History:  Diagnosis Date   Chronic kidney disease    renal insufficiency   Coronary artery disease    Diabetes mellitus    type 2-non-insulin   GERD (gastroesophageal reflux disease)    Gout    Hypertension    Obesity    Prostate cancer ([place])    Tobacco abuse counseling     Patient Active Problem List   Diagnosis Date Noted   End stage renal disease ([place])    Anemia    Acute combined systolic and diastolic heart failure (HCC)    CKD (chronic kidney disease) stage 5, GFR less than 15 ml/min (HCC)    NSVT (nonsustained ventricular tachycardia)    LBBB (left bundle branch block)    Class 2 severe obesity due to excess  calories with serious comorbidity and body mass index (BMI) of 38.0 to 38.9 in adult [facility])    Chest pain 04/19/2019   Shortness of breath 04/19/2019   Malignant neoplasm of prostate ([place]) 07/06/2018   Hypoglycemia 03/29/2017   Coronary artery disease    Hypertension    Diabetes mellitus    Chronic kidney disease     Past Surgical History:  Procedure Laterality Date   AV FISTULA PLACEMENT Left 04/26/2019   Procedure: INSERTION OF ARTERIOVENOUS (AV) GORE-TEX GRAFT ARM - using Gore Vascular Graft size 45cm;  Surgeon: Rosetta Posner, MD;  Location: [place];  Service: Vascular;  Laterality: Left;   CARDIAC CATHETERIZATION  02/08/02   CARDIAC CATHETERIZATION  11/20/10   drug eluting stent;mid left LAD, There is mild hypokinesis of the anterolateral wall. LVEF  is estimated at 50-55%   INSERTION OF BRONCHIAL STENT     went in groin to R chest   INSERTION OF DIALYSIS CATHETER Right 04/26/2019   Procedure: INSERTION OF HEMODIALYSIS CATHETER - using Palindrome Catheter size 23cm;  Surgeon: Rosetta Posner, MD;  Location: [place];  Service: Vascular;  Laterality: Right;   LEFT HEART CATH AND CORONARY ANGIOGRAPHY N/A 04/23/2019   Procedure: LEFT HEART CATH AND CORONARY ANGIOGRAPHY;  Surgeon: Sherren Mocha,  MD;  Location: [facility];  Service: Cardiovascular;  Laterality: N/A;   PENILE PROSTHESIS IMPLANT     '05       Family History  Problem Relation Age of Onset   Aneurysm Mother    Multiple myeloma Mother    Aneurysm Father    Cancer Brother    Prostate cancer Brother    Cancer Other    Prostate cancer Brother    Bone cancer Brother    Breast cancer Neg Hx    Colon cancer Neg Hx    Pancreatic cancer Neg Hx     Social History   Tobacco Use   Smoking status: Every Day    Packs/day: 0.50    Years: 50.00    Pack years: 25.00    Types: Cigarettes   Smokeless tobacco: Never   Tobacco comments:    3 cigs a day-05/02/2021  Vaping Use   Vaping Use: Never used  Substance Use Topics    Alcohol use: Not Currently    Comment: drank 5 beers 03-28-17   Drug use: Not Currently    Types: Cocaine, Marijuana    Home Medications Prior to Admission medications   Medication Sig Start Date End Date Taking? Authorizing Provider  allopurinol (ZYLOPRIM) 100 MG tablet Take 100 mg by mouth 2 (two) times daily.    [provider]  amLODipine (NORVASC) 5 MG tablet Take 5 mg by mouth daily.    [provider]  aspirin EC 81 MG tablet Take 81 mg by mouth daily.    [provider]  atorvastatin (LIPITOR) 40 MG tablet Take 40 mg by mouth at bedtime.    [provider]  calcitRIOL (ROCALTROL) 0.25 MCG capsule Take 0.5 mcg by mouth daily.    [provider]  carvedilol (COREG) 6.25 MG tablet Take 1 tablet (6.25 mg total) by mouth 2 (two) times daily with a meal. 04/28/19   Shelly Coss, MD  cholecalciferol (VITAMIN D) 1000 UNITS tablet Take 1,000 Units by mouth daily.    [provider]  docusate sodium (COLACE) 100 MG capsule Take 100 mg by mouth daily.    [provider]  doxycycline (VIBRAMYCIN) 100 MG capsule Take 1 capsule (100 mg total) by mouth 2 (two) times daily. 10/06/20   Fatima Blank, MD  gabapentin (NEURONTIN) 400 MG capsule Take 400 mg by mouth at bedtime.    [provider]  HYDROcodone-acetaminophen (NORCO/VICODIN) 5-325 MG tablet Take 1 tablet by mouth every 4 (four) hours as needed. 07/29/20   Tacy Learn, PA-C  isosorbide-hydrALAZINE (BIDIL) 20-37.5 MG tablet Take 1 tablet by mouth 3 (three) times daily. 04/28/19   Shelly Coss, MD  losartan (COZAAR) 100 MG tablet Take 100 mg by mouth daily.    [provider]  nitroGLYCERIN (NITROSTAT) 0.4 MG SL tablet Place 0.4 mg under the tongue every 5 (five) minutes as needed for chest pain. For chest pain    [provider]  omeprazole (PRILOSEC) 20 MG capsule Take 20 mg by mouth daily.    [provider]    Allergies     Latex  Review of Systems   Review of Systems  Unable to perform ROS: Mental status change   Physical Exam Updated Vital Signs BP 129/83   Pulse (!) 50   Temp (!) 96.1 F (35.6 C) (Rectal)   Resp 16   Ht 5' 8" (1.727 m)   Wt 82 kg   SpO2 99%  BMI 27.49 kg/m   Physical Exam Vitals and nursing note reviewed.  Constitutional:      General: He is not in acute distress.    Appearance: Normal appearance. He is well-developed. He is not toxic-appearing or diaphoretic.     Interventions: Nasal cannula in place.  HENT:     Head: Normocephalic and atraumatic.     Mouth/Throat:     Mouth: Mucous membranes are moist.  Eyes:     General: No visual field deficit or scleral icterus.    Extraocular Movements: Extraocular movements intact.     Conjunctiva/sclera: Conjunctivae normal.     Pupils: Pupils are equal, round, and reactive to light.     Comments: Point pupils noted  Cardiovascular:     Rate and Rhythm: Normal rate and regular rhythm.     Heart sounds: Normal heart sounds.     Comments: Dialysis graft left forearm with palpable thrill Pulmonary:     Effort: Pulmonary effort is normal. No respiratory distress.     Breath sounds: Normal breath sounds.  Abdominal:     General: There is no distension.     Palpations: Abdomen is soft.     Tenderness: There is no abdominal tenderness.  Musculoskeletal:     Cervical back: Normal range of motion and neck supple.  Skin:    General: Skin is warm and dry.  Neurological:     Mental Status: He is lethargic.     GCS: GCS eye subscore is 3. GCS verbal subscore is 4. GCS motor subscore is 6.     Cranial Nerves: Cranial nerves are intact. No facial asymmetry.     Sensory: Sensation is intact.     Motor: No weakness, tremor, abnormal muscle tone, seizure activity or pronator drift.     Comments: Patient is somnolent, awakens to voice and then slowly drifts back off to sleep.  Verbal responses are confused  Psychiatric:         Behavior: Behavior normal.    ED Results / Procedures / Treatments   Labs (all labs ordered are listed, but only abnormal results are displayed) Labs Reviewed  CBC WITH DIFFERENTIAL/PLATELET - Abnormal; Notable for the following components:      Result Value   RBC 3.76 (*)    Hemoglobin 12.1 (*)    HCT 37.5 (*)    Platelets 104 (*)    All other components within normal limits  COMPREHENSIVE METABOLIC PANEL - Abnormal; Notable for the following components:   Chloride 93 (*)    Glucose, Bld 163 (*)    BUN 54 (*)    Creatinine, Ser 5.63 (*)    Total Protein 8.5 (*)    GFR, Estimated 10 (*)    All other components within normal limits  ACETAMINOPHEN LEVEL - Abnormal; Notable for the following components:   Acetaminophen (Tylenol), Serum <10 (*)    All other components within normal limits  SALICYLATE LEVEL - Abnormal; Notable for the following components:   Salicylate Lvl <6.2 (*)    All other components within normal limits  BRAIN NATRIURETIC PEPTIDE - Abnormal; Notable for the following components:   B Natriuretic Peptide >4,500.0 (*)    All other components within normal limits  BLOOD GAS, VENOUS - Abnormal; Notable for the following components:   pCO2, Ven 61.1 (*)    Bicarbonate 31.4 (*)    Acid-Base Excess 4.7 (*)    All other components within normal limits  CBG MONITORING, ED - Abnormal; Notable for  the following components:   Glucose-Capillary 156 (*)    All other components within normal limits  TROPONIN I (HIGH SENSITIVITY) - Abnormal; Notable for the following components:   Troponin I (High Sensitivity) 28 (*)    All other components within normal limits  TROPONIN I (HIGH SENSITIVITY) - Abnormal; Notable for the following components:   Troponin I (High Sensitivity) 35 (*)    All other components within normal limits  CULTURE, BLOOD (ROUTINE X 2)  CULTURE, BLOOD (ROUTINE X 2)  AMMONIA  LACTIC ACID, PLASMA  ETHANOL    EKG EKG  Interpretation  Date/Time:  Monday June 25 2021 16:19:04 EDT Ventricular Rate:  56 PR Interval:  340 QRS Duration: 154 QT Interval:  540 QTC Calculation: 521 R Axis:   265 Text Interpretation: Sinus bradycardia with 1st degree A-V block Right bundle branch block Anteroseptal infarct , age undetermined Abnormal ECG unchanged form prior Confirmed by Lennice Sites [phone]) on 06/25/2021 4:51:40 PM  Radiology DG Chest 2 View  Result Date: 06/25/2021 CLINICAL DATA:  Respiratory failure.  Found unconscious. EXAM: CHEST - 2 VIEW COMPARISON:  Chest x-ray dated May 13, 2021. FINDINGS: Stable cardiomegaly. Normal pulmonary vascularity. Opacity at the medial right lung base. Aeration at the right lung base is overall improved compared to prior study. No pleural effusion or pneumothorax. No acute osseous abnormality. Elevated right hemidiaphragm. IMPRESSION: 1. Right basilar atelectasis versus infiltrate. Electronically Signed   By: Titus Dubin M.D.   On: 06/25/2021 18:27   CT HEAD WO CONTRAST  Result Date: 06/25/2021 CLINICAL DATA:  Mental status change. EXAM: CT HEAD WITHOUT CONTRAST TECHNIQUE: Contiguous axial images were obtained from the base of the skull through the vertex without intravenous contrast. COMPARISON:  CT head 11/13/2012. FINDINGS: Brain: No evidence of acute infarction, hemorrhage, hydrocephalus, extra-axial collection or mass lesion/mass effect. There is moderate diffuse atrophy. There is moderate periventricular white matter hypodensity, likely chronic small vessel ischemic change. There is an old lacunar infarct in the left basal ganglia. These findings are new from prior. Vascular: Atherosclerotic calcifications are present within the cavernous internal carotid arteries. Skull: Normal. Negative for fracture or focal lesion. Sinuses/Orbits: There are air-fluid levels and mucosal thickening of the bilateral maxillary sinuses. Mastoid air cells are clear. Other: There are 2 small  foci of air within the soft tissues lateral to the left orbit, of doubtful clinical significance. IMPRESSION: 1. No acute intracranial abnormality. 2. Bilateral acute maxillary sinusitis. 3. Moderate diffuse atrophy and moderate chronic small vessel ischemic change. Electronically Signed   By: Ronney Asters M.D.   On: 06/25/2021 18:51    Procedures Procedures   Medications Ordered in ED Medications  ondansetron (ZOFRAN) injection 4 mg (4 mg Intravenous Given 06/25/21 1726)    ED Course  I have reviewed the triage vital signs and the nursing notes.  Pertinent labs & imaging results that were available during my care of the patient were reviewed by me and considered in my medical decision making (see chart for details).    MDM Rules/Calculators/A&P                          Per SMS with nursing as temp was not acutally documented or verified before discharge: Nam Vossler,PA-C "last thing- can y'all get a repeat temp before dc?" Dwyane Dee, RN "97.6"  Patient arrives to the emergency department via EMS for loss of consciousness/altered mental status.  Patient bleeding supplemental oxygen support, pinpoint pupils,  somnolence appears consistent with opiate narcosis.  Patient now awake, alert and ambulatory in the emergency department.  He reports that his girlfriend found a bag of drugs in the house.  He was unsure what it was but decided to snort it. Patient reports that he goes to dialysis Tuesday Thursday Saturday and completed dialysis on Saturday.  He is now breathing on his own without supplementary oxygen.  The differential diagnosis for AMS is extensive and includes, but is not limited to: drug overdose - opioids, alcohol, sedatives, antipsychotics, drug withdrawal, others; Metabolic: hypoxia, hypoglycemia, hyperglycemia, hypercalcemia, hypernatremia, hyponatremia, uremia, hepatic encephalopathy, hypothyroidism, hyperthyroidism, vitamin B12 or thiamine deficiency, carbon monoxide  poisoning, Wilson's disease, Lactic acidosis, DKA/HHOS; Infectious: meningitis, encephalitis, bacteremia/sepsis, urinary tract infection, pneumonia, neurosyphilis; Structural: Space-occupying lesion, (brain tumor, subdural hematoma, hydrocephalus,); Vascular: stroke, subarachnoid hemorrhage, coronary ischemia, hypertensive encephalopathy, CNS vasculitis, thrombotic thrombocytopenic purpura, disseminated intravascular coagulation, hyperviscosity; Psychiatric: Schizophrenia, depression; Other: Seizure, hypothermia, heat stroke, ICU psychosis, dementia -"sundowning."  I ordered and reviewed labs that include CBC with mild normocytic anemia, mild thrombocytopenia, CMP with slightly elevated blood glucose of 163, elevated BUN and creatinine consistent with history of end-stage renal disease.  CO2 elevated at 61 which is just above normal and likely representative of compensated respiratory acidosis given respiratory suppression. Lactic acid within normal limits, ammonia within normal limits, troponin mildly elevated in the setting of end-stage renal disease likely due to poor clearance. BNP markedly elevated but no expeditions of pulmonary edema or crackles/rales on examination.  Patient does not appear volume overloaded.  I ordered and reviewed images including a CT head which shows no acute abnormalities, and a chest x-ray which shows right lower lobe atelectasis versus infiltrate.  Suspect atelectasis due to poor inspiratory effort.  EKG shows sinus bradycardia at a rate of 56 without evidence of acute ischemic changes.  Given patient's history of polysubstance abuse and admitted snorting of unknown drugs, clinical appearance including somnolence and pinpoint pupils I suspect opiate overdose.  Eventually responded to a few doses of Narcan.  He is back to baseline, ambulatory, not requiring any oxygen supplementation and normothermic.  I doubt any other cause of patient's symptoms today such as syncope,  seizure with postictal state, metabolic encephalopathy.  Patient appears otherwise appropriate for discharge at this time.  Seen in shared visit with Dr. Ronnald Nian. Final Clinical Impression(s) / ED Diagnoses Final diagnoses:  Accidental drug overdose, initial encounter    Rx / DC Orders ED Discharge Orders     None        Margarita Mail, PA-C 06/26/21 [place], [place], PA-C 06/26/21 [place], [place], DO 06/26/21 1648

## 2021-06-25 NOTE — Discharge Instructions (Addendum)
Please do not snort unknown drugs. This could lead to death. Avoid using all illicit substances. Return for any new or worsening symptoms

## 2021-06-25 NOTE — ED Notes (Signed)
Abigail, PA made aware patient's HR in the 40-50s.

## 2021-06-25 NOTE — ED Notes (Signed)
Patient back from CT.

## 2021-06-25 NOTE — ED Triage Notes (Signed)
Patient BIB [facility] from home. Patient was found unconscious by EMS. Narcan was given by EMS, 2 intranasal and 1 EJ. Patient has a hx of dialysis and has fistula in left arm. Patient is not A&Ox4.  130/90 6-8-RR 100-HR 159-CBG

## 2021-06-30 LAB — CULTURE, BLOOD (ROUTINE X 2)
Culture: NO GROWTH
Special Requests: ADEQUATE

## 2021-11-26 ENCOUNTER — Observation Stay (HOSPITAL_COMMUNITY)
Admission: EM | Admit: 2021-11-26 | Discharge: 2021-11-27 | Disposition: A | Discharge status: Home / Self Care | Payer: No Typology Code available for payment source | Attending: Family Medicine | Admitting: Family Medicine

## 2021-11-26 ENCOUNTER — Other Ambulatory Visit: Payer: Self-pay

## 2021-11-26 ENCOUNTER — Encounter (HOSPITAL_COMMUNITY): Payer: Self-pay | Admitting: Emergency Medicine

## 2021-11-26 ENCOUNTER — Emergency Department (HOSPITAL_COMMUNITY): Payer: No Typology Code available for payment source

## 2021-11-26 DIAGNOSIS — E1122 Type 2 diabetes mellitus with diabetic chronic kidney disease: Secondary | ICD-10-CM | POA: Diagnosis present

## 2021-11-26 DIAGNOSIS — N186 End stage renal disease: Secondary | ICD-10-CM | POA: Diagnosis not present

## 2021-11-26 DIAGNOSIS — Z7982 Long term (current) use of aspirin: Secondary | ICD-10-CM | POA: Diagnosis not present

## 2021-11-26 DIAGNOSIS — Z79899 Other long term (current) drug therapy: Secondary | ICD-10-CM | POA: Diagnosis not present

## 2021-11-26 DIAGNOSIS — K7460 Unspecified cirrhosis of liver: Secondary | ICD-10-CM | POA: Insufficient documentation

## 2021-11-26 DIAGNOSIS — Z91040 Latex allergy status: Secondary | ICD-10-CM | POA: Diagnosis not present

## 2021-11-26 DIAGNOSIS — Z992 Dependence on renal dialysis: Secondary | ICD-10-CM | POA: Insufficient documentation

## 2021-11-26 DIAGNOSIS — Z8546 Personal history of malignant neoplasm of prostate: Secondary | ICD-10-CM | POA: Diagnosis not present

## 2021-11-26 DIAGNOSIS — I132 Hypertensive heart and chronic kidney disease with heart failure and with stage 5 chronic kidney disease, or end stage renal disease: Secondary | ICD-10-CM | POA: Insufficient documentation

## 2021-11-26 DIAGNOSIS — F17210 Nicotine dependence, cigarettes, uncomplicated: Secondary | ICD-10-CM | POA: Insufficient documentation

## 2021-11-26 DIAGNOSIS — Z20822 Contact with and (suspected) exposure to covid-19: Secondary | ICD-10-CM | POA: Insufficient documentation

## 2021-11-26 DIAGNOSIS — J90 Pleural effusion, not elsewhere classified: Secondary | ICD-10-CM | POA: Diagnosis present

## 2021-11-26 DIAGNOSIS — R0602 Shortness of breath: Secondary | ICD-10-CM | POA: Diagnosis present

## 2021-11-26 DIAGNOSIS — I2510 Atherosclerotic heart disease of native coronary artery without angina pectoris: Secondary | ICD-10-CM | POA: Diagnosis not present

## 2021-11-26 DIAGNOSIS — I5022 Chronic systolic (congestive) heart failure: Secondary | ICD-10-CM

## 2021-11-26 DIAGNOSIS — B182 Chronic viral hepatitis C: Secondary | ICD-10-CM | POA: Diagnosis present

## 2021-11-26 DIAGNOSIS — I10 Essential (primary) hypertension: Secondary | ICD-10-CM | POA: Diagnosis present

## 2021-11-26 DIAGNOSIS — J40 Bronchitis, not specified as acute or chronic: Secondary | ICD-10-CM

## 2021-11-26 DIAGNOSIS — J449 Chronic obstructive pulmonary disease, unspecified: Secondary | ICD-10-CM | POA: Insufficient documentation

## 2021-11-26 DIAGNOSIS — F1010 Alcohol abuse, uncomplicated: Secondary | ICD-10-CM | POA: Diagnosis present

## 2021-11-26 DIAGNOSIS — J441 Chronic obstructive pulmonary disease with (acute) exacerbation: Secondary | ICD-10-CM | POA: Diagnosis not present

## 2021-11-26 DIAGNOSIS — Z5900 Homelessness unspecified: Secondary | ICD-10-CM | POA: Insufficient documentation

## 2021-11-26 DIAGNOSIS — C61 Malignant neoplasm of prostate: Secondary | ICD-10-CM | POA: Diagnosis present

## 2021-11-26 DIAGNOSIS — N185 Chronic kidney disease, stage 5: Secondary | ICD-10-CM | POA: Diagnosis present

## 2021-11-26 DIAGNOSIS — Z955 Presence of coronary angioplasty implant and graft: Secondary | ICD-10-CM | POA: Diagnosis not present

## 2021-11-26 DIAGNOSIS — D649 Anemia, unspecified: Secondary | ICD-10-CM | POA: Diagnosis present

## 2021-11-26 DIAGNOSIS — F1910 Other psychoactive substance abuse, uncomplicated: Secondary | ICD-10-CM

## 2021-11-26 LAB — RESP PANEL BY RT-PCR (FLU A&B, COVID) ARPGX2
Influenza A by PCR: NEGATIVE
Influenza B by PCR: NEGATIVE
SARS Coronavirus 2 by RT PCR: NEGATIVE

## 2021-11-26 LAB — COMPREHENSIVE METABOLIC PANEL
ALT: 12 U/L (ref 0–44)
AST: 45 U/L — ABNORMAL HIGH (ref 15–41)
Albumin: 3.5 g/dL (ref 3.5–5.0)
Alkaline Phosphatase: 69 U/L (ref 38–126)
Anion gap: 12 (ref 5–15)
BUN: 36 mg/dL — ABNORMAL HIGH (ref 8–23)
CO2: 30 mmol/L (ref 22–32)
Calcium: 9.5 mg/dL (ref 8.9–10.3)
Chloride: 89 mmol/L — ABNORMAL LOW (ref 98–111)
Creatinine, Ser: 5.16 mg/dL — ABNORMAL HIGH (ref 0.61–1.24)
GFR, Estimated: 11 mL/min — ABNORMAL LOW (ref >60)
Glucose, Bld: 92 mg/dL (ref 70–99)
Potassium: 5.2 mmol/L — ABNORMAL HIGH (ref 3.5–5.1)
Sodium: 131 mmol/L — ABNORMAL LOW (ref 135–145)
Total Bilirubin: 2.2 mg/dL — ABNORMAL HIGH (ref 0.3–1.2)
Total Protein: 7.7 g/dL (ref 6.5–8.1)

## 2021-11-26 LAB — CBC WITH DIFFERENTIAL/PLATELET
Abs Immature Granulocytes: 0.02 10*3/uL (ref 0.00–0.07)
Basophils Absolute: 0 10*3/uL (ref 0.0–0.1)
Basophils Relative: 0 %
Eosinophils Absolute: 0.3 10*3/uL (ref 0.0–0.5)
Eosinophils Relative: 6 %
HCT: 35.1 % — ABNORMAL LOW (ref 39.0–52.0)
Hemoglobin: 11.4 g/dL — ABNORMAL LOW (ref 13.0–17.0)
Immature Granulocytes: 0 %
Lymphocytes Relative: 16 %
Lymphs Abs: 0.9 10*3/uL (ref 0.7–4.0)
MCH: 31.9 pg (ref 26.0–34.0)
MCHC: 32.5 g/dL (ref 30.0–36.0)
MCV: 98.3 fL (ref 80.0–100.0)
Monocytes Absolute: 0.7 10*3/uL (ref 0.1–1.0)
Monocytes Relative: 12 %
Neutro Abs: 3.7 10*3/uL (ref 1.7–7.7)
Neutrophils Relative %: 66 %
Platelets: 123 10*3/uL — ABNORMAL LOW (ref 150–400)
RBC: 3.57 MIL/uL — ABNORMAL LOW (ref 4.22–5.81)
RDW: 14.7 % (ref 11.5–15.5)
WBC: 5.7 10*3/uL (ref 4.0–10.5)
nRBC: 0 % (ref 0.0–0.2)

## 2021-11-26 LAB — I-STAT VENOUS BLOOD GAS, ED
Acid-Base Excess: 11 mmol/L — ABNORMAL HIGH (ref 0.0–2.0)
Bicarbonate: 36.7 mmol/L — ABNORMAL HIGH (ref 20.0–28.0)
Calcium, Ion: 1.12 mmol/L — ABNORMAL LOW (ref 1.15–1.40)
HCT: 37 % — ABNORMAL LOW (ref 39.0–52.0)
Hemoglobin: 12.6 g/dL — ABNORMAL LOW (ref 13.0–17.0)
O2 Saturation: 76 %
Potassium: 4.7 mmol/L (ref 3.5–5.1)
Sodium: 131 mmol/L — ABNORMAL LOW (ref 135–145)
TCO2: 38 mmol/L — ABNORMAL HIGH (ref 22–32)
pCO2, Ven: 53.1 mmHg (ref 44–60)
pH, Ven: 7.448 — ABNORMAL HIGH (ref 7.25–7.43)
pO2, Ven: 40 mmHg (ref 32–45)

## 2021-11-26 LAB — BASIC METABOLIC PANEL
Anion gap: 11 (ref 5–15)
BUN: 38 mg/dL — ABNORMAL HIGH (ref 8–23)
CO2: 30 mmol/L (ref 22–32)
Calcium: 9.5 mg/dL (ref 8.9–10.3)
Chloride: 90 mmol/L — ABNORMAL LOW (ref 98–111)
Creatinine, Ser: 5.25 mg/dL — ABNORMAL HIGH (ref 0.61–1.24)
GFR, Estimated: 10 mL/min — ABNORMAL LOW (ref >60)
Glucose, Bld: 105 mg/dL — ABNORMAL HIGH (ref 70–99)
Potassium: 4.1 mmol/L (ref 3.5–5.1)
Sodium: 131 mmol/L — ABNORMAL LOW (ref 135–145)

## 2021-11-26 LAB — CBG MONITORING, ED: Glucose-Capillary: 293 mg/dL — ABNORMAL HIGH (ref 70–99)

## 2021-11-26 LAB — BRAIN NATRIURETIC PEPTIDE: B Natriuretic Peptide: >4500 pg/mL — ABNORMAL HIGH (ref 0.0–100.0)

## 2021-11-26 LAB — TROPONIN I (HIGH SENSITIVITY)
Troponin I (High Sensitivity): 30 ng/L — ABNORMAL HIGH (ref <18)
Troponin I (High Sensitivity): 31 ng/L — ABNORMAL HIGH (ref <18)

## 2021-11-26 MED ORDER — ASPIRIN EC 81 MG PO TBEC
81.0000 mg | DELAYED_RELEASE_TABLET | Freq: Every day | ORAL | Status: DC
  Administered 2021-11-26 – 2021-11-27 (×2): 81 mg via ORAL
  Filled 2021-11-26 (×2): qty 1

## 2021-11-26 MED ORDER — SODIUM CHLORIDE 0.9 % IV SOLN: 100.0000 mL | INTRAVENOUS | Status: DC | PRN

## 2021-11-26 MED ORDER — LORAZEPAM 1 MG PO TABS: 1.0000 mg | ORAL_TABLET | ORAL | Status: DC | PRN

## 2021-11-26 MED ORDER — ALBUTEROL SULFATE (2.5 MG/3ML) 0.083% IN NEBU: 2.5000 mg | INHALATION_SOLUTION | RESPIRATORY_TRACT | Status: DC | PRN

## 2021-11-26 MED ORDER — THIAMINE HCL 100 MG/ML IJ SOLN: 100.0000 mg | Freq: Every day | INTRAMUSCULAR | Status: DC

## 2021-11-26 MED ORDER — SODIUM CHLORIDE 0.9% FLUSH: 3.0000 mL | INTRAVENOUS | Status: DC | PRN

## 2021-11-26 MED ORDER — IPRATROPIUM-ALBUTEROL 0.5-2.5 (3) MG/3ML IN SOLN
3.0000 mL | Freq: Four times a day (QID) | RESPIRATORY_TRACT | Status: DC
  Administered 2021-11-26 – 2021-11-27 (×4): 3 mL via RESPIRATORY_TRACT
  Filled 2021-11-26 (×4): qty 3

## 2021-11-26 MED ORDER — HEPARIN SODIUM (PORCINE) 5000 UNIT/ML IJ SOLN
5000.0000 [IU] | Freq: Three times a day (TID) | INTRAMUSCULAR | Status: DC
  Administered 2021-11-26 – 2021-11-27 (×3): 5000 [IU] via SUBCUTANEOUS
  Filled 2021-11-26 (×3): qty 1

## 2021-11-26 MED ORDER — CHLORHEXIDINE GLUCONATE CLOTH 2 % EX PADS: 6.0000 | MEDICATED_PAD | Freq: Every day | CUTANEOUS | Status: DC

## 2021-11-26 MED ORDER — INSULIN ASPART 100 UNIT/ML IJ SOLN
0.0000 [IU] | Freq: Three times a day (TID) | INTRAMUSCULAR | Status: DC
  Administered 2021-11-26: 3 [IU] via SUBCUTANEOUS

## 2021-11-26 MED ORDER — LOSARTAN POTASSIUM 50 MG PO TABS
25.0000 mg | ORAL_TABLET | Freq: Every day | ORAL | Status: DC
  Administered 2021-11-27: 25 mg via ORAL
  Filled 2021-11-26: qty 1

## 2021-11-26 MED ORDER — PREDNISONE 20 MG PO TABS
40.0000 mg | ORAL_TABLET | Freq: Every day | ORAL | Status: DC
  Administered 2021-11-27: 40 mg via ORAL
  Filled 2021-11-26: qty 2

## 2021-11-26 MED ORDER — ADULT MULTIVITAMIN W/MINERALS CH
1.0000 | ORAL_TABLET | Freq: Every day | ORAL | Status: DC
  Administered 2021-11-26 – 2021-11-27 (×2): 1 via ORAL
  Filled 2021-11-26 (×2): qty 1

## 2021-11-26 MED ORDER — FOLIC ACID 1 MG PO TABS
1.0000 mg | ORAL_TABLET | Freq: Every day | ORAL | Status: DC
  Administered 2021-11-26 – 2021-11-27 (×2): 1 mg via ORAL
  Filled 2021-11-26 (×2): qty 1

## 2021-11-26 MED ORDER — HEPARIN SODIUM (PORCINE) 1000 UNIT/ML DIALYSIS
1000.0000 [IU] | INTRAMUSCULAR | Status: DC | PRN
  Filled 2021-11-26: qty 1

## 2021-11-26 MED ORDER — IPRATROPIUM-ALBUTEROL 0.5-2.5 (3) MG/3ML IN SOLN
3.0000 mL | Freq: Once | RESPIRATORY_TRACT | Status: AC
  Administered 2021-11-26: 3 mL via RESPIRATORY_TRACT
  Filled 2021-11-26: qty 3

## 2021-11-26 MED ORDER — PENTAFLUOROPROP-TETRAFLUOROETH EX AERO
1.0000 "application " | INHALATION_SPRAY | CUTANEOUS | Status: DC | PRN
  Filled 2021-11-26: qty 116

## 2021-11-26 MED ORDER — LIDOCAINE-PRILOCAINE 2.5-2.5 % EX CREA
1.0000 "application " | TOPICAL_CREAM | CUTANEOUS | Status: DC | PRN
  Filled 2021-11-26: qty 5

## 2021-11-26 MED ORDER — BOOST PO LIQD
237.0000 mL | Freq: Three times a day (TID) | ORAL | Status: DC
  Administered 2021-11-27: 237 mL via ORAL
  Filled 2021-11-26: qty 237

## 2021-11-26 MED ORDER — METHYLPREDNISOLONE SODIUM SUCC 125 MG IJ SOLR
125.0000 mg | Freq: Once | INTRAMUSCULAR | Status: AC
  Administered 2021-11-26: 125 mg via INTRAVENOUS
  Filled 2021-11-26: qty 2

## 2021-11-26 MED ORDER — SODIUM CHLORIDE 0.9% FLUSH: 3.0000 mL | Freq: Two times a day (BID) | INTRAVENOUS | Status: DC

## 2021-11-26 MED ORDER — ALTEPLASE 2 MG IJ SOLR: 2.0000 mg | Freq: Once | INTRAMUSCULAR | Status: DC | PRN

## 2021-11-26 MED ORDER — PANTOPRAZOLE SODIUM 40 MG PO TBEC
40.0000 mg | DELAYED_RELEASE_TABLET | Freq: Every day | ORAL | Status: DC
Start: 2021-11-26 — End: 2021-11-27
  Administered 2021-11-26 – 2021-11-27 (×2): 40 mg via ORAL
  Filled 2021-11-26 (×2): qty 1

## 2021-11-26 MED ORDER — ACETAMINOPHEN 325 MG PO TABS: 650.0000 mg | ORAL_TABLET | Freq: Four times a day (QID) | ORAL | Status: DC | PRN

## 2021-11-26 MED ORDER — UMECLIDINIUM BROMIDE 62.5 MCG/ACT IN AEPB
1.0000 | INHALATION_SPRAY | Freq: Every day | RESPIRATORY_TRACT | Status: DC
  Administered 2021-11-27: 1 via RESPIRATORY_TRACT
  Filled 2021-11-26: qty 7

## 2021-11-26 MED ORDER — DOCUSATE SODIUM 100 MG PO CAPS: 100.0000 mg | ORAL_CAPSULE | Freq: Every day | ORAL | Status: DC | PRN

## 2021-11-26 MED ORDER — LIDOCAINE HCL (PF) 1 % IJ SOLN: 5.0000 mL | INTRAMUSCULAR | Status: DC | PRN

## 2021-11-26 MED ORDER — THIAMINE HCL 100 MG PO TABS
100.0000 mg | ORAL_TABLET | Freq: Every day | ORAL | Status: DC
  Administered 2021-11-26 – 2021-11-27 (×2): 100 mg via ORAL
  Filled 2021-11-26 (×2): qty 1

## 2021-11-26 MED ORDER — LORAZEPAM 2 MG/ML IJ SOLN: 1.0000 mg | INTRAMUSCULAR | Status: DC | PRN

## 2021-11-26 MED ORDER — SODIUM CHLORIDE 0.9 % IV SOLN: 250.0000 mL | INTRAVENOUS | Status: DC | PRN

## 2021-11-26 MED ORDER — ACETAMINOPHEN 650 MG RE SUPP: 650.0000 mg | Freq: Four times a day (QID) | RECTAL | Status: DC | PRN

## 2021-11-26 NOTE — Assessment & Plan Note (Addendum)
81 year old male presenting with shortness of breath, tachypnea and diffuse wheezing found to be in acute COPD exacerbation ?-observation to telemetry  ?-oxygen fine on room air, but working to breath and still moderate wheezing with duonebs and steroids, he does feel better ?-continue scheduled duonebs and SABA  ?-start LAMA, needs PFTS and has been lost to f/u with pulm  ?-received IV steroids in ED>oral steroids  ?-no indication for abx  ?-encouraged smoking cessation  ?

## 2021-11-26 NOTE — Assessment & Plan Note (Signed)
Hx of PCI and most recent LHC in 03/2019 showing:  ?Mild RCA stenosis with severe stenosis in the mid/distal PDA (appropriate for medical therapy with small rea of myocardium supplied) ?2. Widely patent left main ?3. Diffusely diseased LAD with moderate proximal stenosis and moderate in-stent restenosis in the mid-vessel ?4. Widely patent left circumflex ?5. Moderately elevated LVEDP ?Unsure why not on statin, no allergy.  ?Continue ASA, hold coreg until UDS back.  ? ?

## 2021-11-26 NOTE — Assessment & Plan Note (Addendum)
History of prostate cancer-3 years ago, followed at [place]  ?

## 2021-11-26 NOTE — ED Provider Notes (Addendum)
[name] Provider Note  History   Chief Complaint  Patient presents with   Shortness of Breath   [name]. is a 81 y.o. male w/ h/o ESRD (HD T/T/Sa), CAD s/p PCI, DM, HFrEF (LVEF 30-35%), polysubstance abuse, obesity, h/o prostate CA, HTN who p/w SOB.   The history is provided by the patient.  Shortness of Breath Severity:  Moderate Onset quality:  Gradual Duration: 3-4 months. Timing:  Unable to specify Progression:  Worsening Chronicity:  Chronic Context: activity and smoke exposure   Context: not animal exposure and not occupational exposure   Relieved by:  Nothing Worsened by:  Exertion Ineffective treatments:  Inhaler, position changes and rest Associated symptoms: cough and wheezing   Associated symptoms: no abdominal pain, no chest pain, no fever, no headaches, no hemoptysis, no neck pain, no rash, no sore throat, no sputum production and no vomiting    Past Medical History:  Diagnosis Date   Chronic kidney disease    renal insufficiency   Coronary artery disease    Diabetes mellitus    type 2-non-insulin   GERD (gastroesophageal reflux disease)    Gout    Hypertension    Obesity    Prostate cancer ([place])    Tobacco abuse counseling     Social History   Tobacco Use   Smoking status: Every Day    Packs/day: 0.50    Years: 50.00    Pack years: 25.00    Types: Cigarettes   Smokeless tobacco: Never   Tobacco comments:    3 cigs a day-05/02/2021  Vaping Use   Vaping Use: Never used  Substance Use Topics   Alcohol use: Not Currently    Comment: drank 5 beers 03-28-17   Drug use: Not Currently    Types: Cocaine, Marijuana     Family History  Problem Relation Age of Onset   Aneurysm Mother    Multiple myeloma Mother    Aneurysm Father    Cancer Brother    Prostate cancer Brother    Cancer Other    Prostate cancer Brother    Bone cancer Brother    Breast cancer Neg Hx    Colon cancer Neg Hx     Pancreatic cancer Neg Hx     Review of Systems  Constitutional:  Negative for chills and fever.  HENT:  Negative for congestion and sore throat.   Eyes:  Negative for photophobia and visual disturbance.  Respiratory:  Positive for cough, shortness of breath and wheezing. Negative for hemoptysis and sputum production.   Cardiovascular:  Positive for leg swelling. Negative for chest pain.  Gastrointestinal:  Negative for abdominal pain, blood in stool, nausea and vomiting.  Endocrine: Negative.   Genitourinary:  Negative for flank pain and testicular pain.  Musculoskeletal:  Negative for neck pain and neck stiffness.  Skin:  Negative for rash and wound.  Allergic/Immunologic: Negative.   Neurological:  Negative for dizziness, syncope and headaches.  Hematological: Negative.   Psychiatric/Behavioral: Negative.      Physical Exam   Today's Vitals   11/26/21 0900 11/26/21 0910 11/26/21 0920 11/26/21 0930  BP: 112/75 114/85 112/72 112/72  Pulse: 65   65  Resp: _0 Temp:      TempSrc:      SpO2: 99%   99%  PainSc:         Physical Exam Vitals reviewed.  Constitutional:      Appearance: He  obese.  °   Comments: Chronically ill-appearing, slight increased WOB  °HENT:  °   Head: Normocephalic and atraumatic.  °   Nose: Nose normal. No congestion.  °   Mouth/Throat:  °   Mouth: Mucous membranes are moist.  °   Pharynx: Oropharynx is clear. No oropharyngeal exudate.  °Eyes:  °   Extraocular Movements: Extraocular movements intact.  °   Pupils: Pupils are equal, round, and reactive to light.  °Cardiovascular:  °   Rate and Rhythm: Normal rate and regular rhythm.  °   Pulses: Normal pulses.  °   Heart sounds: Normal heart sounds. No murmur heard. °   Comments: Palpable thrill along left forearm AV fistula °Pulmonary:  °   Effort: Prolonged expiration present.  °   Breath sounds: Decreased air movement present. Examination of the right-upper field reveals wheezing. Examination of the  left-upper field reveals wheezing. Examination of the right-middle field reveals wheezing. Examination of the left-middle field reveals wheezing. Examination of the right-lower field reveals decreased breath sounds and wheezing. Examination of the left-lower field reveals wheezing. Decreased breath sounds and wheezing present. No rhonchi.  °Abdominal:  °   Tenderness: There is no abdominal tenderness. There is no right CVA tenderness, left CVA tenderness, guarding or rebound.  °Musculoskeletal:     °   General: Normal range of motion.  °   Cervical back: Normal range of motion and neck supple.  °   Right lower leg: Edema present.  °   Left lower leg: Edema present.  °Skin: °   General: Skin is warm and dry.  °   Capillary Refill: Capillary refill takes less than 2 seconds.  °Neurological:  °   General: No focal deficit present.  °   Mental Status: He is alert and oriented to person, place, and time. Mental status is at baseline.  °   Cranial Nerves: No cranial nerve deficit.  °   Sensory: No sensory deficit.  °   Motor: No weakness.  °   Coordination: Coordination normal.  °Psychiatric:     °   Mood and Affect: Mood normal.     °   Behavior: Behavior normal.  ° ° °ED Course  °Procedures ° °Medical Decision Making:  °[name] is a 80 y.o. male w/ h/o ESRD (HD T/T/Sa), CAD s/p PCI, DM, HFrEF (LVEF 30-35%), polysubstance abuse, obesity, h/o prostate CA, HTN who p/w SOB.  ° °Patient states he has not missed any dialysis sessions recently, last full dialysis session on Saturday °[facility] and [facility]R 1 week ago due to shortness of breath °Patient states he was told that he had "fluid on his lungs", and has been waiting [facility] regarding [facility] °He presents to the ED today with ongoing shortness of breath °Patient states he has had worsening SOB over the last 3-4 months °Patient states that 2-3 months ago he had a right pleural effusion  requiring a thoracentesis.  He states he "felt better afterwards, but then his shortness of breath worsened approximately 2 weeks later" °Patient states he has been compliant with his home medications including albuterol q4h °Diffuse wheezing on exam with decreased air-movement bilaterally, improved with nebs in ED ° °ER provider interpretation of Imaging / Radiology:  °CXR: Moderate right pleural effusion with probable subsegmental atelectasis in the right lung base ° °ER provider interpretation of EKG:  °Sinus   Sinus rhythm with first-degree AV block (known) with PACs and aberrant conduction, RBBB (known)  ER provider interpretation of Labs:  CBC: WBC 5.7, Hgb 11.4 CMP: Na 131, K 5.2 (hemolyzed), no AKI on CKD (creatinine baseline), no elevated LFTs BNP: >4.5K (similar to prior), he is due for dialysis tomorrow and likely may indicate his volume overload state as he is approaching his scheduled dialysis VBG: No acidosis or hypercarbia Troponin: 30 (c/w baseline) COVID/flu: negative  Key medications administered in the ER:  Medications  ipratropium-albuterol (DUONEB) 0.5-2.5 (3) MG/3ML nebulizer solution 3 mL (3 mLs Nebulization Given 11/26/21 0732)  methylPREDNISolone sodium succinate (SOLU-MEDROL) 125 mg/2 mL injection 125 mg (125 mg Intravenous Given 11/26/21 0908)  ipratropium-albuterol (DUONEB) 0.5-2.5 (3) MG/3ML nebulizer solution 3 mL (3 mLs Nebulization Given 11/26/21 0906)    Diagnoses considered: Etiology likely: bronchitis w/ bronchospasm. Considered: CHF, ACS, COPD/asthma exacerbation, PNA, anaphylaxis, PE, PTX.    Do not suspect ACS at this time given EKG reveals no anatomical ischemia representing STEMI, new onset arrhythmia, or ischemic equivalent. No concerns for pericardial tamponade as patient is hemodynamically stable and no indication on EKG. Doubt pericarditis as no pain related to supine or prone positions and no diffuse ST elevation on EKG. Doubt pneumonia as CXR unremarkable for focal  airspace disease, afebrile, no cough, no leukocytosis. Doubt PTX given unremarkable CXR. Doubt esophageal tear as CXR unremarkable, no recent intractable emesis, no recent esophageal instrumentation. Doubt perforated abdominal viscus as no peritonitis or free air on CXR. Doubt aortic dissection as pain is not described as tearing and does not radiate to back, pulses present bilaterally in upper and lower extremities, and CXR does not show widened mediastinum.  Unlikely pulmonary embolism as patient denies estrogen supplementation. Denies malignancy with treatment in last 6 months. No previous Hx of DVT/PE. Patient denies hemoptysis. No unilateral leg swelling observed on exam. Oxygenation saturation has been maintained >95% & HR has been <100 since since arrival to the ED. No recent surgery or trauma to lower extremities or travel involving prolonged car or plane ride. Therefore will not obtain CTA chest or D-dimer.  Consulted: None  On reassessment at 0900, patient with improved aeration bilateral lung fields, still with some end expiratory wheezing, will order second DuoNeb.  On reassessment at 34, patient with improved aeration in bilateral lung fields, no prolonged expiratory phase, states he feels "much better."  Patient still has some mild work of breathing which she states is worse his baseline.  Remains satting 99% on room air.  Admit for WOB in context of bronchitis w/ bronchospasm.   Patient seen in conjunction with Dr. Benancio Deeds medical dictation software was used in the creation of this note.   Electronically signed by: Wynetta Fines, MD on 11/26/2021 at 10:00 AM  Clinical Impression:  1. Bronchitis    - Dispo: Imagene Riches, MD 11/26/21 2035    Wynetta Fines, MD 11/26/21 9323    Dorie Rank, MD 11/27/21 [phone]

## 2021-11-26 NOTE — Assessment & Plan Note (Signed)
Had last regular dialysis session on Saturday ?No emergent indication for dialysis, shortness of breath appears more from copd not volume overload ?Nephrology consulted  ?

## 2021-11-26 NOTE — ED Notes (Signed)
Ambulated patient in room, o2 dropped to 90 ?

## 2021-11-26 NOTE — Assessment & Plan Note (Signed)
States he drank 4 weeks ago, but unsure how accurate history is ?Will start prn CIWA ?MV, folic acid and thiamine  ?

## 2021-11-26 NOTE — H&P (Signed)
°History and Physical  ° ° °Patient: Bernard E Plascencia Jr. MRN:[id number] DOB: 12/05/1940 °DOA: 11/26/2021 °DOS: the patient was seen and examined on 11/26/2021 °PCP: Koirala, Dibas, MD  °Patient coming from: Home - lives with his girlfriend, scooter dependent  ° ° °Chief Complaint: shortness of breath  ° °HPI: Bernard E Krider Jr. is a 81 y.o. male with medical history significant of ESRD on dialysis TTS, CAD s/p PCI, T2DM, Htn, hx of prostate cancer, tobacco abuse,  systolic CHF, polysubstance abuse, chronic hep C with cirrhosis, COPD who presented to ED with complaints of shortness of breath x 2 weeks, worse with exertion. He states he doesn't really have a cough (very mild and intermittent). He has increased wheezing and tightness in his chest. Doesn't feel like his rescue inhaler is helping much and was using more frequently.  ° °He had a thoracentesis on the right side 2-3 months ago and he got better for 2 weeks then was short of breath again.  ° °No fever/chills, no URI symptoms, no headaches/dizziness, no chest pain or palpitations, no abdominal pain, no N/V/D, no dysuria (barely makes urine), no leg swelling, no weight gain. Denies any orthopnea.  ° °He smokes, but states he doesn't inhale and doesn't do that often. Declines nicotine patch. Drank 4-5 weeks ago, doesn't drink on regular basis, denies any withdrawal history.  ° °ER Course:  vitals: afebrile, bp: 114/79, HR: 75, RR: 20, oxygen: 96% °Pertinent labs: hgb: 11.4, platelets: 123, sodium: 131, potassium: 5.2, BUN: 36, creatinine: 5.16, troponin 30>31, bnp >4500 (baseline)   °Moderate right pleural effusion. Mild cardiomegaly °In ed given duoneb and solumedrol.  ° ° °Review of Systems: As mentioned in the history of present illness. All other systems reviewed and are negative. °Past Medical History:  °Diagnosis Date  ° Chronic kidney disease   ° renal insufficiency  ° Coronary artery disease   ° Diabetes mellitus   ° type 2-non-insulin  ° GERD  (gastroesophageal reflux disease)   ° Gout   ° Hypertension   ° Obesity   ° Prostate cancer ([facility])   ° Tobacco abuse counseling   ° °Past Surgical History:  °Procedure Laterality Date  ° AV FISTULA PLACEMENT Left 04/26/2019  ° Procedure: INSERTION OF ARTERIOVENOUS (AV) GORE-TEX GRAFT ARM - using Gore Vascular Graft size 45cm;  Surgeon: Early, Todd F, MD;  Location: [facility] OR;  Service: Vascular;  Laterality: Left;  ° CARDIAC CATHETERIZATION  02/08/02  ° CARDIAC CATHETERIZATION  11/20/10  ° drug eluting stent;mid left LAD, There is mild hypokinesis of the anterolateral wall. LVEF  is estimated at 50-55%  ° INSERTION OF BRONCHIAL STENT    ° went in groin to R chest  ° INSERTION OF DIALYSIS CATHETER Right 04/26/2019  ° Procedure: INSERTION OF HEMODIALYSIS CATHETER - using Palindrome Catheter size 23cm;  Surgeon: Early, Todd F, MD;  Location: [facility] OR;  Service: Vascular;  Laterality: Right;  ° LEFT HEART CATH AND CORONARY ANGIOGRAPHY N/A 04/23/2019  ° Procedure: LEFT HEART CATH AND CORONARY ANGIOGRAPHY;  Surgeon: Cooper, Michael, MD;  Location: [facility] INVASIVE CV LAB;  Service: Cardiovascular;  Laterality: N/A;  ° PENILE PROSTHESIS IMPLANT    ° '05  ° °Social History:  reports that he has been smoking cigarettes. He has a 25.00 pack-year smoking history. He has never used smokeless tobacco. He reports that he does not currently use alcohol. He reports that he does not currently use drugs after having used the following drugs: Cocaine and Marijuana. ° °Allergies  °  Allergen Reactions  ° Latex Itching and Hives  ° ° °Family History  °Problem Relation Age of Onset  ° Aneurysm Mother   ° Multiple myeloma Mother   ° Aneurysm Father   ° Cancer Brother   ° Prostate cancer Brother   ° Cancer Other   ° Prostate cancer Brother   ° Bone cancer Brother   ° Breast cancer Neg Hx   ° Colon cancer Neg Hx   ° Pancreatic cancer Neg Hx   ° ° °Prior to Admission medications   °Medication Sig Start Date End Date Taking? Authorizing Provider  °albuterol  (VENTOLIN HFA) 108 (90 Base) MCG/ACT inhaler Inhale 2 puffs into the lungs every 4 (four) hours as needed for shortness of breath. 08/01/21  Yes [provider]  °aspirin EC 81 MG tablet Take 81 mg by mouth daily.   Yes [provider]  °carvedilol (COREG) 6.25 MG tablet Take 1 tablet (6.25 mg total) by mouth 2 (two) times daily with a meal. 04/28/19  Yes Adhikari, Amrit, MD  °docusate sodium (COLACE) 100 MG capsule Take 100 mg by mouth daily as needed for mild constipation.   Yes [provider]  °Ensure (ENSURE) Take 237 mLs by mouth 3 (three) times daily between meals. Dialysis formula needed   Yes [provider]  °losartan (COZAAR) 50 MG tablet Take 25 mg by mouth daily.   Yes [provider]  °omeprazole (PRILOSEC) 20 MG capsule Take 20 mg by mouth daily.   Yes [provider]  ° ° °Physical Exam: °Vitals:  ° 11/26/21 1145 11/26/21 1200 11/26/21 1230 11/26/21 1612  °BP: 104/67 111/79 121/82 132/72  °Pulse: 67 70 72 74  °Resp: 12 19 18 (!) 23  °Temp:      °TempSrc:      °SpO2: 98% 96% 100% 98%  ° °General:  Appears calm and comfortable and is in NAD °Eyes:  PERRL, EOMI, normal lids, iris °ENT:  grossly normal hearing, lips & tongue, mmm; appropriate dentition °Neck:  no LAD, masses or thyromegaly; no carotid bruits °Cardiovascular:  RRR, no m/r/g. No LE edema.  °Respiratory:   diffuse expiratory wheezing throughout. Decreased breath sounds in RLL  Normal respiratory effort. °Abdomen:  soft, NT, ND, NABS °Back:   normal alignment, no CVAT °Skin:  no rash or induration seen on limited exam °Musculoskeletal:  grossly normal tone BUE/BLE, good ROM, no bony abnormality °Lower extremity:  No LE edema.  Limited foot exam with no ulcerations.  2+ distal pulses. °Psychiatric:  grossly normal mood and affect, speech fluent and appropriate, AOx3 °Neurologic:  CN 2-12 grossly intact, moves all extremities in coordinated fashion, sensation intact ° ° °Radiological Exams on  Admission: °Independently reviewed - see discussion in A/P where applicable ° °DG Chest 2 View ° °Result Date: 11/26/2021 °CLINICAL DATA:  80-year-old male with history of shortness of breath. EXAM: CHEST - 2 VIEW COMPARISON:  Chest x-ray 06/25/2021. FINDINGS: Moderate right pleural effusion. Probable subsegmental atelectasis in the right lung base. Left lung is clear. No left pleural effusion. No pneumothorax. No evidence of pulmonary edema. Heart size is mildly enlarged. Upper mediastinal contours are within normal limits. IMPRESSION: 1. Moderate right pleural effusion with probable subsegmental atelectasis in the right lung base. 2. Mild cardiomegaly. Electronically Signed   By: Daniel  Entrikin M.D.   On: 11/26/2021 07:28   ° °EKG: Independently reviewed.  NSR with rate 75, 1st degree block with PAC; nonspecific ST changes with no evidence of acute ischemia.   RBBB. Similar ekg except new t wave inversion in lead III.    Labs on Admission: I have personally reviewed the available labs and imaging studies at the time of the admission.  Pertinent labs:    hgb: 11.4,  platelets: 123,  sodium: 131,  potassium: 5.2,  BUN: 36,  creatinine: 5.16  troponin 30>31  bnp >4500,    Assessment and Plan: * COPD with acute exacerbation ([place]) 81 year old male presenting with shortness of breath, tachypnea and diffuse wheezing found to be in acute COPD exacerbation -observation to telemetry  -oxygen fine on room air, but working to breath and still moderate wheezing with duonebs and steroids, he does feel better -continue scheduled duonebs and SABA  -start LAMA, needs PFTS and has been lost to f/u with pulm  -received IV steroids in ED>oral steroids  -no indication for abx  -encouraged smoking cessation   ESRD on dialysis [facility]) Had last regular dialysis session on Saturday No emergent indication for dialysis, shortness of breath appears more from copd not volume overload Nephrology consulted   Pleural  effusion on right Pleural effusion dates back to 09/2020.  Had right thoracentesis on 05/02/2021 with 2 week improvement in SOB then symptoms returned Never followed back up with pulmonology  Path showed: Reactive mesothelial cells present and Numerous lymphoid cells  Moderate effusion on CXR again, may benefit from pulm consult vs. Outpatient f/u   Polysubstance abuse ([place]) Heroine, cocaine, tobacco, alcohol-states he has not used in one year; however, ED visit in 06/2021 for drug OD UDS pending  Holding coreg at this time Declines nicotine patch    Coronary artery disease Hx of PCI and most recent LHC in 03/2019 showing:  Mild RCA stenosis with severe stenosis in the mid/distal PDA (appropriate for medical therapy with small rea of myocardium supplied) 2. Widely patent left main 3. Diffusely diseased LAD with moderate proximal stenosis and moderate in-stent restenosis in the mid-vessel 4. Widely patent left circumflex 5. Moderately elevated LVEDP Unsure why not on statin, no allergy.  Continue ASA, hold coreg until UDS back.    Chronic systolic CHF (congestive heart failure) (HCC) Appears euvolemic Last echo 7/22: EF of 30-35% with LV with diffuse hypokinesis. Indeterminate diastolic parameters -volume control with dialysis -I/O -encouraged cocaine cessation -hold beta blocker until UDS results, continue cozaar   Type 2 diabetes mellitus with chronic kidney disease, without long-term current use of insulin (HCC) a1c of 5.6 in 2020 Appears diet controlled, will check a1c today Very sensitive SSI and accuchecks    Hypertension Well controlled.  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 Dates back 2004.  [facility]    Anemia Likely secondary to CKD, at baseline Continue to monitor   Malignant neoplasm of prostate ([place]) History of prostate cancer-3 years ago, followed at [place]:   Code Status: Full Code   Consults: nephrology: Dr. Jonnie Finner   DVT Prophylaxis: heparin   Family Communication: none   Severity of Illness: The appropriate patient status for this patient is OBSERVATION. Observation status is judged to be reasonable and necessary in order to provide the required intensity of service to ensure the patient's safety. The patient's presenting symptoms, physical exam findings,  and initial radiographic and laboratory data in the context of their medical condition is felt to place them at decreased risk for further clinical deterioration. Furthermore, it is anticipated that the patient will be medically stable for discharge from the hospital within 2 midnights of admission.   Author: Orma Flaming, MD 11/26/2021 5:43 PM  For on call review [email or web address].

## 2021-11-26 NOTE — Assessment & Plan Note (Signed)
a1c of 5.6 in 2020 ?Appears diet controlled, will check a1c today ?Very sensitive SSI and accuchecks  ? ?

## 2021-11-26 NOTE — Assessment & Plan Note (Addendum)
Pleural effusion dates back to 09/2020.  ?Had right thoracentesis on 05/02/2021 with 2 week improvement in SOB then symptoms returned ?Never followed back up with pulmonology  ?Path showed: Reactive mesothelial cells present and Numerous lymphoid cells  ?Moderate effusion on CXR again, may benefit from pulm consult vs. Outpatient f/u  ?

## 2021-11-26 NOTE — ED Notes (Signed)
Urinal given to get spec.  ?

## 2021-11-26 NOTE — ED Triage Notes (Signed)
Pt reports that his [place] provider sent him for a xray due to SOB a week ago which indicated that he had "fluid on my lungs."  Pt stated that he waited for treatment however that was a week ago and he continues to have SOB mainly upon exertion.  ? ?Pt goes to dialysis Tuesday/Thursday/Saturdays with this past Saturday being a full treatment.   ?

## 2021-11-26 NOTE — Assessment & Plan Note (Addendum)
Well controlled.  ?Continue home medication: cozaar ?Holding coreg until UDS shows if using cocaine  ?

## 2021-11-26 NOTE — ED Notes (Signed)
Lunch tray given. 

## 2021-11-26 NOTE — Assessment & Plan Note (Signed)
Liver enzymes stable. Dates back 2004.  ?[facility]  ?

## 2021-11-26 NOTE — Assessment & Plan Note (Addendum)
Heroine, cocaine, tobacco, alcohol-states he has not used in one year; however, ED visit in 06/2021 for drug OD ?UDS pending  ?Holding coreg at this time ?Declines nicotine patch  ? ?

## 2021-11-26 NOTE — Assessment & Plan Note (Signed)
Appears euvolemic ?Last echo 7/22: EF of 30-35% with LV with diffuse hypokinesis. Indeterminate diastolic parameters ?-volume control with dialysis ?-I/O ?-encouraged cocaine cessation ?-hold beta blocker until UDS results, continue cozaar  ?

## 2021-11-26 NOTE — Assessment & Plan Note (Signed)
Likely secondary to CKD, at baseline ?Continue to monitor  ?

## 2021-11-27 DIAGNOSIS — J441 Chronic obstructive pulmonary disease with (acute) exacerbation: Secondary | ICD-10-CM | POA: Diagnosis not present

## 2021-11-27 LAB — CBC
HCT: 34.9 % — ABNORMAL LOW (ref 39.0–52.0)
Hemoglobin: 11.7 g/dL — ABNORMAL LOW (ref 13.0–17.0)
MCH: 32.4 pg (ref 26.0–34.0)
MCHC: 33.5 g/dL (ref 30.0–36.0)
MCV: 96.7 fL (ref 80.0–100.0)
Platelets: 108 10*3/uL — ABNORMAL LOW (ref 150–400)
RBC: 3.61 MIL/uL — ABNORMAL LOW (ref 4.22–5.81)
RDW: 14.6 % (ref 11.5–15.5)
WBC: 7 10*3/uL (ref 4.0–10.5)
nRBC: 0 % (ref 0.0–0.2)

## 2021-11-27 LAB — HEPATITIS B SURFACE ANTIBODY,QUALITATIVE: Hep B S Ab: REACTIVE — AB

## 2021-11-27 LAB — BASIC METABOLIC PANEL
Anion gap: 14 (ref 5–15)
BUN: 56 mg/dL — ABNORMAL HIGH (ref 8–23)
CO2: 28 mmol/L (ref 22–32)
Calcium: 9.8 mg/dL (ref 8.9–10.3)
Chloride: 87 mmol/L — ABNORMAL LOW (ref 98–111)
Creatinine, Ser: 6.14 mg/dL — ABNORMAL HIGH (ref 0.61–1.24)
GFR, Estimated: 9 mL/min — ABNORMAL LOW (ref >60)
Glucose, Bld: 121 mg/dL — ABNORMAL HIGH (ref 70–99)
Potassium: 4.9 mmol/L (ref 3.5–5.1)
Sodium: 129 mmol/L — ABNORMAL LOW (ref 135–145)

## 2021-11-27 LAB — HEMOGLOBIN A1C
Hgb A1c MFr Bld: 5.6 % (ref 4.8–5.6)
Mean Plasma Glucose: 114.02 mg/dL

## 2021-11-27 LAB — RAPID URINE DRUG SCREEN, HOSP PERFORMED
Amphetamines: NOT DETECTED
Barbiturates: NOT DETECTED
Benzodiazepines: NOT DETECTED
Cocaine: POSITIVE — AB
Opiates: NOT DETECTED
Tetrahydrocannabinol: NOT DETECTED

## 2021-11-27 LAB — CBG MONITORING, ED
Glucose-Capillary: 112 mg/dL — ABNORMAL HIGH (ref 70–99)
Glucose-Capillary: 114 mg/dL — ABNORMAL HIGH (ref 70–99)

## 2021-11-27 LAB — HEPATITIS B SURFACE ANTIGEN: Hepatitis B Surface Ag: NONREACTIVE

## 2021-11-27 MED ORDER — CARVEDILOL 3.125 MG PO TABS: 3.1250 mg | ORAL_TABLET | Freq: Two times a day (BID) | ORAL | 0 refills | Status: DC

## 2021-11-27 MED ORDER — PREDNISONE 20 MG PO TABS: 40.0000 mg | ORAL_TABLET | Freq: Every day | ORAL | 0 refills | Status: AC

## 2021-11-27 NOTE — Progress Notes (Signed)
Requested by nephrologist to f/u with pt's clinic to confirm pt can receive treatment today after ED d/c. Contacted [facility] and spoke to [place], [place] who confirms pt receives HD at [facility] clinic. Pt did contact clinic and clinic is able to provide treatment today as long as pt arrives by noon. Clinic did request that pt arrive at clinic as soon as possible. This info provided to MD and RN. Navigator unable to reach pt via phone. RN confirms this is pt's plan. [facility] aware [facility] to d/c from ED and arrive to receive treatment as soon as he can. ? ?Melven Sartorius ?Renal Navigator ?[phone] ?

## 2021-11-27 NOTE — ED Notes (Signed)
Consent signed at bedside.  °

## 2021-11-27 NOTE — ED Notes (Signed)
Breakfast Order placed ?

## 2021-11-27 NOTE — Discharge Summary (Signed)
PatientPhysician Discharge Summary  [name]. YSA:[id number] DOB: 20-May-1941 DOA: 11/26/2021  PCP: [name], MD  Admit date: 11/26/2021 Discharge date: 11/27/2021    Admitted From: Home Disposition: Home  Recommendations for Outpatient Follow-up:  Follow up with PCP in 1-2 weeks Please obtain BMP/CBC in one week Please follow up with your PCP on the following pending results: Unresulted Labs (From admission, onward)     Start     Ordered   11/27/21 0825  Hepatitis B surface antibody  (New Admission [facility] (Hepatitis B))  ONCE - STAT,   STAT       Question:  Specimen collection method  Answer:  Lab=Lab collect   11/27/21 0824   11/27/21 0825  Hepatitis B surface antibody,quantitative  (New Admission [facility] (Hepatitis B))  ONCE - STAT,   STAT       Question:  Specimen collection method  Answer:  Lab=Lab collect   11/27/21 0824   11/27/21 0825  Hepatitis B surface antigen  (New Admission [facility] (Hepatitis B))  ONCE - STAT,   STAT       Question:  Specimen collection method  Answer:  Lab=Lab collect   11/27/21 0824   11/26/21 1331  Expectorated Sputum Assessment w Gram Stain, Rflx to Resp Cult  (COPD / Pneumonia / Cellulitis / Lower Extremity Wound)  Once,   R        11/26/21 1334              Home Health: None Equipment/Devices: None  Discharge Condition: Stable CODE STATUS: Full code Diet recommendation: Cardiac/renal  Subjective: Seen and examined.  He states that he is feeling much better.  Very minimal shortness of breath but he feels like he is back to baseline and he is willing to go home today.  Following HPI and ED course is copied from my colleague admitting hospitalist Dr. Shelby Mattocks H&P  HPI: [name]. is a 81 y.o. male with medical history significant of ESRD on dialysis TTS, CAD s/p PCI, T2DM, Htn, hx of prostate cancer, tobacco abuse,  systolic CHF, polysubstance abuse, chronic hep C with cirrhosis, COPD who presented to ED with  complaints of shortness of breath x 2 weeks, worse with exertion. He states he doesn't really have a cough (very mild and intermittent). He has increased wheezing and tightness in his chest. Doesn't feel like his rescue inhaler is helping much and was using more frequently.    He had a thoracentesis on the right side 2-3 months ago and he got better for 2 weeks then was short of breath again.    No fever/chills, no URI symptoms, no headaches/dizziness, no chest pain or palpitations, no abdominal pain, no N/V/D, no dysuria (barely makes urine), no leg swelling, no weight gain. Denies any orthopnea.    He smokes, but states he doesn't inhale and doesn't do that often. Declines nicotine patch. Drank 4-5 weeks ago, doesn't drink on regular basis, denies any withdrawal history.    ER Course:  vitals: afebrile, bp: 114/79, HR: 75, RR: 20, oxygen: 96% Pertinent labs: hgb: 11.4, platelets: 123, sodium: 131, potassium: 5.2, BUN: 36, creatinine: 5.16, troponin 30>31, bnp >4500 (baseline)   Moderate right pleural effusion. Mild cardiomegaly In ed given duoneb and solumedrol.   Brief/Interim Summary: Briefly, patient was admitted due to acute COPD exacerbation.  He was never hypoxic during the brief hospitalization.  He was started on DuoNeb, prednisone.  Patient had chronic right-sided pleural effusion but  again he was not having any symptoms regarding to that.  He appeared euvolemic.  He gets his hemodialysis TTS schedule.  When seen this morning, he was doing much better.  He did have bilateral diffuse expiratory wheezes but according to patient, this is normal for him.  It is very typical for him to get short of breath even with walking 20 feet from his car to the home.  He feels at his baseline and he is willing to go home.  He is due for dialysis today.  He has called his outpatient dialysis unit and they have confirmed with him that they will wait for him to do the dialysis this afternoon.  He is being  discharged home.  He has been cleared by nephrology.  I have prescribed him 4 days of prednisone.  Discharge plan was discussed with patient and/or family member and they verbalized understanding and agreed with it.  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   aspirin EC 81 MG tablet Take 81 mg by mouth daily.   carvedilol 3.125 MG tablet Commonly known as: COREG Take 1 tablet (3.125 mg total) by mouth 2 (two) times daily with a meal. What changed:  medication strength how much to take   docusate sodium 100 MG capsule Commonly known as: COLACE Take 100 mg by mouth daily as needed for mild constipation.   Ensure Take 237 mLs by mouth 3 (three) times daily between meals. Dialysis formula needed   losartan 50 MG tablet Commonly known as: COZAAR Take 25 mg by mouth daily.   omeprazole 20 MG capsule Commonly known as: PRILOSEC Take 20 mg by mouth daily.   predniSONE 20 MG tablet Commonly known as: DELTASONE Take 2 tablets (40 mg total) by mouth daily with breakfast for 4 days. Start taking on: November 28, 2021        Follow-up Information     Koirala, Dibas, MD Follow up in 1 week(s).   Specialty: Family  Medicine Contact information: [place] [address] [address] .   Specialty: General Practice Contact information: [place] [id number] [phone]                Allergies  Allergen Reactions   Latex Itching and Hives    Consultations: Nephrology   Procedures/Studies: DG Chest 2 View  Result Date: 11/26/2021 CLINICAL DATA:  81 year old male with history of shortness of breath. EXAM: CHEST - 2 VIEW COMPARISON:  Chest x-ray 06/25/2021. FINDINGS: Moderate right pleural effusion. Probable subsegmental atelectasis  in the right lung base. Left lung is clear. No left pleural effusion. No pneumothorax. No evidence of pulmonary edema. Heart size is mildly enlarged. Upper mediastinal contours are within normal limits. IMPRESSION: 1. Moderate right pleural effusion with probable subsegmental atelectasis in the right lung base. 2. Mild cardiomegaly. Electronically Signed   By: Vinnie Langton M.D.   On: 11/26/2021 07:28     Discharge Exam: Vitals:   11/27/21 0800 11/27/21 0900  BP: 118/82 123/86  Pulse: 85 85  Resp: 15 (!) 27  Temp:    SpO2: 97% 98%   Vitals:   11/27/21 0730 11/27/21 0730 11/27/21 0800 11/27/21 0900  BP: 132/90  118/82 123/86  Pulse: 80  85 85  Resp: (!) 22  15 (!) 27  Temp:  97.9 F (36.6 C)    TempSrc:  Oral    SpO2: 98%  97% 98%    General: Pt is alert, awake, not in acute distress Cardiovascular: RRR, S1/S2 +, no rubs, no gallops Respiratory: Scattered bilateral expiratory wheezes no rhonchi Abdominal: Soft, NT, ND, bowel sounds + Extremities: no edema, no cyanosis    The results of significant diagnostics from this hospitalization (including imaging, microbiology, ancillary and laboratory) are listed below for reference.     Microbiology: Recent Results (from the past 240 hour(s))  Resp Panel by RT-PCR (Flu A&B, Covid) Nasopharyngeal Swab     Status:  None   Collection Time: 11/26/21  7:30 AM   Specimen: Nasopharyngeal Swab; Nasopharyngeal(NP) swabs in vial transport medium  Result Value Ref Range Status   SARS Coronavirus 2 by RT PCR NEGATIVE NEGATIVE Final    Comment: (NOTE) SARS-CoV-2 target nucleic acids are NOT DETECTED.  The SARS-CoV-2 RNA is generally detectable in upper respiratory specimens during the acute phase of infection. The lowest concentration of SARS-CoV-2 viral copies this assay can detect is 138 copies/mL. A negative result does not preclude SARS-Cov-2 infection and should not be used as the sole basis for treatment or other patient management decisions. A negative result may occur with  improper specimen collection/handling, submission of specimen other than nasopharyngeal swab, presence of viral mutation(s) within the areas targeted by this assay, and inadequate number of viral copies(<138 copies/mL). A negative result must be combined with clinical observations, patient history, and epidemiological information. The expected result is Negative.  Fact Sheet for Patients:  EntrepreneurPulse.com.au  Fact Sheet for Healthcare Providers:  IncredibleEmployment.be  This test is no t yet approved or cleared by the [place] FDA and  has been authorized for detection and/or diagnosis of SARS-CoV-2 by FDA under an Emergency Use Authorization (EUA). This EUA will remain  in effect (meaning this test can be used) for the duration of the COVID-19 declaration under Section 564(b)(1) of the Act, 21 U.S.C.section 360bbb-3(b)(1), unless the authorization is terminated  or revoked sooner.       Influenza A by PCR NEGATIVE NEGATIVE Final   Influenza B by PCR NEGATIVE NEGATIVE Final    Comment: (NOTE) The Xpert Xpress SARS-CoV-2/FLU/RSV plus assay is intended as an aid in the diagnosis of influenza from Nasopharyngeal swab specimens and should not be used as a sole basis for  treatment. Nasal washings and aspirates are unacceptable for Xpert Xpress SARS-CoV-2/FLU/RSV testing.  Fact Sheet for Patients: EntrepreneurPulse.com.au  Fact Sheet for Healthcare Providers: IncredibleEmployment.be  This test is not yet approved or cleared by the [place] FDA and has been authorized for detection and/or diagnosis of SARS-CoV-2 by FDA under an Emergency Use Authorization (  EUA). This EUA will remain in effect (meaning this test can be used) for the duration of the COVID-19 declaration under Section 564(b)(1) of the Act, 21 U.S.C. section 360bbb-3(b)(1), unless the authorization is terminated or revoked.  Performed at [facility], [place] [address] [place], [place] [address]      Labs: BNP (last 3 results) Recent Labs    06/25/21 1654 11/26/21 0911  BNP >4,500.0* >2,585.2*   Basic Metabolic Panel: Recent Labs  Lab 11/26/21 0908 11/26/21 0910 11/26/21 1118 11/27/21 0408  NA 131* 131* 131* 129*  K 4.7 5.2* 4.1 4.9  CL  --  89* 90* 87*  CO2  --  '30 30 28  '$ GLUCOSE  --  92 105* 121*  BUN  --  36* 38* 56*  CREATININE  --  5.16* 5.25* 6.14*  CALCIUM  --  9.5 9.5 9.8   Liver Function Tests: Recent Labs  Lab 11/26/21 0910  AST 45*  ALT 12  ALKPHOS 69  BILITOT 2.2*  PROT 7.7  ALBUMIN 3.5   No results for input(s): LIPASE, AMYLASE in the last 168 hours. No results for input(s): AMMONIA in the last 168 hours. CBC: Recent Labs  Lab 11/26/21 0908 11/26/21 0910 11/27/21 0408  WBC  --  5.7 7.0  NEUTROABS  --  3.7  --   HGB 12.6* 11.4* 11.7*  HCT 37.0* 35.1* 34.9*  MCV  --  98.3 96.7  PLT  --  123* 108*   Cardiac Enzymes: No results for input(s): CKTOTAL, CKMB, CKMBINDEX, TROPONINI in the last 168 hours. BNP: Invalid input(s): POCBNP CBG: Recent Labs  Lab 11/26/21 1644 11/27/21 0406 11/27/21 0738  GLUCAP 293* 112* 114*   D-Dimer No results for input(s): DDIMER in the last 72 hours. Hgb  A1c Recent Labs    11/27/21 0408  HGBA1C 5.6   Lipid Profile No results for input(s): CHOL, HDL, LDLCALC, TRIG, CHOLHDL, LDLDIRECT in the last 72 hours. Thyroid function studies No results for input(s): TSH, T4TOTAL, T3FREE, THYROIDAB in the last 72 hours.  Invalid input(s): FREET3 Anemia work up No results for input(s): VITAMINB12, FOLATE, FERRITIN, TIBC, IRON, RETICCTPCT in the last 72 hours. Urinalysis    Component Value Date/Time   COLORURINE YELLOW 03/15/2019 1644   APPEARANCEUR CLEAR 03/15/2019 1644   LABSPEC 1.013 03/15/2019 1644   PHURINE 5.0 03/15/2019 1644   GLUCOSEU 50 (A) 03/15/2019 1644   HGBUR SMALL (A) 03/15/2019 1644   BILIRUBINUR NEGATIVE 03/15/2019 1644   KETONESUR NEGATIVE 03/15/2019 1644   PROTEINUR >=300 (A) 03/15/2019 1644   NITRITE NEGATIVE 03/15/2019 1644   LEUKOCYTESUR NEGATIVE 03/15/2019 1644   Sepsis Labs Invalid input(s): PROCALCITONIN,  WBC,  LACTICIDVEN Microbiology Recent Results (from the past 240 hour(s))  Resp Panel by RT-PCR (Flu A&B, Covid) Nasopharyngeal Swab     Status: None   Collection Time: 11/26/21  7:30 AM   Specimen: Nasopharyngeal Swab; Nasopharyngeal(NP) swabs in vial transport medium  Result Value Ref Range Status   SARS Coronavirus 2 by RT PCR NEGATIVE NEGATIVE Final    Comment: (NOTE) SARS-CoV-2 target nucleic acids are NOT DETECTED.  The SARS-CoV-2 RNA is generally detectable in upper respiratory specimens during the acute phase of infection. The lowest concentration of SARS-CoV-2 viral copies this assay can detect is 138 copies/mL. A negative result does not preclude SARS-Cov-2 infection and should not be used as the sole basis for treatment or other patient management decisions. A negative result may occur with  improper specimen collection/handling, submission of  specimen other than nasopharyngeal swab, presence of viral mutation(s) within the areas targeted by this assay, and inadequate number of viral copies(<138  copies/mL). A negative result must be combined with clinical observations, patient history, and epidemiological information. The expected result is Negative.  Fact Sheet for Patients:  EntrepreneurPulse.com.au  Fact Sheet for Healthcare Providers:  IncredibleEmployment.be  This test is no t yet approved or cleared by the [place] FDA and  has been authorized for detection and/or diagnosis of SARS-CoV-2 by FDA under an Emergency Use Authorization (EUA). This EUA will remain  in effect (meaning this test can be used) for the duration of the COVID-19 declaration under Section 564(b)(1) of the Act, 21 U.S.C.section 360bbb-3(b)(1), unless the authorization is terminated  or revoked sooner.       Influenza A by PCR NEGATIVE NEGATIVE Final   Influenza B by PCR NEGATIVE NEGATIVE Final    Comment: (NOTE) The Xpert Xpress SARS-CoV-2/FLU/RSV plus assay is intended as an aid in the diagnosis of influenza from Nasopharyngeal swab specimens and should not be used as a sole basis for treatment. Nasal washings and aspirates are unacceptable for Xpert Xpress SARS-CoV-2/FLU/RSV testing.  Fact Sheet for Patients: EntrepreneurPulse.com.au  Fact Sheet for Healthcare Providers: IncredibleEmployment.be  This test is not yet approved or cleared by the [place] FDA and has been authorized for detection and/or diagnosis of SARS-CoV-2 by FDA under an Emergency Use Authorization (EUA). This EUA will remain in effect (meaning this test can be used) for the duration of the COVID-19 declaration under Section 564(b)(1) of the Act, 21 U.S.C. section 360bbb-3(b)(1), unless the authorization is terminated or revoked.  Performed at [facility], [place] [address], [place], [place] [address]      Time coordinating discharge: Over 30 minutes  SIGNED:   Darliss Cheney, MD  [facility] 11/27/2021, 10:01 AM *Please  note that this is a verbal dictation therefore any spelling or grammatical errors are due to the "[place] [facility]" system interpretation. If 7PM-7AM, please contact night-coverage [email or web address]

## 2021-11-28 LAB — HEPATITIS B SURFACE ANTIBODY, QUANTITATIVE: Hep B S AB Quant (Post): 98.7 m[IU]/mL (ref >9.9)

## 2022-01-11 DIAGNOSIS — C61 Malignant neoplasm of prostate: Secondary | ICD-10-CM | POA: Diagnosis not present

## 2022-01-11 DIAGNOSIS — F1410 Cocaine abuse, uncomplicated: Secondary | ICD-10-CM | POA: Diagnosis not present

## 2022-01-11 DIAGNOSIS — Z789 Other specified health status: Secondary | ICD-10-CM | POA: Diagnosis not present

## 2022-01-11 DIAGNOSIS — I2510 Atherosclerotic heart disease of native coronary artery without angina pectoris: Secondary | ICD-10-CM | POA: Diagnosis not present

## 2022-01-11 DIAGNOSIS — F17210 Nicotine dependence, cigarettes, uncomplicated: Secondary | ICD-10-CM | POA: Diagnosis not present

## 2022-01-11 DIAGNOSIS — Z0001 Encounter for general adult medical examination with abnormal findings: Secondary | ICD-10-CM | POA: Diagnosis not present

## 2022-01-11 DIAGNOSIS — N189 Chronic kidney disease, unspecified: Secondary | ICD-10-CM | POA: Diagnosis not present

## 2022-02-06 ENCOUNTER — Ambulatory Visit (INDEPENDENT_AMBULATORY_CARE_PROVIDER_SITE_OTHER): Payer: No Typology Code available for payment source | Admitting: Pulmonary Disease

## 2022-02-06 ENCOUNTER — Encounter: Payer: Self-pay | Admitting: Pulmonary Disease

## 2022-02-06 VITALS — BP 118/64 | HR 77 | Ht 67.0 in | Wt 180.0 lb

## 2022-02-06 DIAGNOSIS — J90 Pleural effusion, not elsewhere classified: Secondary | ICD-10-CM | POA: Diagnosis not present

## 2022-02-06 DIAGNOSIS — R0602 Shortness of breath: Secondary | ICD-10-CM

## 2022-02-06 MED ORDER — ALBUTEROL SULFATE HFA 108 (90 BASE) MCG/ACT IN AERS: 2.0000 | INHALATION_SPRAY | RESPIRATORY_TRACT | 5 refills | Status: AC | PRN

## 2022-02-06 MED ORDER — STIOLTO RESPIMAT 2.5-2.5 MCG/ACT IN AERS: 2.0000 | INHALATION_SPRAY | Freq: Every day | RESPIRATORY_TRACT | 11 refills | Status: DC

## 2022-02-06 NOTE — Patient Instructions (Addendum)
Nice to see you again ? ?For the shortness of breath, lets try an inhaler called Stiolto.  2 puffs once a day.  Use it in the morning.  Since the albuterol helps, I am hopeful this will help more and reduce how often you need the albuterol. ? ?We will meet in a few weeks and if shortness of breath needs to be an issue, we will discuss draining the fluid off the right lung again.  This procedure is called a thoracentesis. ? ?An effort to improve your breathing and stamina, I recommend you walk to your car and back to your front door 3 times a day.  I do think some of the shortness of breath could be related to the muscles being weak over time. ? ?Return to clinic in 8 weeks or sooner as needed with Dr. Silas Flood ?

## 2022-02-06 NOTE — Progress Notes (Signed)
? ?'@Patient'$  ID: Bernard Cousin., [name]    DOB: [birth year], 81 y.o.   MRN: [id number] ? ?Chief Complaint  ?Patient presents with  ? Follow-up  ?  Pt states a little bit of issues with his breathing. Pt states overall he is doing well.   ? ? ?Referring provider: ?Sheela Stack, MD ? ?HPI:  ? ?81 y.o. man whom we are seeing in follow up  for evaluation of dyspnea on exertion, right pleural effusion.  Discharge summary 11/2021 reviewed. ? ?Doing ok.  Breathing relatively stable.  On further questioning may be a bit worse.  Recently admitted to the hospital.  His with hypoxia quickly resolved.  He was wheezy.  This felt to be related to reactive airways versus COPD exacerbation.  (No PFTs).  He was discharged quickly after antibiotics and prednisone.  Chest x-ray reviewed which demonstrated similar appearing right-sided effusion on my interpretation.  This is improved in the interim on chest x-ray 06/2021 after thoracentesis on 04/2021. ? ?His dyspnea seems largely unchanged to prior.  Very inactive.  Only really walks from door past the car.  Uses scooter to get around otherwise. ? ?HPI at initial visit: ?Patient is here for fluid around the lung.  Sent by his [place] doctor.  Report of chest x-ray 02-2021 with right-sided pleural effusion.  CT chest obtained shortly thereafter report comments on large right-sided pleural effusion with description of atelectasis of the right lower lobe and likely atelectasis of part of the right middle lobe, no discrete mass or lymphadenopathy.  Reviewed most recent chest x-ray [facility] system 09/2020 chest x-ray while in the ED notable for small right-sided pleural effusion with associated right-sided linear infiltrate, pneumonia versus atelectasis.  He was treated for pneumonia with doxycycline at that time.  No further follow-up imaging in our system. ? ?Patient reports dyspnea on exertion for last several months.  Worse with inclines or stairs.  Present over flat surfaces as well.  No time  of day where things are better or worse.  No position where things are better or worse.  Uses albuterol with mild relief at times.  No seasonal or environmental factors that make things better or worse.  No other alleviating or exacerbating factors.  Has dialysis Tuesday, Thursday, Saturday, unfortunately, he has come to clinic on Tuesday and miss his dialysis today.  Notes that they often cannot get him to the goal fluid removal.  Says this is been the case for many months to years.  He denies any symptoms with dialysis such as shortness of breath, nausea, vomiting, hypotension that would preclude full ultrafiltrate.  Unclear what his dry weight is or what the target is. ? ?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% ?Surgical history: AV fistula placement, cardiac cath status post PCI LAD 2012 ?Family history: Mother history of aneurysm, myeloma, father history of aneurysm ?Social history: Current smoker, down to half pack, cutting back, lives in [place], worked as a Furniture conservator/restorer ? ? ?Questionaires / Pulmonary Flowsheets:  ? ?ACT:  ?   ? View : No data to display.  ?  ?  ?  ? ? ?MMRC: ?   ? View : No data to display.  ?  ?  ?  ? ? ?Epworth:  ?   ? View : No data to display.  ?  ?  ?  ? ? ?Tests:  ? ?FENO:  ?No results found for: NITRICOXIDE ? ?PFT: ?   ? View : No data to display.  ?  ?  ?  ? ? ?  WALK:  ?   ? View : No data to display.  ?  ?  ?  ? ? ?Imaging: ?Personally reviewed as per EMR discussion this note ? ?Lab Results: ?Personally reviewed ?CBC ?   ?Component Value Date/Time  ? WBC 7.0 11/27/2021 0408  ? RBC 3.61 (L) 11/27/2021 0408  ? HGB 11.7 (L) 11/27/2021 0408  ? HCT 34.9 (L) 11/27/2021 0408  ? PLT 108 (L) 11/27/2021 0408  ? MCV 96.7 11/27/2021 0408  ? MCH 32.4 11/27/2021 0408  ? MCHC 33.5 11/27/2021 0408  ? RDW 14.6 11/27/2021 0408  ? LYMPHSABS 0.9 11/26/2021 0910  ? MONOABS 0.7 11/26/2021 0910  ? EOSABS 0.3 11/26/2021 0910  ? BASOSABS 0.0 11/26/2021 0910  ? ? ?BMET ?   ?Component Value  Date/Time  ? NA 129 (L) 11/27/2021 0408  ? K 4.9 11/27/2021 0408  ? CL 87 (L) 11/27/2021 0408  ? CO2 28 11/27/2021 0408  ? GLUCOSE 121 (H) 11/27/2021 0408  ? BUN 56 (H) 11/27/2021 0408  ? CREATININE 6.14 (H) 11/27/2021 0408  ? CREATININE 3.07 (HH) 07/03/2018 1515  ? CALCIUM 9.8 11/27/2021 0408  ? GFRNONAA 9 (L) 11/27/2021 0408  ? GFRNONAA 18 (L) 07/03/2018 1515  ? GFRAA 12 (L) 08/14/2019 1206  ? GFRAA 21 (L) 07/03/2018 1515  ? ? ?BNP ?   ?Component Value Date/Time  ? BNP >4,500.0 (H) 11/26/2021 0911  ? ? ?ProBNP ?   ?Component Value Date/Time  ? PROBNP 45.0 11/20/2010 1609  ? ? ?Specialty Problems   ? ?  ? Pulmonary Problems  ? Shortness of breath  ? Chronic obstructive lung disease ([place])  ? COPD with acute exacerbation ([place])  ? Pleural effusion on right  ? ? ?Allergies  ?Allergen Reactions  ? Latex Itching and Hives  ? ? ?Immunization History  ?Administered Date(s) Administered  ? Fluad Quad(high Dose 65+) 06/06/2020  ? Hepatitis A, Adult 03/09/2003  ? Hepatitis B, Dialysis 01/15/2020, 02/08/2020, 04/06/2020, 07/11/2020  ? Hepatitis B, adult 11/17/2002, 03/09/2003, 04/06/2020, 07/11/2020  ? Influenza, High Dose Seasonal PF 06/17/2014  ? Influenza, Seasonal, Injecte, Preservative Fre 11/23/2010, 07/15/2011, 10/05/2013  ? Influenza,inj,Quad PF,6+ Mos 07/16/2016, 01/14/2017  ? Influenza-Unspecified 07/01/1997, 06/23/2002, 07/13/2003, 07/26/2004, 09/30/2005, 07/08/2006, 06/24/2007, 06/23/2008, 06/23/2009, 05/24/2012, 06/23/2014, 06/22/2015, 08/20/2017, 06/08/2018, 06/24/2019  ? Moderna Sars-Covid-2 Vaccination 10/26/2019, 11/23/2019, 07/27/2020, 01/09/2021  ? Pneumococcal Conjugate-13 05/26/2014, 07/20/2015  ? Pneumococcal Polysaccharide-23 09/23/1993, 08/14/2009, 04/23/2016  ? Pneumococcal-Unspecified 01/19/2004  ? Tdap 12/22/2008, 01/16/2009, 07/20/2015  ? Zoster Recombinat (Shingrix) 06/22/2018, 12/31/2019  ? Zoster, Live 11/23/2010  ? ? ?Past Medical History:  ?Diagnosis Date  ? Chronic kidney disease   ? renal  insufficiency  ? Coronary artery disease   ? 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 GERD (gastroesophageal reflux disease)   ? Gout   ? Hypertension   ? Obesity   ? Prostate cancer ([place])   ? Tobacco abuse counseling   ? ? ?Tobacco History: ?Social History  ? ?Tobacco Use  ?Smoking Status Former  ? Packs/day: 0.50  ? Years: 50.00  ? Pack years: 25.00  ? Types: Cigarettes  ? Quit date: 01/30/2022  ? Years since quitting: 0.0  ?Smokeless Tobacco Never  ?Tobacco Comments  ? 3 cigs a day-05/02/2021  ? ?Counseling given: Not Answered ?Tobacco comments: 3 cigs a day-05/02/2021 ? ? ? ?Outpatient Encounter Medications as of 02/06/2022  ?Medication Sig  ? aspirin EC 81 MG tablet Take 81 mg by mouth daily.  ? docusate sodium (COLACE) 100 MG  capsule Take 100 mg by mouth daily as needed for mild constipation.  ? Ensure (ENSURE) Take 237 mLs by mouth 3 (three) times daily between meals. Dialysis formula needed  ? isosorbide-hydrALAZINE (BIDIL) 20-37.5 MG tablet TAKE 1 TABLET BY MOUTH TWICE A DAY (APPROVED)  ? losartan (COZAAR) 50 MG tablet Take 25 mg by mouth daily.  ? omeprazole (PRILOSEC) 20 MG capsule Take 20 mg by mouth daily.  ? Tiotropium Bromide-Olodaterol (STIOLTO RESPIMAT) 2.5-2.5 MCG/ACT AERS Inhale 2 puffs into the lungs daily.  ? [DISCONTINUED] albuterol (VENTOLIN HFA) 108 (90 Base) MCG/ACT inhaler Inhale 2 puffs into the lungs every 4 (four) hours as needed for shortness of breath.  ? albuterol (VENTOLIN HFA) 108 (90 Base) MCG/ACT inhaler Inhale 2 puffs into the lungs every 4 (four) hours as needed for shortness of breath.  ? carvedilol (COREG) 3.125 MG tablet Take 1 tablet (3.125 mg total) by mouth 2 (two) times daily with a meal.  ? ?No facility-administered encounter medications on file as of 02/06/2022.  ? ? ? ?Review of Systems ? ?Review of Systems  ?N/a ?Physical Exam ? ?BP 118/64 (BP Location: Right Arm, Patient Position: Sitting, Cuff Size: Normal)   Pulse 77   Ht '5\' 7"'$  (1.702 m)   Wt 180 lb (81.6 kg)    SpO2 100%   BMI 28.19 kg/m?  ? ?Wt Readings from Last 5 Encounters:  ?02/06/22 180 lb (81.6 kg)  ?06/25/21 180 lb 12.4 oz (82 kg)  ?05/13/21 180 lb (81.6 kg)  ?05/02/21 181 lb 6.4 oz (82.3 kg)  ?04/17/21 184 lb 9.6

## 2022-04-04 ENCOUNTER — Ambulatory Visit (INDEPENDENT_AMBULATORY_CARE_PROVIDER_SITE_OTHER): Payer: No Typology Code available for payment source | Admitting: Pulmonary Disease

## 2022-04-04 ENCOUNTER — Encounter: Payer: Self-pay | Admitting: Pulmonary Disease

## 2022-04-04 ENCOUNTER — Ambulatory Visit (INDEPENDENT_AMBULATORY_CARE_PROVIDER_SITE_OTHER): Payer: No Typology Code available for payment source

## 2022-04-04 VITALS — BP 126/64 | HR 105 | Temp 98.5°F | Ht 68.0 in | Wt 176.6 lb

## 2022-04-04 DIAGNOSIS — J90 Pleural effusion, not elsewhere classified: Secondary | ICD-10-CM

## 2022-04-04 DIAGNOSIS — R0609 Other forms of dyspnea: Secondary | ICD-10-CM | POA: Diagnosis not present

## 2022-04-04 MED ORDER — STIOLTO RESPIMAT 2.5-2.5 MCG/ACT IN AERS: 2.0000 | INHALATION_SPRAY | Freq: Every day | RESPIRATORY_TRACT | 11 refills | Status: DC

## 2022-04-04 NOTE — Patient Instructions (Addendum)
We will get a CXR   We will plan to drain fluid off the lungs On Wednesday if fluid still on chest xray  We will do the procedure at [facility] if needed  I refilled the stiolto - use 2 puffs once a day every day

## 2022-04-04 NOTE — H&P (View-Only) (Signed)
$'@Patient'r$  ID: Bernard Cousin., male    DOB: 03-22-1941, 81 y.o.   MRN: [id number]  Chief Complaint  Patient presents with   Follow-up    Pt states breathing has been bad for about 2 weeks now. Pt states he had dialysis today. Pt states he takes Stilito daily and Albuterol as needed. Pt states it does help.    Referring provider: [name], MD  HPI:   81 y.o. man whom we are seeing in follow up  for evaluation of dyspnea on exertion, right pleural effusion.   Doing ok.  Breathing Seems worse. Dialysis nurses saying it is bad. Using [employer]. Thinks it helps some.   HPI at initial visit: Patient is here for fluid around the lung.  Sent by his [place] doctor.  Report of chest x-ray 02-2021 with right-sided pleural effusion.  CT chest obtained shortly thereafter report comments on large right-sided pleural effusion with description of atelectasis of the right lower lobe and likely atelectasis of part of the right middle lobe, no discrete mass or lymphadenopathy.  Reviewed most recent chest x-ray [facility] 09/2020 chest x-ray while in the ED notable for small right-sided pleural effusion with associated right-sided linear infiltrate, pneumonia versus atelectasis.  He was treated for pneumonia with doxycycline at that time.  No further follow-up imaging in our system.  Patient reports dyspnea on exertion for last several months.  Worse with inclines or stairs.  Present over flat surfaces as well.  No time of day where things are better or worse.  No position where things are better or worse.  Uses albuterol with mild relief at times.  No seasonal or environmental factors that make things better or worse.  No other alleviating or exacerbating factors.  Has dialysis Tuesday, Thursday, Saturday, unfortunately, he has come to clinic on Tuesday and miss his dialysis today.  Notes that they often cannot get him to the goal fluid removal.  Says this is been the case for many months to years.  He denies  any symptoms with dialysis such as shortness of breath, nausea, vomiting, hypotension that would preclude full ultrafiltrate.  Unclear what his dry weight is or what the target is.  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 / Pulmonary Flowsheets:   ACT:      No data to display           MMRC:     No data to display           Epworth:      No data to display           Tests:   FENO:  No results found for: "NITRICOXIDE"  PFT:     No data to display           WALK:      No data to display           Imaging: Personally reviewed as per EMR discussion this note  Lab Results: Personally reviewed CBC    Component Value Date/Time   WBC 7.0 11/27/2021 0408   RBC 3.61 (L) 11/27/2021 0408   HGB 11.7 (L) 11/27/2021 0408   HCT 34.9 (L) 11/27/2021 0408   PLT 108 (L) 11/27/2021 0408  MCV 96.7 11/27/2021 0408   MCH 32.4 11/27/2021 0408   MCHC 33.5 11/27/2021 0408   RDW 14.6 11/27/2021 0408   LYMPHSABS 0.9 11/26/2021 0910   MONOABS 0.7 11/26/2021 0910   EOSABS 0.3 11/26/2021 0910   BASOSABS 0.0 11/26/2021 0910    BMET    Component Value Date/Time   NA 129 (L) 11/27/2021 0408   K 4.9 11/27/2021 0408   CL 87 (L) 11/27/2021 0408   CO2 28 11/27/2021 0408   GLUCOSE 121 (H) 11/27/2021 0408   BUN 56 (H) 11/27/2021 0408   CREATININE 6.14 (H) 11/27/2021 0408   CREATININE 3.07 (HH) 07/03/2018 1515   CALCIUM 9.8 11/27/2021 0408   GFRNONAA 9 (L) 11/27/2021 0408   GFRNONAA 18 (L) 07/03/2018 1515   GFRAA 12 (L) 08/14/2019 1206   GFRAA 21 (L) 07/03/2018 1515    BNP    Component Value Date/Time   BNP >4,500.0 (H) 11/26/2021 0911    ProBNP    Component  Value Date/Time   PROBNP 45.0 11/20/2010 1609    Specialty Problems       Pulmonary Problems   Shortness of breath   Chronic obstructive lung disease (HCC)   COPD with acute exacerbation (H[facility])   Pleural effusion on right    Allergies  Allergen Reactions   Latex Itching and Hives    Immunization History  Administered Date(s) Administered   Fluad Quad(high Dose 65+) 06/06/2020, 06/26/2021   Hepatitis A, Adult 03/09/2003   Hepatitis B, Dialysis 01/15/2020, 02/08/2020, 04/06/2020, 07/11/2020   Hepatitis B, adult 11/17/2002, 03/09/2003, 04/06/2020, 07/11/2020   Influenza, High Dose Seasonal PF 06/17/2014   Influenza, Seasonal, Injecte, Preservative Fre 11/23/2010, 07/15/2011, 10/05/2013   Influenza,inj,Quad PF,6+ Mos 07/16/2016, 01/14/2017   Influenza-Unspecified 07/01/1997, 06/23/2002, 07/13/2003, 07/26/2004, 09/30/2005, 07/08/2006, 06/24/2007, 06/23/2008, 06/23/2009, 05/24/2012, 06/23/2014, 06/22/2015, 08/20/2017, 06/08/2018, 06/24/2019   Moderna Covid-19 Vaccine Bivalent Booster 80yr & up 07/07/2021   Moderna Sars-Covid-2 Vaccination 10/26/2019, 11/23/2019, 07/27/2020, 01/09/2021   Pneumococcal Conjugate-13 05/26/2014, 07/20/2015   Pneumococcal Polysaccharide-23 09/23/1993, 08/14/2009, 04/23/2016   Pneumococcal-Unspecified 01/19/2004   Tdap 12/22/2008, 01/16/2009, 07/20/2015   Zoster Recombinat (Shingrix) 06/22/2018, 12/31/2019   Zoster, Live 11/23/2010    Past Medical History:  Diagnosis Date   Chronic kidney disease    renal insufficiency   Coronary artery disease    Diabetes mellitus    type 2-non-insulin   GERD (gastroesophageal reflux disease)    Gout    Hypertension    Obesity    Prostate cancer ([place]    Tobacco abuse counseling     Tobacco History: Social History   Tobacco Use  Smoking Status Former   Packs/day: 0.50   Years: 50.00   Total pack years: 25.00   Types: Cigarettes   Quit date: 01/30/2022   Years since quitting: 0.1  Smokeless Tobacco  Never  Tobacco Comments   3 cigs a day-05/02/2021   Counseling given: Not Answered Tobacco comments: 3 cigs a day-05/02/2021    Outpatient Encounter Medications as of 04/04/2022  Medication Sig   albuterol (VENTOLIN HFA) 108 (90 Base) MCG/ACT inhaler Inhale 2 puffs into the lungs every 4 (four) hours as needed for shortness of breath.   aspirin EC 81 MG tablet Take 81 mg by mouth daily.   docusate sodium (COLACE) 100 MG capsule Take 100 mg by mouth daily as needed for mild constipation.   Ensure (ENSURE) Take 237 mLs by mouth 3 (three) times daily between meals. Dialysis formula needed   isosorbide-hydrALAZINE (BIDIL) 20-37.5 MG tablet TAKE 1  TABLET BY MOUTH TWICE A DAY (APPROVED)   losartan (COZAAR) 50 MG tablet Take 25 mg by mouth daily.   omeprazole (PRILOSEC) 20 MG capsule Take 20 mg by mouth daily.   [DISCONTINUED] Tiotropium Bromide-Olodaterol (STIOLTO RESPIMAT) 2.5-2.5 MCG/ACT AERS Inhale 2 puffs into the lungs daily.   carvedilol (COREG) 3.125 MG tablet Take 1 tablet (3.125 mg total) by mouth 2 (two) times daily with a meal.   Tiotropium Bromide-Olodaterol (STIOLTO RESPIMAT) 2.5-2.5 MCG/ACT AERS Inhale 2 puffs into the lungs daily.   No facility-administered encounter medications on file as of 04/04/2022.     Review of Systems  Review of Systems  N/a Physical Exam  BP 126/64 (BP Location: Left Arm, Patient Position: Sitting, Cuff Size: Normal)   Pulse (!) 105   Temp 98.5 F (36.9 C) (Oral)   Ht '5\' 8"'$  (1.727 m)   Wt 176 lb 9.6 oz (80.1 kg)   SpO2 90%   BMI 26.85 kg/m   Wt Readings from Last 5 Encounters:  04/04/22 176 lb 9.6 oz (80.1 kg)  02/06/22 180 lb (81.6 kg)  06/25/21 180 lb 12.4 oz (82 kg)  05/13/21 180 lb (81.6 kg)  05/02/21 181 lb 6.4 oz (82.3 kg)    BMI Readings from Last 5 Encounters:  04/04/22 26.85 kg/m  02/06/22 28.19 kg/m  06/25/21 27.49 kg/m  05/13/21 27.37 kg/m  05/02/21 29.28 kg/m     Physical Exam General: Sitting in chair, no  acute distress Eyes: EOMI, no icterus Neck: Supple, JVP appreciated Pulmonary: Near absent breath sounds up to upper field on the right, clear on left, normal work of breathing Cardiovascular: Regular rate and rhythm, no murmur Abdomen: Nondistended, bowel sounds present MSK: No synovitis, joint effusion Neuro: Ambulates with cane, no weakness Psych: Normal mood, flat affect   Assessment & Plan:   Right pleural effusion: Demonstrated chest x-ray 09/2020 in the setting of ED visit diagnosed with pneumonia.  Sounds like repeat chest x-ray 02/[facility]21 [facility] with persistent effusion on right.  This prompted CT scan, images I cannot view, but report indicates fairly large right-sided pleural effusion with atelectasis of the right lower lobe, rounded infiltrate in the right middle lobe suspicious for atelectasis.  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  Other differential diagnoses include malignancy, persistent parapneumonic/inflammatory effusion.  Thoracentesis 04/2021 likely transudative given pleural protein to serum protein ratio on most recent labs to that was less than 50% and LDH less than two third of the limit of normal.  Was lymphocyte predominant.  Suspect this relates to chronicity.  Worsening DOE. CXR today. Tentative plan for thora next week if fluid present on CXR.  Dyspnea on exertion: Likely multifactorial due to mild volume overload, deconditioning, pleural effusion, at risk for obstructive lung disease given his history of cigarette smoking.  Do think deconditioning playing a major role.  Albuterol yields relief.  Stiolto helps some. Refilled today.   Return in about 3 months (around 07/05/2022).   Lanier Clam, MD 04/04/2022

## 2022-04-04 NOTE — Progress Notes (Signed)
$'@Patient'n$  ID: [name]., male    DOB: 1941/05/19, 81 y.o.   MRN: [id number]  Chief Complaint  Patient presents with   Follow-up    Pt states breathing has been bad for about 2 weeks now. Pt states he had dialysis today. Pt states he takes Stilito daily and Albuterol as needed. Pt states it does help.    Referring provider: [name], MD  HPI:   81 y.o. man whom we are seeing in follow up  for evaluation of dyspnea on exertion, right pleural effusion.   Doing ok.  Breathing Seems worse. Dialysis nurses saying it is bad. Using [employer]. Thinks it helps some.   HPI at initial visit: Patient is here for fluid around the lung.  Sent by his [facility] doctor.  Report of chest x-ray 02-2021 with right-sided pleural effusion.  CT chest obtained shortly thereafter report comments on large right-sided pleural effusion with description of atelectasis of the right lower lobe and likely atelectasis of part of the right middle lobe, no discrete mass or lymphadenopathy.  Reviewed most recent chest x-ray [facility] 09/2020 chest x-ray while in the ED notable for small right-sided pleural effusion with associated right-sided linear infiltrate, pneumonia versus atelectasis.  He was treated for pneumonia with doxycycline at that time.  No further follow-up imaging in our system.  Patient reports dyspnea on exertion for last several months.  Worse with inclines or stairs.  Present over flat surfaces as well.  No time of day where things are better or worse.  No position where things are better or worse.  Uses albuterol with mild relief at times.  No seasonal or environmental factors that make things better or worse.  No other alleviating or exacerbating factors.  Has dialysis Tuesday, Thursday, Saturday, unfortunately, he has come to clinic on Tuesday and miss his dialysis today.  Notes that they often cannot get him to the goal fluid removal.  Says this is been the case for many months to years.  He denies  any symptoms with dialysis such as shortness of breath, nausea, vomiting, hypotension that would preclude full ultrafiltrate.  Unclear what his dry weight is or what the target is.  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 / Pulmonary Flowsheets:   ACT:      No data to display           MMRC:     No data to display           Epworth:      No data to display           Tests:   FENO:  No results found for: "NITRICOXIDE"  PFT:     No data to display           WALK:      No data to display           Imaging: Personally reviewed as per EMR discussion this note  Lab Results: Personally reviewed CBC    Component Value Date/Time   WBC 7.0 11/27/2021 0408   RBC 3.61 (L) 11/27/2021 0408   HGB 11.7 (L) 11/27/2021 0408   HCT 34.9 (L) 11/27/2021 0408   PLT 108 (L) 11/27/2021 0408  MCV 96.7 11/27/2021 0408   MCH 32.4 11/27/2021 0408   MCHC 33.5 11/27/2021 0408   RDW 14.6 11/27/2021 0408   LYMPHSABS 0.9 11/26/2021 0910   MONOABS 0.7 11/26/2021 0910   EOSABS 0.3 11/26/2021 0910   BASOSABS 0.0 11/26/2021 0910    BMET    Component Value Date/Time   NA 129 (L) 11/27/2021 0408   K 4.9 11/27/2021 0408   CL 87 (L) 11/27/2021 0408   CO2 28 11/27/2021 0408   GLUCOSE 121 (H) 11/27/2021 0408   BUN 56 (H) 11/27/2021 0408   CREATININE 6.14 (H) 11/27/2021 0408   CREATININE 3.07 (HH) 07/03/2018 1515   CALCIUM 9.8 11/27/2021 0408   GFRNONAA 9 (L) 11/27/2021 0408   GFRNONAA 18 (L) 07/03/2018 1515   GFRAA 12 (L) 08/14/2019 1206   GFRAA 21 (L) 07/03/2018 1515    BNP    Component Value Date/Time   BNP >4,500.0 (H) 11/26/2021 0911    ProBNP    Component  Value Date/Time   PROBNP 45.0 11/20/2010 1609    Specialty Problems       Pulmonary Problems   Shortness of breath   Chronic obstructive lung disease (HCC)   COPD with acute exacerbation (HCC)   Pleural effusion on right    Allergies  Allergen Reactions   Latex Itching and Hives    Immunization History  Administered Date(s) Administered   Fluad Quad(high Dose 65+) 06/06/2020, 06/26/2021   Hepatitis A, Adult 03/09/2003   Hepatitis B, Dialysis 01/15/2020, 02/08/2020, 04/06/2020, 07/11/2020   Hepatitis B, adult 11/17/2002, 03/09/2003, 04/06/2020, 07/11/2020   Influenza, High Dose Seasonal PF 06/17/2014   Influenza, Seasonal, Injecte, Preservative Fre 11/23/2010, 07/15/2011, 10/05/2013   Influenza,inj,Quad PF,6+ Mos 07/16/2016, 01/14/2017   Influenza-Unspecified 07/01/1997, 06/23/2002, 07/13/2003, 07/26/2004, 09/30/2005, 07/08/2006, 06/24/2007, 06/23/2008, 06/23/2009, 05/24/2012, 06/23/2014, 06/22/2015, 08/20/2017, 06/08/2018, 06/24/2019   Moderna Covid-19 Vaccine Bivalent Booster 61yr & up 07/07/2021   Moderna Sars-Covid-2 Vaccination 10/26/2019, 11/23/2019, 07/27/2020, 01/09/2021   Pneumococcal Conjugate-13 05/26/2014, 07/20/2015   Pneumococcal Polysaccharide-23 09/23/1993, 08/14/2009, 04/23/2016   Pneumococcal-Unspecified 01/19/2004   Tdap 12/22/2008, 01/16/2009, 07/20/2015   Zoster Recombinat (Shingrix) 06/22/2018, 12/31/2019   Zoster, Live 11/23/2010    Past Medical History:  Diagnosis Date   Chronic kidney disease    renal insufficiency   Coronary artery disease    Diabetes mellitus    type 2-non-insulin   GERD (gastroesophageal reflux disease)    Gout    Hypertension    Obesity    [place]    Tobacco abuse counseling     Tobacco History: Social History   Tobacco Use  Smoking Status Former   Packs/day: 0.50   Years: 50.00   Total pack years: 25.00   Types: Cigarettes   Quit date: 01/30/2022   Years since quitting: 0.1  Smokeless Tobacco  Never  Tobacco Comments   3 cigs a day-05/02/2021   Counseling given: Not Answered Tobacco comments: 3 cigs a day-05/02/2021    Outpatient Encounter Medications as of 04/04/2022  Medication Sig   albuterol (VENTOLIN HFA) 108 (90 Base) MCG/ACT inhaler Inhale 2 puffs into the lungs every 4 (four) hours as needed for shortness of breath.   aspirin EC 81 MG tablet Take 81 mg by mouth daily.   docusate sodium (COLACE) 100 MG capsule Take 100 mg by mouth daily as needed for mild constipation.   Ensure (ENSURE) Take 237 mLs by mouth 3 (three) times daily between meals. Dialysis formula needed   isosorbide-hydrALAZINE (BIDIL) 20-37.5 MG tablet TAKE 1  TABLET BY MOUTH TWICE A DAY (APPROVED)   losartan (COZAAR) 50 MG tablet Take 25 mg by mouth daily.   omeprazole (PRILOSEC) 20 MG capsule Take 20 mg by mouth daily.   [DISCONTINUED] Tiotropium Bromide-Olodaterol (STIOLTO RESPIMAT) 2.5-2.5 MCG/ACT AERS Inhale 2 puffs into the lungs daily.   carvedilol (COREG) 3.125 MG tablet Take 1 tablet (3.125 mg total) by mouth 2 (two) times daily with a meal.   Tiotropium Bromide-Olodaterol (STIOLTO RESPIMAT) 2.5-2.5 MCG/ACT AERS Inhale 2 puffs into the lungs daily.   No facility-administered encounter medications on file as of 04/04/2022.     Review of Systems  Review of Systems  N/a Physical Exam  BP 126/64 (BP Location: Left Arm, Patient Position: Sitting, Cuff Size: Normal)   Pulse (!) 105   Temp 98.5 F (36.9 C) (Oral)   Ht '5\' 8"'$  (1.727 m)   Wt 176 lb 9.6 oz (80.1 kg)   SpO2 90%   BMI 26.85 kg/m   Wt Readings from Last 5 Encounters:  04/04/22 176 lb 9.6 oz (80.1 kg)  02/06/22 180 lb (81.6 kg)  06/25/21 180 lb 12.4 oz (82 kg)  05/13/21 180 lb (81.6 kg)  05/02/21 181 lb 6.4 oz (82.3 kg)    BMI Readings from Last 5 Encounters:  04/04/22 26.85 kg/m  02/06/22 28.19 kg/m  06/25/21 27.49 kg/m  05/13/21 27.37 kg/m  05/02/21 29.28 kg/m     Physical Exam General: Sitting in chair, no  acute distress Eyes: EOMI, no icterus Neck: Supple, JVP appreciated Pulmonary: Near absent breath sounds up to upper field on the right, clear on left, normal work of breathing Cardiovascular: Regular rate and rhythm, no murmur Abdomen: Nondistended, bowel sounds present MSK: No synovitis, joint effusion Neuro: Ambulates with cane, no weakness Psych: Normal mood, flat affect   Assessment & Plan:   Right pleural effusion: Demonstrated chest x-ray 09/2020 in the setting of ED visit diagnosed with pneumonia.  Sounds like repeat chest x-ray 02/[facility] with [facility] effusion on right.  This prompted CT scan, images I cannot view, but report indicates fairly large right-sided pleural effusion with atelectasis of the right lower lobe, rounded infiltrate in the right middle lobe suspicious for atelectasis.  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  Other differential diagnoses include malignancy, persistent parapneumonic/inflammatory effusion.  Thoracentesis 04/2021 likely transudative given pleural protein to serum protein ratio on most recent labs to that was less than 50% and LDH less than two third of the limit of normal.  Was lymphocyte predominant.  Suspect this relates to chronicity.  Worsening DOE. CXR today. Tentative plan for thora next week if fluid present on CXR.  Dyspnea on exertion: Likely multifactorial due to mild volume overload, deconditioning, pleural effusion, at risk for obstructive lung disease given his history of cigarette smoking.  Do think deconditioning playing a major role.  Albuterol yields relief.  Stiolto helps some. Refilled today.   Return in about 3 months (around 07/05/2022).   Lanier Clam, MD 04/04/2022

## 2022-04-08 ENCOUNTER — Telehealth: Payer: Self-pay | Admitting: Pulmonary Disease

## 2022-04-08 DIAGNOSIS — J90 Pleural effusion, not elsewhere classified: Secondary | ICD-10-CM | POA: Insufficient documentation

## 2022-04-08 NOTE — Telephone Encounter (Signed)
Yes - will be me. I placed a case request. Thank you!

## 2022-04-08 NOTE — Telephone Encounter (Signed)
Please arrange thoracentesis at [facility] the afternoon of 04/10/2022. Patient is aware of plan. Please contact him with details once arranged. Thanks!

## 2022-04-08 NOTE — Telephone Encounter (Signed)
I did get this scheduled for you for 7/19 at 2:00.  Unable to contact pt and have left vm for contact to have him to call me.

## 2022-04-08 NOTE — Telephone Encounter (Signed)
Dr Silas Flood - are you the provider that will be doing the thoracentesis?

## 2022-04-10 ENCOUNTER — Encounter (HOSPITAL_COMMUNITY): Payer: Self-pay | Admitting: Pulmonary Disease

## 2022-04-10 ENCOUNTER — Ambulatory Visit (HOSPITAL_COMMUNITY): Payer: No Typology Code available for payment source

## 2022-04-10 ENCOUNTER — Ambulatory Visit (HOSPITAL_COMMUNITY)
Admission: RE | Admit: 2022-04-10 | Discharge: 2022-04-10 | Disposition: A | Discharge status: Home / Self Care | Payer: No Typology Code available for payment source | Attending: Pulmonary Disease | Admitting: Pulmonary Disease

## 2022-04-10 ENCOUNTER — Encounter (HOSPITAL_COMMUNITY)
Admission: RE | Disposition: A | Discharge status: Home / Self Care | Payer: Self-pay | Source: Home / Self Care | Attending: Pulmonary Disease

## 2022-04-10 DIAGNOSIS — J90 Pleural effusion, not elsewhere classified: Secondary | ICD-10-CM | POA: Diagnosis present

## 2022-04-10 DIAGNOSIS — R0609 Other forms of dyspnea: Secondary | ICD-10-CM | POA: Diagnosis not present

## 2022-04-10 DIAGNOSIS — I129 Hypertensive chronic kidney disease with stage 1 through stage 4 chronic kidney disease, or unspecified chronic kidney disease: Secondary | ICD-10-CM | POA: Insufficient documentation

## 2022-04-10 DIAGNOSIS — J449 Chronic obstructive pulmonary disease, unspecified: Secondary | ICD-10-CM | POA: Insufficient documentation

## 2022-04-10 DIAGNOSIS — I517 Cardiomegaly: Secondary | ICD-10-CM | POA: Insufficient documentation

## 2022-04-10 DIAGNOSIS — Z992 Dependence on renal dialysis: Secondary | ICD-10-CM | POA: Insufficient documentation

## 2022-04-10 DIAGNOSIS — N186 End stage renal disease: Secondary | ICD-10-CM | POA: Insufficient documentation

## 2022-04-10 DIAGNOSIS — J9811 Atelectasis: Secondary | ICD-10-CM | POA: Insufficient documentation

## 2022-04-10 HISTORY — PX: THORACENTESIS: SHX235

## 2022-04-10 SURGERY — THORACENTESIS
Anesthesia: LOCAL | Laterality: Right

## 2022-04-10 NOTE — Brief Op Note (Signed)
04/10/2022  3:22 PM  PATIENT:  [name].  81 y.o. male  PRE-OPERATIVE DIAGNOSIS:  right pleural effusion and dyspnea  POST-OPERATIVE DIAGNOSIS:  as above  PROCEDURE:  Procedure(s): THORACENTESIS (Right)  SURGEON:  Surgeon(s) and Role:    * Kyngston Pickelsimer, Bonna Gains, MD - Primary  PHYSICIAN ASSISTANT:   ASSISTANTS: none   ANESTHESIA:   local  EBL:  none  LOCAL MEDICATIONS USED:  LIDOCAINE 1%  and Amount: 9 ml  SPECIMEN:  Source of Specimen:  pleural fluid from right for cytology  DISPOSITION OF SPECIMEN:  PATHOLOGY  PATIENT DISPOSITION:   home   Delay start of Pharmacological VTE agent (>24hrs) due to surgical blood loss or risk of bleeding: not applicable

## 2022-04-10 NOTE — Op Note (Signed)
Thoracentesis  Procedure Note  [name]  [id number]  June 24, 1941  Date:04/10/22  Time:3:23 PM   Provider Performing:Kaleem Sartwell R Selwyn Reason   Procedure: Thoracentesis with imaging guidance (37366)  Indication(s) Pleural Effusion  Consent Risks of the procedure as well as the alternatives and risks of each were explained to the patient and/or caregiver.  Consent for the procedure was obtained and is signed in the bedside chart  Anesthesia Topical only with 1% lidocaine    Time Out Verified patient identification, verified procedure, site/side was marked, verified correct patient position, special equipment/implants available, medications/allergies/relevant history reviewed, required imaging and test results available.   Sterile Technique Maximal sterile technique including full sterile barrier drape, hand hygiene, sterile gown, sterile gloves, mask, hair covering, sterile ultrasound probe cover (if used).  Procedure Description Ultrasound was used to identify appropriate pleural anatomy for placement and overlying skin marked.  Area of drainage cleaned and draped in sterile fashion. Lidocaine was used to anesthetize the skin and subcutaneous tissue.  1950 cc's of serous appearing fluid was drained from the right pleural space. Procedure terminated due to chest pain on right. Catheter then removed and bandaid applied to site.   Complications/Tolerance None; patient tolerated the procedure well. Chest X-ray is ordered to confirm no post-procedural complication.   EBL none   Specimen(s) Pleural fluid for cytology

## 2022-04-10 NOTE — Interval H&P Note (Signed)
History and Physical Interval Note:  04/10/2022 1:04 PM  [name].  has presented today for surgery, with the diagnosis of right pleural effusion and dyspnea.  The various methods of treatment have been discussed with the patient and family. After consideration of risks, benefits and other options for treatment, the patient has consented to  Procedure(s): THORACENTESIS (Right) as a surgical intervention.  The patient's history has been reviewed, patient examined, no change in status, stable for surgery.  I have reviewed the patient's chart and labs.  Questions were answered to the patient's satisfaction.     Bonna Gains Talyia Allende

## 2022-04-12 LAB — CYTOLOGY - NON PAP

## 2022-06-13 ENCOUNTER — Other Ambulatory Visit (HOSPITAL_COMMUNITY): Payer: Self-pay | Admitting: Internal Medicine

## 2022-06-13 DIAGNOSIS — N186 End stage renal disease: Secondary | ICD-10-CM

## 2022-06-22 ENCOUNTER — Emergency Department (HOSPITAL_COMMUNITY)
Admission: EM | Admit: 2022-06-22 | Discharge: 2022-06-22 | Discharge status: Against Medical Advice | Payer: No Typology Code available for payment source

## 2022-06-22 ENCOUNTER — Other Ambulatory Visit: Payer: Self-pay

## 2022-06-22 NOTE — ED Notes (Signed)
Pt called [facility] for triage. Pt could not be located.

## 2022-07-05 ENCOUNTER — Ambulatory Visit: Payer: No Typology Code available for payment source | Admitting: Pulmonary Disease

## 2022-07-08 ENCOUNTER — Other Ambulatory Visit: Payer: Self-pay | Admitting: *Deleted

## 2022-07-08 DIAGNOSIS — N186 End stage renal disease: Secondary | ICD-10-CM

## 2022-07-15 ENCOUNTER — Ambulatory Visit (HOSPITAL_COMMUNITY)
Admission: RE | Admit: 2022-07-15 | Discharge: 2022-07-15 | Disposition: A | Discharge status: Home / Self Care | Payer: No Typology Code available for payment source | Source: Ambulatory Visit | Attending: Surgery | Admitting: Surgery

## 2022-07-15 ENCOUNTER — Encounter: Payer: Self-pay | Admitting: Surgery

## 2022-07-15 ENCOUNTER — Ambulatory Visit (INDEPENDENT_AMBULATORY_CARE_PROVIDER_SITE_OTHER): Payer: No Typology Code available for payment source | Admitting: Surgery

## 2022-07-15 ENCOUNTER — Ambulatory Visit (INDEPENDENT_AMBULATORY_CARE_PROVIDER_SITE_OTHER)
Admission: RE | Admit: 2022-07-15 | Discharge: 2022-07-15 | Disposition: A | Discharge status: Home / Self Care | Payer: No Typology Code available for payment source | Source: Ambulatory Visit | Attending: Surgery | Admitting: Surgery

## 2022-07-15 VITALS — BP 102/75 | HR 83 | Temp 97.7°F | Resp 24 | Ht 68.0 in | Wt 176.0 lb

## 2022-07-15 DIAGNOSIS — N186 End stage renal disease: Secondary | ICD-10-CM

## 2022-07-15 DIAGNOSIS — Z992 Dependence on renal dialysis: Secondary | ICD-10-CM

## 2022-07-15 NOTE — Progress Notes (Signed)
[facility]  Patient name: [name]. MRN: [id number] DOB: [birth year] Sex: male   REQUESTING PROVIDER:    Dr. Melrose Nakayama   REASON FOR CONSULT:    Dialysis acces  HISTORY OF PRESENT ILLNESS:   [name]. is a 81 y.o. male, who is for dialysis access.  He has a history of a left forearm graft placed by Dr. Donnetta Hutching in 2020 which is now occluded.  He has a catheter in place.  He dialyzes on Tuesday Thursday Saturday.  Patient has a history of coronary artery disease.  He is a type II diabetic.  He is medically managed for hypertension.  He is a former smoker.  PAST MEDICAL HISTORY    Past Medical History:  Diagnosis Date   Chronic kidney disease    renal insufficiency   Coronary artery disease    Diabetes mellitus    type 2-non-insulin   GERD (gastroesophageal reflux disease)    Gout    Hypertension    Obesity    Prostate cancer ([place])    Tobacco abuse counseling      FAMILY HISTORY   Family History  Problem Relation Age of Onset   Aneurysm Mother    Multiple myeloma Mother    Aneurysm Father    Cancer Brother    Prostate cancer Brother    Cancer Other    Prostate cancer Brother    Bone cancer Brother    Breast cancer Neg Hx    Colon cancer Neg Hx    Pancreatic cancer Neg Hx     SOCIAL HISTORY:   Social History   Socioeconomic History   Marital status: Married    Spouse name: Not on file   Number of children: 3   Years of education: Not on file   Highest education level: Not on file  Occupational History   Occupation: retired  Tobacco Use   Smoking status: Former    Packs/day: 0.50    Years: 50.00    Total pack years: 25.00    Types: Cigarettes    Quit date: 01/30/2022    Years since quitting: 0.4   Smokeless tobacco: Never   Tobacco comments:    3 cigs a day-05/02/2021  Vaping Use   Vaping Use: Never used  Substance and Sexual Activity   Alcohol use: Not  Currently    Comment: drank 5 beers 03-28-17   Drug use: Not Currently    Types: Cocaine, Marijuana   Sexual activity: Yes  Other Topics Concern   Not on file  Social History Narrative   Not on file   Social Determinants of Health   Financial Resource Strain: Not on file  Food Insecurity: Not on file  Transportation Needs: Not on file  Physical Activity: Not on file  Stress: Not on file  Social Connections: Not on file  Intimate Partner Violence: Not on file    ALLERGIES:    No Known Allergies  CURRENT MEDICATIONS:    Current Outpatient Medications  Medication Sig Dispense Refill   albuterol (VENTOLIN HFA) 108 (90 Base) MCG/ACT inhaler Inhale 2 puffs into the lungs every 4 (four) hours as needed for shortness of breath. (Patient taking differently: Inhale 2 puffs into the lungs every 4 (four) hours as needed for shortness of breath or wheezing.) 18 g 5   amLODipine (NORVASC) 5 MG tablet Take 5 mg by mouth daily.     aspirin EC 81 MG tablet Take 81 mg  by mouth at bedtime.     atorvastatin (LIPITOR) 40 MG tablet Take 40 mg by mouth at bedtime.     DOCUSATE SODIUM PO Take 240 mg by mouth daily.     Ensure (ENSURE) Take 237 mLs by mouth daily. Dialysis formula needed     hydrALAZINE (APRESOLINE) 10 MG tablet Take 10 mg by mouth 2 (two) times daily.     losartan (COZAAR) 100 MG tablet Take 100 mg by mouth daily.     omeprazole (PRILOSEC) 20 MG capsule Take 20 mg by mouth daily.     terazosin (HYTRIN) 5 MG capsule Take 5 mg by mouth at bedtime.     Tiotropium Bromide-Olodaterol (STIOLTO RESPIMAT) 2.5-2.5 MCG/ACT AERS Inhale 2 puffs into the lungs daily. (Patient taking differently: Inhale 2 puffs into the lungs 2 (two) times daily.) 1 each 11   carvedilol (COREG) 3.125 MG tablet Take 1 tablet (3.125 mg total) by mouth 2 (two) times daily with a meal. (Patient not taking: Reported on 04/08/2022) 60 tablet 0   No current facility-administered medications for this visit.    REVIEW  OF SYSTEMS:   [X] denotes positive finding, [ ] denotes negative finding Cardiac  Comments:  Chest pain or chest pressure:    Shortness of breath upon exertion:    Short of breath when lying flat:    Irregular heart rhythm:        Vascular    Pain in calf, thigh, or hip brought on by ambulation:    Pain in feet at night that wakes you up from your sleep:     Blood clot in your veins:    Leg swelling:         Pulmonary    Oxygen at home:    Productive cough:     Wheezing:         Neurologic    Sudden weakness in arms or legs:     Sudden numbness in arms or legs:     Sudden onset of difficulty speaking or slurred speech:    Temporary loss of vision in one eye:     Problems with dizziness:         Gastrointestinal    Blood in stool:      Vomited blood:         Genitourinary    Burning when urinating:     Blood in urine:        Psychiatric    Major depression:         Hematologic    Bleeding problems:    Problems with blood clotting too easily:        Skin    Rashes or ulcers:        Constitutional    Fever or chills:     PHYSICAL EXAM:   Vitals:   07/15/22 1144  BP: 102/75  Pulse: 83  Resp: (!) 24  Temp: 97.7 F (36.5 C)  SpO2: 98%  Weight: 176 lb (79.8 kg)  Height: 5' 8" (1.727 m)    GENERAL: The patient is a well-nourished male, in no acute distress. The vital signs are documented above. CARDIAC: There is a regular rate and rhythm.  VASCULAR: Palpable left  brachial pulse and I evaluated the axillary vein with SonoSite and it is widely patent and easily compressible. PULMONARY: Nonlabored respirations MUSCULOSKELETAL: There are no major deformities or cyanosis. NEUROLOGIC: Left-sided weakness  SKIN: There are no ulcers or rashes noted. PSYCHIATRIC: The patient has a normal   affect.  STUDIES:   I have reviewed the following studies:   +-----------------+-------------+----------+--------------+  Right Cephalic   Diameter (cm)Depth (cm)    Findings     +-----------------+-------------+----------+--------------+  Shoulder             0.21                               +-----------------+-------------+----------+--------------+  Prox upper arm    0.21 / 0.16                           +-----------------+-------------+----------+--------------+  Mid upper arm                           not visualized  +-----------------+-------------+----------+--------------+  Dist upper arm                          not visualized  +-----------------+-------------+----------+--------------+  Antecubital fossa  0.21 / NV              branching     +-----------------+-------------+----------+--------------+  Prox forearm      0.16 / 0.14                           +-----------------+-------------+----------+--------------+  Mid forearm       0.09 / 0.20                           +-----------------+-------------+----------+--------------+  Dist forearm         0.15                    NWV        +-----------------+-------------+----------+--------------+   +-----------------+-------------+----------+----------------------------+  Right Basilic    Diameter (cm)Depth (cm)          Findings            +-----------------+-------------+----------+----------------------------+  Prox upper arm       0.31                          joins              +-----------------+-------------+----------+----------------------------+  Mid upper arm        0.20                                             +-----------------+-------------+----------+----------------------------+  Dist upper arm       0.21                                             +-----------------+-------------+----------+----------------------------+  Antecubital fossa                       not visualized and branching  +-----------------+-------------+----------+----------------------------+  Prox forearm                                    not visualized         +-----------------+-------------+----------+----------------------------+   +-----------------+-------------+----------+----------------------+  Left Cephalic    Diameter (cm)Depth (cm)       Findings         +-----------------+-------------+----------+----------------------+  Prox upper arm                              not visualized      +-----------------+-------------+----------+----------------------+  Mid upper arm                               not visualized      +-----------------+-------------+----------+----------------------+  Dist upper arm       0.14                                       +-----------------+-------------+----------+----------------------+  Antecubital fossa                       not visualized / graft  +-----------------+-------------+----------+----------------------+  Prox forearm                                  loop graft        +-----------------+-------------+----------+----------------------+  Mid forearm                                   loop graft        +-----------------+-------------+----------+----------------------+   +-----------------+-------------+----------+--------------+  Left Basilic     Diameter (cm)Depth (cm)   Findings     +-----------------+-------------+----------+--------------+  Prox upper arm                          not visualized  +-----------------+-------------+----------+--------------+  Mid upper arm                           not visualized  +-----------------+-------------+----------+--------------+  Dist upper arm                          not visualized  +-----------------+-------------+----------+--------------+  Antecubital fossa                       not visualized  +-----------------+-------------+----------+--------------+  ASSESSMENT and PLAN   End-stage renal disease: The patient has a failed left forearm graft.  His next  option would be a left upper arm graft.  I used the sono site to evaluate the axillary vein.  There are no stents up in this area.  I discussed proceeding with a left upper arm graft.  This will be done on a nondialysis day.  The risk and benefits of the operation were discussed with the patient.  All questions were answered.   Leia Alf, MD, FACS [facility] [phone] Pager [phone]

## 2022-07-16 ENCOUNTER — Other Ambulatory Visit: Payer: Self-pay

## 2022-07-16 DIAGNOSIS — N186 End stage renal disease: Secondary | ICD-10-CM

## 2022-07-30 ENCOUNTER — Encounter (HOSPITAL_COMMUNITY): Payer: Self-pay | Admitting: Anesthesiology

## 2022-07-30 ENCOUNTER — Telehealth: Payer: Self-pay

## 2022-07-30 NOTE — Progress Notes (Signed)
I called Mr. Welliver numerous times today, one of his phone voice mail is full, the other cannot be at this time.  I called patient's sister, she said he is probably out of minutes. I called Mr. Tome friend's phone and left a message asking her to have Mr. Danzy to call me. I continued to call patient , never could get him.

## 2022-07-30 NOTE — Telephone Encounter (Signed)
Levada Dy from [place] called to let us know when she saw pt at HD this morning he had forgotten he had surgery tomorrow. He stated he did not know time of arrival. I have tried to reach him several times on both numbers listed today, as well as left a VM with April who is his alternate contact.

## 2022-07-30 NOTE — Anesthesia Preprocedure Evaluation (Signed)
Anesthesia Evaluation    Reviewed: Allergy & Precautions, Patient's Chart, lab work & pertinent test results  History of Anesthesia Complications Negative for: history of anesthetic complications  Airway        Dental   Pulmonary COPD, former smoker          Cardiovascular hypertension, Pt. on medications + CAD, + Cardiac Stents and +CHF    Echo 2020: EF 30-35%, mod LVH, incoordinate septal motion, diffuse LV hypokinesis, mildly reduced RVSF, mod size vegetation on the tricuspid valve, mild MR    Neuro/Psych negative neurological ROS     GI/Hepatic ,GERD  ,,(+) Cirrhosis     substance abuse  alcohol use, Hepatitis -, C  Endo/Other  diabetes, Type 2    Renal/GU ESRF and DialysisRenal disease  negative genitourinary   Musculoskeletal negative musculoskeletal ROS (+)    Abdominal   Peds  Hematology negative hematology ROS (+)   Anesthesia Other Findings   Reproductive/Obstetrics                             Anesthesia Physical Anesthesia Plan  ASA: 4  Anesthesia Plan:    Post-op Pain Management:    Induction:   PONV Risk Score and Plan:   Airway Management Planned:   Additional Equipment:   Intra-op Plan:   Post-operative Plan:   Informed Consent:   Plan Discussed with:   Anesthesia Plan Comments: (Case cancelled due to acute dyspnea and hypoxia with h/o systolic HF as well as elevated potassium on ISTAT. Rapid response called to allow patient to be admitted for further care.)        Anesthesia Quick Evaluation

## 2022-07-31 ENCOUNTER — Ambulatory Visit (HOSPITAL_COMMUNITY): Payer: No Typology Code available for payment source

## 2022-07-31 ENCOUNTER — Encounter (HOSPITAL_COMMUNITY): Payer: Self-pay | Admitting: Surgery

## 2022-07-31 ENCOUNTER — Ambulatory Visit (HOSPITAL_COMMUNITY)
Admission: RE | Admit: 2022-07-31 | Discharge: 2022-07-31 | Disposition: A | Discharge status: Home / Self Care | Payer: No Typology Code available for payment source | Attending: Emergency Medicine | Admitting: Emergency Medicine

## 2022-07-31 ENCOUNTER — Encounter (HOSPITAL_COMMUNITY)
Admission: RE | Disposition: A | Discharge status: Home / Self Care | Payer: Self-pay | Source: Home / Self Care | Attending: Emergency Medicine

## 2022-07-31 DIAGNOSIS — Z79899 Other long term (current) drug therapy: Secondary | ICD-10-CM | POA: Insufficient documentation

## 2022-07-31 DIAGNOSIS — Z87891 Personal history of nicotine dependence: Secondary | ICD-10-CM | POA: Insufficient documentation

## 2022-07-31 DIAGNOSIS — I2510 Atherosclerotic heart disease of native coronary artery without angina pectoris: Secondary | ICD-10-CM | POA: Diagnosis not present

## 2022-07-31 DIAGNOSIS — I4891 Unspecified atrial fibrillation: Secondary | ICD-10-CM | POA: Insufficient documentation

## 2022-07-31 DIAGNOSIS — J9811 Atelectasis: Secondary | ICD-10-CM | POA: Insufficient documentation

## 2022-07-31 DIAGNOSIS — I252 Old myocardial infarction: Secondary | ICD-10-CM | POA: Diagnosis not present

## 2022-07-31 DIAGNOSIS — I1311 Hypertensive heart and chronic kidney disease without heart failure, with stage 5 chronic kidney disease, or end stage renal disease: Secondary | ICD-10-CM | POA: Diagnosis not present

## 2022-07-31 DIAGNOSIS — J90 Pleural effusion, not elsewhere classified: Secondary | ICD-10-CM | POA: Diagnosis present

## 2022-07-31 DIAGNOSIS — Z8546 Personal history of malignant neoplasm of prostate: Secondary | ICD-10-CM | POA: Diagnosis not present

## 2022-07-31 DIAGNOSIS — J449 Chronic obstructive pulmonary disease, unspecified: Secondary | ICD-10-CM | POA: Diagnosis not present

## 2022-07-31 DIAGNOSIS — I445 Left posterior fascicular block: Secondary | ICD-10-CM | POA: Insufficient documentation

## 2022-07-31 DIAGNOSIS — Z992 Dependence on renal dialysis: Secondary | ICD-10-CM | POA: Insufficient documentation

## 2022-07-31 DIAGNOSIS — I4510 Unspecified right bundle-branch block: Secondary | ICD-10-CM | POA: Insufficient documentation

## 2022-07-31 DIAGNOSIS — N186 End stage renal disease: Secondary | ICD-10-CM | POA: Diagnosis not present

## 2022-07-31 DIAGNOSIS — Z1152 Encounter for screening for COVID-19: Secondary | ICD-10-CM | POA: Diagnosis not present

## 2022-07-31 DIAGNOSIS — K219 Gastro-esophageal reflux disease without esophagitis: Secondary | ICD-10-CM | POA: Insufficient documentation

## 2022-07-31 DIAGNOSIS — E1122 Type 2 diabetes mellitus with diabetic chronic kidney disease: Secondary | ICD-10-CM | POA: Diagnosis not present

## 2022-07-31 LAB — BASIC METABOLIC PANEL
Anion gap: 15 (ref 5–15)
BUN: 28 mg/dL — ABNORMAL HIGH (ref 8–23)
CO2: 29 mmol/L (ref 22–32)
Calcium: 8.6 mg/dL — ABNORMAL LOW (ref 8.9–10.3)
Chloride: 91 mmol/L — ABNORMAL LOW (ref 98–111)
Creatinine, Ser: 3.67 mg/dL — ABNORMAL HIGH (ref 0.61–1.24)
GFR, Estimated: 16 mL/min — ABNORMAL LOW (ref >60)
Glucose, Bld: 97 mg/dL (ref 70–99)
Potassium: 3.5 mmol/L (ref 3.5–5.1)
Sodium: 135 mmol/L (ref 135–145)

## 2022-07-31 LAB — POCT I-STAT, CHEM 8
BUN: 44 mg/dL — ABNORMAL HIGH (ref 8–23)
Calcium, Ion: 0.75 mmol/L — CL (ref 1.15–1.40)
Chloride: 96 mmol/L — ABNORMAL LOW (ref 98–111)
Creatinine, Ser: 3.9 mg/dL — ABNORMAL HIGH (ref 0.61–1.24)
Glucose, Bld: 92 mg/dL (ref 70–99)
HCT: 40 % (ref 39.0–52.0)
Hemoglobin: 13.6 g/dL (ref 13.0–17.0)
Potassium: 6.5 mmol/L (ref 3.5–5.1)
Sodium: 131 mmol/L — ABNORMAL LOW (ref 135–145)
TCO2: 31 mmol/L (ref 22–32)

## 2022-07-31 LAB — CBC
HCT: 38.4 % — ABNORMAL LOW (ref 39.0–52.0)
Hemoglobin: 12.8 g/dL — ABNORMAL LOW (ref 13.0–17.0)
MCH: 33.8 pg (ref 26.0–34.0)
MCHC: 33.3 g/dL (ref 30.0–36.0)
MCV: 101.3 fL — ABNORMAL HIGH (ref 80.0–100.0)
Platelets: 143 10*3/uL — ABNORMAL LOW (ref 150–400)
RBC: 3.79 MIL/uL — ABNORMAL LOW (ref 4.22–5.81)
RDW: 14.7 % (ref 11.5–15.5)
WBC: 8.3 10*3/uL (ref 4.0–10.5)
nRBC: 0 % (ref 0.0–0.2)

## 2022-07-31 LAB — RESP PANEL BY RT-PCR (FLU A&B, COVID) ARPGX2
Influenza A by PCR: NEGATIVE
Influenza B by PCR: NEGATIVE
SARS Coronavirus 2 by RT PCR: NEGATIVE

## 2022-07-31 LAB — BODY FLUID CELL COUNT WITH DIFFERENTIAL
Eos, Fluid: 0 %
Lymphs, Fluid: 87 %
Monocyte-Macrophage-Serous Fluid: 10 % — ABNORMAL LOW (ref 50–90)
Neutrophil Count, Fluid: 2 % (ref 0–25)
Total Nucleated Cell Count, Fluid: 223 cu mm (ref 0–1000)

## 2022-07-31 LAB — GLUCOSE, CAPILLARY: Glucose-Capillary: 90 mg/dL (ref 70–99)

## 2022-07-31 LAB — PROTEIN, PLEURAL OR PERITONEAL FLUID: Total protein, fluid: 3.9 g/dL

## 2022-07-31 LAB — HEPATITIS B SURFACE ANTIGEN: Hepatitis B Surface Ag: NONREACTIVE

## 2022-07-31 LAB — LACTATE DEHYDROGENASE, PLEURAL OR PERITONEAL FLUID: LD, Fluid: 136 U/L — ABNORMAL HIGH (ref 3–23)

## 2022-07-31 LAB — BRAIN NATRIURETIC PEPTIDE: B Natriuretic Peptide: >4500 pg/mL — ABNORMAL HIGH (ref 0.0–100.0)

## 2022-07-31 LAB — MAGNESIUM: Magnesium: 2 mg/dL (ref 1.7–2.4)

## 2022-07-31 SURGERY — INSERTION OF ARTERIOVENOUS (AV) GORE-TEX GRAFT ARM
Anesthesia: Choice | Laterality: Left

## 2022-07-31 MED ORDER — CEFAZOLIN SODIUM-DEXTROSE 2-4 GM/100ML-% IV SOLN: 2.0000 g | INTRAVENOUS | Status: DC

## 2022-07-31 MED ORDER — LIDOCAINE HCL (PF) 1 % IJ SOLN: 5.0000 mL | INTRAMUSCULAR | Status: DC | PRN

## 2022-07-31 MED ORDER — IPRATROPIUM-ALBUTEROL 0.5-2.5 (3) MG/3ML IN SOLN
3.0000 mL | Freq: Once | RESPIRATORY_TRACT | Status: AC
  Administered 2022-07-31: 3 mL via RESPIRATORY_TRACT
  Filled 2022-07-31: qty 3

## 2022-07-31 MED ORDER — CHLORHEXIDINE GLUCONATE 0.12 % MT SOLN: 15.0000 mL | Freq: Once | OROMUCOSAL | Status: DC

## 2022-07-31 MED ORDER — HEPARIN SODIUM (PORCINE) 1000 UNIT/ML DIALYSIS
1000.0000 [IU] | INTRAMUSCULAR | Status: DC | PRN
  Administered 2022-07-31: 1000 [IU]

## 2022-07-31 MED ORDER — LACTATED RINGERS IV SOLN: INTRAVENOUS | Status: DC

## 2022-07-31 MED ORDER — PENTAFLUOROPROP-TETRAFLUOROETH EX AERO: 1.0000 | INHALATION_SPRAY | CUTANEOUS | Status: DC | PRN

## 2022-07-31 MED ORDER — CHLORHEXIDINE GLUCONATE CLOTH 2 % EX PADS: 6.0000 | MEDICATED_PAD | Freq: Every day | CUTANEOUS | Status: DC

## 2022-07-31 MED ORDER — SODIUM CHLORIDE 0.9 % IV SOLN: INTRAVENOUS | Status: DC

## 2022-07-31 MED ORDER — CHLORHEXIDINE GLUCONATE 4 % EX LIQD: 60.0000 mL | Freq: Once | CUTANEOUS | Status: DC

## 2022-07-31 MED ORDER — ANTICOAGULANT SODIUM CITRATE 4% (200MG/5ML) IV SOLN: 5.0000 mL | Status: DC | PRN

## 2022-07-31 MED ORDER — HEPARIN SODIUM (PORCINE) 1000 UNIT/ML IJ SOLN
INTRAMUSCULAR | Status: AC
  Filled 2022-07-31: qty 3

## 2022-07-31 MED ORDER — ALBUTEROL SULFATE (2.5 MG/3ML) 0.083% IN NEBU: 2.5000 mg | INHALATION_SOLUTION | Freq: Once | RESPIRATORY_TRACT | Status: DC

## 2022-07-31 MED ORDER — ORAL CARE MOUTH RINSE: 15.0000 mL | Freq: Once | OROMUCOSAL | Status: DC

## 2022-07-31 MED ORDER — LIDOCAINE-PRILOCAINE 2.5-2.5 % EX CREA: 1.0000 | TOPICAL_CREAM | CUTANEOUS | Status: DC | PRN

## 2022-07-31 MED ORDER — ALTEPLASE 2 MG IJ SOLR: 2.0000 mg | Freq: Once | INTRAMUSCULAR | Status: DC | PRN

## 2022-07-31 NOTE — ED Notes (Signed)
Pt tolerated thoracentesis well. Cardiac and VS monitored throughout.

## 2022-07-31 NOTE — ED Provider Notes (Signed)
Patient was seen in the ED earlier with shortness of breath.  Had a pleural effusion that was drained by critical care and had ongoing tachypnea.  Was evaluated by nephrology.  Had emergent dialysis performed.  Patient has now returned from dialysis.  He tells me he is feeling better and would like to try and go home.  I encouraged him to return anytime things worsens.  Encouraged him to follow-up with his normal dialysis center tomorrow.   Deno Etienne, DO 07/31/22 2223

## 2022-07-31 NOTE — ED Provider Notes (Signed)
[facility] EMERGENCY DEPARTMENT Provider Note   CSN: [id number] Arrival date & time: 07/31/22  7106     History  Chief Complaint  Patient presents with   Shortness of Breath    [name]. is a 81 y.o. male.  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  Pt has been more sob for several days.  He went to dialysis yesterday and it did not help much.  He had a full session.  He was scheduled in short stay today with for vascular to place a graft for dialysis.  When pt arrived, he was sob.  He required 6L oxygen to get O2 sats above 90%.  Pt denies f/c.  No cough.  Upon EPIC review, he's had a right sided pleural effusion in the past which was last drained by pulmonology in July.  Rapid response called by short stay staff and pt was brought down here for further eval.       Home Medications Prior to Admission medications   Medication Sig Start Date End Date Taking? Authorizing Provider  albuterol (VENTOLIN HFA) 108 (90 Base) MCG/ACT inhaler Inhale 2 puffs into the lungs every 4 (four) hours as needed for shortness of breath. Patient taking differently: Inhale 2 puffs into the lungs 2 (two) times daily as needed for shortness of breath or wheezing. 02/06/22  Yes Hunsucker, Bonna Gains, MD  ASPIRIN PO Take 0.5 tablets by mouth daily.   Yes [provider]  Ensure (ENSURE) Take 237 mLs by mouth daily. Dialysis formula needed   Yes [provider]  Tiotropium Bromide-Olodaterol (STIOLTO RESPIMAT) 2.5-2.5 MCG/ACT AERS Inhale 2 puffs into the lungs daily. Patient taking differently: Inhale 2 puffs into the lungs 2 (two) times daily. 04/04/22  Yes Hunsucker, Bonna Gains, MD  amLODipine (NORVASC) 5 MG tablet Take 5 mg by mouth daily. Patient not taking: Reported on 07/31/2022    [provider]  atorvastatin (LIPITOR) 40 MG tablet Take 40 mg by mouth at  bedtime. Patient not taking: Reported on 07/31/2022    [provider]  carvedilol (COREG) 3.125 MG tablet Take 1 tablet (3.125 mg total) by mouth 2 (two) times daily with a meal. Patient not taking: Reported on 04/08/2022 11/27/21 12/27/21  Darliss Cheney, MD  cinacalcet (SENSIPAR) 30 MG tablet Take 30 mg by mouth daily. 04/09/22   [provider]  DOCUSATE SODIUM PO Take 240 mg by mouth daily. Patient not taking: Reported on 07/31/2022    [provider]  hydrALAZINE (APRESOLINE) 10 MG tablet Take 10 mg by mouth 2 (two) times daily. Patient not taking: Reported on 07/31/2022    [provider]  isosorbide-hydrALAZINE (BIDIL) 20-37.5 MG tablet TAKE 1 TABLET BY MOUTH TWICE A DAY (APPROVED) Patient not taking: Reported on 07/31/2022 11/29/21   [provider]  losartan (COZAAR) 100 MG tablet Take 100 mg by mouth daily. Patient not taking: Reported on 07/31/2022    [provider]  metoprolol succinate (TOPROL-XL) 25 MG 24 hr tablet Take 12.5 mg by mouth daily. 06/18/22   [provider]  omeprazole (PRILOSEC) 20 MG capsule Take 20 mg by mouth daily. Patient not taking: Reported on 07/31/2022    [provider]  terazosin (HYTRIN) 5 MG capsule Take 5 mg by mouth at bedtime. Patient not taking: Reported on 07/31/2022    [provider]  Allergies    Patient has no known allergies.    Review of Systems   Review of Systems  Respiratory:  Positive for shortness of breath.   All other systems reviewed and are negative.   Physical Exam Updated Vital Signs BP 104/81   Pulse 89   Temp 97.9 F (36.6 C) (Oral)   Resp (!) 23   Ht '5\' 8"'$  (1.727 m)   Wt 66.7 kg   SpO2 98%   BMI 22.35 kg/m  Physical Exam Vitals and nursing note reviewed.  Constitutional:      Appearance: He is well-developed. He is ill-appearing.  HENT:     Head: Normocephalic and atraumatic.     Mouth/Throat:     Mouth: Mucous membranes are moist.      Pharynx: Oropharynx is clear.  Eyes:     Extraocular Movements: Extraocular movements intact.     Pupils: Pupils are equal, round, and reactive to light.  Cardiovascular:     Rate and Rhythm: Normal rate and regular rhythm.  Pulmonary:     Effort: Tachypnea and accessory muscle usage present.     Breath sounds: Decreased breath sounds present.  Chest:     Comments: Dialysis cath right anterior chest Abdominal:     General: Bowel sounds are normal.     Palpations: Abdomen is soft.  Musculoskeletal:        General: Normal range of motion.     Cervical back: Normal range of motion and neck supple.     Right lower leg: Edema present.     Left lower leg: Edema present.  Skin:    General: Skin is warm.     Capillary Refill: Capillary refill takes less than 2 seconds.  Neurological:     General: No focal deficit present.     Mental Status: He is alert and oriented to person, place, and time.  Psychiatric:        Mood and Affect: Mood normal.        Behavior: Behavior normal.     ED Results / Procedures / Treatments   Labs (all labs ordered are listed, but only abnormal results are displayed) Labs Reviewed  BRAIN NATRIURETIC PEPTIDE - Abnormal; Notable for the following components:      Result Value   B Natriuretic Peptide >4,500.0 (*)    All other components within normal limits  BASIC METABOLIC PANEL - Abnormal; Notable for the following components:   Chloride 91 (*)    BUN 28 (*)    Creatinine, Ser 3.67 (*)    Calcium 8.6 (*)    GFR, Estimated 16 (*)    All other components within normal limits  CBC - Abnormal; Notable for the following components:   RBC 3.79 (*)    Hemoglobin 12.8 (*)    HCT 38.4 (*)    MCV 101.3 (*)    Platelets 143 (*)    All other components within normal limits  LACTATE DEHYDROGENASE, PLEURAL OR PERITONEAL FLUID - Abnormal; Notable for the following components:   LD, Fluid 136 (*)    All other components within normal limits  BODY FLUID CELL  COUNT WITH DIFFERENTIAL - Abnormal; Notable for the following components:   Color, Fluid AMBER (*)    Appearance, Fluid CLOUDY (*)    Monocyte-Macrophage-Serous Fluid 10 (*)    All other components within normal limits  POCT I-STAT, CHEM 8 - Abnormal; Notable for the following components:   Sodium 131 (*)    Potassium  6.5 (*)    Chloride 96 (*)    BUN 44 (*)    Creatinine, Ser 3.90 (*)    Calcium, Ion 0.75 (*)    All other components within normal limits  RESP PANEL BY RT-PCR (FLU A&B, COVID) ARPGX2  BODY FLUID CULTURE W GRAM STAIN  GLUCOSE, CAPILLARY  MAGNESIUM  PROTEIN, PLEURAL OR PERITONEAL FLUID  PATHOLOGIST SMEAR REVIEW    EKG None  Radiology DG CHEST PORT 1 VIEW  Result Date: 07/31/2022 CLINICAL DATA:  81 year old male status post thoracentesis. EXAM: PORTABLE CHEST 1 VIEW COMPARISON:  Portable chest 0934 hours today and earlier. FINDINGS: Portable AP semi upright view at 1115 hours. Mildly rotated to the right now. Layering right pleural effusion appears mildly decreased. No pneumothorax. Cardiomegaly and other mediastinal contours appear stable. No new pulmonary opacity, left lung appears negative. Stable right chest dual lumen dialysis type catheter. Paucity bowel gas in the upper abdomen. IMPRESSION: Decreased right pleural effusion following thoracentesis with no pneumothorax. Electronically Signed   By: Genevie Ann M.D.   On: 07/31/2022 11:27   DG Chest Port 1 View  Result Date: 07/31/2022 CLINICAL DATA:  Acute respiratory distress. Cough and shortness of breath. EXAM: PORTABLE CHEST 1 VIEW COMPARISON:  Chest x-ray dated April 10, 2022. FINDINGS: New tunneled right internal jugular dialysis catheter with tip in the distal SVC. Unchanged moderate cardiomegaly. Mild pulmonary vascular congestion. Moderate right pleural effusion with right middle and lower lobe atelectasis. No pneumothorax. No acute osseous abnormality. IMPRESSION: 1. Moderate right pleural effusion with right  middle and lower lobe atelectasis. Electronically Signed   By: Titus Dubin M.D.   On: 07/31/2022 09:43    Procedures Procedures    Medications Ordered in ED Medications  albuterol (PROVENTIL) (2.5 MG/3ML) 0.083% nebulizer solution 2.5 mg (has no administration in time range)  ipratropium-albuterol (DUONEB) 0.5-2.5 (3) MG/3ML nebulizer solution 3 mL (3 mLs Nebulization Given 07/31/22 1252)    ED Course/ Medical Decision Making/ A&P                           Medical Decision Making Risk Prescription drug management.   This patient presents to the ED for concern of sob, this involves an extensive number of treatment options, and is a complaint that carries with it a high risk of complications and morbidity.  The differential diagnosis includes pna, copd exac, covid/flu, chf, pleural effusion   Co morbidities that complicate the patient evaluation  ESRD on HD (Tu, Th, Sat), COPD, HTN, CAD, DM2, tobacco abuse and prostate cancer   Additional history obtained:  Additional history obtained from epic chart review External records from outside source obtained and reviewed including rapid response staff   Lab Tests:  I Ordered, and personally interpreted labs.  The pertinent results include:  istat k 6.5 (however lab bmp with nl k at 3.5); bun 28 and cr 3.67; mg 2.0; cbc with wbc nl at 8.3 and hgb stable at 12.8; covid/flu neg   Imaging Studies ordered:  I ordered imaging studies including cxr  I independently visualized and interpreted imaging which showed  IMPRESSION: 1. Moderate right pleural effusion with right middle and lower lobe atelectasis. CXR after procedure:  IMPRESSION: Decreased right pleural effusion following thoracentesis with no pneumothorax. I agree with the radiologist interpretation   Cardiac Monitoring:  The patient was maintained on a cardiac monitor.  I personally viewed and interpreted the cardiac monitored which showed an underlying rhythm of:  nsr   Medicines ordered and prescription drug management:  I ordered medication including duoneb  for copd  Reevaluation of the patient after these medicines showed that the patient improved I have reviewed the patients home medicines and have made adjustments as needed   Critical Interventions:  Thoracentesis   Consultations Obtained:  I requested consultation with the pulmonologist (Dr. Silas Flood),  and discussed lab and imaging findings as well as pertinent plan - he performed a thoracentesis in the ED and removed 1600 cc fluid  Problem List / ED Course:  SOB: this has improved after thoracentesis and pt is no longer requiring oxygen when he is sitting in bed.  However, pt's O2 sat dropped to 87% with ambulation.  He said he did feel sob.  Pt does look volume overloaded, so I spoke with nephrology to see if he get in another dialysis session.  Dr. Augustin Coupe will try to get him to dialysis today.  I think pt can go home after dialysis if he feels better.    Reevaluation:  After the interventions noted above, I reevaluated the patient and found that they have :improved   Social Determinants of Health:  Lives at home   Dispostion:  Pending after dialysis today.        Final Clinical Impression(s) / ED Diagnoses Final diagnoses:  Pleural effusion on right  ESRD on hemodialysis [facility])    Rx / DC Orders ED Discharge Orders     None         Isla Pence, MD 07/31/22 1510

## 2022-07-31 NOTE — Progress Notes (Signed)
   07/31/22 2200  Vitals  Temp 98.1 F (36.7 C)  BP 99/69  Pulse Rate 90  Resp 18  Post Treatment  Dialyzer Clearance Heavily streaked  Duration of HD Treatment -hour(s) 3 hour(s)  Liters Processed 68.3  Fluid Removed (mL) 2 mL  Tolerated HD Treatment Yes   TX fin. W/o difficulty.

## 2022-07-31 NOTE — Consult Note (Signed)
NAME:  [name], [name] MRN:  [id number], DOB:  Jan 26, 1941, LOS: 0 ADMISSION DATE:  07/31/2022, CONSULTATION DATE: 03/30/2022 REFERRING MD: Teaching service, CHIEF COMPLAINT: Respiratory failure recurrent right pleural effusion  History of Present Illness:  81 year old male with a history of right pleural effusions with a thoracentesis performed in July with 2 L drained.  He presents now with increasing shortness of breath requiring oxygen with radiographic evidence of right greater than left pleural effusion.  He is an end-stage renal disease patient undergoes hemodialysis.  We will perform a right thoracentesis today drainage possible suggested dialysis increased to negative pull.  He is in no acute distress at this time.  He is a smoker 1/2 pack a day.  He has had increasing lower extremity edema, he is positive for JVD on examination, lower extremity edema is present.  Pertinent  Medical History   Past Medical History:  Diagnosis Date   Chronic kidney disease    renal insufficiency   Coronary artery disease    Diabetes mellitus    type 2-non-insulin   GERD (gastroesophageal reflux disease)    Gout    Hypertension    Obesity    Prostate cancer ([place])    Tobacco abuse counseling      Significant Hospital Events: Including procedures, antibiotic start and stop dates in addition to other pertinent events   Right thoracentesis with  Interim History / Subjective:  Increasing shortness of breath with increasing right pleural effusion  Objective   Blood pressure 100/83, pulse 98, temperature 97.9 F (36.6 C), temperature source Oral, resp. rate 17, height 5' 8" (1.727 m), weight 66.7 kg, SpO2 99 %.       No intake or output data in the 24 hours ending 07/31/22 1041 Filed Weights   07/31/22 0809  Weight: 66.7 kg    Examination: General: Elderly male in no acute distress at rest HENT: Positive JVD is appreciated Lungs: Decreased breath sounds right greater than left  base Cardiovascular: Heart sounds are regular Abdomen: Soft nontender Extremities: Lower extremity edema is appreciated Neuro: Grossly intact without focal defect  Resolved Hospital Problem list     Assessment & Plan:  Acute on chronic respiratory failure in the setting of volume overload and increasing right greater than left pleural effusion likely secondary to ineffective hemodialysis.  He was tapped on the right for 2 L back in April 08, 2022 by Dr. Silas Flood.  He presents with increasing shortness of breath now requiring oxygen to maintain adequate saturations. Right thoracentesis was attempted drainage Further radiographic evaluation is needed Consider running negative on hemodialysis Repeat chest x-ray after right thoracentesis Follow-up with pulmonary as an outpatient  End-stage renal disease Right IJ Vas-Cath in place Per nephrology  All other issues per primary    Best Practice (right click and "Reselect all SmartList Selections" daily)   Diet/type: Regular consistency (see orders) DVT prophylaxis: prophylactic heparin  GI prophylaxis: N/A Lines: N/A Foley:  Yes, and it is still needed Code Status:  full code Last date of multidisciplinary goals of care discussion [tbd]  Labs   CBC: Recent Labs  Lab 07/31/22 0852 07/31/22 0923  WBC  --  8.3  HGB 13.6 12.8*  HCT 40.0 38.4*  MCV  --  101.3*  PLT  --  143*    Basic Metabolic Panel: Recent Labs  Lab 07/31/22 0852 07/31/22 0923  NA 131* 135  K 6.5* 3.5  CL 96* 91*  CO2  --  29  GLUCOSE  92 97  BUN 44* 28*  CREATININE 3.90* 3.67*  CALCIUM  --  8.6*  MG  --  2.0   GFR: Estimated Creatinine Clearance: 15.1 mL/min (A) (by C-G formula based on SCr of 3.67 mg/dL (H)). Recent Labs  Lab 07/31/22 0923  WBC 8.3    Liver Function Tests: No results for input(s): "AST", "ALT", "ALKPHOS", "BILITOT", "PROT", "ALBUMIN" in the last 168 hours. No results for input(s): "LIPASE", "AMYLASE" in the last 168  hours. No results for input(s): "AMMONIA" in the last 168 hours.  ABG    Component Value Date/Time   HCO3 36.7 (H) 11/26/2021 0908   TCO2 31 07/31/2022 0852   O2SAT 76 11/26/2021 0908     Coagulation Profile: No results for input(s): "INR", "PROTIME" in the last 168 hours.  Cardiac Enzymes: No results for input(s): "CKTOTAL", "CKMB", "CKMBINDEX", "TROPONINI" in the last 168 hours.  HbA1C: Hgb A1c MFr Bld  Date/Time Value Ref Range Status  11/27/2021 04:08 AM 5.6 4.8 - 5.6 % Final    Comment:    (NOTE) Pre diabetes:          5.7%-6.4%  Diabetes:              >6.4%  Glycemic control for   <7.0% adults with diabetes   04/22/2019 06:15 AM 5.6 4.8 - 5.6 % Final    Comment:    (NOTE) Pre diabetes:          5.7%-6.4% Diabetes:              >6.4% Glycemic control for   <7.0% adults with diabetes     CBG: Recent Labs  Lab 07/31/22 0808  GLUCAP 90    Review of Systems:   10 point review of system taken, please see HPI for positives and negatives. Positive for increasing shortness of breath Positive for orthopnea Denies fever chills or sweats Positive lower extremity edema  He,  has a past medical history of Chronic kidney disease, Coronary artery disease, Diabetes mellitus, GERD (gastroesophageal reflux disease), Gout, Hypertension, Obesity, Prostate cancer ([place]), and Tobacco abuse counseling.   Surgical History:   Past Surgical History:  Procedure Laterality Date   AV FISTULA PLACEMENT Left 04/26/2019   Procedure: INSERTION OF ARTERIOVENOUS (AV) GORE-TEX GRAFT ARM - using [facility] Graft size 45cm;  Surgeon: Rosetta Posner, MD;  Location: [place];  Service: Vascular;  Laterality: Left;   CARDIAC CATHETERIZATION  02/08/02   CARDIAC CATHETERIZATION  11/20/10   drug eluting stent;mid left LAD, There is mild hypokinesis of the anterolateral wall. LVEF  is estimated at 50-55%   INSERTION OF BRONCHIAL STENT     went in groin to R chest   INSERTION OF DIALYSIS  CATHETER Right 04/26/2019   Procedure: INSERTION OF HEMODIALYSIS CATHETER - using Palindrome Catheter size 23cm;  Surgeon: Rosetta Posner, MD;  Location: [place];  Service: Vascular;  Laterality: Right;   LEFT HEART CATH AND CORONARY ANGIOGRAPHY N/A 04/23/2019   Procedure: LEFT HEART CATH AND CORONARY ANGIOGRAPHY;  Surgeon: Sherren Mocha, MD;  Location: [facility];  Service: Cardiovascular;  Laterality: N/A;   PENILE PROSTHESIS IMPLANT     '05   THORACENTESIS Right 04/10/2022   Procedure: Mathews Robinsons;  Surgeon: Lanier Clam, MD;  [facility]: [facility] ENDOSCOPY;  Service: Pulmonary;  Laterality: Right;     Social History:   reports that he has quit smoking. His smoking use included cigarettes. He has a 12.50 pack-year smoking history. He has never  used smokeless tobacco. He reports that he does not currently use alcohol. He reports that he does not currently use drugs after having used the following drugs: Cocaine and Marijuana.   Family History:  His family history includes Aneurysm in his father and mother; Bone cancer in his brother; Cancer in his brother and another family member; Multiple myeloma in his mother; Prostate cancer in his brother and brother. There is no history of Breast cancer, Colon cancer, or Pancreatic cancer.   Allergies No Known Allergies   Home Medications  Prior to Admission medications   Medication Sig Start Date End Date Taking? Authorizing Provider  albuterol (VENTOLIN HFA) 108 (90 Base) MCG/ACT inhaler Inhale 2 puffs into the lungs every 4 (four) hours as needed for shortness of breath. Patient taking differently: Inhale 2 puffs into the lungs 2 (two) times daily as needed for shortness of breath or wheezing. 02/06/22  Yes Hunsucker, Bonna Gains, MD  ASPIRIN PO Take 0.5 tablets by mouth daily.   Yes [provider]  Ensure (ENSURE) Take 237 mLs by mouth daily. Dialysis formula needed   Yes [provider]  Tiotropium Bromide-Olodaterol  (STIOLTO RESPIMAT) 2.5-2.5 MCG/ACT AERS Inhale 2 puffs into the lungs daily. Patient taking differently: Inhale 2 puffs into the lungs 2 (two) times daily. 04/04/22  Yes Hunsucker, Bonna Gains, MD  amLODipine (NORVASC) 5 MG tablet Take 5 mg by mouth daily. Patient not taking: Reported on 07/31/2022    [provider]  atorvastatin (LIPITOR) 40 MG tablet Take 40 mg by mouth at bedtime. Patient not taking: Reported on 07/31/2022    [provider]  carvedilol (COREG) 3.125 MG tablet Take 1 tablet (3.125 mg total) by mouth 2 (two) times daily with a meal. Patient not taking: Reported on 04/08/2022 11/27/21 12/27/21  Darliss Cheney, MD  cinacalcet (SENSIPAR) 30 MG tablet Take 30 mg by mouth daily. 04/09/22   [provider]  DOCUSATE SODIUM PO Take 240 mg by mouth daily. Patient not taking: Reported on 07/31/2022    [provider]  hydrALAZINE (APRESOLINE) 10 MG tablet Take 10 mg by mouth 2 (two) times daily. Patient not taking: Reported on 07/31/2022    [provider]  isosorbide-hydrALAZINE (BIDIL) 20-37.5 MG tablet TAKE 1 TABLET BY MOUTH TWICE A DAY (APPROVED) Patient not taking: Reported on 07/31/2022 11/29/21   [provider]  losartan (COZAAR) 100 MG tablet Take 100 mg by mouth daily. Patient not taking: Reported on 07/31/2022    [provider]  metoprolol succinate (TOPROL-XL) 25 MG 24 hr tablet Take 12.5 mg by mouth daily. 06/18/22   [provider]  omeprazole (PRILOSEC) 20 MG capsule Take 20 mg by mouth daily. Patient not taking: Reported on 07/31/2022    [provider]  terazosin (HYTRIN) 5 MG capsule Take 5 mg by mouth at bedtime. Patient not taking: Reported on 07/31/2022    [provider]     Critical care time: Ferol Luz Jalan Bodi ACNP Acute Care Nurse Practitioner [place] Please consult Amion 07/31/2022, 10:41 AM

## 2022-07-31 NOTE — ED Triage Notes (Signed)
Pt arrived to preop for graft placement. Noted to be SOB and having accessory muscle use. Dialysis completed yesterday. Worsening SOB for several days with exertion. Pt was placed on 6L with sats in low 90's. Non productive cough and no fever. Diminished breath sounds with wheeze.

## 2022-07-31 NOTE — Progress Notes (Addendum)
0830 Patient arrived in surgical short stay via his electric scooter.  He was having some SOB and using his accessory muscles.  Initial O2 sat was 93 on room air.  Placed on 2L and up to 98%.  Dr. Christella Hartigan notified and Dr. Eligha Bridegroom and Dr. Trula Slade came to bedside. Surgery will be rescheduled per Dr. Trula Slade and will take patient to the emergency department for further evaluation.   0915 Called rapid response and obtained chest x ray.  Patient IV placed by Dr. Christella Hartigan and labs were sent.    0940 Patient transported by rapid to emergency department (room 8)  Chaplain rolling the electric scooter and patient belonging bags on bed with patient.

## 2022-07-31 NOTE — Procedures (Signed)
Thoracentesis  Procedure Note  [name]  [id number]  September 23, 1941  Date:07/31/22  Time:11:31 AM   Provider Performing:Layken Beg R Briana Farner   Procedure: Thoracentesis with imaging guidance (01601)  Indication(s) Pleural Effusion  Consent Risks of the procedure as well as the alternatives and risks of each were explained to the patient and/or caregiver.  Consent for the procedure was obtained and is signed in the bedside chart  Anesthesia Topical only with 1% lidocaine    Time Out Verified patient identification, verified procedure, site/side was marked, verified correct patient position, special equipment/implants available, medications/allergies/relevant history reviewed, required imaging and test results available.   Sterile Technique Maximal sterile technique including full sterile barrier drape, hand hygiene, sterile gown, sterile gloves, mask, hair covering, sterile ultrasound probe cover (if used).  Procedure Description Ultrasound was used to identify appropriate pleural anatomy for placement and overlying skin marked.  A simple hypoechoic effusion was noted. Area of drainage cleaned and draped in sterile fashion. Lidocaine was used to anesthetize the skin and subcutaneous tissue.  1600 cc's of serous appearing fluid was drained from the right pleural space.Procedure aborted as bag was full. Catheter then removed and bandaid applied to site.   Complications/Tolerance None; patient tolerated the procedure well. Chest X-ray is ordered to confirm no post-procedural complication.   EBL none   Specimen(s) Pleural fluid

## 2022-07-31 NOTE — ED Notes (Signed)
Critical Care MD at bedside.  

## 2022-07-31 NOTE — Progress Notes (Signed)
Chaplain paged for Rapid Response. Mr Giangregorio reports that yesterday he was scheduled for surgery to move his dialysis port. He brought himself to the hospital today for surgery and upon arrival had shortness of breath. A rapid response was called, his surgery was cancelled, and pt was sent to the ED for further evaluation. Chaplain introduced spiritual care and offered support to patient who was alone. Chaplain offered reflective listening and calming presence and facilitated with transport of pt belongings to his room in the ED (2 bags and his mobility scooter delivered to his ED room.) Pt reports he has called someone to join him in the ED. Chaplain passed along referral to ED chaplain [place].   Please page as further needs arise.  Donald Prose. Elyn Peers, M.Div. [facility] Chaplain Pager [phone] Office [phone]

## 2022-07-31 NOTE — Consult Note (Signed)
Reason for Consult: ESRD Referring Physician:  Dr. Gilford Raid  Chief Complaint: Shortness of breath  Dialysis Orders [facility] T[facility] 4hr 400/500 EDW 74.5kg -> appears to be much lower now but needs a floor scoale 2/2.5 Elisio Heparin 3K bolus, Hectorol 64mg  Assessment/Plan: ESRD - clinically he appears stable, comfortable and speaking in full sentences but saturations are certainly low. Reportedly it was much higher earlier after the thoracentesis. I spoke with his dialysis clinic and they have been trying to challenge his EDW given his h/o dyspnea. - Will plan on dialyzing him tonight; depending on response he can be discharged from the ED. He should still go to his clinic tomorrow AM for another treatment though; he is certainly overloaded. Renal osteodystrophy - will check a phos for binder management if he's still here in the AM. Anemia -  @ goal; no ESA indicated Pulmonary effusion s/p thoracentesis with 1.6L serous fluid removed. HTN - well controlled COPD - breathing treatments CASHD - compensated DM   HPI: [name] is an 81y.o. male HTN, CASHD, DM , COPD, prostate cancer and ESRD for 3 years. [facility] He's presenting with shortness of breath for a few days even though he completed a full treatment at dialysis on Tuesday. [facility] but was noted to be dyspeic requiring 6L of O2 to maintain sats above 90%. Patient denies fever, chills, nausea, vomiting, chest pain or sick contacts. Rapid response was actually called because of accessory muscle use and critical care drained a large left sided pleural effusion of 1.6L of serous fluid; tap discontinued because the bag was full.   ROS Pertinent items are noted in HPI.  Chemistry and CBC: Creatinine  Date/Time Value Ref Range Status  07/03/2018 03:15 PM 3.07 (HH) 0.61 - 1.24 mg/dL Final    Comment:    CRITICAL RESULT CALLED TO, READ  BACK BY AND VERIFIED WITH: Tonie Bryant,RN at 1618 by MPrudencio Burly   Creatinine, Ser  Date/Time Value Ref Range Status  07/31/2022 09:23 AM 3.67 (H) 0.61 - 1.24 mg/dL Final  07/31/2022 08:52 AM 3.90 (H) 0.61 - 1.24 mg/dL Final  11/27/2021 04:08 AM 6.14 (H) 0.61 - 1.24 mg/dL Final  11/26/2021 11:18 AM 5.25 (H) 0.61 - 1.24 mg/dL Final  11/26/2021 09:10 AM 5.16 (H) 0.61 - 1.24 mg/dL Final  06/25/2021 04:54 PM 5.63 (H) 0.61 - 1.24 mg/dL Final  10/05/2020 04:56 PM 3.08 (H) 0.61 - 1.24 mg/dL Final  07/29/2020 02:16 PM 5.40 (H) 0.61 - 1.24 mg/dL Final  08/14/2019 12:06 PM 4.85 (H) 0.61 - 1.24 mg/dL Final  08/01/2019 12:25 AM 5.35 (H) 0.61 - 1.24 mg/dL Final  07/17/2019 01:56 PM 6.57 (H) 0.61 - 1.24 mg/dL Final  04/27/2019 03:09 AM 6.47 (H) 0.61 - 1.24 mg/dL Final  04/26/2019 04:43 AM 6.98 (H) 0.61 - 1.24 mg/dL Final  04/25/2019 05:13 AM 6.19 (H) 0.61 - 1.24 mg/dL Final  04/24/2019 04:16 AM 6.09 (H) 0.61 - 1.24 mg/dL Final  04/23/2019 02:52 PM 6.31 (H) 0.61 - 1.24 mg/dL Final  04/23/2019 06:49 AM 6.42 (H) 0.61 - 1.24 mg/dL Final  04/22/2019 06:15 AM 5.56 (H) 0.61 - 1.24 mg/dL Final  04/21/2019 02:19 PM 5.54 (H) 0.61 - 1.24 mg/dL Final  04/20/2019 04:46 AM 5.65 (H) 0.61 - 1.24 mg/dL Final  04/19/2019 01:46 PM 5.73 (H) 0.61 - 1.24 mg/dL Final  03/15/2019 04:43 PM 5.71 (H) 0.61 - 1.24 mg/dL  Final  06/21/2018 05:17 PM 3.14 (H) 0.61 - 1.24 mg/dL Final  03/30/2017 03:53 AM 2.35 (H) 0.61 - 1.24 mg/dL Final  03/29/2017 01:00 PM 2.30 (H) 0.61 - 1.24 mg/dL Final  03/29/2017 12:43 PM 2.35 (H) 0.61 - 1.24 mg/dL Final  09/29/2012 06:25 PM 1.24 0.50 - 1.35 mg/dL Final  09/26/2012 11:59 PM 1.29 0.50 - 1.35 mg/dL Final  12/14/2010 03:45 AM 1.33 0.4 - 1.5 mg/dL Final  11/22/2010 04:25 AM 1.13 0.4 - 1.5 mg/dL Final  11/21/2010 01:32 AM 1.23 0.4 - 1.5 mg/dL Final  11/20/2010 01:11 PM 1.8 (H) 0.4 - 1.5 mg/dL Final  11/20/2010 01:10 PM 1.83 (H) 0.4 - 1.5 mg/dL Final   Recent Labs  Lab 07/31/22 0852  07/31/22 0923  NA 131* 135  K 6.5* 3.5  CL 96* 91*  CO2  --  29  GLUCOSE 92 97  BUN 44* 28*  CREATININE 3.90* 3.67*  CALCIUM  --  8.6*   Recent Labs  Lab 07/31/22 0852 07/31/22 0923  WBC  --  8.3  HGB 13.6 12.8*  HCT 40.0 38.4*  MCV  --  101.3*  PLT  --  143*   Liver Function Tests: No results for input(s): "AST", "ALT", "ALKPHOS", "BILITOT", "PROT", "ALBUMIN" in the last 168 hours. No results for input(s): "LIPASE", "AMYLASE" in the last 168 hours. No results for input(s): "AMMONIA" in the last 168 hours. Cardiac Enzymes: No results for input(s): "CKTOTAL", "CKMB", "CKMBINDEX", "TROPONINI" in the last 168 hours. Iron Studies: No results for input(s): "IRON", "TIBC", "TRANSFERRIN", "FERRITIN" in the last 72 hours. PT/INR: _0 (inr:5)  Xrays/Other Studies: ) Results for orders placed or performed during the hospital encounter of 07/31/22 (from the past 48 hour(s))  Glucose, capillary     Status: None   Collection Time: 07/31/22  8:08 AM  Result Value Ref Range   Glucose-Capillary 90 70 - 99 mg/dL    Comment: Glucose reference range applies only to samples taken after fasting for at least 8 hours.  I-STAT, chem 8     Status: Abnormal   Collection Time: 07/31/22  8:52 AM  Result Value Ref Range   Sodium 131 (L) 135 - 145 mmol/L   Potassium 6.5 (HH) 3.5 - 5.1 mmol/L   Chloride 96 (L) 98 - 111 mmol/L   BUN 44 (H) 8 - 23 mg/dL   Creatinine, Ser 3.90 (H) 0.61 - 1.24 mg/dL   Glucose, Bld 92 70 - 99 mg/dL    Comment: Glucose reference range applies only to samples taken after fasting for at least 8 hours.   Calcium, Ion 0.75 (LL) 1.15 - 1.40 mmol/L   TCO2 31 22 - 32 mmol/L   Hemoglobin 13.6 13.0 - 17.0 g/dL   HCT 40.0 39.0 - 52.0 %   Comment NOTIFIED PHYSICIAN   Basic metabolic panel     Status: Abnormal   Collection Time: 07/31/22  9:23 AM  Result Value Ref Range   Sodium 135 135 - 145 mmol/L   Potassium 3.5 3.5 - 5.1 mmol/L   Chloride 91 (L) 98 - 111 mmol/L    CO2 29 22 - 32 mmol/L   Glucose, Bld 97 70 - 99 mg/dL    Comment: Glucose reference range applies only to samples taken after fasting for at least 8 hours.   BUN 28 (H) 8 - 23 mg/dL   Creatinine, Ser 3.67 (H) 0.61 - 1.24 mg/dL   Calcium 8.6 (L) 8.9 - 10.3 mg/dL   GFR, Estimated 16 (L) >60  mL/min    Comment: (NOTE) Calculated using the CKD-EPI Creatinine Equation (2021)    Anion gap 15 5 - 15    Comment: Performed at [facility], [place] [address] [place], [place] [address]  CBC     Status: Abnormal   Collection Time: 07/31/22  9:23 AM  Result Value Ref Range   WBC 8.3 4.0 - 10.5 K/uL   RBC 3.79 (L) 4.22 - 5.81 MIL/uL   Hemoglobin 12.8 (L) 13.0 - 17.0 g/dL   HCT 38.4 (L) 39.0 - 52.0 %   MCV 101.3 (H) 80.0 - 100.0 fL   MCH 33.8 26.0 - 34.0 pg   MCHC 33.3 30.0 - 36.0 g/dL   RDW 14.7 11.5 - 15.5 %   Platelets 143 (L) 150 - 400 K/uL    Comment: REPEATED TO VERIFY   nRBC 0.0 0.0 - 0.2 %    Comment: Performed at [facility], [place] [address] [place], [place] [address]  Magnesium     Status: None   Collection Time: 07/31/22  9:23 AM  Result Value Ref Range   Magnesium 2.0 1.7 - 2.4 mg/dL    Comment: Performed at [facility] [address] [place], [place] [address]  Brain natriuretic peptide     Status: Abnormal   Collection Time: 07/31/22  9:34 AM  Result Value Ref Range   B Natriuretic Peptide >4,500.0 (H) 0.0 - 100.0 pg/mL    Comment: Performed at [facility] [address], [place], [place] [address]  Resp Panel by RT-PCR (Flu A&B, Covid) Anterior Nasal Swab     Status: None   Collection Time: 07/31/22 10:00 AM   Specimen: Anterior Nasal Swab  Result Value Ref Range   SARS Coronavirus 2 by RT PCR NEGATIVE NEGATIVE    Comment: (NOTE) SARS-CoV-2 target nucleic acids are NOT DETECTED.  The SARS-CoV-2 RNA is generally detectable in upper respiratory specimens during the acute phase of infection. The lowest concentration of SARS-CoV-2 viral copies  this assay can detect is 138 copies/mL. A negative result does not preclude SARS-Cov-2 infection and should not be used as the sole basis for treatment or other patient management decisions. A negative result may occur with  improper specimen collection/handling, submission of specimen other than nasopharyngeal swab, presence of viral mutation(s) within the areas targeted by this assay, and inadequate number of viral copies(<138 copies/mL). A negative result must be combined with clinical observations, patient history, and epidemiological information. The expected result is Negative.  Fact Sheet for Patients:  EntrepreneurPulse.com.au  Fact Sheet for Healthcare Providers:  IncredibleEmployment.be  This test is no t yet approved or cleared by the [place] FDA and  has been authorized for detection and/or diagnosis of SARS-CoV-2 by FDA under an Emergency Use Authorization (EUA). This EUA will remain  in effect (meaning this test can be used) for the duration of the COVID-19 declaration under Section 564(b)(1) of the Act, 21 U.S.C.section 360bbb-3(b)(1), unless the authorization is terminated  or revoked sooner.       Influenza A by PCR NEGATIVE NEGATIVE   Influenza B by PCR NEGATIVE NEGATIVE    Comment: (NOTE) The Xpert Xpress SARS-CoV-2/FLU/RSV plus assay is intended as an aid in the diagnosis of influenza from Nasopharyngeal swab specimens and should not be used as a sole basis for treatment. Nasal washings and aspirates are unacceptable for Xpert Xpress SARS-CoV-2/FLU/RSV testing.  Fact Sheet for Patients: EntrepreneurPulse.com.au  Fact Sheet for Healthcare Providers: IncredibleEmployment.be  This test is not yet approved or cleared by the [place] and has been authorized for detection and/or diagnosis of SARS-CoV-2 by FDA under an Emergency Use Authorization (EUA). This EUA will  remain in effect (meaning this test can be used) for the duration of the COVID-19 declaration under Section 564(b)(1) of the Act, 21 U.S.C. section 360bbb-3(b)(1), unless the authorization is terminated or revoked.  Performed at [facility], [place] [address] [place], [place] [address]   Protein, pleural or peritoneal fluid     Status: None   Collection Time: 07/31/22 11:24 AM  Result Value Ref Range   Total protein, fluid 3.9 g/dL    Comment: (NOTE) No normal range established for this test Results should be evaluated in conjunction with serum values    Fluid Type-FTP Pleural R     Comment: Performed at [facility] [address], [place], [place] [address]  Lactate dehydrogenase (pleural or peritoneal fluid)     Status: Abnormal   Collection Time: 07/31/22 11:24 AM  Result Value Ref Range   LD, Fluid 136 (H) 3 - 23 U/L    Comment: (NOTE) Results should be evaluated in conjunction with serum values    Fluid Type-FLDH Pleural R     Comment: Performed at [facility] [address], [place], [place] [address]  Body fluid cell count with differential     Status: Abnormal   Collection Time: 07/31/22 11:24 AM  Result Value Ref Range   Fluid Type-FCT PLEU RIGHT    Color, Fluid AMBER (A) YELLOW   Appearance, Fluid CLOUDY (A) CLEAR   Total Nucleated Cell Count, Fluid 223 0 - 1,000 cu mm   Neutrophil Count, Fluid 2 0 - 25 %   Lymphs, Fluid 87 %   Monocyte-Macrophage-Serous Fluid 10 (L) 50 - 90 %   Eos, Fluid 0 %   Other Cells, Fluid FEW MESOTHELIAL CELLS SEEN, 1 BASOPHIL %    Comment: Performed at [facility] [address] [place],  [address]   DG CHEST PORT 1 VIEW  Result Date: 07/31/2022 CLINICAL DATA:  81 year old male status post thoracentesis. EXAM: PORTABLE CHEST 1 VIEW COMPARISON:  Portable chest 0934 hours today and earlier. FINDINGS: Portable AP semi upright view at 1115 hours. Mildly rotated to the right now. Layering right pleural  effusion appears mildly decreased. No pneumothorax. Cardiomegaly and other mediastinal contours appear stable. No new pulmonary opacity, left lung appears negative. Stable right chest dual lumen dialysis type catheter. Paucity bowel gas in the upper abdomen. IMPRESSION: Decreased right pleural effusion following thoracentesis with no pneumothorax. Electronically Signed   By: Genevie Ann M.D.   On: 07/31/2022 11:27   DG Chest Port 1 View  Result Date: 07/31/2022 CLINICAL DATA:  Acute respiratory distress. Cough and shortness of breath. EXAM: PORTABLE CHEST 1 VIEW COMPARISON:  Chest x-ray dated April 10, 2022. FINDINGS: New tunneled right internal jugular dialysis catheter with tip in the distal SVC. Unchanged moderate cardiomegaly. Mild pulmonary vascular congestion. Moderate right pleural effusion with right middle and lower lobe atelectasis. No pneumothorax. No acute osseous abnormality. IMPRESSION: 1. Moderate right pleural effusion with right middle and lower lobe atelectasis. Electronically Signed   By: Titus Dubin M.D.   On: 07/31/2022 09:43    PMH:   Past Medical History:  Diagnosis Date   Chronic kidney disease    renal insufficiency   Coronary artery disease    Diabetes mellitus  type 2-non-insulin   GERD (gastroesophageal reflux disease)    Gout    Hypertension    Obesity    Prostate cancer ([place])    Tobacco abuse counseling     PSH:   Past Surgical History:  Procedure Laterality Date   AV FISTULA PLACEMENT Left 04/26/2019   Procedure: INSERTION OF ARTERIOVENOUS (AV) GORE-TEX GRAFT ARM - using [facility] Graft size 45cm;  Surgeon: Rosetta Posner, MD;  Location: [facility];  Service: Vascular;  Laterality: Left;   CARDIAC CATHETERIZATION  02/08/02   CARDIAC CATHETERIZATION  11/20/10   drug eluting stent;mid left LAD, There is mild hypokinesis of the anterolateral wall. LVEF  is estimated at 50-55%   INSERTION OF BRONCHIAL STENT     went in groin to R chest   INSERTION OF DIALYSIS  CATHETER Right 04/26/2019   Procedure: INSERTION OF HEMODIALYSIS CATHETER - using Palindrome Catheter size 23cm;  Surgeon: Rosetta Posner, MD;  Location: [facility];  Service: Vascular;  Laterality: Right;   LEFT HEART CATH AND CORONARY ANGIOGRAPHY N/A 04/23/2019   Procedure: LEFT HEART CATH AND CORONARY ANGIOGRAPHY;  Surgeon: Sherren Mocha, MD;  Location: [facility];  Service: Cardiovascular;  Laterality: N/A;   PENILE PROSTHESIS IMPLANT     '05   THORACENTESIS Right 04/10/2022   Procedure: Mathews Robinsons;  Surgeon: Lanier Clam, MD;  Location: [facility] ENDOS[facility];  Service: Pulmonary;  Laterality: Right;    Allergies: No Known Allergies  Medications:   Prior to Admission medications   Medication Sig Start Date End Date Taking? Authorizing Provider  albuterol (VENTOLIN HFA) 108 (90 Base) MCG/ACT inhaler Inhale 2 puffs into the lungs every 4 (four) hours as needed for shortness of breath. Patient taking differently: Inhale 2 puffs into the lungs 2 (two) times daily as needed for shortness of breath or wheezing. 02/06/22  Yes Hunsucker, Bonna Gains, MD  ASPIRIN PO Take 0.5 tablets by mouth daily.   Yes [provider]  Ensure (ENSURE) Take 237 mLs by mouth daily. Dialysis formula needed   Yes [provider]  Tiotropium Bromide-Olodaterol (STIOLTO RESPIMAT) 2.5-2.5 MCG/ACT AERS Inhale 2 puffs into the lungs daily. Patient taking differently: Inhale 2 puffs into the lungs 2 (two) times daily. 04/04/22  Yes Hunsucker, Bonna Gains, MD  amLODipine (NORVASC) 5 MG tablet Take 5 mg by mouth daily. Patient not taking: Reported on 07/31/2022    [provider]  atorvastatin (LIPITOR) 40 MG tablet Take 40 mg by mouth at bedtime. Patient not taking: Reported on 07/31/2022    [provider]  carvedilol (COREG) 3.125 MG tablet Take 1 tablet (3.125 mg total) by mouth 2 (two) times daily with a meal. Patient not taking: Reported on 04/08/2022 11/27/21 12/27/21  Darliss Cheney, MD   cinacalcet (SENSIPAR) 30 MG tablet Take 30 mg by mouth daily. 04/09/22   [provider]  DOCUSATE SODIUM PO Take 240 mg by mouth daily. Patient not taking: Reported on 07/31/2022    [provider]  hydrALAZINE (APRESOLINE) 10 MG tablet Take 10 mg by mouth 2 (two) times daily. Patient not taking: Reported on 07/31/2022    [provider]  isosorbide-hydrALAZINE (BIDIL) 20-37.5 MG tablet TAKE 1 TABLET BY MOUTH TWICE A DAY (APPROVED) Patient not taking: Reported on 07/31/2022 11/29/21   [provider]  losartan (COZAAR) 100 MG tablet Take 100 mg by mouth daily. Patient not taking: Reported on 07/31/2022    [provider]  metoprolol succinate (TOPROL-XL) 25 MG 24 hr  tablet Take 12.5 mg by mouth daily. 06/18/22   [provider]  omeprazole (PRILOSEC) 20 MG capsule Take 20 mg by mouth daily. Patient not taking: Reported on 07/31/2022    [provider]  terazosin (HYTRIN) 5 MG capsule Take 5 mg by mouth at bedtime. Patient not taking: Reported on 07/31/2022    [provider]    Discontinued Meds:   Medications Discontinued During This Encounter  Medication Reason   aspirin EC 81 MG tablet Dose change   0.9 %  sodium chloride infusion Patient Transfer   chlorhexidine (HIBICLENS) 4 % liquid 4 Application Patient Transfer   chlorhexidine (HIBICLENS) 4 % liquid 4 Application Patient Transfer   ceFAZolin (ANCEF) IVPB 2g/100 mL premix Patient Transfer   lactated ringers infusion Patient Transfer   chlorhexidine (PERIDEX) 0.12 % solution 15 mL Patient Transfer   Oral care mouth rinse Patient Transfer    Social History:  reports that he has quit smoking. His smoking use included cigarettes. He has a 12.50 pack-year smoking history. He has never used smokeless tobacco. He reports that he does not currently use alcohol. He reports that he does not currently use drugs after having used the following drugs: Cocaine and  Marijuana.  Family History:   Family History  Problem Relation Age of Onset   Aneurysm Mother    Multiple myeloma Mother    Aneurysm Father    Cancer Brother    Prostate cancer Brother    Cancer Other    Prostate cancer Brother    Bone cancer Brother    Breast cancer Neg Hx    Colon cancer Neg Hx    Pancreatic cancer Neg Hx     Blood pressure 99/74, pulse 91, temperature 97.9 F (36.6 C), resp. rate 19, height _0  (1.727 m), weight 66.7 kg, SpO2 97 %. General appearance: alert, cooperative, and appears stated age Head: Normocephalic, without obvious abnormality, atraumatic Eyes: negative Neck: no adenopathy, no carotid bruit, supple, symmetrical, trachea midline, and thyroid not enlarged, symmetric, no tenderness/mass/nodules Back: symmetric, no curvature. ROM normal. No CVA tenderness. Resp: diminished breath sounds bilaterally Cardio: tachy GI: soft, non-tender; bowel sounds normal; no masses,  no organomegaly Extremities: edema 1-2+ Pulses: 2+ and symmetric Access: RIJ TC, Lt FAL thrombosed       Rucha Wissinger, Hunt Oris, MD 07/31/2022, 4:00 PM

## 2022-07-31 NOTE — Significant Event (Signed)
Rapid Response Event Note   Reason for Call :  SOB, use of accessory muscles   Initial Focused Assessment:  Patient sitting up in bed with increased WOB using accessory muscles as he takes shallow breaths. Patient with regular heart sounds. Lung sounds diminished on right. No other complaints.   VS: 107/80 (90) HR 100 RR 25 O2 89% 6L [place]  Interventions:  Labs drawn: CBC, CMP, BNP, mag CXR Increased O2 [place] 3L--6L  Plan of Care:  Patient taken to ED to be worked up   Event Summary:  MD Notified: C. Christella Hartigan MD Call Time: [place] Time: 0459 End Time: [place]  Newman Nickels, RN

## 2022-07-31 NOTE — Discharge Instructions (Addendum)
Go to your dialysis as scheduled.  Return for worsening difficulty breathing

## 2022-08-01 LAB — HEPATITIS B SURFACE ANTIBODY, QUANTITATIVE: Hep B S AB Quant (Post): 89.3 m[IU]/mL

## 2022-08-02 LAB — PATHOLOGIST SMEAR REVIEW

## 2022-08-03 LAB — BODY FLUID CULTURE W GRAM STAIN
Culture: NO GROWTH
Special Requests: NORMAL

## 2022-08-08 ENCOUNTER — Telehealth: Payer: Self-pay

## 2022-08-08 NOTE — Telephone Encounter (Signed)
Renee Rival from the [facility] Nephrology Dept called wanting to know if we can reschedule pts L arm AVG insertion that was canceled on 07/31/22.  He is anxious and is ready to reschedule.  Pt normally dialyzes on T, Th, Sat.  Next week he will Dialyze M, W, Sat.  Kristin Bruins was advised that I will give this information to our schedulers.

## 2022-09-11 ENCOUNTER — Encounter (HOSPITAL_COMMUNITY): Payer: Self-pay

## 2022-09-11 ENCOUNTER — Emergency Department (HOSPITAL_COMMUNITY): Payer: No Typology Code available for payment source

## 2022-09-11 ENCOUNTER — Other Ambulatory Visit: Payer: Self-pay

## 2022-09-11 ENCOUNTER — Emergency Department (HOSPITAL_COMMUNITY)
Admission: EM | Admit: 2022-09-11 | Discharge: 2022-09-12 | Disposition: A | Discharge status: Home / Self Care | Payer: No Typology Code available for payment source | Attending: Student | Admitting: Student

## 2022-09-11 DIAGNOSIS — R0602 Shortness of breath: Secondary | ICD-10-CM | POA: Insufficient documentation

## 2022-09-11 DIAGNOSIS — M7989 Other specified soft tissue disorders: Secondary | ICD-10-CM | POA: Insufficient documentation

## 2022-09-11 DIAGNOSIS — Z20822 Contact with and (suspected) exposure to covid-19: Secondary | ICD-10-CM | POA: Diagnosis not present

## 2022-09-11 DIAGNOSIS — Z5321 Procedure and treatment not carried out due to patient leaving prior to being seen by health care provider: Secondary | ICD-10-CM | POA: Diagnosis not present

## 2022-09-11 DIAGNOSIS — Z992 Dependence on renal dialysis: Secondary | ICD-10-CM | POA: Diagnosis not present

## 2022-09-11 LAB — RESP PANEL BY RT-PCR (RSV, FLU A&B, COVID)  RVPGX2
Influenza A by PCR: NEGATIVE
Influenza B by PCR: NEGATIVE
Resp Syncytial Virus by PCR: NEGATIVE
SARS Coronavirus 2 by RT PCR: NEGATIVE

## 2022-09-11 NOTE — ED Provider Triage Note (Signed)
Emergency Medicine Provider Triage Evaluation Note  [name]. , a 81 y.o. male  was evaluated in triage.  Pt complains of shortness of breath, lower extremity swelling x 2 days.  No chest pain, full dialysis Tuesday Thursday Saturday and no missed sessions.  Currently smoking..  Review of Systems  Per PHI  Physical Exam  BP 95/65   Pulse 75   Temp (!) 97.3 F (36.3 C) (Oral)   Resp (!) 24   Ht '5\' 8"'$  (1.727 m)   Wt 64.4 kg   SpO2 96%   BMI 21.59 kg/m  Gen:   Awake, no distress   Resp:  Normal effort.  Wheezing, rales MSK:   Moves extremities without difficulty  Other:  Lower extremity edema bilaterally  Medical Decision Making  Medically screening exam initiated at 4:55 PM.  Appropriate orders placed.  [name]. was informed that the remainder of the evaluation will be completed by another provider, this initial triage assessment does not replace that evaluation, and the importance of remaining in the ED until their evaluation is complete.     Sherrill Raring, PA-C 09/11/22 1656

## 2022-09-11 NOTE — ED Triage Notes (Signed)
PER EMS: pt reports bilateral foot swelling that started yesterday. Received dialysis Tue/Thur/Sat, last full treatment was yesterday. He reports mild exertional SOB.  BP-128/80, HR-80

## 2022-09-12 NOTE — ED Notes (Signed)
Pt name called multiple times for updated vitals, no response.

## 2022-09-17 ENCOUNTER — Other Ambulatory Visit: Payer: Self-pay

## 2022-09-17 ENCOUNTER — Encounter (HOSPITAL_COMMUNITY): Payer: Self-pay | Admitting: Surgery

## 2022-09-17 NOTE — Progress Notes (Addendum)
PCP -  [facility] Cardiologist - [place]  Chest x-ray - 09/21/22 EKG - 09/11/22 ECHO - 05/31/22 Cardiac Cath - 04/23/19  Fasting Blood Sugar - 70's-80's Checks Blood Sugar 2/week  ERAS Protcol - NPO  Anesthesia review: Y  Patient verbally denies any shortness of breath, fever, cough and chest pain during phone call   -------------  SDW INSTRUCTIONS given:  Your procedure is scheduled on 09/18/22.  Report to [facility] Main Entrance "A" at [place].M., and check in at the Admitting office.  Call this number if you have problems the morning of surgery:  [phone]   Remember:  Do not eat or drink after midnight the night before your surgery    Take these medicines the morning of surgery with A SIP OF WATER  cinacalcet (SENSIPAR) metoprolol succinate (TOPROL-XL)  Tiotropium Bromide-Olodaterol (STIOLTO RESPIMAT)  albuterol (VENTOLIN HFA)-if needed (Please bring on the day of surgery)  As of today, STOP taking any Aspirin (unless otherwise instructed by your surgeon) Aleve, Naproxen, Ibuprofen, Motrin, Advil, Goody's, BC's, all herbal medications, fish oil, and all vitamins.                      Do not wear jewelry, make up, or nail polish            Do not wear lotions, powders, perfumes/colognes, or deodorant.            Do not shave 48 hours prior to surgery.  Men may shave face and neck.            Do not bring valuables to the hospital.            [facility] is not responsible for any belongings or valuables.  Do NOT Smoke (Tobacco/Vaping) 24 hours prior to your procedure If you use a CPAP at night, you may bring all equipment for your overnight stay.   Contacts, glasses, dentures or bridgework may not be worn into surgery.      For patients admitted to the hospital, discharge time will be determined by your treatment team.   Patients discharged the day of surgery will not be allowed to drive home, and someone needs to stay with them for 24  hours.    Special instructions:   [place]- Preparing For Surgery  Before surgery, you can play an important role. Because skin is not sterile, your skin needs to be as free of germs as possible. You can reduce the number of germs on your skin by washing with CHG (chlorahexidine gluconate) Soap before surgery.  CHG is an antiseptic cleaner which kills germs and bonds with the skin to continue killing germs even after washing.    Oral Hygiene is also important to reduce your risk of infection.  Remember - BRUSH YOUR TEETH THE MORNING OF SURGERY WITH YOUR REGULAR TOOTHPASTE  Please do not use if you have an allergy to CHG or antibacterial soaps. If your skin becomes reddened/irritated stop using the CHG.  Do not shave (including legs and underarms) for at least 48 hours prior to first CHG shower. It is OK to shave your face.  Please follow these instructions carefully.   Shower the NIGHT BEFORE SURGERY and the MORNING OF SURGERY with DIAL Soap.   Pat yourself dry with a CLEAN TOWEL.  Wear CLEAN PAJAMAS to bed the night before surgery  Place CLEAN SHEETS on your bed the night of your first shower and DO NOT SLEEP WITH  PETS.   Day of Surgery: Please shower morning of surgery  Wear Clean/Comfortable clothing the morning of surgery Do not apply any deodorants/lotions.   Remember to brush your teeth WITH YOUR REGULAR TOOTHPASTE.   Questions were answered. Patient verbalized understanding of instructions.

## 2022-09-17 NOTE — Anesthesia Preprocedure Evaluation (Addendum)
Anesthesia Evaluation  Patient identified by MRN, date of birth, ID band Patient awake    Reviewed: Allergy & Precautions, NPO status , Patient's Chart, lab work & pertinent test results, reviewed documented beta blocker date and time   History of Anesthesia Complications Negative for: history of anesthetic complications  Airway Mallampati: I  TM Distance: >3 FB Neck ROM: Full    Dental  (+) Dental Advisory Given, Missing, Poor Dentition   Pulmonary COPD,  COPD inhaler, former smoker  Wheezing improved with Albuterol neb  breath sounds clear to auscultation       Cardiovascular hypertension, Pt. on medications and Pt. on home beta blockers (-) angina + CAD (severe PDA, mod LAD disease by '20 cath), + Cardiac Stents and +CHF   Rhythm:Regular Rate:Normal  05/2022 ECHO: EF 15-20%  '20 ECHO: severe global hypokinesis with EF 30-35%, mod LVH, mildly decreased RVF, thickened MV with mild MR,  tricuspid valve veg, mild TR   Neuro/Psych negative neurological ROS     GI/Hepatic ,GERD  Controlled,,(+)     substance abuse  cocaine use and marijuana use  Endo/Other  diabetes (glu 87)    Renal/GU Dialysis and ESRFRenal disease   Prostate cancer    Musculoskeletal  (+)  narcotic dependent  Abdominal   Peds  Hematology  (+) Blood dyscrasia, anemia   Anesthesia Other Findings   Reproductive/Obstetrics                              Anesthesia Physical Anesthesia Plan  ASA: 4  Anesthesia Plan: Regional   Post-op Pain Management: Tylenol PO (pre-op)*   Induction:   PONV Risk Score and Plan: 1 and Ondansetron  Airway Management Planned: Natural Airway and Simple Face Mask  Additional Equipment: None  Intra-op Plan:   Post-operative Plan:   Informed Consent: I have reviewed the patients History and Physical, chart, labs and discussed the procedure including the risks, benefits and  alternatives for the proposed anesthesia with the patient or authorized representative who has indicated his/her understanding and acceptance.     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   History of recurrent R>>L pleural effusion requiring thoracentesis. He was admitted at [facility] 9/7-9/10/23 for transudative right pleural effusion and severe LV dysfunction. Right sided thoracentesis yielded 1400cc. Echo showed severe deterioration of LV function with EF 15-20%, down from 30-35% by echo 04/20/19. GDMT limited by soft BP and hyperkalemia. He was discharged on Coreg 6.'25mg'$  twice daily. Pt has been contacted by cardiology at the [facility] to schedule followup but it does not appear he has done so.  Prior heart cath 04/23/19 showed mild RCA stenosis, widely patent left main, diffusely diseased LAD with moderate proximal stenosis and moderate in stent restenosis in the mid vessel, widely patent left circumflex. Cardiomyopathy felt out of proportion with extent of CAD.  Pt previously scheduled for this procedure 07/31/22, however presented with worsening SOB and was transferred to ED for management. He was found to have recurrent pleural effusion that was drained. He was also  evaluated by nephrology and had emergent dialysis. He had symptomatic improvement and was discharged home.  ESRD on HD T Th Sat. Left forearm graft occluded. Currently dialyzing through [facility].  Patient will need DOS labs and eval.  TTE 05/31/22 (care everywhere): Left ventricle   Mildly dilated chamber  with normal wall thickness. Severe diffuse  hypokinesis with LVEF 15-20%.   Abnormal diastolic function.     Right ventricle   Severely dilated with reduced systolic function.        Left atrium   Severely dilated.  Saline contrast injection suggests the presence of a  patent foramen ovale with right to left shunting.     Right atrium   Severely dilated. The interatrial septum bows to the left suggesting  elevated right atrial pressure. Estimated RA pressure 15 mm Hg (IVC  diameter >2.1 cm with inspiratory collapse <50%)    Mitral valve   Normal structure with moderate regurgitation.       Tricuspid valve   Poor leaflet coaptation resulting in severe regurgitation.   Unable to accurately assess RVSP.       Aortic valve   Trileaflet.  Moderate sclerosis with normal cusp excursion. No stenosis.  Trace regurgitation.     Pulmonic valve   Normal structure with mild regurgitation.       Ascending aorta   Normal aortic root diameter.     Pericardium   No pericardial effusion.   Cath 04/23/19: 1. Mild RCA stenosis with severe stenosis in the mid/distal PDA (appropriate for medical therapy with small rea of myocardium supplied) 2. Widely patent left main 3. Diffusely diseased LAD with moderate proximal stenosis and moderate in-stent restenosis in the mid-vessel 4. Widely patent left circumflex 5. Moderately elevated LVEDP   Recommend: medical therapy for CAD and CHF. Cardiomyopathy out of proportion to extent of CAD  )         Anesthesia Quick Evaluation

## 2022-09-17 NOTE — Progress Notes (Signed)
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   History of recurrent R>>L pleural effusion requiring thoracentesis. He was admitted at [facility] 9/7-9/10/23 for transudative right pleural effusion and severe LV dysfunction. Right sided thoracentesis yielded 1400cc. Echo showed severe deterioration of LV function with EF 15-20%, down from 30-35% by echo 04/20/19. GDMT limited by soft BP and hyperkalemia. He was discharged on Coreg 6.'25mg'$  twice daily. Pt has been contacted by cardiology at the [facility] to schedule followup but it does not appear he has done so.  Prior heart cath 04/23/19 showed mild RCA stenosis, widely patent left main, diffusely diseased LAD with moderate proximal stenosis and moderate in stent restenosis in the mid vessel, widely patent left circumflex. Cardiomyopathy felt out of proportion with extent of CAD.  Pt previously scheduled for this procedure 07/31/22, however presented with worsening SOB and was transferred to ED for management. He was found to have recurrent pleural effusion that was drained. He was also evaluated by nephrology and had emergent dialysis. He had symptomatic improvement and was discharged home.  ESRD on HD T Th Sat. Left forearm graft occluded. Currently dialyzing through [facility].  Patient will need DOS labs and eval.  TTE 05/31/22 (care everywhere): Left ventricle   Mildly dilated chamber with normal wall thickness. Severe diffuse  hypokinesis with LVEF 15-20%.   Abnormal diastolic function.     Right ventricle   Severely dilated with reduced systolic function.        Left atrium   Severely dilated.  Saline contrast injection suggests the presence of a   patent foramen ovale with right to left shunting.     Right atrium   Severely dilated. The interatrial septum bows to the left suggesting  elevated right atrial pressure. Estimated RA pressure 15 mm Hg (IVC  diameter >2.1 cm with inspiratory collapse <50%)    Mitral valve   Normal structure with moderate regurgitation.       Tricuspid valve   Poor leaflet coaptation resulting in severe regurgitation.   Unable to accurately assess RVSP.       Aortic valve   Trileaflet.  Moderate sclerosis with normal cusp excursion. No stenosis.  Trace regurgitation.     Pulmonic valve   Normal structure with mild regurgitation.       Ascending aorta   Normal aortic root diameter.     Pericardium   No pericardial effusion.   Cath 04/23/19: 1. Mild RCA stenosis with severe stenosis in the mid/distal PDA (appropriate for medical therapy with small rea of myocardium supplied) 2. Widely patent left main 3. Diffusely diseased LAD with moderate proximal stenosis and moderate in-stent restenosis in the mid-vessel 4. Widely patent left circumflex 5. Moderately elevated LVEDP   Recommend: medical therapy for CAD and CHF. Cardiomyopathy out of proportion to extent of CAD   Wynonia Musty [facility] Short Stay Center/Anesthesiology Phone [phone] 09/17/2022 10:12 AM

## 2022-09-18 ENCOUNTER — Encounter (HOSPITAL_COMMUNITY)
Admission: RE | Disposition: A | Discharge status: Home / Self Care | Payer: Self-pay | Source: Home / Self Care | Attending: Surgery

## 2022-09-18 ENCOUNTER — Other Ambulatory Visit: Payer: Self-pay

## 2022-09-18 ENCOUNTER — Ambulatory Visit (HOSPITAL_COMMUNITY)
Admission: RE | Admit: 2022-09-18 | Discharge: 2022-09-18 | Disposition: A | Discharge status: Home / Self Care | Payer: No Typology Code available for payment source | Attending: Surgery | Admitting: Surgery

## 2022-09-18 ENCOUNTER — Ambulatory Visit (HOSPITAL_BASED_OUTPATIENT_CLINIC_OR_DEPARTMENT_OTHER): Payer: No Typology Code available for payment source | Admitting: Physician Assistant

## 2022-09-18 ENCOUNTER — Ambulatory Visit (HOSPITAL_COMMUNITY): Payer: No Typology Code available for payment source | Admitting: Physician Assistant

## 2022-09-18 ENCOUNTER — Encounter (HOSPITAL_COMMUNITY): Payer: Self-pay | Admitting: Surgery

## 2022-09-18 DIAGNOSIS — G4733 Obstructive sleep apnea (adult) (pediatric): Secondary | ICD-10-CM | POA: Insufficient documentation

## 2022-09-18 DIAGNOSIS — Y832 Surgical operation with anastomosis, bypass or graft as the cause of abnormal reaction of the patient, or of later complication, without mention of misadventure at the time of the procedure: Secondary | ICD-10-CM | POA: Diagnosis not present

## 2022-09-18 DIAGNOSIS — I130 Hypertensive heart and chronic kidney disease with heart failure and stage 1 through stage 4 chronic kidney disease, or unspecified chronic kidney disease: Secondary | ICD-10-CM | POA: Insufficient documentation

## 2022-09-18 DIAGNOSIS — Z955 Presence of coronary angioplasty implant and graft: Secondary | ICD-10-CM | POA: Insufficient documentation

## 2022-09-18 DIAGNOSIS — E1122 Type 2 diabetes mellitus with diabetic chronic kidney disease: Secondary | ICD-10-CM

## 2022-09-18 DIAGNOSIS — D631 Anemia in chronic kidney disease: Secondary | ICD-10-CM | POA: Insufficient documentation

## 2022-09-18 DIAGNOSIS — Z992 Dependence on renal dialysis: Secondary | ICD-10-CM | POA: Diagnosis not present

## 2022-09-18 DIAGNOSIS — Z87891 Personal history of nicotine dependence: Secondary | ICD-10-CM | POA: Insufficient documentation

## 2022-09-18 DIAGNOSIS — I2510 Atherosclerotic heart disease of native coronary artery without angina pectoris: Secondary | ICD-10-CM | POA: Insufficient documentation

## 2022-09-18 DIAGNOSIS — I509 Heart failure, unspecified: Secondary | ICD-10-CM | POA: Diagnosis not present

## 2022-09-18 DIAGNOSIS — Z8546 Personal history of malignant neoplasm of prostate: Secondary | ICD-10-CM | POA: Insufficient documentation

## 2022-09-18 DIAGNOSIS — I132 Hypertensive heart and chronic kidney disease with heart failure and with stage 5 chronic kidney disease, or end stage renal disease: Secondary | ICD-10-CM

## 2022-09-18 DIAGNOSIS — E785 Hyperlipidemia, unspecified: Secondary | ICD-10-CM | POA: Insufficient documentation

## 2022-09-18 DIAGNOSIS — N186 End stage renal disease: Secondary | ICD-10-CM | POA: Diagnosis not present

## 2022-09-18 DIAGNOSIS — Z85528 Personal history of other malignant neoplasm of kidney: Secondary | ICD-10-CM | POA: Insufficient documentation

## 2022-09-18 DIAGNOSIS — I429 Cardiomyopathy, unspecified: Secondary | ICD-10-CM | POA: Insufficient documentation

## 2022-09-18 DIAGNOSIS — J45909 Unspecified asthma, uncomplicated: Secondary | ICD-10-CM | POA: Insufficient documentation

## 2022-09-18 DIAGNOSIS — J449 Chronic obstructive pulmonary disease, unspecified: Secondary | ICD-10-CM | POA: Diagnosis not present

## 2022-09-18 DIAGNOSIS — K219 Gastro-esophageal reflux disease without esophagitis: Secondary | ICD-10-CM | POA: Diagnosis not present

## 2022-09-18 DIAGNOSIS — Z79899 Other long term (current) drug therapy: Secondary | ICD-10-CM | POA: Insufficient documentation

## 2022-09-18 DIAGNOSIS — I5022 Chronic systolic (congestive) heart failure: Secondary | ICD-10-CM | POA: Insufficient documentation

## 2022-09-18 DIAGNOSIS — T82858A Stenosis of vascular prosthetic devices, implants and grafts, initial encounter: Secondary | ICD-10-CM | POA: Insufficient documentation

## 2022-09-18 DIAGNOSIS — N185 Chronic kidney disease, stage 5: Secondary | ICD-10-CM

## 2022-09-18 HISTORY — PX: AV FISTULA PLACEMENT: SHX1204

## 2022-09-18 HISTORY — DX: Dyspnea, unspecified: R06.00

## 2022-09-18 LAB — POCT I-STAT, CHEM 8
BUN: 27 mg/dL — ABNORMAL HIGH (ref 8–23)
Calcium, Ion: 0.91 mmol/L — ABNORMAL LOW (ref 1.15–1.40)
Chloride: 92 mmol/L — ABNORMAL LOW (ref 98–111)
Creatinine, Ser: 4 mg/dL — ABNORMAL HIGH (ref 0.61–1.24)
Glucose, Bld: 79 mg/dL (ref 70–99)
HCT: 45 % (ref 39.0–52.0)
Hemoglobin: 15.3 g/dL (ref 13.0–17.0)
Potassium: 4.5 mmol/L (ref 3.5–5.1)
Sodium: 131 mmol/L — ABNORMAL LOW (ref 135–145)
TCO2: 29 mmol/L (ref 22–32)

## 2022-09-18 LAB — POCT I-STAT 7, (LYTES, BLD GAS, ICA,H+H)
Acid-Base Excess: 0 mmol/L (ref 0.0–2.0)
Bicarbonate: 25.8 mmol/L (ref 20.0–28.0)
Calcium, Ion: 0.96 mmol/L — ABNORMAL LOW (ref 1.15–1.40)
HCT: 38 % — ABNORMAL LOW (ref 39.0–52.0)
Hemoglobin: 12.9 g/dL — ABNORMAL LOW (ref 13.0–17.0)
O2 Saturation: 97 %
Potassium: 3.9 mmol/L (ref 3.5–5.1)
Sodium: 131 mmol/L — ABNORMAL LOW (ref 135–145)
TCO2: 27 mmol/L (ref 22–32)
pCO2 arterial: 45.4 mmHg (ref 32–48)
pH, Arterial: 7.363 (ref 7.35–7.45)
pO2, Arterial: 94 mmHg (ref 83–108)

## 2022-09-18 LAB — GLUCOSE, CAPILLARY
Glucose-Capillary: 87 mg/dL (ref 70–99)
Glucose-Capillary: 93 mg/dL (ref 70–99)
Glucose-Capillary: 108 mg/dL — ABNORMAL HIGH (ref 70–99)

## 2022-09-18 SURGERY — INSERTION OF ARTERIOVENOUS (AV) GORE-TEX GRAFT ARM
Anesthesia: Regional | Site: Arm Upper | Laterality: Left

## 2022-09-18 MED ORDER — OXYCODONE HCL 5 MG PO TABS: 5.0000 mg | ORAL_TABLET | Freq: Once | ORAL | Status: DC | PRN

## 2022-09-18 MED ORDER — HEPARIN 6000 UNIT IRRIGATION SOLUTION
Status: DC | PRN
  Administered 2022-09-18: 1

## 2022-09-18 MED ORDER — SODIUM CHLORIDE 0.9 % IV SOLN: INTRAVENOUS | Status: DC | PRN

## 2022-09-18 MED ORDER — HEPARIN SODIUM (PORCINE) 1000 UNIT/ML IJ SOLN
2.1000 mL | Freq: Once | INTRAMUSCULAR | Status: AC
  Administered 2022-09-18: 2100 [IU] via INTRAVENOUS

## 2022-09-18 MED ORDER — OXYCODONE-ACETAMINOPHEN 5-325 MG PO TABS: 1.0000 | ORAL_TABLET | Freq: Four times a day (QID) | ORAL | 0 refills | Status: DC | PRN

## 2022-09-18 MED ORDER — ALBUMIN HUMAN 5 % IV SOLN
12.5000 g | Freq: Once | INTRAVENOUS | Status: AC
  Administered 2022-09-18: 12.5 g via INTRAVENOUS

## 2022-09-18 MED ORDER — OXYCODONE HCL 5 MG/5ML PO SOLN: 5.0000 mg | Freq: Once | ORAL | Status: DC | PRN

## 2022-09-18 MED ORDER — FENTANYL CITRATE (PF) 250 MCG/5ML IJ SOLN
INTRAMUSCULAR | Status: DC | PRN
  Administered 2022-09-18: 50 ug via INTRAVENOUS

## 2022-09-18 MED ORDER — BUPIVACAINE HCL (PF) 0.5 % IJ SOLN
INTRAMUSCULAR | Status: AC
  Filled 2022-09-18: qty 30

## 2022-09-18 MED ORDER — ACETAMINOPHEN 500 MG PO TABS
1000.0000 mg | ORAL_TABLET | Freq: Once | ORAL | Status: AC
  Administered 2022-09-18: 1000 mg via ORAL
  Filled 2022-09-18: qty 2

## 2022-09-18 MED ORDER — RACEPINEPHRINE HCL 2.25 % IN NEBU: INHALATION_SOLUTION | RESPIRATORY_TRACT | Status: DC | PRN

## 2022-09-18 MED ORDER — LIDOCAINE-EPINEPHRINE (PF) 1.5 %-1:200000 IJ SOLN
INTRAMUSCULAR | Status: DC | PRN
  Administered 2022-09-18: 30 mL via PERINEURAL

## 2022-09-18 MED ORDER — HEPARIN 6000 UNIT IRRIGATION SOLUTION
Status: AC
  Filled 2022-09-18: qty 500

## 2022-09-18 MED ORDER — MEPERIDINE HCL 25 MG/ML IJ SOLN: 6.2500 mg | INTRAMUSCULAR | Status: DC | PRN

## 2022-09-18 MED ORDER — LIDOCAINE-EPINEPHRINE (PF) 1 %-1:200000 IJ SOLN
INTRAMUSCULAR | Status: AC
  Filled 2022-09-18: qty 30

## 2022-09-18 MED ORDER — ALBUTEROL SULFATE HFA 108 (90 BASE) MCG/ACT IN AERS
INHALATION_SPRAY | RESPIRATORY_TRACT | Status: DC | PRN
  Administered 2022-09-18 (×2): 4 via RESPIRATORY_TRACT

## 2022-09-18 MED ORDER — ALBUMIN HUMAN 5 % IV SOLN
INTRAVENOUS | Status: AC
  Filled 2022-09-18: qty 250

## 2022-09-18 MED ORDER — EPHEDRINE 5 MG/ML INJ
INTRAVENOUS | Status: AC
  Filled 2022-09-18: qty 5

## 2022-09-18 MED ORDER — METOPROLOL SUCCINATE ER 25 MG PO TB24
ORAL_TABLET | ORAL | Status: AC
  Filled 2022-09-18: qty 1

## 2022-09-18 MED ORDER — LACTATED RINGERS IV SOLN: INTRAVENOUS | Status: DC

## 2022-09-18 MED ORDER — LIDOCAINE-EPINEPHRINE (PF) 1 %-1:200000 IJ SOLN
INTRAMUSCULAR | Status: DC | PRN
  Administered 2022-09-18: 28 mL

## 2022-09-18 MED ORDER — HEMOSTATIC AGENTS (NO CHARGE) OPTIME
TOPICAL | Status: DC | PRN
  Administered 2022-09-18: 1 via TOPICAL

## 2022-09-18 MED ORDER — PROPOFOL 500 MG/50ML IV EMUL
INTRAVENOUS | Status: DC | PRN
  Administered 2022-09-18: 20 ug/kg/min via INTRAVENOUS

## 2022-09-18 MED ORDER — 0.9 % SODIUM CHLORIDE (POUR BTL) OPTIME
TOPICAL | Status: DC | PRN
  Administered 2022-09-18: 1000 mL

## 2022-09-18 MED ORDER — HEPARIN SODIUM (PORCINE) 1000 UNIT/ML IJ SOLN
INTRAMUSCULAR | Status: DC | PRN
  Administered 2022-09-18: 3000 [IU] via INTRAVENOUS

## 2022-09-18 MED ORDER — FENTANYL CITRATE (PF) 250 MCG/5ML IJ SOLN
INTRAMUSCULAR | Status: AC
  Filled 2022-09-18: qty 5

## 2022-09-18 MED ORDER — PROMETHAZINE HCL 25 MG/ML IJ SOLN: 6.2500 mg | INTRAMUSCULAR | Status: DC | PRN

## 2022-09-18 MED ORDER — NOREPINEPHRINE 4 MG/250ML-% IV SOLN
INTRAVENOUS | Status: AC
  Filled 2022-09-18: qty 250

## 2022-09-18 MED ORDER — ORAL CARE MOUTH RINSE: 15.0000 mL | Freq: Once | OROMUCOSAL | Status: AC

## 2022-09-18 MED ORDER — BUPIVACAINE LIPOSOME 1.3 % IJ SUSP
INTRAMUSCULAR | Status: AC
  Filled 2022-09-18: qty 20

## 2022-09-18 MED ORDER — ALBUTEROL SULFATE (2.5 MG/3ML) 0.083% IN NEBU
2.5000 mg | INHALATION_SOLUTION | Freq: Once | RESPIRATORY_TRACT | Status: AC
  Filled 2022-09-18: qty 3

## 2022-09-18 MED ORDER — CHLORHEXIDINE GLUCONATE 0.12 % MT SOLN: 15.0000 mL | Freq: Once | OROMUCOSAL | Status: AC

## 2022-09-18 MED ORDER — MIDAZOLAM HCL 2 MG/2ML IJ SOLN: 0.5000 mg | Freq: Once | INTRAMUSCULAR | Status: DC | PRN

## 2022-09-18 MED ORDER — ALBUTEROL SULFATE (2.5 MG/3ML) 0.083% IN NEBU
INHALATION_SOLUTION | RESPIRATORY_TRACT | Status: AC
  Filled 2022-09-18: qty 3

## 2022-09-18 MED ORDER — DEXMEDETOMIDINE HCL IN NACL 400 MCG/100ML IV SOLN
INTRAVENOUS | Status: DC | PRN
  Administered 2022-09-18: .5 ug/kg/h via INTRAVENOUS

## 2022-09-18 MED ORDER — CEFAZOLIN SODIUM-DEXTROSE 2-3 GM-%(50ML) IV SOLR
INTRAVENOUS | Status: DC | PRN
  Administered 2022-09-18: 2 g via INTRAVENOUS

## 2022-09-18 MED ORDER — DEXMEDETOMIDINE HCL IN NACL 400 MCG/100ML IV SOLN
INTRAVENOUS | Status: AC
  Filled 2022-09-18: qty 100

## 2022-09-18 MED ORDER — ALBUTEROL SULFATE (2.5 MG/3ML) 0.083% IN NEBU
INHALATION_SOLUTION | RESPIRATORY_TRACT | Status: AC
  Administered 2022-09-18: 2.5 mg
  Filled 2022-09-18: qty 3

## 2022-09-18 MED ORDER — FENTANYL CITRATE (PF) 100 MCG/2ML IJ SOLN: 25.0000 ug | INTRAMUSCULAR | Status: DC | PRN

## 2022-09-18 MED ORDER — CHLORHEXIDINE GLUCONATE 0.12 % MT SOLN
OROMUCOSAL | Status: AC
  Administered 2022-09-18: 15 mL via OROMUCOSAL
  Filled 2022-09-18: qty 15

## 2022-09-18 MED ORDER — METOPROLOL SUCCINATE ER 25 MG PO TB24: 25.0000 mg | ORAL_TABLET | Freq: Once | ORAL | Status: AC

## 2022-09-18 SURGICAL SUPPLY — 34 items
ARMBAND PINK RESTRICT EXTREMIT (MISCELLANEOUS) ×2 IMPLANT
BAG COUNTER SPONGE SURGICOUNT (BAG) ×1 IMPLANT
CANISTER SUCT 3000ML PPV (MISCELLANEOUS) ×1 IMPLANT
CLIP VESOCCLUDE MED 6/CT (CLIP) ×1 IMPLANT
CLIP VESOCCLUDE SM WIDE 6/CT (CLIP) ×1 IMPLANT
DERMABOND ADVANCED .7 DNX12 (GAUZE/BANDAGES/DRESSINGS) ×1 IMPLANT
ELECT REM PT RETURN 9FT ADLT (ELECTROSURGICAL) ×1
ELECTRODE REM PT RTRN 9FT ADLT (ELECTROSURGICAL) ×1 IMPLANT
GLOVE SURG SS PI 7.5 STRL IVOR (GLOVE) ×3 IMPLANT
GOWN STRL REUS W/ TWL LRG LVL3 (GOWN DISPOSABLE) ×2 IMPLANT
GOWN STRL REUS W/ TWL XL LVL3 (GOWN DISPOSABLE) ×1 IMPLANT
GOWN STRL REUS W/TWL LRG LVL3 (GOWN DISPOSABLE) ×2
GOWN STRL REUS W/TWL XL LVL3 (GOWN DISPOSABLE) ×1
GRAFT GORETEX STRT 4-7X45 (Vascular Products) IMPLANT
HEMOSTAT SNOW SURGICEL 2X4 (HEMOSTASIS) IMPLANT
KIT BASIN OR (CUSTOM PROCEDURE TRAY) ×1 IMPLANT
KIT TURNOVER KIT B (KITS) ×1 IMPLANT
NDL 18GX1X1/2 (RX/OR ONLY) (NEEDLE) ×1 IMPLANT
NEEDLE 18GX1X1/2 (RX/OR ONLY) (NEEDLE) ×1 IMPLANT
NS IRRIG 1000ML POUR BTL (IV SOLUTION) ×1 IMPLANT
PACK CV ACCESS (CUSTOM PROCEDURE TRAY) ×1 IMPLANT
PAD ARMBOARD 7.5X6 YLW CONV (MISCELLANEOUS) ×2 IMPLANT
SLING ARM FOAM STRAP LRG (SOFTGOODS) IMPLANT
SLING ARM FOAM STRAP MED (SOFTGOODS) IMPLANT
SURGIFLO W/THROMBIN 8M KIT (HEMOSTASIS) IMPLANT
SUT PROLENE 6 0 BV (SUTURE) ×2 IMPLANT
SUT VIC AB 3-0 SH 27 (SUTURE) ×2
SUT VIC AB 3-0 SH 27X BRD (SUTURE) ×2 IMPLANT
SUT VICRYL 4-0 PS2 18IN ABS (SUTURE) IMPLANT
SYR 30ML LL (SYRINGE) ×1 IMPLANT
SYR TOOMEY 50ML (SYRINGE) IMPLANT
TOWEL GREEN STERILE (TOWEL DISPOSABLE) ×1 IMPLANT
UNDERPAD 30X36 HEAVY ABSORB (UNDERPADS AND DIAPERS) ×1 IMPLANT
WATER STERILE IRR 1000ML POUR (IV SOLUTION) ×1 IMPLANT

## 2022-09-18 NOTE — Discharge Instructions (Signed)
   [facility]  Discharge Instructions  AV Fistula or Graft Surgery for Dialysis Access  Please refer to the following instructions for your post-procedure care. Your surgeon or physician assistant will discuss any changes with you.  Activity  You may drive the day following your surgery, if you are comfortable and no longer taking prescription pain medication. Resume full activity as the soreness in your incision resolves.  Bathing/Showering  You may shower after you go home. Keep your incision dry for 48 hours. Do not soak in a bathtub, hot tub, or swim until the incision heals completely. You may not shower if you have a hemodialysis catheter.  Incision Care  Clean your incision with mild soap and water after 48 hours. Pat the area dry with a clean towel. You do not need a bandage unless otherwise instructed. Do not apply any ointments or creams to your incision. You may have skin glue on your incision. Do not peel it off. It will come off on its own in about one week. Your arm may swell a bit after surgery. To reduce swelling use pillows to elevate your arm so it is above your heart. Your doctor will tell you if you need to lightly wrap your arm with an ACE bandage.  Diet  Resume your normal diet. There are not special food restrictions following this procedure. In order to heal from your surgery, it is CRITICAL to get adequate nutrition. Your body requires vitamins, minerals, and protein. Vegetables are the best source of vitamins and minerals. Vegetables also provide the perfect balance of protein. Processed food has little nutritional value, so try to avoid this.  Medications  Resume taking all of your medications. If your incision is causing pain, you may take over-the counter pain relievers such as acetaminophen (Tylenol). If you were prescribed a stronger pain medication, please be aware these medications can cause nausea and constipation. Prevent  nausea by taking the medication with a snack or meal. Avoid constipation by drinking plenty of fluids and eating foods with high amount of fiber, such as fruits, vegetables, and grains.  Do not take Tylenol if you are taking prescription pain medications.  Follow up Your surgeon may want to see you in the office following your access surgery. If so, this will be arranged at the time of your surgery.  Please call us immediately for any of the following conditions:  Increased pain, redness, drainage (pus) from your incision site Fever of 101 degrees or higher Severe or worsening pain at your incision site Hand pain or numbness.  Reduce your risk of vascular disease:  Stop smoking. If you would like help, call [facility] at 1-800-QUIT-NOW (805) 496-9703) or [place] at [place] your cholesterol Maintain a desired weight Control your diabetes Keep your blood pressure down  Dialysis  It will take several weeks to several months for your new dialysis access to be ready for use. Your surgeon will determine when it is okay to use it. Your nephrologist will continue to direct your dialysis. You can continue to use your Permcath until your new access is ready for use.   09/18/2022 [name] [id number] 03-Oct-1940  Surgeon(s): Serafina Mitchell, MD  Procedure(s): INSERTION OF LEFT ARM ARTERIOVENOUS (AV) GORE-TEX GRAFT  x Do not stick graft for 4 weeks    If you have any questions, please call the office at [phone].

## 2022-09-18 NOTE — Progress Notes (Signed)
Notified Dr.Jackson that patient had not taken his 25 mg Toprol XL this am. . Also told her pt's'm BP 98/75 p-74 and 102/74 p-74. Order for One time dose of Toprol XL 25 mg received and administered.

## 2022-09-18 NOTE — Anesthesia Procedure Notes (Signed)
Anesthesia Regional Block: Supraclavicular block   Pre-Anesthetic Checklist: , timeout performed,  Correct Patient, Correct Site, Correct Laterality,  Correct Procedure, Correct Position, site marked,  Risks and benefits discussed,  Surgical consent,  Pre-op evaluation,  At surgeon's request and post-op pain management  Laterality: Left and Upper  Prep: chloraprep       Needles:  Injection technique: Single-shot  Needle Type: Echogenic Needle     Needle Length: 9cm  Needle Gauge: 21     Additional Needles:   Procedures:,,,, ultrasound used (permanent image in chart),,    Narrative:  Start time: 09/18/2022 7:40 AM End time: 09/18/2022 7:46 AM Injection made incrementally with aspirations every 5 mL.  Performed by: Personally  Anesthesiologist: Annye Asa, MD  Additional Notes: Pt identified in Holding room.  Monitors applied. Working IV access confirmed. Sterile prep L clavicle and neck.  #21ga ECHOgenic Arrow block needle to supraclavicular brachial plexus with US guidance.  30cc 1.5% Lidocaine 1:200k epi injected incrementally after negative test dose.  Patient asymptomatic, VSS, no heme aspirated, tolerated well.   Jenita Seashore, MD

## 2022-09-18 NOTE — Interval H&P Note (Signed)
History and Physical Interval Note:  09/18/2022 7:34 AM  [name].  has presented today for surgery, with the diagnosis of ESRD.  The various methods of treatment have been discussed with the patient and family. After consideration of risks, benefits and other options for treatment, the patient has consented to  Procedure(s): INSERTION OF LEFT ARM ARTERIOVENOUS (AV) GORE-TEX GRAFT (Left) as a surgical intervention.  The patient's history has been reviewed, patient examined, no change in status, stable for surgery.  I have reviewed the patient's chart and labs.  Questions were answered to the patient's satisfaction.     Annamarie Major

## 2022-09-18 NOTE — Transfer of Care (Signed)
Immediate Anesthesia Transfer of Care Note  Patient: [name].  Procedure(s) Performed: INSERTION OF LEFT ARM ARTERIOVENOUS (AV) GORE-TEX GRAFT (Left: Arm Upper)  Patient Location: PACU  Anesthesia Type:MAC and Regional  Level of Consciousness: drowsy and patient cooperative  Airway & Oxygen Therapy: Patient Spontanous Breathing and Patient connected to face mask oxygen  Post-op Assessment: Report given to RN, Post -op Vital signs reviewed and stable, and Patient moving all extremities  Post vital signs: Reviewed and stable  Last Vitals:  Vitals Value Taken Time  BP 92/60 09/18/22 1000  Temp 36.5 C 09/18/22 1000  Pulse 65 09/18/22 1006  Resp 36 09/18/22 1006  SpO2 98 % 09/18/22 1006  Vitals shown include unvalidated device data.  Last Pain:  Vitals:   09/18/22 0608  TempSrc: Oral  PainSc: 0-No pain         Complications: No notable events documented.

## 2022-09-18 NOTE — Op Note (Signed)
    Patient name: [name]. MRN: [id number] DOB: 12/21/1940 Sex: male  09/18/2022 Pre-operative Diagnosis: ESRD Post-operative diagnosis:  Same Surgeon:  Annamarie Major Assistants:  Leontine Locket, PA Procedure:   Left upper extremity dialysis graft (4 x 7) Anesthesia:  Regional Blood Loss:  minimal Specimens:  none  Findings: Vein anastomosis was end to side.  The patient had a palpable left radial pulse, however the inflow was not as good as I expected.  If he has trouble with this graft, formal arterial evaluation would be recommended.  Indications: This is an 81 year old gentleman who comes in today for new upper arm dialysis graft.  His original procedure was canceled secondary to cocaine utilization.  Procedure:  The patient was identified in the holding area and taken to [facility] [address]  The patient was then placed supine on the table. regional anesthesia was administered.  The patient was prepped and draped in the usual sterile fashion.  A time out was called and antibiotics were administered.  A PA was necessary to expedite the procedure and assist with technical details.  She provided exposure via suction and retraction.  She also help with suture following for the anastomosis.  A longitudinal incision was made just proximal to the antecubital crease.  Cautery was used divide subcutaneous tissue.  The brachial artery was then dissected out sharply.  This was a 4 mm disease-free artery.  It was encircled proximally distally with Vesseloops.  Next a longitudinal incision was made up near the axilla.  Through this incision the axillary vein was exposed.  Lidocaine was then used within the subcutaneous tissue and a curved tunneler was used to create a tunnel and a 4 7 stretch graft was brought through the tunnel.  The patient was given 3000 units of heparin.  After the heparin circulated, the brachial artery was occluded.  A #11 blade was used to make an arteriotomy which was extended  longitudinally with Potts scissors.  The graft was then beveled to fit the size the arteriotomy in a running anastomosis was created with 6-0 Prolene.  Prior to completion, the appropriate flushing maneuvers were performed and the anastomosis was completed.  There was pulsatile flow through the graft.  The graft was then reoccluded and flushed with heparin saline.  The axillary vein was then occluded and a #11 blade was used to make a venotomy which was extended longitudinally with Potts scissors.  The graft was cut to the appropriate length and beveled to fit the size the venotomy.  A running anastomosis was created with 6-0 Prolene.  This was done in an end-to-side fashion.  Prior to completion the appropriate flushing maneuvers were performed and the anastomosis was completed.  There was a palpable thrill within the graft as well as a palpable radial pulse, both of which were confirmed with a hand-held Doppler.  Surgiflo was placed in the incisions.  Hemostasis was achieved.  The incisions were then closed with 2 layers of 3-0 Vicryl followed by Dermabond.  There were no immediate complications.   Disposition: To PACU stable.   Theotis Burrow, M.D., [facility] [place] Office: [phone] Pager:  [phone]

## 2022-09-18 NOTE — H&P (Signed)
 [facility] [place]  Patient name: Bernard E Cowie Jr. MRN: [id number] DOB: 05/17/1941 Sex: male   REQUESTING PROVIDER:    Dr. Abdullah   REASON FOR CONSULT:    Dialysis acces  HISTORY OF PRESENT ILLNESS:   Bernard E Furgeson Jr. is a 81 y.o. male, who is for dialysis access.  He has a history of a left forearm graft placed by Dr. Early in 2020 which is now occluded.  He has a catheter in place.  He dialyzes on Tuesday Thursday Saturday.  Patient has a history of coronary artery disease.  He is a type II diabetic.  He is medically managed for hypertension.  He is a former smoker.  PAST MEDICAL HISTORY    Past Medical History:  Diagnosis Date   Chronic kidney disease    renal insufficiency   Coronary artery disease    Diabetes mellitus    type 2-non-insulin   GERD (gastroesophageal reflux disease)    Gout    Hypertension    Obesity    Prostate cancer (HCC)    Tobacco abuse counseling      FAMILY HISTORY   Family History  Problem Relation Age of Onset   Aneurysm Mother    Multiple myeloma Mother    Aneurysm Father    Cancer Brother    Prostate cancer Brother    Cancer Other    Prostate cancer Brother    Bone cancer Brother    Breast cancer Neg Hx    Colon cancer Neg Hx    Pancreatic cancer Neg Hx     SOCIAL HISTORY:   Social History   Socioeconomic History   Marital status: Married    Spouse name: Not on file   Number of children: 3   Years of education: Not on file   Highest education level: Not on file  Occupational History   Occupation: retired  Tobacco Use   Smoking status: Former    Packs/day: 0.50    Years: 50.00    Total pack years: 25.00    Types: Cigarettes    Quit date: 01/30/2022    Years since quitting: 0.4   Smokeless tobacco: Never   Tobacco comments:    3 cigs a day-05/02/2021  Vaping Use   Vaping Use: Never used  Substance and Sexual Activity   Alcohol use: Not  Currently    Comment: drank 5 beers 03-28-17   Drug use: Not Currently    Types: Cocaine, Marijuana   Sexual activity: Yes  Other Topics Concern   Not on file  Social History Narrative   Not on file   Social Determinants of Health   Financial Resource Strain: Not on file  Food Insecurity: Not on file  Transportation Needs: Not on file  Physical Activity: Not on file  Stress: Not on file  Social Connections: Not on file  Intimate Partner Violence: Not on file    ALLERGIES:    No Known Allergies  CURRENT MEDICATIONS:    Current Outpatient Medications  Medication Sig Dispense Refill   albuterol (VENTOLIN HFA) 108 (90 Base) MCG/ACT inhaler Inhale 2 puffs into the lungs every 4 (four) hours as needed for shortness of breath. (Patient taking differently: Inhale 2 puffs into the lungs every 4 (four) hours as needed for shortness of breath or wheezing.) 18 g 5   amLODipine (NORVASC) 5 MG tablet Take 5 mg by mouth daily.     aspirin EC 81 MG tablet Take 81 mg   by mouth at bedtime.     atorvastatin (LIPITOR) 40 MG tablet Take 40 mg by mouth at bedtime.     DOCUSATE SODIUM PO Take 240 mg by mouth daily.     Ensure (ENSURE) Take 237 mLs by mouth daily. Dialysis formula needed     hydrALAZINE (APRESOLINE) 10 MG tablet Take 10 mg by mouth 2 (two) times daily.     losartan (COZAAR) 100 MG tablet Take 100 mg by mouth daily.     omeprazole (PRILOSEC) 20 MG capsule Take 20 mg by mouth daily.     terazosin (HYTRIN) 5 MG capsule Take 5 mg by mouth at bedtime.     Tiotropium Bromide-Olodaterol (STIOLTO RESPIMAT) 2.5-2.5 MCG/ACT AERS Inhale 2 puffs into the lungs daily. (Patient taking differently: Inhale 2 puffs into the lungs 2 (two) times daily.) 1 each 11   carvedilol (COREG) 3.125 MG tablet Take 1 tablet (3.125 mg total) by mouth 2 (two) times daily with a meal. (Patient not taking: Reported on 04/08/2022) 60 tablet 0   No current facility-administered medications for this visit.    REVIEW  OF SYSTEMS:   [X] denotes positive finding, [ ] denotes negative finding Cardiac  Comments:  Chest pain or chest pressure:    Shortness of breath upon exertion:    Short of breath when lying flat:    Irregular heart rhythm:        Vascular    Pain in calf, thigh, or hip brought on by ambulation:    Pain in feet at night that wakes you up from your sleep:     Blood clot in your veins:    Leg swelling:         Pulmonary    Oxygen at home:    Productive cough:     Wheezing:         Neurologic    Sudden weakness in arms or legs:     Sudden numbness in arms or legs:     Sudden onset of difficulty speaking or slurred speech:    Temporary loss of vision in one eye:     Problems with dizziness:         Gastrointestinal    Blood in stool:      Vomited blood:         Genitourinary    Burning when urinating:     Blood in urine:        Psychiatric    Major depression:         Hematologic    Bleeding problems:    Problems with blood clotting too easily:        Skin    Rashes or ulcers:        Constitutional    Fever or chills:     PHYSICAL EXAM:   Vitals:   07/15/22 1144  BP: 102/75  Pulse: 83  Resp: (!) 24  Temp: 97.7 F (36.5 C)  SpO2: 98%  Weight: 176 lb (79.8 kg)  Height: 5' 8" (1.727 m)    GENERAL: The patient is a well-nourished male, in no acute distress. The vital signs are documented above. CARDIAC: There is a regular rate and rhythm.  VASCULAR: Palpable left  brachial pulse and I evaluated the axillary vein with SonoSite and it is widely patent and easily compressible. PULMONARY: Nonlabored respirations MUSCULOSKELETAL: There are no major deformities or cyanosis. NEUROLOGIC: Left-sided weakness  SKIN: There are no ulcers or rashes noted. PSYCHIATRIC: The patient has a normal   affect.  STUDIES:   I have reviewed the following studies:   +-----------------+-------------+----------+--------------+  Right Cephalic   Diameter (cm)Depth (cm)    Findings     +-----------------+-------------+----------+--------------+  Shoulder             0.21                               +-----------------+-------------+----------+--------------+  Prox upper arm    0.21 / 0.16                           +-----------------+-------------+----------+--------------+  Mid upper arm                           not visualized  +-----------------+-------------+----------+--------------+  Dist upper arm                          not visualized  +-----------------+-------------+----------+--------------+  Antecubital fossa  0.21 / NV              branching     +-----------------+-------------+----------+--------------+  Prox forearm      0.16 / 0.14                           +-----------------+-------------+----------+--------------+  Mid forearm       0.09 / 0.20                           +-----------------+-------------+----------+--------------+  Dist forearm         0.15                    NWV        +-----------------+-------------+----------+--------------+   +-----------------+-------------+----------+----------------------------+  Right Basilic    Diameter (cm)Depth (cm)          Findings            +-----------------+-------------+----------+----------------------------+  Prox upper arm       0.31                          joins              +-----------------+-------------+----------+----------------------------+  Mid upper arm        0.20                                             +-----------------+-------------+----------+----------------------------+  Dist upper arm       0.21                                             +-----------------+-------------+----------+----------------------------+  Antecubital fossa                       not visualized and branching  +-----------------+-------------+----------+----------------------------+  Prox forearm                                    not visualized         +-----------------+-------------+----------+----------------------------+   +-----------------+-------------+----------+----------------------+    Left Cephalic    Diameter (cm)Depth (cm)       Findings         +-----------------+-------------+----------+----------------------+  Prox upper arm                              not visualized      +-----------------+-------------+----------+----------------------+  Mid upper arm                               not visualized      +-----------------+-------------+----------+----------------------+  Dist upper arm       0.14                                       +-----------------+-------------+----------+----------------------+  Antecubital fossa                       not visualized / graft  +-----------------+-------------+----------+----------------------+  Prox forearm                                  loop graft        +-----------------+-------------+----------+----------------------+  Mid forearm                                   loop graft        +-----------------+-------------+----------+----------------------+   +-----------------+-------------+----------+--------------+  Left Basilic     Diameter (cm)Depth (cm)   Findings     +-----------------+-------------+----------+--------------+  Prox upper arm                          not visualized  +-----------------+-------------+----------+--------------+  Mid upper arm                           not visualized  +-----------------+-------------+----------+--------------+  Dist upper arm                          not visualized  +-----------------+-------------+----------+--------------+  Antecubital fossa                       not visualized  +-----------------+-------------+----------+--------------+  ASSESSMENT and PLAN   End-stage renal disease: The patient has a failed left forearm graft.  His next  option would be a left upper arm graft.  I used the sono site to evaluate the axillary vein.  There are no stents up in this area.  I discussed proceeding with a left upper arm graft.  This will be done on a nondialysis day.  The risk and benefits of the operation were discussed with the patient.  All questions were answered.   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  His next option would be a left upper arm graft.  I used the sono site to evaluate the axillary vein.  There are no stents up in this area.  I discussed proceeding with a left upper arm graft.  This will be done on a nondialysis day.  The risk and benefits of the operation were discussed with the patient.  All questions were answered.     Leia Alf, MD, FACS [facility] [phone] Pager [phone]

## 2022-09-18 NOTE — Progress Notes (Signed)
RT called to PACU due to patient needing to go on bipap.  Upon arrival, patient was finishing a breathing treatment and noted to have prolonged expiratory phase and lethargic but arousal to name.  Patient was placed on bipap and is tolerating well at this time.  Will obtain ABG per protocol.  Will continue to monitor.

## 2022-09-18 NOTE — Progress Notes (Signed)
Paged Dr. Trula Slade for consent orders prior to pt being blocked.

## 2022-09-18 NOTE — Progress Notes (Signed)
Notified Dr. Nyoka Cowden shortly after pt's arrival that pt was wheezing and had not used his albuterol inhaler this am. Dr. Nyoka Cowden at bedside to see patient. Albuterol nebulizer ordered and administered.

## 2022-09-18 NOTE — Progress Notes (Signed)
ABG obtained on bipap settings of IPAP: 15, EPAP: 5, backup RR: 18, and FIO2: 40%.  No further changes at this time.  Results given to MD.  Will continue to monitor.   Latest Reference Range & Units 09/18/22 10:36  Sample type  ARTERIAL  pH, Arterial 7.35 - 7.45  7.363  pCO2 arterial 32 - 48 mmHg 45.4  pO2, Arterial 83 - 108 mmHg 94  TCO2 22 - 32 mmol/L 27  Acid-Base Excess 0.0 - 2.0 mmol/L 0.0  Bicarbonate 20.0 - 28.0 mmol/L 25.8  O2 Saturation % 97

## 2022-09-19 ENCOUNTER — Encounter (HOSPITAL_COMMUNITY): Payer: Self-pay | Admitting: Surgery

## 2022-09-20 NOTE — Anesthesia Postprocedure Evaluation (Signed)
Anesthesia Post Note  Patient: [name].  Procedure(s) Performed: INSERTION OF LEFT ARM ARTERIOVENOUS (AV) GORE-TEX GRAFT (Left: Arm Upper)     Patient location during evaluation: PACU Anesthesia Type: Regional Level of consciousness: awake Pain management: pain level controlled Vital Signs Assessment: post-procedure vital signs reviewed and stable Respiratory status: spontaneous breathing, nonlabored ventilation and respiratory function stable Cardiovascular status: blood pressure returned to baseline and stable Postop Assessment: no apparent nausea or vomiting Anesthetic complications: no   No notable events documented.  Last Vitals:  Vitals:   09/18/22 1145 09/18/22 1200  BP: 94/72   Pulse: 66 67  Resp: (!) 21 (!) 23  Temp:    SpO2: 94% 94%    Last Pain:  Vitals:   09/18/22 1200  TempSrc:   PainSc: 0-No pain                 Tracia Lacomb P Pinchas Reither

## 2022-09-26 ENCOUNTER — Telehealth: Payer: Self-pay | Admitting: *Deleted

## 2022-09-26 NOTE — Telephone Encounter (Signed)
Morey Hummingbird, nurse with [facility] called stating patient was in clinic and AVG is without thrill. I spoke to Dr Trula Slade noting the patient needs to be seen with an ultrasound prior.  An appointment was scheduled 09/30/2022.  Morey Hummingbird was notified with date and time.

## 2022-09-27 ENCOUNTER — Other Ambulatory Visit: Payer: Self-pay | Admitting: *Deleted

## 2022-09-27 DIAGNOSIS — N186 End stage renal disease: Secondary | ICD-10-CM

## 2022-09-30 ENCOUNTER — Ambulatory Visit (HOSPITAL_COMMUNITY)
Admission: RE | Admit: 2022-09-30 | Discharge: 2022-09-30 | Disposition: A | Discharge status: Home / Self Care | Payer: No Typology Code available for payment source | Source: Ambulatory Visit | Attending: Surgery | Admitting: Surgery

## 2022-09-30 ENCOUNTER — Other Ambulatory Visit: Payer: Self-pay | Admitting: Surgery

## 2022-09-30 ENCOUNTER — Other Ambulatory Visit: Payer: Self-pay

## 2022-09-30 ENCOUNTER — Ambulatory Visit: Payer: No Typology Code available for payment source | Admitting: Surgery

## 2022-09-30 ENCOUNTER — Ambulatory Visit (INDEPENDENT_AMBULATORY_CARE_PROVIDER_SITE_OTHER): Payer: No Typology Code available for payment source | Admitting: Physician Assistant

## 2022-09-30 VITALS — BP 106/76 | HR 97 | Temp 97.5°F | Resp 20 | Ht 68.0 in

## 2022-09-30 DIAGNOSIS — N186 End stage renal disease: Secondary | ICD-10-CM

## 2022-09-30 DIAGNOSIS — T82868A Thrombosis of vascular prosthetic devices, implants and grafts, initial encounter: Secondary | ICD-10-CM

## 2022-09-30 DIAGNOSIS — Z992 Dependence on renal dialysis: Secondary | ICD-10-CM

## 2022-09-30 NOTE — Progress Notes (Signed)
POST OPERATIVE OFFICE NOTE    CC:  F/u for surgery  HPI:  This is a 82 y.o. male who is s/p left UE AV graft 09/18/22 by DR. Brabham.   He has a previous loop graft left forearm by Dr. Donnetta Hutching 3 years ago.  He does have a TDC for HD currently. He reports no thrill in the graft.  He denies pain, loss of motor and loss of sensation in the left UE.    Surgical Findings: Vein anastomosis was end to side.  The patient had a palpable left radial pulse, however the inflow was not as good as I expected.  If he has trouble with this graft, formal arterial evaluation would be recommended.    Of note he is a Cocaine uses.     No Known Allergies  Current Outpatient Medications  Medication Sig Dispense Refill   albuterol (VENTOLIN HFA) 108 (90 Base) MCG/ACT inhaler Inhale 2 puffs into the lungs every 4 (four) hours as needed for shortness of breath. 18 g 5   atorvastatin (LIPITOR) 40 MG tablet Take 40 mg by mouth at bedtime.     cinacalcet (SENSIPAR) 30 MG tablet Take 30 mg by mouth daily.     docusate calcium (SURFAK) 240 MG capsule Take 240 mg by mouth daily.     Ensure (ENSURE) Take 237 mLs by mouth daily. Dialysis formula needed     losartan (COZAAR) 50 MG tablet Take 25 mg by mouth daily.     metoprolol succinate (TOPROL-XL) 25 MG 24 hr tablet Take 25 mg by mouth daily.     oxyCODONE-acetaminophen (PERCOCET) 5-325 MG tablet Take 1 tablet by mouth every 6 (six) hours as needed for severe pain. 20 tablet 0   Tiotropium Bromide-Olodaterol (STIOLTO RESPIMAT) 2.5-2.5 MCG/ACT AERS Inhale 2 puffs into the lungs daily. (Patient taking differently: Inhale 2 puffs into the lungs 2 (two) times daily.) 1 each 11   No current facility-administered medications for this visit.     ROS:  See HPI  Physical Exam:  Findings:       +--------------------+----------+-----------------+--------+  AVG                PSV (cm/s)Flow Vol (mL/min)Describe   +--------------------+----------+-----------------+--------+  Native artery inflow    33                               +--------------------+----------+-----------------+--------+  Arterial anastomosis                           occluded  +--------------------+----------+-----------------+--------+  Prox graft                                     occluded  +--------------------+----------+-----------------+--------+  Mid graft                                      occluded  +--------------------+----------+-----------------+--------+  Distal graft                                   occluded  +--------------------+----------+-----------------+--------+  Venous anastomosis  occluded  +--------------------+----------+-----------------+--------+  Venous outflow                                 occluded  +--------------------+----------+-----------------+--------+  Complex area posterior to AVG in proximal and mid segment, suggestive of  possible hematoma.     Summary:  Occluded left upper extremity arteriovenous graft.    *See table(s) above for measurements and observations.    Diagnosing physician: Harold Barban MD   ---------------------------------------------------------------------------  -----     Preliminary     Incision:  well healed Extremities:  palpable radial pulse, no palpable thrill in the UE AV graft Neuro: sensation intact Lungs:  productive cough.    Assessment/Plan:  This is a 83 y.o. male who is s/p:left UE AV graft that is occluded.  An arterial duplex was performed today revealing triphasic wave forms.  There was no subclavian lesion note on duplex.  Good arterial inflow on arterial duplex.  We will schedule him for left UE AV graft thrombectomy on Weds. 10/02/21 by Dr. Trula Slade.  He agrees with this plan HD is on TTS.    Due to computer issues in the vascular lab today the arterial duplex has not  been received, but verbally corresponded.    Roxy Horseman PA-C [facility] [phone]   Clinic MD:  Trula Slade

## 2022-10-01 ENCOUNTER — Encounter (HOSPITAL_COMMUNITY): Payer: Self-pay | Admitting: Surgery

## 2022-10-01 ENCOUNTER — Telehealth: Payer: Self-pay

## 2022-10-01 NOTE — Progress Notes (Addendum)
PCP - [facility]uret[facility]  Cardiologist - [facility]  Pulmonology - [facility]  Chest x-ray - 09/11/22 EKG - 09/11/22 Stress Test - 04/21/19 ECHO - 05/31/22 CE Cardiac Cath - 04/23/19  ICD Pacemaker/Loop - n/a  Sleep Study -  n/a CPAP - Yes  Diabetes Type 2, no medications  Anesthesia review: Yes

## 2022-10-01 NOTE — Telephone Encounter (Signed)
Attempted to reach patient to clarify the confusion on his arrival time for surgery on tomorrow, which is 1:00pm, as indicated on his surgery instructions letter provided when scheduled in office. No answer reached at number and voicemail full.

## 2022-10-01 NOTE — Telephone Encounter (Signed)
A nurse from [facility] called to let us know she spoke with pt earlier today who thinks his surgery is tomorrow evening. I have attempted to reach pt to clarify time he is due to arrive at hospital tomorrow. His VM box is full. Will attempt to reach him again shortly.

## 2022-10-02 ENCOUNTER — Encounter (HOSPITAL_COMMUNITY): Payer: Self-pay | Admitting: Surgery

## 2022-10-02 ENCOUNTER — Ambulatory Visit (HOSPITAL_COMMUNITY): Payer: No Typology Code available for payment source | Admitting: Physician Assistant

## 2022-10-02 ENCOUNTER — Ambulatory Visit (HOSPITAL_COMMUNITY)
Admission: RE | Admit: 2022-10-02 | Discharge: 2022-10-02 | Disposition: A | Discharge status: Home / Self Care | Payer: No Typology Code available for payment source | Source: Ambulatory Visit | Attending: Surgery | Admitting: Surgery

## 2022-10-02 ENCOUNTER — Other Ambulatory Visit: Payer: Self-pay

## 2022-10-02 ENCOUNTER — Encounter (HOSPITAL_COMMUNITY)
Admission: RE | Disposition: A | Discharge status: Home / Self Care | Payer: Self-pay | Source: Ambulatory Visit | Attending: Surgery

## 2022-10-02 DIAGNOSIS — E1122 Type 2 diabetes mellitus with diabetic chronic kidney disease: Secondary | ICD-10-CM | POA: Insufficient documentation

## 2022-10-02 DIAGNOSIS — Z79899 Other long term (current) drug therapy: Secondary | ICD-10-CM | POA: Insufficient documentation

## 2022-10-02 DIAGNOSIS — J449 Chronic obstructive pulmonary disease, unspecified: Secondary | ICD-10-CM | POA: Insufficient documentation

## 2022-10-02 DIAGNOSIS — Z87891 Personal history of nicotine dependence: Secondary | ICD-10-CM | POA: Insufficient documentation

## 2022-10-02 DIAGNOSIS — Z539 Procedure and treatment not carried out, unspecified reason: Secondary | ICD-10-CM | POA: Diagnosis not present

## 2022-10-02 DIAGNOSIS — Y832 Surgical operation with anastomosis, bypass or graft as the cause of abnormal reaction of the patient, or of later complication, without mention of misadventure at the time of the procedure: Secondary | ICD-10-CM | POA: Diagnosis not present

## 2022-10-02 DIAGNOSIS — T82868A Thrombosis of vascular prosthetic devices, implants and grafts, initial encounter: Secondary | ICD-10-CM

## 2022-10-02 DIAGNOSIS — N189 Chronic kidney disease, unspecified: Secondary | ICD-10-CM | POA: Diagnosis not present

## 2022-10-02 HISTORY — DX: Presence of heart assist device: Z95.811

## 2022-10-02 LAB — POCT I-STAT, CHEM 8
BUN: 19 mg/dL (ref 8–23)
Calcium, Ion: 0.82 mmol/L — CL (ref 1.15–1.40)
Chloride: 89 mmol/L — ABNORMAL LOW (ref 98–111)
Creatinine, Ser: 4.3 mg/dL — ABNORMAL HIGH (ref 0.61–1.24)
Glucose, Bld: 73 mg/dL (ref 70–99)
HCT: 43 % (ref 39.0–52.0)
Hemoglobin: 14.6 g/dL (ref 13.0–17.0)
Potassium: 4.2 mmol/L (ref 3.5–5.1)
Sodium: 130 mmol/L — ABNORMAL LOW (ref 135–145)
TCO2: 29 mmol/L (ref 22–32)

## 2022-10-02 LAB — GLUCOSE, CAPILLARY
Glucose-Capillary: 72 mg/dL (ref 70–99)
Glucose-Capillary: 65 mg/dL — ABNORMAL LOW (ref 70–99)
Glucose-Capillary: 74 mg/dL (ref 70–99)
Glucose-Capillary: 62 mg/dL — ABNORMAL LOW (ref 70–99)
Glucose-Capillary: 88 mg/dL (ref 70–99)

## 2022-10-02 SURGERY — CANCELLED PROCEDURE
Anesthesia: Monitor Anesthesia Care

## 2022-10-02 MED ORDER — IPRATROPIUM-ALBUTEROL 0.5-2.5 (3) MG/3ML IN SOLN
3.0000 mL | Freq: Once | RESPIRATORY_TRACT | Status: AC
  Administered 2022-10-02: 3 mL via RESPIRATORY_TRACT

## 2022-10-02 MED ORDER — ACETAMINOPHEN 10 MG/ML IV SOLN: 1000.0000 mg | Freq: Once | INTRAVENOUS | Status: DC | PRN

## 2022-10-02 MED ORDER — LIDOCAINE-EPINEPHRINE (PF) 1.5 %-1:200000 IJ SOLN
INTRAMUSCULAR | Status: DC | PRN
  Administered 2022-10-02: 30 mL via PERINEURAL

## 2022-10-02 MED ORDER — HEPARIN 6000 UNIT IRRIGATION SOLUTION
Status: AC
  Filled 2022-10-02: qty 500

## 2022-10-02 MED ORDER — FENTANYL CITRATE (PF) 100 MCG/2ML IJ SOLN
INTRAMUSCULAR | Status: AC
  Filled 2022-10-02: qty 2

## 2022-10-02 MED ORDER — DEXTROSE 50 % IV SOLN
25.0000 mL | Freq: Once | INTRAVENOUS | Status: AC
  Administered 2022-10-02: 25 mL via INTRAVENOUS

## 2022-10-02 MED ORDER — DEXTROSE 50 % IV SOLN
INTRAVENOUS | Status: AC
  Filled 2022-10-02: qty 50

## 2022-10-02 MED ORDER — DEXMEDETOMIDINE HCL IN NACL 80 MCG/20ML IV SOLN
INTRAVENOUS | Status: DC | PRN
  Administered 2022-10-02: 12 ug via INTRAVENOUS

## 2022-10-02 MED ORDER — FENTANYL CITRATE (PF) 100 MCG/2ML IJ SOLN: 25.0000 ug | INTRAMUSCULAR | Status: DC | PRN

## 2022-10-02 MED ORDER — IPRATROPIUM-ALBUTEROL 0.5-2.5 (3) MG/3ML IN SOLN
RESPIRATORY_TRACT | Status: AC
  Administered 2022-10-02: 3 mL
  Filled 2022-10-02: qty 3

## 2022-10-02 MED ORDER — CALCIUM CHLORIDE 10 % IV SOLN
0.5000 g | Freq: Once | INTRAVENOUS | Status: AC
  Administered 2022-10-02: 0.5 g via INTRAVENOUS

## 2022-10-02 MED ORDER — CHLORHEXIDINE GLUCONATE 4 % EX LIQD: 60.0000 mL | Freq: Once | CUTANEOUS | Status: DC

## 2022-10-02 MED ORDER — FENTANYL CITRATE (PF) 100 MCG/2ML IJ SOLN
50.0000 ug | Freq: Once | INTRAMUSCULAR | Status: AC
  Administered 2022-10-02: 50 ug via INTRAVENOUS

## 2022-10-02 MED ORDER — PROPOFOL 500 MG/50ML IV EMUL
INTRAVENOUS | Status: DC | PRN
  Administered 2022-10-02: 50 ug/kg/min via INTRAVENOUS

## 2022-10-02 MED ORDER — HEPARIN SOD (PORK) LOCK FLUSH 100 UNIT/ML IV SOLN
INTRAVENOUS | Status: AC
  Filled 2022-10-02: qty 5

## 2022-10-02 MED ORDER — ALBUTEROL SULFATE (2.5 MG/3ML) 0.083% IN NEBU
INHALATION_SOLUTION | RESPIRATORY_TRACT | Status: AC
  Filled 2022-10-02: qty 3

## 2022-10-02 MED ORDER — SODIUM CHLORIDE 0.9 % IV SOLN: INTRAVENOUS | Status: DC

## 2022-10-02 MED ORDER — HEPARIN SOD (PORK) LOCK FLUSH 10 UNIT/ML IV SOLN: 20.0000 [IU] | INTRAVENOUS | Status: DC | PRN

## 2022-10-02 MED ORDER — CHLORHEXIDINE GLUCONATE 0.12 % MT SOLN
15.0000 mL | Freq: Once | OROMUCOSAL | Status: AC
  Administered 2022-10-02: 15 mL via OROMUCOSAL
  Filled 2022-10-02: qty 15

## 2022-10-02 MED ORDER — CEFAZOLIN SODIUM-DEXTROSE 2-4 GM/100ML-% IV SOLN
2.0000 g | INTRAVENOUS | Status: DC
  Filled 2022-10-02: qty 100

## 2022-10-02 MED ORDER — ALBUTEROL SULFATE HFA 108 (90 BASE) MCG/ACT IN AERS
INHALATION_SPRAY | RESPIRATORY_TRACT | Status: DC | PRN
  Administered 2022-10-02: 10 via RESPIRATORY_TRACT

## 2022-10-02 MED ORDER — ORAL CARE MOUTH RINSE: 15.0000 mL | Freq: Once | OROMUCOSAL | Status: AC

## 2022-10-02 SURGICAL SUPPLY — 27 items
ARMBAND PINK RESTRICT EXTREMIT (MISCELLANEOUS) ×1 IMPLANT
BAG COUNTER SPONGE SURGICOUNT (BAG) ×1 IMPLANT
CANISTER SUCT 3000ML PPV (MISCELLANEOUS) ×1 IMPLANT
CATH EMB 4FR 80CM (CATHETERS) ×1 IMPLANT
CLIP VESOCCLUDE MED 6/CT (CLIP) ×1 IMPLANT
CLIP VESOCCLUDE SM WIDE 6/CT (CLIP) ×1 IMPLANT
DERMABOND ADVANCED .7 DNX12 (GAUZE/BANDAGES/DRESSINGS) ×1 IMPLANT
ELECT REM PT RETURN 9FT ADLT (ELECTROSURGICAL)
ELECTRODE REM PT RTRN 9FT ADLT (ELECTROSURGICAL) ×1 IMPLANT
GLOVE SURG SS PI 7.5 STRL IVOR (GLOVE) ×3 IMPLANT
GOWN STRL REUS W/ TWL LRG LVL3 (GOWN DISPOSABLE) ×2 IMPLANT
GOWN STRL REUS W/ TWL XL LVL3 (GOWN DISPOSABLE) ×1 IMPLANT
GOWN STRL REUS W/TWL LRG LVL3 (GOWN DISPOSABLE)
GOWN STRL REUS W/TWL XL LVL3 (GOWN DISPOSABLE)
HEMOSTAT SNOW SURGICEL 2X4 (HEMOSTASIS) IMPLANT
KIT BASIN OR (CUSTOM PROCEDURE TRAY) ×1 IMPLANT
KIT TURNOVER KIT B (KITS) ×1 IMPLANT
NS IRRIG 1000ML POUR BTL (IV SOLUTION) ×1 IMPLANT
PACK CV ACCESS (CUSTOM PROCEDURE TRAY) ×1 IMPLANT
PAD ARMBOARD 7.5X6 YLW CONV (MISCELLANEOUS) ×2 IMPLANT
SUT PROLENE 6 0 BV (SUTURE) ×1 IMPLANT
SUT VIC AB 3-0 SH 27 (SUTURE)
SUT VIC AB 3-0 SH 27X BRD (SUTURE) ×1 IMPLANT
SUT VICRYL 4-0 PS2 18IN ABS (SUTURE) IMPLANT
TOWEL GREEN STERILE (TOWEL DISPOSABLE) ×1 IMPLANT
UNDERPAD 30X36 HEAVY ABSORB (UNDERPADS AND DIAPERS) ×1 IMPLANT
WATER STERILE IRR 1000ML POUR (IV SOLUTION) ×1 IMPLANT

## 2022-10-02 NOTE — Progress Notes (Signed)
Orthopedic Tech Progress Note Patient Details:  [name] [birth year] [id number]  PACU RN called requesting an ARM SLING   Ortho Devices Type of Ortho Device: Arm sling Ortho Device/Splint Location: LLE Ortho Device/Splint Interventions: Ordered, Application, Adjustment   Post Interventions Patient Tolerated: Well Instructions Provided: Care of device  Janit Pagan 10/02/2022, 5:59 PM

## 2022-10-02 NOTE — Transfer of Care (Signed)
Immediate Anesthesia Transfer of Care Note  Patient: [name].  Procedure(s) Performed: LEFT UPPER EXTREMITY ARTERIOVENOUS GORE-TEX GRAFT THROMBECTOMY (Left) CANCELLED PROCEDURE  Patient Location: PACU  Anesthesia Type:MAC  Level of Consciousness: drowsy  Airway & Oxygen Therapy: Patient Spontanous Breathing and Patient connected to face mask oxygen  Post-op Assessment: Report given to RN and Post -op Vital signs reviewed and unstable, Anesthesiologist notified  Post vital signs: Reviewed  Last Vitals:  Vitals Value Taken Time  BP 102/75 10/02/22 1540  Temp    Pulse 87 10/02/22 1543  Resp 20 10/02/22 1543  SpO2 92 % 10/02/22 1543  Vitals shown include unvalidated device data.  Last Pain:  Vitals:   10/02/22 1256  TempSrc:   PainSc: 0-No pain         Complications: No notable events documented.

## 2022-10-02 NOTE — Anesthesia Postprocedure Evaluation (Signed)
Anesthesia Post Note  Patient: [name].  Procedure(s) Performed: CANCELLED PROCEDURE (Left)     Patient location during evaluation: PACU Anesthesia Type: MAC Level of consciousness: awake and alert Pain management: pain level controlled Vital Signs Assessment: post-procedure vital signs reviewed and stable Respiratory status: spontaneous breathing, nonlabored ventilation, respiratory function stable and patient connected to nasal cannula oxygen Cardiovascular status: stable and blood pressure returned to baseline Postop Assessment: no apparent nausea or vomiting Anesthetic complications: no Comments: - case canceled 2/2 worsening respiratory status in OR. Received duoneb X 2 in PACU with symptomatic improvement. Expiratory bilateral wheezing still present but improved without rhonci or hemodynamic instability.    No notable events documented.  Last Vitals:  Vitals:   10/02/22 1555 10/02/22 1620  BP: 108/86 102/76  Pulse: 86   Resp: (!) 24   Temp:  36.7 C  SpO2: 91%     Last Pain:  Vitals:   10/02/22 1555  TempSrc:   PainSc: 0-No pain                 Belenda Cruise P Maliyah Willets

## 2022-10-02 NOTE — H&P (Signed)
History and Physical Interval Note:  10/02/2022 2:26 PM  [name].  has presented today for surgery, with the diagnosis of Thrombosis of dialysis vascular access.  The various methods of treatment have been discussed with the patient and family. After consideration of risks, benefits and other options for treatment, the patient has consented to  Procedure(s): LEFT UPPER EXTREMITY ARTERIOVENOUS GORE-TEX GRAFT THROMBECTOMY (Left) as a surgical intervention.  The patient's history has been reviewed, patient examined, no change in status, stable for surgery.  I have reviewed the patient's chart and labs.  Questions were answered to the patient's satisfaction.     [name]  POST OPERATIVE OFFICE NOTE       CC:  F/u for surgery   HPI:  This is a 82 y.o. male who is s/p left UE AV graft 09/18/22 by DR. Brabham.   He has a previous loop graft left forearm by Dr. Donnetta Hutching 3 years ago.  He does have a TDC for HD currently. He reports no thrill in the graft.  He denies pain, loss of motor and loss of sensation in the left UE.               Surgical Findings: Vein anastomosis was end to side.  The patient had a palpable left radial pulse, however the inflow was not as good as I expected.  If he has trouble with this graft, formal arterial evaluation would be recommended.     Of note he is a Cocaine uses.       No Known Allergies         Current Outpatient Medications  Medication Sig Dispense Refill   albuterol (VENTOLIN HFA) 108 (90 Base) MCG/ACT inhaler Inhale 2 puffs into the lungs every 4 (four) hours as needed for shortness of breath. 18 g 5   atorvastatin (LIPITOR) 40 MG tablet Take 40 mg by mouth at bedtime.       cinacalcet (SENSIPAR) 30 MG tablet Take 30 mg by mouth daily.       docusate calcium (SURFAK) 240 MG capsule Take 240 mg by mouth daily.       Ensure (ENSURE) Take 237 mLs by mouth daily. Dialysis formula needed       losartan (COZAAR) 50 MG tablet Take 25 mg by  mouth daily.       metoprolol succinate (TOPROL-XL) 25 MG 24 hr tablet Take 25 mg by mouth daily.       oxyCODONE-acetaminophen (PERCOCET) 5-325 MG tablet Take 1 tablet by mouth every 6 (six) hours as needed for severe pain. 20 tablet 0   Tiotropium Bromide-Olodaterol (STIOLTO RESPIMAT) 2.5-2.5 MCG/ACT AERS Inhale 2 puffs into the lungs daily. (Patient taking differently: Inhale 2 puffs into the lungs 2 (two) times daily.) 1 each 11    No current facility-administered medications for this visit.       ROS:  See HPI   Physical Exam:   Findings:       +--------------------+----------+-----------------+--------+  AVG                PSV (cm/s)Flow Vol (mL/min)Describe  +--------------------+----------+-----------------+--------+  Native artery inflow    33                               +--------------------+----------+-----------------+--------+  Arterial anastomosis  occluded  +--------------------+----------+-----------------+--------+  Prox graft                                     occluded  +--------------------+----------+-----------------+--------+  Mid graft                                      occluded  +--------------------+----------+-----------------+--------+  Distal graft                                   occluded  +--------------------+----------+-----------------+--------+  Venous anastomosis                             occluded  +--------------------+----------+-----------------+--------+  Venous outflow                                 occluded  +--------------------+----------+-----------------+--------+  Complex area posterior to AVG in proximal and mid segment, suggestive of  possible hematoma.     Summary:  Occluded left upper extremity arteriovenous graft.    *See table(s) above for measurements and observations.    Diagnosing physician: Bernard Barban MD    ---------------------------------------------------------------------------  -----     Preliminary      Incision:  well healed Extremities:  palpable radial pulse, no palpable thrill in the UE AV graft Neuro: sensation intact Lungs:  productive cough.       Assessment/Plan:  This is a 82 y.o. male who is s/p:left UE AV graft that is occluded.  An arterial duplex was performed today revealing triphasic wave forms.  There was no subclavian lesion note on duplex.  Good arterial inflow on arterial duplex.  We will schedule him for left UE AV graft thrombectomy on Weds. 10/02/21 by Dr. Trula Slade.  He agrees with this plan HD is on TTS.                Due to computer issues in the vascular lab today the arterial duplex has not been received, but verbally corresponded.      Bernard Horseman PA-C [facility] [phone]     Clinic MD:  Trula Slade

## 2022-10-02 NOTE — Anesthesia Procedure Notes (Signed)
Anesthesia Regional Block: Supraclavicular block   Pre-Anesthetic Checklist: , timeout performed,  Correct Patient, Correct Site, Correct Laterality,  Correct Procedure, Correct Position, site marked,  Risks and benefits discussed,  Surgical consent,  Pre-op evaluation,  At surgeon's request and post-op pain management  Laterality: Left  Prep: Dura Prep       Needles:  Injection technique: Single-shot  Needle Type: Echogenic Stimulator Needle     Needle Length: 5cm  Needle Gauge: 20     Additional Needles:   Procedures:,,,, ultrasound used (permanent image in chart),,    Narrative:  Start time: 10/02/2022 2:16 PM End time: 10/02/2022 2:19 PM Injection made incrementally with aspirations every 5 mL.  Performed by: Personally  Anesthesiologist: Darral Dash, DO  Additional Notes: Patient identified. Risks/Benefits/Options discussed with patient including but not limited to bleeding, infection, nerve damage, failed block, incomplete pain control. Patient expressed understanding and wished to proceed. All questions were answered. Sterile technique was used throughout the entire procedure. Please see nursing notes for vital signs. Aspirated in 5cc intervals with injection for negative confirmation. Patient was given instructions on fall risk and not to get out of bed. All questions and concerns addressed with instructions to call with any issues or inadequate analgesia.

## 2022-10-02 NOTE — Progress Notes (Signed)
[name] case was canceled given he could not tolerate laying flat in the operating room even after regional block.  He has been transported to PACU and will get breathing treatment and will assess his clinical status.  Assuming he is back to baseline we will likely plan follow-up in the office to discuss contralateral access in the right arm.

## 2022-10-02 NOTE — Anesthesia Preprocedure Evaluation (Addendum)
Anesthesia Evaluation  Patient identified by MRN, date of birth, ID band Patient awake    Reviewed: Allergy & Precautions, NPO status , Patient's Chart, lab work & pertinent test results  Airway Mallampati: II  TM Distance: >3 FB Neck ROM: Full    Dental  (+) Poor Dentition   Pulmonary COPD,  COPD inhaler, former smoker    + decreased breath sounds  rales    Cardiovascular hypertension, Pt. on medications and Pt. on home beta blockers + CAD and +CHF  + dysrhythmias  Rhythm:Regular Rate:Normal  ECHO 2020:  1. The left ventricle has moderate-severely reduced systolic function,  with an ejection fraction of 30-35%. The cavity size was mildly dilated.  There is moderately increased left ventricular wall thickness. Left  ventricular diastolic Doppler parameters  are indeterminate. There is incoordinate septal motion. Left ventricular  diffuse hypokinesis.   2. Mildly thickened tricuspid valve leaflets.   3. The mitral valve is abnormal. Moderate thickening of the mitral valve  leaflet.   4. Moderately sized vegetation on the tricuspid valve.   5. The tricuspid valve is abnormal.   6. The aortic valve is tricuspid. No stenosis of the aortic valve.   7. The aorta is normal in size and structure.   8. The inferior vena cava was normal in size with <50% respiratory  variability.     Neuro/Psych negative neurological ROS  negative psych ROS   GI/Hepatic ,GERD  ,,(+) Hepatitis -  Endo/Other  diabetes    Renal/GU   negative genitourinary   Musculoskeletal negative musculoskeletal ROS (+)    Abdominal Normal abdominal exam  (+)   Peds  Hematology   Anesthesia Other Findings   Reproductive/Obstetrics                             Anesthesia Physical Anesthesia Plan  ASA: 3  Anesthesia Plan: MAC   Post-op Pain Management: Regional block*   Induction: Intravenous  PONV Risk Score and Plan: 1  and Ondansetron, Dexamethasone, Propofol infusion and Treatment may vary due to age or medical condition  Airway Management Planned: Simple Face Mask, Natural Airway and Nasal Cannula  Additional Equipment: None  Intra-op Plan:   Post-operative Plan:   Informed Consent: I have reviewed the patients History and Physical, chart, labs and discussed the procedure including the risks, benefits and alternatives for the proposed anesthesia with the patient or authorized representative who has indicated his/her understanding and acceptance.     Dental advisory given  Plan Discussed with: CRNA  Anesthesia Plan Comments:        Anesthesia Quick Evaluation

## 2022-10-08 ENCOUNTER — Other Ambulatory Visit: Payer: Self-pay | Admitting: *Deleted

## 2022-10-08 ENCOUNTER — Emergency Department (HOSPITAL_COMMUNITY): Payer: No Typology Code available for payment source

## 2022-10-08 ENCOUNTER — Inpatient Hospital Stay (HOSPITAL_COMMUNITY)
Admission: EM | Admit: 2022-10-08 | Discharge: 2022-10-24 | DRG: 291 | Disposition: E | Payer: No Typology Code available for payment source | Attending: Internal Medicine | Admitting: Internal Medicine

## 2022-10-08 DIAGNOSIS — Z8673 Personal history of transient ischemic attack (TIA), and cerebral infarction without residual deficits: Secondary | ICD-10-CM

## 2022-10-08 DIAGNOSIS — Z515 Encounter for palliative care: Secondary | ICD-10-CM

## 2022-10-08 DIAGNOSIS — R0603 Acute respiratory distress: Secondary | ICD-10-CM | POA: Diagnosis present

## 2022-10-08 DIAGNOSIS — J440 Chronic obstructive pulmonary disease with acute lower respiratory infection: Secondary | ICD-10-CM | POA: Diagnosis present

## 2022-10-08 DIAGNOSIS — J441 Chronic obstructive pulmonary disease with (acute) exacerbation: Secondary | ICD-10-CM | POA: Diagnosis present

## 2022-10-08 DIAGNOSIS — M109 Gout, unspecified: Secondary | ICD-10-CM | POA: Diagnosis present

## 2022-10-08 DIAGNOSIS — N186 End stage renal disease: Secondary | ICD-10-CM

## 2022-10-08 DIAGNOSIS — J181 Lobar pneumonia, unspecified organism: Secondary | ICD-10-CM | POA: Diagnosis present

## 2022-10-08 DIAGNOSIS — Z992 Dependence on renal dialysis: Secondary | ICD-10-CM

## 2022-10-08 DIAGNOSIS — J189 Pneumonia, unspecified organism: Secondary | ICD-10-CM | POA: Diagnosis not present

## 2022-10-08 DIAGNOSIS — I5023 Acute on chronic systolic (congestive) heart failure: Secondary | ICD-10-CM | POA: Diagnosis present

## 2022-10-08 DIAGNOSIS — J918 Pleural effusion in other conditions classified elsewhere: Secondary | ICD-10-CM | POA: Diagnosis present

## 2022-10-08 DIAGNOSIS — D696 Thrombocytopenia, unspecified: Secondary | ICD-10-CM | POA: Diagnosis not present

## 2022-10-08 DIAGNOSIS — E1122 Type 2 diabetes mellitus with diabetic chronic kidney disease: Secondary | ICD-10-CM | POA: Diagnosis present

## 2022-10-08 DIAGNOSIS — Z751 Person awaiting admission to adequate facility elsewhere: Secondary | ICD-10-CM

## 2022-10-08 DIAGNOSIS — Z1152 Encounter for screening for COVID-19: Secondary | ICD-10-CM

## 2022-10-08 DIAGNOSIS — Z66 Do not resuscitate: Secondary | ICD-10-CM | POA: Diagnosis not present

## 2022-10-08 DIAGNOSIS — J9601 Acute respiratory failure with hypoxia: Secondary | ICD-10-CM | POA: Diagnosis present

## 2022-10-08 DIAGNOSIS — I2510 Atherosclerotic heart disease of native coronary artery without angina pectoris: Secondary | ICD-10-CM | POA: Diagnosis present

## 2022-10-08 DIAGNOSIS — I428 Other cardiomyopathies: Secondary | ICD-10-CM | POA: Diagnosis present

## 2022-10-08 DIAGNOSIS — Z91119 Patient's noncompliance with dietary regimen due to unspecified reason: Secondary | ICD-10-CM

## 2022-10-08 DIAGNOSIS — Y95 Nosocomial condition: Secondary | ICD-10-CM | POA: Diagnosis present

## 2022-10-08 DIAGNOSIS — M898X9 Other specified disorders of bone, unspecified site: Secondary | ICD-10-CM | POA: Diagnosis present

## 2022-10-08 DIAGNOSIS — Z8546 Personal history of malignant neoplasm of prostate: Secondary | ICD-10-CM

## 2022-10-08 DIAGNOSIS — I5084 End stage heart failure: Secondary | ICD-10-CM | POA: Diagnosis present

## 2022-10-08 DIAGNOSIS — Z95811 Presence of heart assist device: Secondary | ICD-10-CM | POA: Diagnosis not present

## 2022-10-08 DIAGNOSIS — J9811 Atelectasis: Secondary | ICD-10-CM | POA: Diagnosis present

## 2022-10-08 DIAGNOSIS — Z8042 Family history of malignant neoplasm of prostate: Secondary | ICD-10-CM

## 2022-10-08 DIAGNOSIS — E785 Hyperlipidemia, unspecified: Secondary | ICD-10-CM | POA: Diagnosis present

## 2022-10-08 DIAGNOSIS — E871 Hypo-osmolality and hyponatremia: Secondary | ICD-10-CM | POA: Diagnosis present

## 2022-10-08 DIAGNOSIS — F17210 Nicotine dependence, cigarettes, uncomplicated: Secondary | ICD-10-CM | POA: Diagnosis present

## 2022-10-08 DIAGNOSIS — Z808 Family history of malignant neoplasm of other organs or systems: Secondary | ICD-10-CM

## 2022-10-08 DIAGNOSIS — I4892 Unspecified atrial flutter: Secondary | ICD-10-CM | POA: Diagnosis present

## 2022-10-08 DIAGNOSIS — N2581 Secondary hyperparathyroidism of renal origin: Secondary | ICD-10-CM | POA: Diagnosis present

## 2022-10-08 DIAGNOSIS — Z79899 Other long term (current) drug therapy: Secondary | ICD-10-CM

## 2022-10-08 DIAGNOSIS — I132 Hypertensive heart and chronic kidney disease with heart failure and with stage 5 chronic kidney disease, or end stage renal disease: Secondary | ICD-10-CM | POA: Diagnosis present

## 2022-10-08 DIAGNOSIS — J90 Pleural effusion, not elsewhere classified: Principal | ICD-10-CM

## 2022-10-08 DIAGNOSIS — I5082 Biventricular heart failure: Secondary | ICD-10-CM | POA: Diagnosis present

## 2022-10-08 DIAGNOSIS — K219 Gastro-esophageal reflux disease without esophagitis: Secondary | ICD-10-CM | POA: Diagnosis present

## 2022-10-08 DIAGNOSIS — F1410 Cocaine abuse, uncomplicated: Secondary | ICD-10-CM | POA: Diagnosis not present

## 2022-10-08 DIAGNOSIS — Z91199 Patient's noncompliance with other medical treatment and regimen due to unspecified reason: Secondary | ICD-10-CM

## 2022-10-08 DIAGNOSIS — R234 Changes in skin texture: Secondary | ICD-10-CM | POA: Diagnosis present

## 2022-10-08 DIAGNOSIS — Z807 Family history of other malignant neoplasms of lymphoid, hematopoietic and related tissues: Secondary | ICD-10-CM

## 2022-10-08 DIAGNOSIS — Z91148 Patient's other noncompliance with medication regimen for other reason: Secondary | ICD-10-CM

## 2022-10-08 DIAGNOSIS — I5043 Acute on chronic combined systolic (congestive) and diastolic (congestive) heart failure: Secondary | ICD-10-CM | POA: Diagnosis not present

## 2022-10-08 DIAGNOSIS — R54 Age-related physical debility: Secondary | ICD-10-CM | POA: Diagnosis present

## 2022-10-08 DIAGNOSIS — D631 Anemia in chronic kidney disease: Secondary | ICD-10-CM | POA: Diagnosis present

## 2022-10-08 DIAGNOSIS — Z7951 Long term (current) use of inhaled steroids: Secondary | ICD-10-CM

## 2022-10-08 DIAGNOSIS — Z7189 Other specified counseling: Secondary | ICD-10-CM | POA: Diagnosis not present

## 2022-10-08 DIAGNOSIS — Z955 Presence of coronary angioplasty implant and graft: Secondary | ICD-10-CM

## 2022-10-08 DIAGNOSIS — I484 Atypical atrial flutter: Secondary | ICD-10-CM | POA: Diagnosis not present

## 2022-10-08 DIAGNOSIS — Z86718 Personal history of other venous thrombosis and embolism: Secondary | ICD-10-CM

## 2022-10-08 LAB — CBC WITH DIFFERENTIAL/PLATELET
Abs Immature Granulocytes: 0.02 10*3/uL (ref 0.00–0.07)
Basophils Absolute: 0 10*3/uL (ref 0.0–0.1)
Basophils Relative: 1 %
Eosinophils Absolute: 0.1 10*3/uL (ref 0.0–0.5)
Eosinophils Relative: 2 %
HCT: 38.4 % — ABNORMAL LOW (ref 39.0–52.0)
Hemoglobin: 13.1 g/dL (ref 13.0–17.0)
Immature Granulocytes: 0 %
Lymphocytes Relative: 10 %
Lymphs Abs: 0.6 10*3/uL — ABNORMAL LOW (ref 0.7–4.0)
MCH: 33.2 pg (ref 26.0–34.0)
MCHC: 34.1 g/dL (ref 30.0–36.0)
MCV: 97.5 fL (ref 80.0–100.0)
Monocytes Absolute: 0.9 10*3/uL (ref 0.1–1.0)
Monocytes Relative: 15 %
Neutro Abs: 4 10*3/uL (ref 1.7–7.7)
Neutrophils Relative %: 72 %
Platelets: 201 10*3/uL (ref 150–400)
RBC: 3.94 MIL/uL — ABNORMAL LOW (ref 4.22–5.81)
RDW: 15.1 % (ref 11.5–15.5)
WBC: 5.5 10*3/uL (ref 4.0–10.5)
nRBC: 0 % (ref 0.0–0.2)

## 2022-10-08 LAB — TROPONIN I (HIGH SENSITIVITY): Troponin I (High Sensitivity): 45 ng/L — ABNORMAL HIGH (ref <18)

## 2022-10-08 LAB — CBC
HCT: 37.7 % — ABNORMAL LOW (ref 39.0–52.0)
Hemoglobin: 12.7 g/dL — ABNORMAL LOW (ref 13.0–17.0)
MCH: 32.8 pg (ref 26.0–34.0)
MCHC: 33.7 g/dL (ref 30.0–36.0)
MCV: 97.4 fL (ref 80.0–100.0)
Platelets: 129 10*3/uL — ABNORMAL LOW (ref 150–400)
RBC: 3.87 MIL/uL — ABNORMAL LOW (ref 4.22–5.81)
RDW: 15 % (ref 11.5–15.5)
WBC: 5.4 10*3/uL (ref 4.0–10.5)
nRBC: 0 % (ref 0.0–0.2)

## 2022-10-08 LAB — RENAL FUNCTION PANEL
Albumin: 3.4 g/dL — ABNORMAL LOW (ref 3.5–5.0)
Anion gap: 14 (ref 5–15)
BUN: 16 mg/dL (ref 8–23)
CO2: 25 mmol/L (ref 22–32)
Calcium: 7.7 mg/dL — ABNORMAL LOW (ref 8.9–10.3)
Chloride: 92 mmol/L — ABNORMAL LOW (ref 98–111)
Creatinine, Ser: 3.47 mg/dL — ABNORMAL HIGH (ref 0.61–1.24)
GFR, Estimated: 17 mL/min — ABNORMAL LOW (ref >60)
Glucose, Bld: 83 mg/dL (ref 70–99)
Phosphorus: 2.5 mg/dL (ref 2.5–4.6)
Potassium: 4.1 mmol/L (ref 3.5–5.1)
Sodium: 131 mmol/L — ABNORMAL LOW (ref 135–145)

## 2022-10-08 LAB — BASIC METABOLIC PANEL
Anion gap: 14 (ref 5–15)
BUN: 15 mg/dL (ref 8–23)
CO2: 28 mmol/L (ref 22–32)
Calcium: 7.9 mg/dL — ABNORMAL LOW (ref 8.9–10.3)
Chloride: 88 mmol/L — ABNORMAL LOW (ref 98–111)
Creatinine, Ser: 3.5 mg/dL — ABNORMAL HIGH (ref 0.61–1.24)
GFR, Estimated: 17 mL/min — ABNORMAL LOW (ref >60)
Glucose, Bld: 82 mg/dL (ref 70–99)
Potassium: 4 mmol/L (ref 3.5–5.1)
Sodium: 130 mmol/L — ABNORMAL LOW (ref 135–145)

## 2022-10-08 LAB — BRAIN NATRIURETIC PEPTIDE: B Natriuretic Peptide: >4500 pg/mL — ABNORMAL HIGH (ref 0.0–100.0)

## 2022-10-08 LAB — I-STAT ARTERIAL BLOOD GAS, ED
Acid-Base Excess: 5 mmol/L — ABNORMAL HIGH (ref 0.0–2.0)
Bicarbonate: 27.1 mmol/L (ref 20.0–28.0)
Calcium, Ion: 0.96 mmol/L — ABNORMAL LOW (ref 1.15–1.40)
HCT: 40 % (ref 39.0–52.0)
Hemoglobin: 13.6 g/dL (ref 13.0–17.0)
O2 Saturation: 100 %
Patient temperature: 96.2
Potassium: 3.6 mmol/L (ref 3.5–5.1)
Sodium: 129 mmol/L — ABNORMAL LOW (ref 135–145)
TCO2: 28 mmol/L (ref 22–32)
pCO2 arterial: 29.7 mmHg — ABNORMAL LOW (ref 32–48)
pH, Arterial: 7.564 — ABNORMAL HIGH (ref 7.35–7.45)
pO2, Arterial: 300 mmHg — ABNORMAL HIGH (ref 83–108)

## 2022-10-08 LAB — I-STAT CHEM 8, ED
BUN: 20 mg/dL (ref 8–23)
Calcium, Ion: 0.8 mmol/L — CL (ref 1.15–1.40)
Chloride: 92 mmol/L — ABNORMAL LOW (ref 98–111)
Creatinine, Ser: 3.4 mg/dL — ABNORMAL HIGH (ref 0.61–1.24)
Glucose, Bld: 88 mg/dL (ref 70–99)
HCT: 43 % (ref 39.0–52.0)
Hemoglobin: 14.6 g/dL (ref 13.0–17.0)
Potassium: 5.5 mmol/L — ABNORMAL HIGH (ref 3.5–5.1)
Sodium: 128 mmol/L — ABNORMAL LOW (ref 135–145)
TCO2: 30 mmol/L (ref 22–32)

## 2022-10-08 LAB — HEPATITIS B SURFACE ANTIGEN: Hepatitis B Surface Ag: NONREACTIVE

## 2022-10-08 LAB — RESP PANEL BY RT-PCR (RSV, FLU A&B, COVID)  RVPGX2
Influenza A by PCR: NEGATIVE
Influenza B by PCR: NEGATIVE
Resp Syncytial Virus by PCR: NEGATIVE
SARS Coronavirus 2 by RT PCR: NEGATIVE

## 2022-10-08 MED ORDER — HEPARIN SODIUM (PORCINE) 1000 UNIT/ML DIALYSIS
20.0000 [IU]/kg | INTRAMUSCULAR | Status: DC | PRN
  Administered 2022-10-09: 1400 [IU] via INTRAVENOUS_CENTRAL
  Filled 2022-10-08 (×2): qty 2

## 2022-10-08 MED ORDER — FUROSEMIDE 10 MG/ML IJ SOLN
80.0000 mg | Freq: Once | INTRAMUSCULAR | Status: AC
  Administered 2022-10-08: 80 mg via INTRAVENOUS
  Filled 2022-10-08: qty 8

## 2022-10-08 MED ORDER — HEPARIN SODIUM (PORCINE) 5000 UNIT/ML IJ SOLN
5000.0000 [IU] | Freq: Three times a day (TID) | INTRAMUSCULAR | Status: DC
  Administered 2022-10-08 – 2022-10-17 (×27): 5000 [IU] via SUBCUTANEOUS
  Filled 2022-10-08 (×27): qty 1

## 2022-10-08 MED ORDER — ALTEPLASE 2 MG IJ SOLR: 2.0000 mg | Freq: Once | INTRAMUSCULAR | Status: DC | PRN

## 2022-10-08 MED ORDER — LIDOCAINE HCL (PF) 1 % IJ SOLN: 5.0000 mL | INTRAMUSCULAR | Status: DC | PRN

## 2022-10-08 MED ORDER — VANCOMYCIN HCL 750 MG/150ML IV SOLN
750.0000 mg | INTRAVENOUS | Status: DC
  Filled 2022-10-08: qty 150

## 2022-10-08 MED ORDER — CHLORHEXIDINE GLUCONATE CLOTH 2 % EX PADS
6.0000 | MEDICATED_PAD | Freq: Every day | CUTANEOUS | Status: DC
  Administered 2022-10-10 – 2022-10-20 (×8): 6 via TOPICAL

## 2022-10-08 MED ORDER — PENTAFLUOROPROP-TETRAFLUOROETH EX AERO: 1.0000 | INHALATION_SPRAY | CUTANEOUS | Status: DC | PRN

## 2022-10-08 MED ORDER — SODIUM CHLORIDE 0.9 % IV SOLN
1.0000 g | Freq: Every day | INTRAVENOUS | Status: DC
  Administered 2022-10-08 – 2022-10-09 (×2): 1 g via INTRAVENOUS
  Filled 2022-10-08: qty 1
  Filled 2022-10-08: qty 10
  Filled 2022-10-08: qty 1

## 2022-10-08 MED ORDER — LIDOCAINE-PRILOCAINE 2.5-2.5 % EX CREA: 1.0000 | TOPICAL_CREAM | CUTANEOUS | Status: DC | PRN

## 2022-10-08 MED ORDER — HEPARIN SODIUM (PORCINE) 1000 UNIT/ML DIALYSIS
1000.0000 [IU] | INTRAMUSCULAR | Status: DC | PRN
  Administered 2022-10-09: 4200 [IU]
  Filled 2022-10-08 (×2): qty 1

## 2022-10-08 MED ORDER — VANCOMYCIN HCL 1500 MG/300ML IV SOLN
1500.0000 mg | INTRAVENOUS | Status: AC
  Administered 2022-10-08: 1500 mg via INTRAVENOUS
  Filled 2022-10-08: qty 300

## 2022-10-08 MED ORDER — ANTICOAGULANT SODIUM CITRATE 4% (200MG/5ML) IV SOLN
5.0000 mL | Status: DC | PRN
  Filled 2022-10-08: qty 5

## 2022-10-08 NOTE — H&P (Addendum)
History and Physical  [name]. XHB:[id number] DOB: 04-18-1941 DOA: 09/28/2022  Referring physician: Dr. Langston Masker, [place]  PCP: [name], MD  Outpatient Specialists: Nephrology, cardiology. Patient coming from: Home  Chief Complaint: Shortness of breath  HPI: [name]. is a 82 y.o. 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  EMS was activated.  Upon EMS assessment the patient was found to have expiratory wheezing and crackles in lower lung bases and was using accessory muscles to breathe.  The patient received breathing treatment and was placed on CPAP with improvement of his symptoms.  He was brought to the ED for further evaluation.  In the ED, he was placed on BiPAP for 2 hours.  He was weaned off the BiPAP and placed on 4 L nasal cannula however he had to be placed back on BiPAP due to increased work of breathing.  Chest x-ray showed pulmonary edema and bilateral pleural effusions right greater than left.  EDP discussed the case with nephrology, Dr. Moshe Cipro.  Possible hemodialysis tomorrow if no improvement on BiPAP.  Due to concern for HCAP, IV antibiotics were initiated in the ED.  ED Course: Tmax 97.3.  BP 103/84, pulse 94, respiratory rate 28, O2 saturation 94% on BiPAP.  Lab studies remarkable for VBG 7.56/29.7/300.  Sodium 129, BNP greater than 4500, hemoglobin 13.6, hematocrit 40.  COVID-19, influenza A, influenza B, RSV, all negative.  Review of Systems: Review of systems as noted in the HPI. All other systems reviewed and are negative.   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[place])    Obesity    Prostate cancer ([place])    Tobacco abuse counseling    Past Surgical History:  Procedure Laterality Date   AV FISTULA PLACEMENT Left 04/26/2019   Procedure: INSERTION OF ARTERIOVENOUS (AV) GORE-TEX GRAFT ARM - using [facility] Graft size 45cm;  Surgeon: Rosetta Posner, MD;  Location: [facility] OR;  Service: Vascular;  Laterality: Left;   AV FISTULA PLACEMENT Left 09/18/2022   Procedure: INSERTION OF LEFT ARM ARTERIOVENOUS (AV) GORE-TEX GRAFT;  Surgeon: Serafina Mitchell, MD;  Location: [place];  Service: Vascular;  Laterality: Left;  with regional block   CARDIAC CATHETERIZATION  02/08/02   CARDIAC CATHETERIZATION  11/20/10   drug eluting stent;mid left LAD, There is mild hypokinesis of the anterolateral wall. LVEF  is estimated at 50-55%   INSERTION OF BRONCHIAL STENT     went in groin to R chest   INSERTION OF DIALYSIS CATHETER Right 04/26/2019   Procedure: INSERTION OF HEMODIALYSIS CATHETER - using Palindrome Catheter size 23cm;  Surgeon: Rosetta Posner, MD;  Location: [place];  Service: Vascular;  Laterality: Right;   LEFT HEART CATH AND CORONARY ANGIOGRAPHY N/A 04/23/2019   Procedure: LEFT HEART CATH AND CORONARY ANGIOGRAPHY;  Surgeon: Sherren Mocha, MD;  Location: [place] [facility];  Service: Cardiovascular;  Laterality: N/A;   PENILE PROSTHESIS IMPLANT     '05   THORACENTESIS Right 04/10/2022   Procedure:  THORACENTESIS;  Surgeon: Lanier Clam, MD;  [facility];  Service: Pulmonary;  Laterality: Right;    Social History:  reports that he has quit smoking. His smoking use included cigarettes. He has a 12.50 pack-year smoking history. He has never been exposed to tobacco smoke. He has never used smokeless tobacco. He reports that he does not currently use alcohol. He reports that he does not currently use drugs after having used the following drugs: Cocaine and Marijuana.   No  Known Allergies  Family History  Problem Relation Age of Onset   Aneurysm Mother    Multiple myeloma Mother    Aneurysm Father    Cancer Brother    Prostate cancer Brother    Cancer Other    Prostate cancer Brother    Bone cancer Brother    Breast cancer Neg Hx    Colon cancer Neg Hx    Pancreatic cancer Neg Hx       Prior to Admission medications   Medication Sig Start Date End Date Taking? Authorizing Provider  albuterol (VENTOLIN HFA) 108 (90 Base) MCG/ACT inhaler Inhale 2 puffs into the lungs every 4 (four) hours as needed for shortness of breath. 02/06/22   Hunsucker, Bonna Gains, MD  atorvastatin (LIPITOR) 40 MG tablet Take 40 mg by mouth at bedtime.    [provider]  cinacalcet (SENSIPAR) 30 MG tablet Take 30 mg by mouth daily. 04/09/22   [provider]  docusate calcium (SURFAK) 240 MG capsule Take 240 mg by mouth daily.    [provider]  Ensure (ENSURE) Take 237 mLs by mouth daily. Dialysis formula needed    [provider]  losartan (COZAAR) 50 MG tablet Take 25 mg by mouth daily.    [provider]  metoprolol succinate (TOPROL-XL) 25 MG 24 hr tablet Take 25 mg by mouth daily. 06/18/22   [provider]  oxyCODONE-acetaminophen (PERCOCET) 5-325 MG tablet Take 1 tablet by mouth every 6 (six) hours as needed for severe pain. 09/18/22   Rhyne, Hulen Shouts, PA-C  Tiotropium Bromide-Olodaterol (STIOLTO RESPIMAT) 2.5-2.5 MCG/ACT AERS Inhale 2 puffs into the lungs daily. Patient taking differently: Inhale 2 puffs into the lungs 2 (two) times daily. 04/04/22   Hunsucker, Bonna Gains, MD    Physical Exam: BP 102/88   Pulse 89   Temp (!) 96.2 F (35.7 C) (Axillary)   Resp (!) 22   Ht '5\' 8"'$  (1.727 m)   Wt 72 kg   BMI 24.14 kg/m   General: 82 y.o. year-old male well developed well nourished in no acute distress.  Alert and oriented x3. Cardiovascular: Regular rate and rhythm with no rubs or gallops.  No thyromegaly or JVD  noted.  Trace lower extremity edema. 2/4 pulses in all 4 extremities. Respiratory: Mild rales at bases.  No wheezing noted.  Poor inspiratory effort. Abdomen: Soft nontender nondistended with normal bowel sounds x4 quadrants. Muskuloskeletal: No cyanosis or clubbing.  Trace edema noted in lower extremities bilaterally Neuro: CN II-XII intact, strength, sensation, reflexes Skin: Excessive feet desquamation noted. Psychiatry: Judgement and insight appear normal. Mood is appropriate for condition and setting          Labs on Admission:  Basic Metabolic Panel: Recent Labs  Lab 10/02/22 1350 10/06/2022 1744 10/05/2022 1804  NA 130* 128* 129*  K 4.2 5.5* 3.6  CL 89* 92*  --   GLUCOSE 73 88  --   BUN 19 20  --   CREATININE 4.30*  3.40*  --    Liver Function Tests: No results for input(s): "AST", "ALT", "ALKPHOS", "BILITOT", "PROT", "ALBUMIN" in the last 168 hours. No results for input(s): "LIPASE", "AMYLASE" in the last 168 hours. No results for input(s): "AMMONIA" in the last 168 hours. CBC: Recent Labs  Lab 10/02/22 1350 10/13/2022 1744 10/06/2022 1747 10/01/2022 1804  WBC  --   --  5.5  --   NEUTROABS  --   --  4.0  --   HGB 14.6 14.6 13.1 13.6  HCT 43.0 43.0 38.4* 40.0  MCV  --   --  97.5  --   PLT  --   --  201  --    Cardiac Enzymes: No results for input(s): "CKTOTAL", "CKMB", "CKMBINDEX", "TROPONINI" in the last 168 hours.  BNP (last 3 results) Recent Labs    11/26/21 0911 07/31/22 0934 10/12/2022 1748  BNP >4,500.0* >4,500.0* >4,500.0*    ProBNP (last 3 results) No results for input(s): "PROBNP" in the last 8760 hours.  CBG: Recent Labs  Lab 10/02/22 1235 10/02/22 1406 10/02/22 1439 10/02/22 1600 10/02/22 1632  GLUCAP 72 65* 74 62* 88    Radiological Exams on Admission: DG Chest Port 1 View  Result Date: 10/04/2022 CLINICAL DATA:  Triage notes:"Pt arrived via [facility] for shortness of breath from home. Pt was on couch in apparent distress, accessory muscle use,  extended exhalation. Pt reports shortness of breath post dialysis. EMS report expiratory wheezing EXAM: PORTABLE CHEST - 1 VIEW COMPARISON:  09/11/2022 FINDINGS: Stable tunneled right IJ hemodialysis catheter to the cavoatrial junction. Persistent moderate right pleural effusion with atelectasis/consolidation at the right lung base. Left lung clear. Stable cardiomegaly. No pneumothorax. Orthopedic anchor in the right humeral head. IMPRESSION: Persistent moderate right pleural effusion with right basilar atelectasis/consolidation. Electronically Signed   By: Lucrezia Europe M.D.   On: 10/07/2022 17:45    EKG: I independently viewed the EKG done and my findings are as followed: Sinus rhythm rate of 93.  Nonspecific ST changes.  QTc 555.  Assessment/Plan Present on Admission:  Acute respiratory distress  Principal Problem:   Acute respiratory distress  Acute respiratory distress secondary to pulmonary edema, bilateral pleural effusions, right greater than left, POA Acute hypoxic respiratory failure secondary to above Personally reviewed chest x-ray done on admission which shows the above May benefit from thoracentesis by IR and may benefit from additional hemodialysis sessions. IR consulted for right thoracentesis. Maintain O2 saturation greater than 90% Not on oxygen supplementation at baseline Wean off BiPAP as tolerated. Incentive spirometer, flutter valve when no longer on BiPAP. Strict n.p.o. while on BiPAP.  Bibasilar atelectasis/consolidation Cannot rule out pneumonia Continue empiric IV antibiotics started in the ED cefepime and IV vancomycin. Monitor fever curve and WBC  ESRD on HD TTS Completed hemodialysis on 10/08/2021. Nephrology, Dr. Moshe Cipro, consulted by EDP, will see in consultation. Volume status and electrolytes managed by hemodialysis.  HFrEF 15 to 20% From 2D echo done in [facility] Strict I's and O's and daily weight  Excessive feet desquamation, POA Wound care  specialist consulted to assist Local wound care under the guidance of wound care specialist  Hypertension, BPs are currently soft Hold off home oral antihypertensives Monitor vital signs Maintain MAP greater than 65.  Hyperlipidemia Resume home Lipitor 40 mg daily.  Prolonged QTc Admission twelve-lead EKG showing QTc 555 Avoid QTc prolonging agents Optimize magnesium and potassium levels. Closely monitor on telemetry.    Critical care time: 65 minutes.     DVT  prophylaxis: Subcu Lovenox daily  Code Status: Full code  Family Communication: None at bedside  Disposition Plan: Admitted to  Consults called: Nephrology consulted by EDP.  Admission status: Inpatient status.   Status is: Inpatient The patient requires at least 2 midnights for further evaluation and treatment of present condition.   Kayleen Memos MD [facility] Pager [phone]  If 7PM-7AM, please contact night-coverage [email or web address] Password [email or web address]  09/25/2022, 9:32 PM

## 2022-10-08 NOTE — ED Provider Notes (Signed)
[facility] EMERGENCY DEPARTMENT Provider Note   CSN: [id number] Arrival date & time: 09/25/2022  1706     History  Chief Complaint  Patient presents with   Shortness of Breath    [name]. is a 82 y.o. male with history of end-stage renal disease on dialysis, COPD, CHF, presented ED with respiratory distress.  Patient completed dialysis per normal today, went home and is having worsening respiratory distress.  EMS put the patient on CPAP en route to the hospital.  Noting crackles in the lung bases and diminished breath sounds in the right lower lobe.  Patient ports he does have a history of smoking and COPD but does not wear oxygen at home.  Echo 05/31/22 on [facility] record system  Mildly dilated chamber with normal wall thickness. Severe diffuse  hypokinesis with LVEF 15-20%.   Abnormal diastolic function.     Right ventricle   Severely dilated with reduced systolic function.        Left atrium   Severely dilated.  Saline contrast injection suggests the presence of a  patent foramen ovale with right to left shunting.     Right atrium   Severely dilated. The interatrial septum bows to the left suggesting  elevated right atrial pressure. Estimated RA pressure 15 mm Hg (IVC  diameter >2.1 cm with inspiratory collapse <50%)    Mitral valve   Normal structure with moderate regurgitation.       Tricuspid valve   Poor leaflet coaptation resulting in severe regurgitation.   Unable to accurately assess RVSP.       Aortic valve   Trileaflet.  Moderate sclerosis with normal cusp excursion. No stenosis.  Trace regurgitation.     Pulmonic valve   Normal structure with mild regurgitation.       Ascending aorta   Normal aortic root diameter.    HPI     Home Medications Prior to Admission medications   Medication Sig Start Date End Date Taking? Authorizing Provider  albuterol (VENTOLIN HFA) 108 (90 Base) MCG/ACT inhaler Inhale 2 puffs into the lungs every  4 (four) hours as needed for shortness of breath. 02/06/22   Hunsucker, Bonna Gains, MD  atorvastatin (LIPITOR) 40 MG tablet Take 40 mg by mouth at bedtime.    [provider]  cinacalcet (SENSIPAR) 30 MG tablet Take 30 mg by mouth daily. 04/09/22   [provider]  docusate calcium (SURFAK) 240 MG capsule Take 240 mg by mouth daily.    [provider]  Ensure (ENSURE) Take 237 mLs by mouth daily. Dialysis formula needed    [provider]  losartan (COZAAR) 50 MG tablet Take 25 mg by mouth daily.    [provider]  metoprolol succinate (TOPROL-XL) 25 MG 24 hr tablet Take 25 mg by mouth daily. 06/18/22   [provider]  oxyCODONE-acetaminophen (PERCOCET) 5-325 MG tablet Take 1 tablet by mouth every 6 (six) hours as needed for severe pain. 09/18/22   Rhyne, Hulen Shouts, PA-C  Tiotropium Bromide-Olodaterol (STIOLTO RESPIMAT) 2.5-2.5 MCG/ACT AERS Inhale 2 puffs into the lungs daily. Patient taking differently: Inhale 2 puffs into the lungs 2 (two) times daily. 04/04/22   Hunsucker, Bonna Gains, MD      Allergies    Patient has no known allergies.    Review of Systems   Review of Systems  Physical Exam Updated Vital Signs BP (!) 103/93   Pulse 89   Temp (!) 97.3 F (36.3 C) (  Temporal)   Resp (!) 30   Ht '5\' 8"'$  (1.727 m)   Wt 72 kg   BMI 24.14 kg/m  Physical Exam Constitutional:      General: He is not in acute distress. HENT:     Head: Normocephalic and atraumatic.  Eyes:     Conjunctiva/sclera: Conjunctivae normal.     Pupils: Pupils are equal, round, and reactive to light.  Cardiovascular:     Rate and Rhythm: Normal rate and regular rhythm.  Pulmonary:     Effort: Pulmonary effort is normal. No respiratory distress.     Comments: Bipap, breathing comfortably Diminished/absent breath sounds RLL, rhonchi LLL Abdominal:     General: There is no distension.     Tenderness: There is no abdominal tenderness.  Skin:    General: Skin  is warm and dry.  Neurological:     General: No focal deficit present.     Mental Status: He is alert. Mental status is at baseline.  Psychiatric:        Mood and Affect: Mood normal.        Behavior: Behavior normal.     ED Results / Procedures / Treatments   Labs (all labs ordered are listed, but only abnormal results are displayed) Labs Reviewed  CBC WITH DIFFERENTIAL/PLATELET - Abnormal; Notable for the following components:      Result Value   RBC 3.94 (*)    HCT 38.4 (*)    Lymphs Abs 0.6 (*)    All other components within normal limits  BRAIN NATRIURETIC PEPTIDE - Abnormal; Notable for the following components:   B Natriuretic Peptide >4,500.0 (*)    All other components within normal limits  BASIC METABOLIC PANEL - Abnormal; Notable for the following components:   Sodium 130 (*)    Chloride 88 (*)    Creatinine, Ser 3.50 (*)    Calcium 7.9 (*)    GFR, Estimated 17 (*)    All other components within normal limits  I-STAT ARTERIAL BLOOD GAS, ED - Abnormal; Notable for the following components:   pH, Arterial 7.564 (*)    pCO2 arterial 29.7 (*)    pO2, Arterial 300 (*)    Acid-Base Excess 5.0 (*)    Sodium 129 (*)    Calcium, Ion 0.96 (*)    All other components within normal limits  I-STAT CHEM 8, ED - Abnormal; Notable for the following components:   Sodium 128 (*)    Potassium 5.5 (*)    Chloride 92 (*)    Creatinine, Ser 3.40 (*)    Calcium, Ion 0.80 (*)    All other components within normal limits  TROPONIN I (HIGH SENSITIVITY) - Abnormal; Notable for the following components:   Troponin I (High Sensitivity) 45 (*)    All other components within normal limits  RESP PANEL BY RT-PCR (RSV, FLU A&B, COVID)  RVPGX2  BLOOD GAS, ARTERIAL  CBC  HEPATITIS B SURFACE ANTIGEN  HEPATITIS B SURFACE ANTIBODY, QUANTITATIVE    EKG EKG Interpretation  Date/Time:  Tuesday October 08 2022 17:44:54 EST Ventricular Rate:  93 PR Interval:  175 QRS Duration: 142 QT  Interval:  446 QTC Calculation: 555 R Axis:   256 Text Interpretation: Sinus rhythm Multiform ventricular premature complexes IVCD, consider atypical RBBB Anterior infarct, old Confirmed by Octaviano Glow [phone]) on 09/25/2022 5:47:17 PM  Radiology DG Chest Port 1 View  Result Date: 10/02/2022 CLINICAL DATA:  Triage notes:"Pt arrived via [facility] for shortness of breath from  home. Pt was on couch in apparent distress, accessory muscle use, extended exhalation. Pt reports shortness of breath post dialysis. EMS report expiratory wheezing EXAM: PORTABLE CHEST - 1 VIEW COMPARISON:  09/11/2022 FINDINGS: Stable tunneled right IJ hemodialysis catheter to the cavoatrial junction. Persistent moderate right pleural effusion with atelectasis/consolidation at the right lung base. Left lung clear. Stable cardiomegaly. No pneumothorax. Orthopedic anchor in the right humeral head. IMPRESSION: Persistent moderate right pleural effusion with right basilar atelectasis/consolidation. Electronically Signed   By: Lucrezia Europe M.D.   On: 10/19/2022 17:45    Procedures .Critical Care  Performed by: Wyvonnia Dusky, MD Authorized by: Wyvonnia Dusky, MD   Critical care provider statement:    Critical care time (minutes):  45   Critical care time was exclusive of:  Separately billable procedures and treating other patients   Critical care was necessary to treat or prevent imminent or life-threatening deterioration of the following conditions:  Respiratory failure   Critical care was time spent personally by me on the following activities:  Ordering and performing treatments and interventions, ordering and review of laboratory studies, ordering and review of radiographic studies, pulse oximetry, review of old charts, examination of patient and evaluation of patient's response to treatment   Care discussed with: admitting provider   Comments:     SOB, bipap     Medications Ordered in ED Medications  ceFEPIme  (MAXIPIME) 1 g in sodium chloride 0.9 % 100 mL IVPB (0 g Intravenous Stopped 10/18/2022 2228)  vancomycin (VANCOREADY) IVPB 1500 mg/300 mL (has no administration in time range)  vancomycin (VANCOREADY) IVPB 750 mg/150 mL (has no administration in time range)  heparin injection 5,000 Units (has no administration in time range)  Chlorhexidine Gluconate Cloth 2 % PADS 6 each (has no administration in time range)  furosemide (LASIX) injection 80 mg (80 mg Intravenous Given 10/10/2022 2108)    ED Course/ Medical Decision Making/ A&P Clinical Course as of 10/13/2022 2231  Tue Oct 08, 2022  2025 Patient was trialed off of BiPAP, maintain oxygen saturations on 6 L nasal cannula, but when I evaluated him within an hour, he had increased work of breathing again, with use of abdominal muscles.  He continues to have rhonchi and bilateral breath sounds, consistent with pulmonary edema, and also concern for pleural effusion on the right side.  We will put him back on BiPAP where he is more comfortable, and admit him to the hospital overnight.  May benefit from IR thoracentesis tomorrow, versus repeat dialysis. [MT]  2043 I spoke to Dr Moshe Cipro from nephrology who will evaluate the patient for dialysis, likely tomorrow.  I do believe the patient is stable on BiPAP overnight. [MT]  2121 Admitted to Dr Nevada Crane hospitalist [MT]    Clinical Course User Index [MT] Langston Masker Carola Rhine, MD                             Medical Decision Making Amount and/or Complexity of Data Reviewed Labs: ordered.  Risk Prescription drug management. Decision regarding hospitalization.   This patient presents to the ED with concern for shortness of breath. This involves an extensive number of treatment options, and is a complaint that carries with it a high risk of complications and morbidity.  The differential diagnosis includes pneumonia versus pleural effusion versus COPD exacerbation for CHF exacerbation versus  other  Co-morbidities that complicate the patient evaluation: History of recurring pleural effusion and end-stage  renal disease at high risk for pulmonary congestion  Additional history obtained from EMS  External records from outside source obtained and reviewed including hospital discharge summary as last year for recurring hospitalization and thoracentesis of right pleural effusion  I ordered and personally interpreted labs.  The pertinent results include: Potassium within normal limits 4.0.  Venous gas showing appropriate oxygenation on the BiPAP.  No leukocytosis.  BNP chronically elevated.  COVID and flu are negative  I ordered imaging studies including x-ray of the chest I independently visualized and interpreted imaging which showed right-sided pleural effusion or consolidation I agree with the radiologist interpretation  The patient was maintained on a cardiac monitor.  I personally viewed and interpreted the cardiac monitored which showed an underlying rhythm of: Sinus rhythm  Per my interpretation the patient's ECG shows no acute ischemic findings  I ordered medication including IV vancomycin and cefepime for HCAP coverage.  I have reviewed the patients home medicines and have made adjustments as needed  Test Considered: Low suspicion for acute PE in this clinical setting   After the interventions noted above, I reevaluated the patient and found that they have: stayed the same  He remained stable while on the BiPAP.  He is requiring this overnight.   Dispostion:  After consideration of the diagnostic results and the patients response to treatment, I feel that the patent would benefit from medical admission         Final Clinical Impression(s) / ED Diagnoses Final diagnoses:  Pleural effusion  HCAP (healthcare-associated pneumonia)    Rx / DC Orders ED Discharge Orders     None         Wyvonnia Dusky, MD 09/26/2022 2234

## 2022-10-08 NOTE — Progress Notes (Signed)
Pharmacy Antibiotic Note  [name]. is a 82 y.o. male admitted on 10/14/2022 with  pneumonia .  Pharmacy has been consulted for Vancomycin and Cefepime dosing. Pt with ESRD - usual T/T/S HD  Plan: Cefepime 1gm IV q24h Vancomycin '1500mg'$  IV now then 750 mg IV qHD Will f/u HD schedule/tolerance, micro data, and pt's clinical condition Vanc level prn   Height: '5\' 8"'$  (172.7 cm) Weight: 72 kg (158 lb 11.7 oz) IBW/kg (Calculated) : 68.4  Temp (24hrs), Avg:96.2 F (35.7 C), Min:96.2 F (35.7 C), Max:96.2 F (35.7 C)  Recent Labs  Lab 10/02/22 1350 10/22/2022 1744 09/28/2022 1747  WBC  --   --  5.5  CREATININE 4.30* 3.40*  --     Estimated Creatinine Clearance: 16.8 mL/min (A) (by C-G formula based on SCr of 3.4 mg/dL (H)).    No Known Allergies  Antimicrobials this admission: 1/16 Vanc >>  1/16 Cefepime >>   Microbiology results: Pending  Thank you for allowing pharmacy to be a part of this patient's care.  Sherlon Handing, PharmD, BCPS Please see amion for complete clinical pharmacist phone list 10/16/2022 8:31 PM

## 2022-10-08 NOTE — ED Notes (Signed)
MD aware of BNP

## 2022-10-08 NOTE — ED Notes (Signed)
Pt taken off bipap by RT, placed on 4L [place]. IV team at bedside.

## 2022-10-08 NOTE — ED Triage Notes (Addendum)
Pt arrived via [facility] for shortness of breath from home. Pt was on couch in apparent distress, accessory muscle use, extended exhalation. Pt reports shortness of breath post dialysis. EMS report expiratory wheezing and crackles in lower lung bases. PMH COPD, CHF. Duo neb and placed pt on CPAP with improvement in symptoms. Tu, Th, Sat dialysis, normal session today.  PTA EMS Vitals HR 90 BP 104/68 SPO2 Unable to obtain RR 25

## 2022-10-08 NOTE — Progress Notes (Signed)
Was called about this patient-  ESRD and HF with dec EF-  completed HD today at the [facility] but after he got home started to develop SOB-  hypoxic in the ER requiring much supplemental O2-  CXR with pleural effusion.  Lasix 80 mg iv given tonight and  I will put in orders for HD/UF only  preferably early tomorrow as a extra treatment.  Full consult to be done tomorrow   Louis Meckel

## 2022-10-08 NOTE — ED Notes (Signed)
Pt reports historic difficulty of pulse ox monitors not reading. Staff unable to obtain reading at this time. Heat backs, room temp increase, and warm blankets applied. Extremities cool.

## 2022-10-08 NOTE — ED Notes (Signed)
RN attempt x2, PA attempt with [place] x1 unsuccessful. IV team consult placed.

## 2022-10-09 DIAGNOSIS — J189 Pneumonia, unspecified organism: Secondary | ICD-10-CM

## 2022-10-09 DIAGNOSIS — J90 Pleural effusion, not elsewhere classified: Secondary | ICD-10-CM

## 2022-10-09 DIAGNOSIS — R0603 Acute respiratory distress: Secondary | ICD-10-CM | POA: Diagnosis not present

## 2022-10-09 DIAGNOSIS — N186 End stage renal disease: Secondary | ICD-10-CM

## 2022-10-09 LAB — MRSA NEXT GEN BY PCR, NASAL: MRSA by PCR Next Gen: NOT DETECTED

## 2022-10-09 LAB — CBC
HCT: 36.2 % — ABNORMAL LOW (ref 39.0–52.0)
Hemoglobin: 12.1 g/dL — ABNORMAL LOW (ref 13.0–17.0)
MCH: 32.7 pg (ref 26.0–34.0)
MCHC: 33.4 g/dL (ref 30.0–36.0)
MCV: 97.8 fL (ref 80.0–100.0)
Platelets: 134 10*3/uL — ABNORMAL LOW (ref 150–400)
RBC: 3.7 MIL/uL — ABNORMAL LOW (ref 4.22–5.81)
RDW: 15.1 % (ref 11.5–15.5)
WBC: 6.4 10*3/uL (ref 4.0–10.5)
nRBC: 0 % (ref 0.0–0.2)

## 2022-10-09 LAB — PROCALCITONIN: Procalcitonin: 0.29 ng/mL

## 2022-10-09 LAB — COMPREHENSIVE METABOLIC PANEL
ALT: 11 U/L (ref 0–44)
AST: 27 U/L (ref 15–41)
Albumin: 3.4 g/dL — ABNORMAL LOW (ref 3.5–5.0)
Alkaline Phosphatase: 63 U/L (ref 38–126)
Anion gap: 13 (ref 5–15)
BUN: 19 mg/dL (ref 8–23)
CO2: 27 mmol/L (ref 22–32)
Calcium: 7.7 mg/dL — ABNORMAL LOW (ref 8.9–10.3)
Chloride: 89 mmol/L — ABNORMAL LOW (ref 98–111)
Creatinine, Ser: 3.94 mg/dL — ABNORMAL HIGH (ref 0.61–1.24)
GFR, Estimated: 15 mL/min — ABNORMAL LOW (ref >60)
Glucose, Bld: 141 mg/dL — ABNORMAL HIGH (ref 70–99)
Potassium: 4 mmol/L (ref 3.5–5.1)
Sodium: 129 mmol/L — ABNORMAL LOW (ref 135–145)
Total Bilirubin: 1.2 mg/dL (ref 0.3–1.2)
Total Protein: 7.7 g/dL (ref 6.5–8.1)

## 2022-10-09 LAB — PHOSPHORUS: Phosphorus: 2.7 mg/dL (ref 2.5–4.6)

## 2022-10-09 LAB — MAGNESIUM: Magnesium: 1.6 mg/dL — ABNORMAL LOW (ref 1.7–2.4)

## 2022-10-09 LAB — LACTATE DEHYDROGENASE: LDH: 296 U/L — ABNORMAL HIGH (ref 98–192)

## 2022-10-09 MED ORDER — HYDROCERIN EX CREA
TOPICAL_CREAM | Freq: Three times a day (TID) | CUTANEOUS | Status: DC
  Administered 2022-10-09 – 2022-10-17 (×3): 1 via TOPICAL
  Filled 2022-10-09 (×3): qty 113

## 2022-10-09 MED ORDER — IPRATROPIUM-ALBUTEROL 0.5-2.5 (3) MG/3ML IN SOLN
3.0000 mL | Freq: Four times a day (QID) | RESPIRATORY_TRACT | Status: DC
  Administered 2022-10-09 – 2022-10-12 (×12): 3 mL via RESPIRATORY_TRACT
  Filled 2022-10-09 (×13): qty 3

## 2022-10-09 MED ORDER — VANCOMYCIN HCL 750 MG/150ML IV SOLN
750.0000 mg | Freq: Once | INTRAVENOUS | Status: AC
  Administered 2022-10-09: 750 mg via INTRAVENOUS
  Filled 2022-10-09: qty 150

## 2022-10-09 MED ORDER — ALBUTEROL SULFATE (2.5 MG/3ML) 0.083% IN NEBU
2.5000 mg | INHALATION_SOLUTION | RESPIRATORY_TRACT | Status: DC | PRN
  Administered 2022-10-09 – 2022-10-16 (×6): 2.5 mg via RESPIRATORY_TRACT
  Filled 2022-10-09 (×6): qty 3

## 2022-10-09 MED ORDER — WHITE PETROLATUM EX OINT: TOPICAL_OINTMENT | CUTANEOUS | Status: DC | PRN

## 2022-10-09 MED ORDER — MAGNESIUM SULFATE 2 GM/50ML IV SOLN
2.0000 g | Freq: Once | INTRAVENOUS | Status: AC
  Administered 2022-10-09: 2 g via INTRAVENOUS
  Filled 2022-10-09: qty 50

## 2022-10-09 MED ORDER — METHYLPREDNISOLONE SODIUM SUCC 40 MG IJ SOLR
40.0000 mg | Freq: Two times a day (BID) | INTRAMUSCULAR | Status: DC
  Administered 2022-10-09 – 2022-10-11 (×4): 40 mg via INTRAVENOUS
  Filled 2022-10-09 (×4): qty 1

## 2022-10-09 MED ORDER — ATORVASTATIN CALCIUM 40 MG PO TABS
40.0000 mg | ORAL_TABLET | Freq: Every day | ORAL | Status: DC
  Administered 2022-10-09 – 2022-10-19 (×11): 40 mg via ORAL
  Filled 2022-10-09 (×11): qty 1

## 2022-10-09 MED ORDER — METHYLPREDNISOLONE SODIUM SUCC 40 MG IJ SOLR: 40.0000 mg | Freq: Two times a day (BID) | INTRAMUSCULAR | Status: DC

## 2022-10-09 MED ORDER — ALBUMIN HUMAN 25 % IV SOLN: 12.5000 g | Freq: Once | INTRAVENOUS | Status: DC

## 2022-10-09 NOTE — Progress Notes (Signed)
PROGRESS NOTE        PATIENT DETAILS Name: [name]. Age: 82 y.o. Sex: male Date of Birth: 1940-11-06 Admit Date: 10/23/2022 Admitting Physician Kayleen Memos, DO QQI:[id number], [place], MD  Brief Summary: Patient is a 82 y.o.  male with history of ESRD on HD TTS, HFrEF, COPD, HTN, HLD who presented with SOB x 1 day.  Patient was found to have acute hypoxic respiratory failure-requiring BiPAP on initial presentation.  Significant events: 1/16>> SOB-hypoxia-requiring BiPAP.  Last HD on 1/16 as an outpatient.  Significant studies: 1/16>> CXR: Moderate right pleural effusion-right basilar consolidation.  Significant microbiology data: 1/16>> COVID/influenza/RSV PCR: Negative  Procedures: None  Consults: Nephrology  Subjective: Wheezing-but appears comfortable.  Feels better than yesterday.  Liberated off BiPAP overnight.  On 4 L of oxygen.  Objective: Vitals: Blood pressure 104/78, pulse 95, temperature 98.7 F (37.1 C), temperature source Oral, resp. rate 18, height '5\' 8"'$  (1.727 m), weight 72 kg, SpO2 95 %.   Exam: Gen Exam:Alert awake-not in any distress HEENT:atraumatic, normocephalic Chest: Moving air bilaterally but coarse rhonchi all over. CVS:S1S2 regular Abdomen:soft non tender, non distended Extremities: Trace ankle edema bilaterally Neurology: Non focal Skin: no rash-but extensive disclamation of skin in bilateral lower extremities.  Pertinent Labs/Radiology:    Latest Ref Rng & Units 10/09/2022    4:10 AM 09/30/2022   10:40 PM 10/03/2022    6:04 PM  CBC  WBC 4.0 - 10.5 K/uL 6.4  5.4    Hemoglobin 13.0 - 17.0 g/dL 12.1  12.7  13.6   Hematocrit 39.0 - 52.0 % 36.2  37.7  40.0   Platelets 150 - 400 K/uL 134  129      Lab Results  Component Value Date   NA 129 (L) 10/09/2022   K 4.0 10/09/2022   CL 89 (L) 10/09/2022   CO2 27 10/09/2022      Assessment/Plan: Acute hypoxic respiratory failure secondary to right lobar  pneumonia/right pleural effusion-and and COPD exacerbation Overall improved-initially on BiPAP-transition to 4 L of oxygen today Continue empiric antibiotics Course wheezing/rhonchi all over-add scheduled bronchodilators/steroids for a few days Unclear if there is some pulm edema playing a role-however patient just had a full 4-hour dialysis as an outpatient yesterday-there is no gross volume overload on exam.  Will see if his breathing improves post HD. Thoracocentesis ordered by admitting MD-given concern for PNA-reasonable to rule out parapneumonic effusion-although this is likely to be a transudate.  Chronic HFrEF No obvious volume overload on exam-no significant pulm edema seen on CXR Volume removal with HD  Resume losartan/beta-blocker over the next few days if BP stable.  ESRD on HD TTS Nephrology following-plans are for extra HD today.  Hyponatremia Mild-asymptomatic Should improved with hemodialysis Emphasized fluid restriction  Hypomagnesemia Replete/recheck  HTN BP currently soft in the low 119E systolic Metoprolol/losartan on hold-resume over the next few days  HLD Resume statin  Desquamation of skin of bilateral lower extremities Trial of eucerin ointment  BMI: Estimated body mass index is 24.14 kg/m as calculated from the following:   Height as of this encounter: '5\' 8"'$  (1.727 m).   Weight as of this encounter: 72 kg.   Code status:   Code Status: Full Code   DVT Prophylaxis: heparin injection 5,000 Units Start: 10/14/2022 2200   Family Communication: None at bedside   Disposition  Plan: Status is: Inpatient Remains inpatient appropriate because: Severity of illness.   Planned Discharge Destination:Home   Diet: Diet Order             Diet renal with fluid restriction Fluid restriction: 1200 mL Fluid; Room service appropriate? Yes; Fluid consistency: Thin  Diet effective now                     Antimicrobial agents: Anti-infectives (From  admission, onward)    Start     Dose/Rate Route Frequency Ordered Stop   10/10/22 1200  vancomycin (VANCOREADY) IVPB 750 mg/150 mL        750 mg 150 mL/hr over 60 Minutes Intravenous Every T-Th-Sa (Hemodialysis) 10/15/2022 2036     10/19/2022 2045  ceFEPIme (MAXIPIME) 1 g in sodium chloride 0.9 % 100 mL IVPB        1 g 200 mL/hr over 30 Minutes Intravenous Daily at bedtime 09/26/2022 2036     09/27/2022 2045  vancomycin (VANCOREADY) IVPB 1500 mg/300 mL        1,500 mg 150 mL/hr over 120 Minutes Intravenous NOW 10/16/2022 2036 10/09/22 0058        MEDICATIONS: Scheduled Meds:  Chlorhexidine Gluconate Cloth  6 each Topical Q0600   heparin  5,000 Units Subcutaneous Q8H   ipratropium-albuterol  3 mL Nebulization Q6H   Continuous Infusions:  anticoagulant sodium citrate     ceFEPime (MAXIPIME) IV Stopped (09/24/2022 2228)   magnesium sulfate bolus IVPB 2 g (10/09/22 0740)   [START ON 10/10/2022] vancomycin     PRN Meds:.albuterol, alteplase, anticoagulant sodium citrate, heparin, heparin, lidocaine (PF), lidocaine-prilocaine, pentafluoroprop-tetrafluoroeth   I have personally reviewed following labs and imaging studies  LABORATORY DATA: CBC: Recent Labs  Lab 10/06/2022 1744 09/27/2022 1747 10/13/2022 1804 09/29/2022 2240 10/09/22 0410  WBC  --  5.5  --  5.4 6.4  NEUTROABS  --  4.0  --   --   --   HGB 14.6 13.1 13.6 12.7* 12.1*  HCT 43.0 38.4* 40.0 37.7* 36.2*  MCV  --  97.5  --  97.4 97.8  PLT  --  201  --  129* 134*    Basic Metabolic Panel: Recent Labs  Lab 10/02/22 1350 10/20/2022 1744 10/18/2022 1804 10/03/2022 2128 10/07/2022 2240 10/09/22 0410  NA 130* 128* 129* 130* 131* 129*  K 4.2 5.5* 3.6 4.0 4.1 4.0  CL 89* 92*  --  88* 92* 89*  CO2  --   --   --  '28 25 27  '$ GLUCOSE 73 88  --  82 83 141*  BUN 19 20  --  '15 16 19  '$ CREATININE 4.30* 3.40*  --  3.50* 3.47* 3.94*  CALCIUM  --   --   --  7.9* 7.7* 7.7*  MG  --   --   --   --   --  1.6*  PHOS  --   --   --   --  2.5 2.7     GFR: Estimated Creatinine Clearance: 14.5 mL/min (A) (by C-G formula based on SCr of 3.94 mg/dL (H)).  Liver Function Tests: Recent Labs  Lab 10/10/2022 2240 10/09/22 0410  AST  --  27  ALT  --  11  ALKPHOS  --  63  BILITOT  --  1.2  PROT  --  7.7  ALBUMIN 3.4* 3.4*   No results for input(s): "LIPASE", "AMYLASE" in the last 168 hours. No results for input(s): "AMMONIA" in the  last 168 hours.  Coagulation Profile: No results for input(s): "INR", "PROTIME" in the last 168 hours.  Cardiac Enzymes: No results for input(s): "CKTOTAL", "CKMB", "CKMBINDEX", "TROPONINI" in the last 168 hours.  BNP (last 3 results) No results for input(s): "PROBNP" in the last 8760 hours.  Lipid Profile: No results for input(s): "CHOL", "HDL", "LDLCALC", "TRIG", "CHOLHDL", "LDLDIRECT" in the last 72 hours.  Thyroid Function Tests: No results for input(s): "TSH", "T4TOTAL", "FREET4", "T3FREE", "THYROIDAB" in the last 72 hours.  Anemia Panel: No results for input(s): "VITAMINB12", "FOLATE", "FERRITIN", "TIBC", "IRON", "RETICCTPCT" in the last 72 hours.  Urine analysis:    Component Value Date/Time   COLORURINE YELLOW 03/15/2019 [place] 03/15/2019 1644   LABSPEC 1.013 03/15/2019 1644   PHURINE 5.0 03/15/2019 1644   GLUCOSEU 50 (A) 03/15/2019 1644   HGBUR SMALL (A) 03/15/2019 1644   BILIRUBINUR NEGATIVE 03/15/2019 1644   KETONESUR NEGATIVE 03/15/2019 1644   PROTEINUR >=300 (A) 03/15/2019 1644   NITRITE NEGATIVE 03/15/2019 1644   LEUKOCYTESUR NEGATIVE 03/15/2019 1644    Sepsis Labs: Lactic Acid, Venous    Component Value Date/Time   LATICACIDVEN 1.6 06/25/2021 1707    MICROBIOLOGY: Recent Results (from the past 240 hour(s))  Resp panel by RT-PCR (RSV, Flu A&B, Covid) Anterior Nasal Swab     Status: None   Collection Time: 10/01/2022  6:46 PM   Specimen: Anterior Nasal Swab  Result Value Ref Range Status   SARS Coronavirus 2 by RT PCR NEGATIVE NEGATIVE Final     Comment: (NOTE) SARS-CoV-2 target nucleic acids are NOT DETECTED.  The SARS-CoV-2 RNA is generally detectable in upper respiratory specimens during the acute phase of infection. The lowest concentration of SARS-CoV-2 viral copies this assay can detect is 138 copies/mL. A negative result does not preclude SARS-Cov-2 infection and should not be used as the sole basis for treatment or other patient management decisions. A negative result may occur with  improper specimen collection/handling, submission of specimen other than nasopharyngeal swab, presence of viral mutation(s) within the areas targeted by this assay, and inadequate number of viral copies(<138 copies/mL). A negative result must be combined with clinical observations, patient history, and epidemiological information. The expected result is Negative.  Fact Sheet for Patients:  EntrepreneurPulse.com.au  Fact Sheet for Healthcare Providers:  IncredibleEmployment.be  This test is no t yet approved or cleared by the [place] FDA and  has been authorized for detection and/or diagnosis of SARS-CoV-2 by FDA under an Emergency Use Authorization (EUA). This EUA will remain  in effect (meaning this test can be used) for the duration of the COVID-19 declaration under Section 564(b)(1) of the Act, 21 U.S.C.section 360bbb-3(b)(1), unless the authorization is terminated  or revoked sooner.       Influenza A by PCR NEGATIVE NEGATIVE Final   Influenza B by PCR NEGATIVE NEGATIVE Final    Comment: (NOTE) The Xpert Xpress SARS-CoV-2/FLU/RSV plus assay is intended as an aid in the diagnosis of influenza from Nasopharyngeal swab specimens and should not be used as a sole basis for treatment. Nasal washings and aspirates are unacceptable for Xpert Xpress SARS-CoV-2/FLU/RSV testing.  Fact Sheet for Patients: EntrepreneurPulse.com.au  Fact Sheet for Healthcare  Providers: IncredibleEmployment.be  This test is not yet approved or cleared by the [place] FDA and has been authorized for detection and/or diagnosis of SARS-CoV-2 by FDA under an Emergency Use Authorization (EUA). This EUA will remain in effect (meaning this test can be used) for the duration of  the COVID-19 declaration under Section 564(b)(1) of the Act, 21 U.S.C. section 360bbb-3(b)(1), unless the authorization is terminated or revoked.     Resp Syncytial Virus by PCR NEGATIVE NEGATIVE Final    Comment: (NOTE) Fact Sheet for Patients: EntrepreneurPulse.com.au  Fact Sheet for Healthcare Providers: IncredibleEmployment.be  This test is not yet approved or cleared by the [place] FDA and has been authorized for detection and/or diagnosis of SARS-CoV-2 by FDA under an Emergency Use Authorization (EUA). This EUA will remain in effect (meaning this test can be used) for the duration of the COVID-19 declaration under Section 564(b)(1) of the Act, 21 U.S.C. section 360bbb-3(b)(1), unless the authorization is terminated or revoked.  Performed at [facility], [place] [address] [place], [place] [address]     RADIOLOGY STUDIES/RESULTS: DG Chest Port 1 View  Result Date: 10/07/2022 CLINICAL DATA:  Triage notes:"Pt arrived via [facility] for shortness of breath from home. Pt was on couch in apparent distress, accessory muscle use, extended exhalation. Pt reports shortness of breath post dialysis. EMS report expiratory wheezing EXAM: PORTABLE CHEST - 1 VIEW COMPARISON:  09/11/2022 FINDINGS: Stable tunneled right IJ hemodialysis catheter to the cavoatrial junction. Persistent moderate right pleural effusion with atelectasis/consolidation at the right lung base. Left lung clear. Stable cardiomegaly. No pneumothorax. Orthopedic anchor in the right humeral head. IMPRESSION: Persistent moderate right pleural effusion with right  basilar atelectasis/consolidation. Electronically Signed   By: Lucrezia Europe M.D.   On: 10/01/2022 17:45     LOS: 1 day   Oren Binet, MD  [facility]    To contact the attending provider between 7A-7P or the covering provider during after hours 7P-7A, please log into the web site [email or web address] and access using universal [place] password for that web site. If you do not have the password, please call the hospital operator.  10/09/2022, 8:11 AM

## 2022-10-09 NOTE — ED Notes (Signed)
Pt back from Dialysis.

## 2022-10-09 NOTE — Consult Note (Signed)
[facility] Renal Consultation Note    Indication for Consultation:  Management of ESRD/hemodialysis; anemia, hypertension/volume and secondary hyperparathyroidism   HPI: [name]. is a 82 y.o. male with ESRD on HD, HFrEF 15-20%, HLD, HTN who is admitted with acute hypoxic respiratory failure. He became SOB at home after dialysis on Tuesday. Hypoxic with increaased WOB - placed on BiPAP. CXR showed pulmonary edema and bilateral pleural effusions. Empric antibitiocs started to cover pneumonia. Got IV Lasix. Nephrology consulted for urgent HD.   Labs Na 129, K 4.0, CO 27, BUN 19 Cr 3.9, WBC 6.4, Hb 12.1. Respiratory panel negative.   Dialysis TTS at  [facility] in [place]. Dialysis via [facility]. Has LUE AVF that he reports is clotted. His last treatment was Tuesday. Reports that he completed a full treatment.Thinks he has been getting to his dry weight.  Seen and examined in HD unit. UF goal 3L. BP 97/80 on 4L .  Says he doesn't use any supplement oxygen at home.   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[place])    Obesity    Prostate cancer ([place])    Tobacco abuse counseling    Past Surgical History:  Procedure Laterality Date   AV FISTULA PLACEMENT Left 04/26/2019   Procedure: INSERTION OF ARTERIOVENOUS (AV) GORE-TEX GRAFT ARM - using Gore Vascular Graft size 45cm;  Surgeon: Rosetta Posner, MD;  Location: [facility];  Service: Vascular;  Laterality: Left;   AV FISTULA PLACEMENT Left 09/18/2022   Procedure: INSERTION OF LEFT ARM ARTERIOVENOUS (AV) GORE-TEX GRAFT;  Surgeon: Serafina Mitchell, MD;  Location: [place];  Service: Vascular;  Laterality: Left;  with regional block   CARDIAC CATHETERIZATION  02/08/02   CARDIAC CATHETERIZATION   11/20/10   drug eluting stent;mid left LAD, There is mild hypokinesis of the anterolateral wall. LVEF  is estimated at 50-55%   INSERTION OF BRONCHIAL STENT     went in groin to R chest   INSERTION OF DIALYSIS CATHETER Right 04/26/2019   Procedure: INSERTION OF HEMODIALYSIS CATHETER - using Palindrome Catheter size 23cm;  Surgeon: Rosetta Posner, MD;  Location: [place];  Service: Vascular;  Laterality: Right;   LEFT HEART CATH AND CORONARY ANGIOGRAPHY N/A 04/23/2019   Procedure: LEFT HEART CATH AND CORONARY ANGIOGRAPHY;  Surgeon: Sherren Mocha, MD;  Location: [facility];  Service: Cardiovascular;  Laterality: N/A;   PENILE PROSTHESIS IMPLANT     '05   THORACENTESIS Right 04/10/2022   Procedure: Mathews Robinsons;  Surgeon: Lanier Clam, MD;  Location: [facility] END[facility]COPY;  Service: Pulmonary;  Laterality: Right;   Family History  Problem Relation Age of Onset   Aneurysm Mother    Multiple myeloma Mother    Aneurysm Father    Cancer Brother    Prostate cancer Brother    Cancer Other    Prostate cancer Brother    Bone cancer Brother    Breast cancer Neg Hx    Colon cancer Neg Hx    Pancreatic cancer Neg Hx    Social History:  reports that he has quit smoking. His smoking use included cigarettes. He has a 12.50 pack-year smoking history. He has never been exposed to  tobacco smoke. He has never used smokeless tobacco. He reports that he does not currently use alcohol. He reports that he does not currently use drugs after having used the following drugs: Cocaine and Marijuana. No Known Allergies Prior to Admission medications   Medication Sig Start Date End Date Taking? Authorizing Provider  albuterol (VENTOLIN HFA) 108 (90 Base) MCG/ACT inhaler Inhale 2 puffs into the lungs every 4 (four) hours as needed for shortness of breath. 02/06/22   Hunsucker, Bonna Gains, MD  atorvastatin (LIPITOR) 40 MG tablet Take 40 mg by mouth at bedtime.    [provider]  cinacalcet (SENSIPAR) 30 MG  tablet Take 30 mg by mouth daily. 04/09/22   [provider]  docusate calcium (SURFAK) 240 MG capsule Take 240 mg by mouth daily.    [provider]  Ensure (ENSURE) Take 237 mLs by mouth daily. Dialysis formula needed    [provider]  losartan (COZAAR) 50 MG tablet Take 25 mg by mouth daily.    [provider]  metoprolol succinate (TOPROL-XL) 25 MG 24 hr tablet Take 25 mg by mouth daily. 06/18/22   [provider]  oxyCODONE-acetaminophen (PERCOCET) 5-325 MG tablet Take 1 tablet by mouth every 6 (six) hours as needed for severe pain. 09/18/22   Rhyne, Hulen Shouts, PA-C  Tiotropium Bromide-Olodaterol (STIOLTO RESPIMAT) 2.5-2.5 MCG/ACT AERS Inhale 2 puffs into the lungs daily. Patient taking differently: Inhale 2 puffs into the lungs 2 (two) times daily. 04/04/22   Hunsucker, Bonna Gains, MD   Current Facility-Administered Medications  Medication Dose Route Frequency Provider Last Rate Last Admin   albuterol (PROVENTIL) (2.5 MG/3ML) 0.083% nebulizer solution 2.5 mg  2.5 mg Nebulization Q2H PRN Jonetta Osgood, MD   2.5 mg at 10/09/22 0732   alteplase (CATHFLO ACTIVASE) injection 2 mg  2 mg Intracatheter Once PRN Corliss Parish, MD       anticoagulant sodium citrate solution 5 mL  5 mL Intracatheter PRN Corliss Parish, MD       atorvastatin (LIPITOR) tablet 40 mg  40 mg Oral QHS Ghimire, Shanker M, MD       ceFEPIme (MAXIPIME) 1 g in sodium chloride 0.9 % 100 mL IVPB  1 g Intravenous QHS Franky Macho, RPH   Stopped at 10/07/2022 2228   Chlorhexidine Gluconate Cloth 2 % PADS 6 each  6 each Topical Q0600 Corliss Parish, MD       heparin injection 1,000 Units  1,000 Units Intracatheter PRN Corliss Parish, MD       heparin injection 1,400 Units  20 Units/kg Dialysis PRN Corliss Parish, MD   1,400 Units at 10/09/22 0816   heparin injection 5,000 Units  5,000 Units Subcutaneous Q8H Kayleen Memos, DO   5,000 Units at 10/09/22  0623   hydrocerin (EUCERIN) cream   Topical TID Jonetta Osgood, MD       ipratropium-albuterol (DUONEB) 0.5-2.5 (3) MG/3ML nebulizer solution 3 mL  3 mL Nebulization Q6H Ghimire, Henreitta Leber, MD   3 mL at 10/09/22 0732   lidocaine (PF) (XYLOCAINE) 1 % injection 5 mL  5 mL Intradermal PRN Corliss Parish, MD       lidocaine-prilocaine (EMLA) cream 1 Application  1 Application Topical PRN Corliss Parish, MD       methylPREDNISolone sodium succinate (SOLU-MEDROL) 40 mg/mL injection 40 mg  40 mg Intravenous Q12H Ghimire, Henreitta Leber, MD       pentafluoroprop-tetrafluoroeth (GEBAUERS) aerosol 1 Application  1 Application Topical PRN Moshe Cipro,  Lambert Keto, MD       [START ON 10/10/2022] vancomycin (VANCOREADY) IVPB 750 mg/150 mL  750 mg Intravenous Q T,Th,Sa-HD Amend, Sanda Klein, RPH       white petrolatum (VASELINE) gel   Topical PRN Ghimire, Henreitta Leber, MD       Current Outpatient Medications  Medication Sig Dispense Refill   albuterol (VENTOLIN HFA) 108 (90 Base) MCG/ACT inhaler Inhale 2 puffs into the lungs every 4 (four) hours as needed for shortness of breath. 18 g 5   atorvastatin (LIPITOR) 40 MG tablet Take 40 mg by mouth at bedtime.     cinacalcet (SENSIPAR) 30 MG tablet Take 30 mg by mouth daily.     docusate calcium (SURFAK) 240 MG capsule Take 240 mg by mouth daily.     Ensure (ENSURE) Take 237 mLs by mouth daily. Dialysis formula needed     losartan (COZAAR) 50 MG tablet Take 25 mg by mouth daily.     metoprolol succinate (TOPROL-XL) 25 MG 24 hr tablet Take 25 mg by mouth daily.     oxyCODONE-acetaminophen (PERCOCET) 5-325 MG tablet Take 1 tablet by mouth every 6 (six) hours as needed for severe pain. 20 tablet 0   Tiotropium Bromide-Olodaterol (STIOLTO RESPIMAT) 2.5-2.5 MCG/ACT AERS Inhale 2 puffs into the lungs daily. (Patient taking differently: Inhale 2 puffs into the lungs 2 (two) times daily.) 1 each 11     ROS: As per HPI otherwise negative.  Physical Exam: Vitals:    10/09/22 0804 10/09/22 0822 10/09/22 0830 10/09/22 0900  BP: 104/78 100/85 100/78 97/80  Pulse: 95 94 93 91  Resp: 18 (!) 22 18 (!) 22  Temp: 98.7 F (37.1 C)     TempSrc: Oral     SpO2: 95% 97%  95%  Weight: 84.6 kg     Height:         General: Lying in stretcher, on nasal oxygen  Head: NCAT sclera not icteric MMM Neck: Supple. Trachea midline  Lungs: bilataeral rales at bases  Heart: RRR, no murmur, rub, or gallop  Abdomen: soft non-tender, bowel sounds normal, no masses  Lower extremities: pitting edema to bilateral LE Neuro: A & O X 3. Moves all extremities spontaneously. Psych:  Responds to questions appropriately with a normal affect. Dialysis Access: R [facility]   Labs: Basic Metabolic Panel: Recent Labs  Lab 09/29/2022 2128 10/16/2022 2240 10/09/22 0410  NA 130* 131* 129*  K 4.0 4.1 4.0  CL 88* 92* 89*  CO2 '28 25 27  '$ GLUCOSE 82 83 141*  BUN '15 16 19  '$ CREATININE 3.50* 3.47* 3.94*  CALCIUM 7.9* 7.7* 7.7*  PHOS  --  2.5 2.7   Liver Function Tests: Recent Labs  Lab 10/15/2022 2240 10/09/22 0410  AST  --  27  ALT  --  11  ALKPHOS  --  63  BILITOT  --  1.2  PROT  --  7.7  ALBUMIN 3.4* 3.4*   No results for input(s): "LIPASE", "AMYLASE" in the last 168 hours. No results for input(s): "AMMONIA" in the last 168 hours. CBC: Recent Labs  Lab 10/10/2022 1747 10/12/2022 1804 10/17/2022 2240 10/09/22 0410  WBC 5.5  --  5.4 6.4  NEUTROABS 4.0  --   --   --   HGB 13.1 13.6 12.7* 12.1*  HCT 38.4* 40.0 37.7* 36.2*  MCV 97.5  --  97.4 97.8  PLT 201  --  129* 134*   Cardiac Enzymes: No results for input(s): "CKTOTAL", "CKMB", "  CKMBINDEX", "TROPONINI" in the last 168 hours. CBG: Recent Labs  Lab 10/02/22 1235 10/02/22 1406 10/02/22 1439 10/02/22 1600 10/02/22 1632  GLUCAP 72 65* 74 62* 88   Iron Studies: No results for input(s): "IRON", "TIBC", "TRANSFERRIN", "FERRITIN" in the last 72 hours. Studies/Results: DG Chest Port 1 View  Result Date: 09/23/2022 CLINICAL  DATA:  Triage notes:"Pt arrived via [facility] for shortness of breath from home. Pt was on couch in apparent distress, accessory muscle use, extended exhalation. Pt reports shortness of breath post dialysis. EMS report expiratory wheezing EXAM: PORTABLE CHEST - 1 VIEW COMPARISON:  09/11/2022 FINDINGS: Stable tunneled right IJ hemodialysis catheter to the cavoatrial junction. Persistent moderate right pleural effusion with atelectasis/consolidation at the right lung base. Left lung clear. Stable cardiomegaly. No pneumothorax. Orthopedic anchor in the right humeral head. IMPRESSION: Persistent moderate right pleural effusion with right basilar atelectasis/consolidation. Electronically Signed   By: Lucrezia Europe M.D.   On: 10/16/2022 17:45    Dialysis Orders:  [facility] - per last admission (will call to verify)  4hr 400/500 EDW 74.5 kg   2/2.5 Elisio Heparin 3K bolus, Hectorol 69mg  Assessment/Plan: Acute hypoxic respiratory failure/volume overload - Well over dry weight by weights here. UF only today to get volume down. Follow.  Bilateral pleural effusions - IR consult for thoracentesis.  ESRD -  HD TTS. Had full treatment Tuesday. Extra UF today for volume. Back on schedule tomorro  Hypertension/volume  - Bps lowish at start of treatment. UF as able  Anemia  - Hb above goal. No ESA needs currently  Metabolic bone disease -  Ca/Phos ok. Continue home meds.  Nutrition - Renal diet with fluid restriction  HFrEF 15-20%   OLynnda ChildPA-C [facility] 10/09/2022, 9:14 AM

## 2022-10-09 NOTE — ED Notes (Addendum)
Pt taken off bipap per admitting MD/patient request. Pt more comfortable, in no distress. RN still unable to obtain SPO2 reading on patient.

## 2022-10-09 NOTE — Progress Notes (Signed)
Received nursing report from Vladimir Creeks, RN for pt to come to hemodialysis. RN Darnelle Maffucci stated he was going to hang pt IV Magnesium, I advised and educated him to hold the magnesium because it will dialysis out in hemodialysis twice.  RN states he is going to give it to help with pt breathing.  I notified my AD/charge Arta Silence, RN

## 2022-10-09 NOTE — Procedures (Signed)
Patient seen and examined on Hemodialysis. The procedure was supervised and I have made appropriate changes. BP 112/75 (BP Location: Right Arm)   Pulse 95   Temp 98.7 F (37.1 C) (Oral)   Resp (!) 23   Ht '5\' 8"'$  (1.727 m)   Wt 84.6 kg   SpO2 95%   BMI 28.36 kg/m   QB 400 mL/ min via TDC, UF goal 4L  Tolerating treatment without complaints at this time.   Madelon Lips MD [facility] [phone] 10:04 AM

## 2022-10-09 NOTE — Progress Notes (Signed)
Pharmacy Antibiotic Note  [name]. is a 82 y.o. male admitted on 10/09/2022 with  pneumonia .  Pharmacy has been consulted for Vancomycin and Cefepime dosing. Pt with ESRD - usual T/T/S HD  Pt received extra HD session with max UF on 1/17. WBC 6.4, afebrile.  Plan: Continue Cefepime 1gm IV q24h Give Vancomycin '750mg'$  IV x1 today, post-UF Continue Vancomycin 750 mg IV qHD T/T/S     >> goal vancomycin trough levels 15-20     >> check vancomycin levels at steady state Will f/u HD schedule/tolerance, micro data, and pt's clinical condition   Height: '5\' 8"'$  (172.7 cm) Weight: 84.6 kg (186 lb 8.2 oz) IBW/kg (Calculated) : 68.4  Temp (24hrs), Avg:97.7 F (36.5 C), Min:96.2 F (35.7 C), Max:98.7 F (37.1 C)  Recent Labs  Lab 10/02/22 1350 10/07/2022 1744 09/26/2022 1747 10/04/2022 2128 09/25/2022 2240 10/09/22 0410  WBC  --   --  5.5  --  5.4 6.4  CREATININE 4.30* 3.40*  --  3.50* 3.47* 3.94*     Estimated Creatinine Clearance: 15.8 mL/min (A) (by C-G formula based on SCr of 3.94 mg/dL (H)).    No Known Allergies  Antimicrobials this admission: 1/16 Vanc >>  1/16 Cefepime >>   Microbiology results: 1/17 MRSA PCR pending  Thank you for allowing pharmacy to be a part of this patient's care.  Luisa Hart, PharmD, BCPS Clinical Pharmacist 10/09/2022 12:22 PM   Please refer to AMION for pharmacy phone number

## 2022-10-09 NOTE — Consult Note (Addendum)
WOC Nurse Consult Note: Reason for Consult: severe desquamation of the bilateral feet Wound type: no open wounds Pressure Injury POA: NA Measurement:NA Wound bed: no open wounds Drainage (amount, consistency, odor) none Periwound: intact  Dressing procedure/placement/frequency: Patient reported that he had areas of peeling and has been on a prescription medication from the [place] for an unknown amount of time but was just given at last appointment. He was using this "cream" on his feet.   I explained that we have limited views of the [place] records, I have searched for recent scripts but do not find anything that sounds like what he may have been using.   Patient has Eucerin ordered while inpatient and he can return to use prescriptive cream when he returns home.   Discussed POC with patient and bedside nurse.  Re consult if needed, will not follow at this time. Thanks  Traeger Sultana [employer], RN,CWOCN, CNS, [place] [phone])

## 2022-10-09 NOTE — ED Notes (Signed)
Pt comfortable on 4L [place]. Respiratory rate 20-25, only slightly more than regular work of breathing. Pt requested head of bed to be leaned back to semi fowlers from completely up right. Expiratory wheezing remains quietly audible, intermittent wet cough. No urine produced post lasix administration, but this is patient baseline. Pt A&Ox4, GCS 15.

## 2022-10-09 NOTE — ED Notes (Signed)
Pt in bed, pt has some increased work of breathing, pt has unproductive cough, repositioned pt in bed, pt has inspiratory wheeze, pt had decreased work of breathing, but still some accessory muscle use. Neb given

## 2022-10-09 NOTE — ED Notes (Signed)
Pt sitting up in bed eating dinner, pt states that he is feeling better, pt has decreased work of breathing and decreased accessory muscle use.   Talked with IR, explained that pt was to have a thoracentesis today; however, pt was in dialysis when they came by to get him and didn't come back, states that they are done for the day and plan to take pt for his thoracentesis tomorrow around 1pm after dialysis.

## 2022-10-09 NOTE — ED Notes (Signed)
Pt in bed, pt sitting on the end of the bed, pt states that he no longer needs to void, pt back into bed, pt states that he is more sob, offered neb treatment, pt accepted, treatment given, monitor leads replaced, pt denies pain, pt has no other requests.

## 2022-10-09 NOTE — ED Notes (Signed)
Pt back from dialysis, pt ate most of his meal tray, pt remains on O2 sat and heart monitor, pt denies pain,

## 2022-10-09 NOTE — ED Notes (Signed)
Pt in bed, pt states that he is breathing better, breathing treatment complete, pt states that he is ready to go to dialysis,

## 2022-10-09 NOTE — Progress Notes (Signed)
Received patient in bed to unit.  Alert and oriented.  Informed consent signed and in chart.   Treatment initiated: 08:27 Treatment completed: 11:57  Patient tolerated well.  Transported back to the room  Alert, without acute distress.  Hand-off given to patient's nurse.   Access used: right [facility] Access issues: none  Total UF removed: 2.9L Medication(s) given: none   10/09/22 1157  Vitals  Temp 98.4 F (36.9 C)  Temp Source Oral  BP 104/71  MAP (mmHg) 82  BP Location Right Arm  BP Method Automatic  Patient Position (if appropriate) Lying  Pulse Rate 87  Pulse Rate Source Monitor  ECG Heart Rate 87  Resp (!) 21  Oxygen Therapy  SpO2 96 %  O2 Device Nasal Cannula  O2 Flow Rate (L/min) 4 L/min  Patient Activity (if Appropriate) In bed  Pulse Oximetry Type Continuous  During Treatment Monitoring  Blood Flow Rate (mL/min) 400 mL/min  Arterial Pressure (mmHg) -170 mmHg  Venous Pressure (mmHg) 240 mmHg  TMP (mmHg) -24 mmHg  Ultrafiltration Rate (mL/min) 1024 mL/min  Dialysate Flow Rate (mL/min) 300 ml/min  HD Safety Checks Performed Yes  Intra-Hemodialysis Comments Tx completed  Post Treatment  Dialyzer Clearance Lightly streaked  Duration of HD Treatment -hour(s) 3.5 hour(s)  Liters Processed 84  Fluid Removed (mL) 2900 mL  Tolerated HD Treatment Yes      Shishir Krantz S [facility]

## 2022-10-09 NOTE — Progress Notes (Signed)
PT Cancellation Note  Patient Details Name: [name]. MRN: [id number] DOB: 1941-07-10   Cancelled Treatment:    Reason Eval/Treat Not Completed: Other (comment).  Pt was in HD and then eating a meal.  Retry as time and pt allow.   Ramond Dial 10/09/2022, 1:08 PM  Mee Hives, PT PhD Acute Rehab Dept. Number: [place]

## 2022-10-10 ENCOUNTER — Encounter (HOSPITAL_COMMUNITY): Payer: Self-pay | Admitting: Internal Medicine

## 2022-10-10 ENCOUNTER — Inpatient Hospital Stay (HOSPITAL_COMMUNITY): Payer: No Typology Code available for payment source

## 2022-10-10 ENCOUNTER — Other Ambulatory Visit: Payer: Self-pay

## 2022-10-10 DIAGNOSIS — R0603 Acute respiratory distress: Secondary | ICD-10-CM | POA: Diagnosis not present

## 2022-10-10 DIAGNOSIS — J189 Pneumonia, unspecified organism: Secondary | ICD-10-CM | POA: Diagnosis not present

## 2022-10-10 DIAGNOSIS — J90 Pleural effusion, not elsewhere classified: Secondary | ICD-10-CM | POA: Diagnosis not present

## 2022-10-10 DIAGNOSIS — N186 End stage renal disease: Secondary | ICD-10-CM | POA: Diagnosis not present

## 2022-10-10 LAB — RENAL FUNCTION PANEL
Albumin: 3.4 g/dL — ABNORMAL LOW (ref 3.5–5.0)
Anion gap: 15 (ref 5–15)
BUN: 35 mg/dL — ABNORMAL HIGH (ref 8–23)
CO2: 23 mmol/L (ref 22–32)
Calcium: 7.7 mg/dL — ABNORMAL LOW (ref 8.9–10.3)
Chloride: 90 mmol/L — ABNORMAL LOW (ref 98–111)
Creatinine, Ser: 5.23 mg/dL — ABNORMAL HIGH (ref 0.61–1.24)
GFR, Estimated: 10 mL/min — ABNORMAL LOW (ref >60)
Glucose, Bld: 221 mg/dL — ABNORMAL HIGH (ref 70–99)
Phosphorus: 4.1 mg/dL (ref 2.5–4.6)
Potassium: 4.2 mmol/L (ref 3.5–5.1)
Sodium: 128 mmol/L — ABNORMAL LOW (ref 135–145)

## 2022-10-10 LAB — CBC
HCT: 34.9 % — ABNORMAL LOW (ref 39.0–52.0)
Hemoglobin: 11.4 g/dL — ABNORMAL LOW (ref 13.0–17.0)
MCH: 32.4 pg (ref 26.0–34.0)
MCHC: 32.7 g/dL (ref 30.0–36.0)
MCV: 99.1 fL (ref 80.0–100.0)
Platelets: 134 10*3/uL — ABNORMAL LOW (ref 150–400)
RBC: 3.52 MIL/uL — ABNORMAL LOW (ref 4.22–5.81)
RDW: 15.1 % (ref 11.5–15.5)
WBC: 5.9 10*3/uL (ref 4.0–10.5)
nRBC: 0 % (ref 0.0–0.2)

## 2022-10-10 LAB — MAGNESIUM: Magnesium: 2.2 mg/dL (ref 1.7–2.4)

## 2022-10-10 LAB — HEPATITIS B SURFACE ANTIBODY, QUANTITATIVE: Hep B S AB Quant (Post): 82.7 m[IU]/mL (ref >9.9)

## 2022-10-10 MED ORDER — SODIUM CHLORIDE 0.9 % IV SOLN
500.0000 mg | INTRAVENOUS | Status: DC
  Administered 2022-10-10 – 2022-10-11 (×2): 500 mg via INTRAVENOUS
  Filled 2022-10-10: qty 5

## 2022-10-10 MED ORDER — HEPARIN SODIUM (PORCINE) 1000 UNIT/ML IJ SOLN
INTRAMUSCULAR | Status: AC
  Filled 2022-10-10: qty 5

## 2022-10-10 MED ORDER — SODIUM CHLORIDE 0.9 % IV SOLN
500.0000 mg | INTRAVENOUS | Status: DC
  Filled 2022-10-10: qty 5

## 2022-10-10 MED ORDER — SODIUM CHLORIDE 0.9 % IV SOLN
2.0000 g | INTRAVENOUS | Status: AC
  Administered 2022-10-10 – 2022-10-13 (×4): 2 g via INTRAVENOUS
  Filled 2022-10-10 (×4): qty 20

## 2022-10-10 NOTE — Progress Notes (Addendum)
PROGRESS NOTE        PATIENT DETAILS Name: [name]. Age: 82 y.o. Sex: male Date of Birth: 12-23-1940 Admit Date: 10/05/2022 Admitting Physician Kayleen Memos, DO BVQ:[id number], [place], MD  Brief Summary: Patient is a 82 y.o.  male with history of ESRD on HD TTS, HFrEF, COPD, HTN, HLD who presented with SOB x 1 day.  Patient was found to have acute hypoxic respiratory failure-requiring BiPAP on initial presentation.  Significant events: 1/16>> SOB-hypoxia-requiring BiPAP.  Last HD on 1/16 as an outpatient.  Significant studies: 1/16>> CXR: Moderate right pleural effusion-right basilar consolidation.  Significant microbiology data: 1/16>> COVID/influenza/RSV PCR: Negative  Procedures: None  Consults: Nephrology  Subjective: Still wheezing-continues to cough.  Feels somewhat better.  Was on 4 L of oxygen overnight-this was titrated down to 2.5 L.  Objective: Vitals: Blood pressure 108/77, pulse 92, temperature (!) 97.1 F (36.2 C), temperature source Axillary, resp. rate 17, height '5\' 6"'$  (1.676 m), weight 72 kg, SpO2 96 %.   Exam: Gen Exam:Alert awake-not in any distress HEENT:atraumatic, normocephalic Chest: Coarse rhonchi all over CVS:S1S2 regular Abdomen:soft non tender, non distended Extremities:no edema Neurology: Non focal Skin: no rash  Pertinent Labs/Radiology:    Latest Ref Rng & Units 10/09/2022    4:10 AM 10/01/2022   10:40 PM 10/22/2022    6:04 PM  CBC  WBC 4.0 - 10.5 K/uL 6.4  5.4    Hemoglobin 13.0 - 17.0 g/dL 12.1  12.7  13.6   Hematocrit 39.0 - 52.0 % 36.2  37.7  40.0   Platelets 150 - 400 K/uL 134  129      Lab Results  Component Value Date   NA 129 (L) 10/09/2022   K 4.0 10/09/2022   CL 89 (L) 10/09/2022   CO2 27 10/09/2022      Assessment/Plan: Acute hypoxic respiratory failure-multifactorial etiology-right lobar pneumonia/pleural effusion/COPD exacerbation and some amount of volume overload in the  setting of ESRD  Overall-titrated down to 2.5 L today Plan is to continue to treat underlying etiologies-remains on antibiotics (switch to Rocephin/azithromycin)/steroids/bronchodilators-s/p HD yesterday-will be dialyzed again today to get him back on his usual schedule.  Awaiting thoracocentesis-given PNA-reasonable to rule out a parapneumonic effusion-although this is likely to be a transudate.    Acute on chronic HFrEF Volume overload Although no obvious volume overload-Per nephrology-he is beyond his usual dry weight Overall better-s/p HD on 1/17, will be dialyzed again on 1/18 at this usual schedule.  Resume losartan/beta-blocker over the next few days if BP stable.  ESRD on HD TTS Nephrology now following.  Hyponatremia Mild-asymptomatic Hopefully should have improved with dialysis Morning labs not done today-we will repeat tomorrow.  Hypomagnesemia Replete/recheck not done-repeat tomorrow morning.  HTN BP currently soft in the low 828M systolic Metoprolol/losartan on hold-resume over the next few days  HLD Resume statin  Desquamation of skin of bilateral lower extremities Trial of eucerin ointment  BMI: Estimated body mass index is 25.62 kg/m as calculated from the following:   Height as of this encounter: '5\' 6"'$  (1.676 m).   Weight as of this encounter: 72 kg.   Code status:   Code Status: Full Code   DVT Prophylaxis: heparin injection 5,000 Units Start: 10/07/2022 2200   Family Communication: None at bedside   Disposition Plan: Status is: Inpatient Remains inpatient appropriate because: Severity of  illness.   Planned Discharge Destination:Home   Diet: Diet Order             Diet renal with fluid restriction Fluid restriction: 1200 mL Fluid; Room service appropriate? Yes; Fluid consistency: Thin  Diet effective now                     Antimicrobial agents: Anti-infectives (From admission, onward)    Start     Dose/Rate Route Frequency  Ordered Stop   10/10/22 1200  vancomycin (VANCOREADY) IVPB 750 mg/150 mL  Status:  Discontinued        750 mg 150 mL/hr over 60 Minutes Intravenous Every T-Th-Sa (Hemodialysis) 09/24/2022 2036 10/10/22 0650   10/10/22 0800  cefTRIAXone (ROCEPHIN) 2 g in sodium chloride 0.9 % 100 mL IVPB        2 g 200 mL/hr over 30 Minutes Intravenous Every 24 hours 10/10/22 0650 10/14/22 0759   10/10/22 0800  azithromycin (ZITHROMAX) 500 mg in sodium chloride 0.9 % 250 mL IVPB        500 mg 250 mL/hr over 60 Minutes Intravenous Every 24 hours 10/10/22 0650 10/13/22 0759   10/09/22 1300  vancomycin (VANCOREADY) IVPB 750 mg/150 mL        750 mg 150 mL/hr over 60 Minutes Intravenous  Once 10/09/22 1219 10/09/22 1434   10/18/2022 2045  ceFEPIme (MAXIPIME) 1 g in sodium chloride 0.9 % 100 mL IVPB  Status:  Discontinued        1 g 200 mL/hr over 30 Minutes Intravenous Daily at bedtime 10/18/2022 2036 10/10/22 0650   10/20/2022 2045  vancomycin (VANCOREADY) IVPB 1500 mg/300 mL        1,500 mg 150 mL/hr over 120 Minutes Intravenous NOW 10/18/2022 2036 10/09/22 0058        MEDICATIONS: Scheduled Meds:  atorvastatin  40 mg Oral QHS   Chlorhexidine Gluconate Cloth  6 each Topical Q0600   heparin  5,000 Units Subcutaneous Q8H   hydrocerin   Topical TID   ipratropium-albuterol  3 mL Nebulization Q6H   methylPREDNISolone (SOLU-MEDROL) injection  40 mg Intravenous Q12H   Continuous Infusions:  albumin human     anticoagulant sodium citrate     azithromycin     cefTRIAXone (ROCEPHIN)  IV 2 g (10/10/22 0831)   PRN Meds:.albuterol, alteplase, anticoagulant sodium citrate, heparin, heparin, lidocaine (PF), lidocaine-prilocaine, pentafluoroprop-tetrafluoroeth, white petrolatum   I have personally reviewed following labs and imaging studies  LABORATORY DATA: CBC: Recent Labs  Lab 10/13/2022 1744 10/09/2022 1747 10/06/2022 1804 09/24/2022 2240 10/09/22 0410  WBC  --  5.5  --  5.4 6.4  NEUTROABS  --  4.0  --   --   --    HGB 14.6 13.1 13.6 12.7* 12.1*  HCT 43.0 38.4* 40.0 37.7* 36.2*  MCV  --  97.5  --  97.4 97.8  PLT  --  201  --  129* 134*     Basic Metabolic Panel: Recent Labs  Lab 09/23/2022 1744 10/23/2022 1804 10/09/2022 2128 10/07/2022 2240 10/09/22 0410  NA 128* 129* 130* 131* 129*  K 5.5* 3.6 4.0 4.1 4.0  CL 92*  --  88* 92* 89*  CO2  --   --  '28 25 27  '$ GLUCOSE 88  --  82 83 141*  BUN 20  --  '15 16 19  '$ CREATININE 3.40*  --  3.50* 3.47* 3.94*  CALCIUM  --   --  7.9* 7.7* 7.7*  MG  --   --   --   --  1.6*  PHOS  --   --   --  2.5 2.7     GFR: Estimated Creatinine Clearance: 13.5 mL/min (A) (by C-G formula based on SCr of 3.94 mg/dL (H)).  Liver Function Tests: Recent Labs  Lab 10/03/2022 2240 10/09/22 0410  AST  --  27  ALT  --  11  ALKPHOS  --  63  BILITOT  --  1.2  PROT  --  7.7  ALBUMIN 3.4* 3.4*    No results for input(s): "LIPASE", "AMYLASE" in the last 168 hours. No results for input(s): "AMMONIA" in the last 168 hours.  Coagulation Profile: No results for input(s): "INR", "PROTIME" in the last 168 hours.  Cardiac Enzymes: No results for input(s): "CKTOTAL", "CKMB", "CKMBINDEX", "TROPONINI" in the last 168 hours.  BNP (last 3 results) No results for input(s): "PROBNP" in the last 8760 hours.  Lipid Profile: No results for input(s): "CHOL", "HDL", "LDLCALC", "TRIG", "CHOLHDL", "LDLDIRECT" in the last 72 hours.  Thyroid Function Tests: No results for input(s): "TSH", "T4TOTAL", "FREET4", "T3FREE", "THYROIDAB" in the last 72 hours.  Anemia Panel: No results for input(s): "VITAMINB12", "FOLATE", "FERRITIN", "TIBC", "IRON", "RETICCTPCT" in the last 72 hours.  Urine analysis:    Component Value Date/Time   COLORURINE YELLOW 03/15/2019 [place] 03/15/2019 1644   LABSPEC 1.013 03/15/2019 1644   PHURINE 5.0 03/15/2019 1644   GLUCOSEU 50 (A) 03/15/2019 1644   HGBUR SMALL (A) 03/15/2019 1644   BILIRUBINUR NEGATIVE 03/15/2019 1644   KETONESUR  NEGATIVE 03/15/2019 1644   PROTEINUR >=300 (A) 03/15/2019 1644   NITRITE NEGATIVE 03/15/2019 1644   LEUKOCYTESUR NEGATIVE 03/15/2019 1644    Sepsis Labs: Lactic Acid, Venous    Component Value Date/Time   LATICACIDVEN 1.6 06/25/2021 1707    MICROBIOLOGY: Recent Results (from the past 240 hour(s))  Resp panel by RT-PCR (RSV, Flu A&B, Covid) Anterior Nasal Swab     Status: None   Collection Time: 10/01/2022  6:46 PM   Specimen: Anterior Nasal Swab  Result Value Ref Range Status   SARS Coronavirus 2 by RT PCR NEGATIVE NEGATIVE Final    Comment: (NOTE) SARS-CoV-2 target nucleic acids are NOT DETECTED.  The SARS-CoV-2 RNA is generally detectable in upper respiratory specimens during the acute phase of infection. The lowest concentration of SARS-CoV-2 viral copies this assay can detect is 138 copies/mL. A negative result does not preclude SARS-Cov-2 infection and should not be used as the sole basis for treatment or other patient management decisions. A negative result may occur with  improper specimen collection/handling, submission of specimen other than nasopharyngeal swab, presence of viral mutation(s) within the areas targeted by this assay, and inadequate number of viral copies(<138 copies/mL). A negative result must be combined with clinical observations, patient history, and epidemiological information. The expected result is Negative.  Fact Sheet for Patients:  EntrepreneurPulse.com.au  Fact Sheet for Healthcare Providers:  IncredibleEmployment.be  This test is no t yet approved or cleared by the [place] FDA and  has been authorized for detection and/or diagnosis of SARS-CoV-2 by FDA under an Emergency Use Authorization (EUA). This EUA will remain  in effect (meaning this test can be used) for the duration of the COVID-19 declaration under Section 564(b)(1) of the Act, 21 U.S.C.section 360bbb-3(b)(1), unless the authorization is  terminated  or revoked sooner.       Influenza A by PCR NEGATIVE NEGATIVE Final   Influenza B by PCR NEGATIVE NEGATIVE Final    Comment: (NOTE) The  Xpert Xpress SARS-CoV-2/FLU/RSV plus assay is intended as an aid in the diagnosis of influenza from Nasopharyngeal swab specimens and should not be used as a sole basis for treatment. Nasal washings and aspirates are unacceptable for Xpert Xpress SARS-CoV-2/FLU/RSV testing.  Fact Sheet for Patients: EntrepreneurPulse.com.au  Fact Sheet for Healthcare Providers: IncredibleEmployment.be  This test is not yet approved or cleared by the [place] FDA and has been authorized for detection and/or diagnosis of SARS-CoV-2 by FDA under an Emergency Use Authorization (EUA). This EUA will remain in effect (meaning this test can be used) for the duration of the COVID-19 declaration under Section 564(b)(1) of the Act, 21 U.S.C. section 360bbb-3(b)(1), unless the authorization is terminated or revoked.     Resp Syncytial Virus by PCR NEGATIVE NEGATIVE Final    Comment: (NOTE) Fact Sheet for Patients: EntrepreneurPulse.com.au  Fact Sheet for Healthcare Providers: IncredibleEmployment.be  This test is not yet approved or cleared by the [place] FDA and has been authorized for detection and/or diagnosis of SARS-CoV-2 by FDA under an Emergency Use Authorization (EUA). This EUA will remain in effect (meaning this test can be used) for the duration of the COVID-19 declaration under Section 564(b)(1) of the Act, 21 U.S.C. section 360bbb-3(b)(1), unless the authorization is terminated or revoked.  Performed at [facility], [place] [address], [place], [place] [address]   MRSA Next Gen by PCR, Nasal     Status: None   Collection Time: 10/09/22  1:36 PM   Specimen: Nasal Mucosa; Nasal Swab  Result Value Ref Range Status   MRSA by PCR Next Gen NOT DETECTED NOT  DETECTED Final    Comment: (NOTE) The GeneXpert MRSA Assay (FDA approved for NASAL specimens only), is one component of a comprehensive MRSA colonization surveillance program. It is not intended to diagnose MRSA infection nor to guide or monitor treatment for MRSA infections. Test performance is not FDA approved in patients less than 93 years old. Performed at [facility], [place] [address], [place], [place] [address]     RADIOLOGY STUDIES/RESULTS: DG Chest Port 1 View  Result Date: 10/10/2022 CLINICAL DATA:  Shortness of breath EXAM: PORTABLE CHEST 1 VIEW COMPARISON:  Chest x-ray dated October 08, 2022 FINDINGS: Unchanged cardiomegaly. Stable position of right central venous catheter. Stable moderate right pleural effusion associated atelectasis. Evidence of pneumothorax. IMPRESSION: 1. Stable moderate right pleural effusion associated atelectasis. 2. Unchanged cardiomegaly. Electronically Signed   By: Yetta Glassman M.D.   On: 10/10/2022 08:25   DG Chest Port 1 View  Result Date: 10/15/2022 CLINICAL DATA:  Triage notes:"Pt arrived via [facility] for shortness of breath from home. Pt was on couch in apparent distress, accessory muscle use, extended exhalation. Pt reports shortness of breath post dialysis. EMS report expiratory wheezing EXAM: PORTABLE CHEST - 1 VIEW COMPARISON:  09/11/2022 FINDINGS: Stable tunneled right IJ hemodialysis catheter to the cavoatrial junction. Persistent moderate right pleural effusion with atelectasis/consolidation at the right lung base. Left lung clear. Stable cardiomegaly. No pneumothorax. Orthopedic anchor in the right humeral head. IMPRESSION: Persistent moderate right pleural effusion with right basilar atelectasis/consolidation. Electronically Signed   By: Lucrezia Europe M.D.   On: 10/07/2022 17:45     LOS: 2 days   Oren Binet, MD  [facility]    To contact the attending provider between 7A-7P or the covering provider during after hours  7P-7A, please log into the web site [email or web address] and access using universal [place] password for that web site. If  you do not have the password, please call the hospital operator.  10/10/2022, 9:20 AM

## 2022-10-10 NOTE — Progress Notes (Signed)
[facility] Progress Note   Assessment/ Plan:   Dialysis Orders:  [facility] - per last admission (will call to verify)  4hr 400/500 EDW 74.5 kg   2/2.5 Elisio Heparin 3K bolus, Hectorol 56mg   Assessment/Plan: Acute hypoxic respiratory failure/volume overload - Well over dry weight by weights here.Got good UF yesterday, HD today. Bilateral pleural effusions - IR consult for thoracentesis.  ESRD -  HD TTS. Had full treatment Tuesday. Extra UF 1/17, back on schedule today 1/18.   Hypertension/volume  - Bps lowish at start of treatment. UF as able  Anemia  - Hb above goal. No ESA needs currently  Metabolic bone disease -  Ca/Phos ok. Continue home meds.  Nutrition - Renal diet with fluid restriction HFrEF: EF 15-20%  Subjective:    Seen in HD unit- about to start dialysis.  Breathing better but still requiring O2.     Objective:   BP 102/82 (BP Location: Right Arm)   Pulse 90   Temp 97.7 F (36.5 C) (Oral)   Resp 18   Ht '5\' 6"'$  (1.676 m)   Wt 73.5 kg   SpO2 (!) 82%   BMI 26.15 kg/m   Physical Exam: GPJK:DTOIZTINC O2, appears more comfortable CVS: RRR Resp: still has some crackles Abd: soft Ext: 1+ LE edema, improvied [facility] Labs: BMET Recent Labs  Lab 10/03/2022 1744 10/15/2022 1804 10/03/2022 2128 10/02/2022 2240 10/09/22 0410  NA 128* 129* 130* 131* 129*  K 5.5* 3.6 4.0 4.1 4.0  CL 92*  --  88* 92* 89*  CO2  --   --  '28 25 27  '$ GLUCOSE 88  --  82 83 141*  BUN 20  --  '15 16 19  '$ CREATININE 3.40*  --  3.50* 3.47* 3.94*  CALCIUM  --   --  7.9* 7.7* 7.7*  PHOS  --   --   --  2.5 2.7   CBC Recent Labs  Lab 10/16/2022 1747 09/27/2022 1804 10/23/2022 2240 10/09/22 0410  WBC 5.5  --  5.4 6.4  NEUTROABS 4.0  --   --   --   HGB 13.1 13.6 12.7* 12.1*  HCT 38.4* 40.0 37.7* 36.2*  MCV 97.5  --  97.4 97.8  PLT 201  --  129* 134*      Medications:     atorvastatin  40 mg Oral QHS   Chlorhexidine Gluconate Cloth  6 each Topical  Q0600   heparin  5,000 Units Subcutaneous Q8H   hydrocerin   Topical TID   ipratropium-albuterol  3 mL Nebulization Q6H   methylPREDNISolone (SOLU-MEDROL) injection  40 mg Intravenous Q12H     EMadelon LipsMD 10/10/2022, 9:25 AM

## 2022-10-10 NOTE — Progress Notes (Signed)
OT Cancellation Note  Patient Details Name: [name]. MRN: [id number] DOB: 1940-12-01   Cancelled Treatment:    Reason Eval/Treat Not Completed: Patient at procedure or test/ unavailable (Pt is currently off floor at HD. Will check back as able for OT assessment as time and pt allows.)  Almyra Deforest, OTR/L 10/10/2022, 10:17 AM

## 2022-10-10 NOTE — Evaluation (Addendum)
Physical Therapy Evaluation Patient Details Name: [name]. MRN: [id number] DOB: 1941-04-19 Today's Date: 10/10/2022  History of Present Illness  Pt is an 82 y.o. male admitted 1/16 for acute respiratory distress, pulmonary edema. 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 PTA pt lived at home with spouse. PCA 3 x week. Pt amb in first floor apt with RW. Still driving and using electric scooter in community. Pt currently with functional limitations due to the deficits listed below (see PT Problem List). On eval, pt required supervision bed mobility and min assist sit to stand. Unable to progress gait due to bilat foot pain, which pt rates as 8/10. SpO2 maintained in 90s on 2L. Increased WOB and 2/4 DOE noted with minimal activity. Pt is scheduled for HD and thoracentesis today. Pt will benefit from skilled PT to increase their independence and safety with mobility to allow discharge to the venue listed below.          Recommendations for follow up therapy are one component of a multi-disciplinary discharge planning process, led by the attending physician.  Recommendations may be updated based on patient status, additional functional criteria and insurance authorization.  Follow Up Recommendations Skilled nursing-short term rehab (<3 hours/day) Can patient physically be transported by private vehicle: Yes    Assistance Recommended at Discharge Frequent or constant Supervision/Assistance  Patient can return home with the following  A little help with walking and/or transfers;A little help with bathing/dressing/bathroom;Assist for transportation    Equipment Recommendations None recommended by PT  Recommendations for Other Services       Functional Status Assessment Patient has had a recent decline in their functional status and demonstrates  the ability to make significant improvements in function in a reasonable and predictable amount of time.     Precautions / Restrictions Precautions Precautions: Other (comment);Fall Precaution Comments: watch sats      Mobility  Bed Mobility Overal bed mobility: Needs Assistance Bed Mobility: Supine to Sit, Sit to Supine     Supine to sit: Supervision, HOB elevated Sit to supine: Supervision, HOB elevated   General bed mobility comments: +rail, increased time    Transfers Overall transfer level: Needs assistance Equipment used: Rolling walker (2 wheels) Transfers: Sit to/from Stand Sit to Stand: Min assist           General transfer comment: assist to power up    Ambulation/Gait               General Gait Details: Unable to tolerate gait or marching in place due to bilat foot pain  Stairs            Wheelchair Mobility    Modified Rankin (Stroke Patients Only)       Balance Overall balance assessment: Needs assistance Sitting-balance support: No upper extremity supported, Feet supported       Standing balance support: Bilateral upper extremity supported, Reliant on assistive device for balance, During functional activity Standing balance-Leahy Scale: Poor                               Pertinent Vitals/Pain Pain Assessment Pain Assessment: 0-10 Pain Score: 8  Pain Location: bilat feet Pain Descriptors / Indicators: Tender, Grimacing, Guarding, Discomfort Pain Intervention(s): Limited activity within patient's tolerance, Repositioned    Home Living Family/patient  expects to be discharged to:: Private residence Living Arrangements: Spouse/significant other Available Help at Discharge: Family;Personal care attendant (PCA 2 hours, 3 x week through the [place]) Type of Home: Apartment Home Access: Level entry       Home Layout: One level Home Equipment: [place] - single Barista (2 wheels);Electric scooter Additional  Comments: no home O2 at baseline    Prior Function Prior Level of Function : Needs assist;Driving       Physical Assist : ADLs (physical)   ADLs (physical): Bathing;IADLs Mobility Comments: RW in apartment mod I, scooter in community ADLs Comments: PCA assists with bathing and cooking     Hand Dominance        Extremity/Trunk Assessment   Upper Extremity Assessment Upper Extremity Assessment: Defer to OT evaluation    Lower Extremity Assessment Lower Extremity Assessment: Generalized weakness    Cervical / Trunk Assessment Cervical / Trunk Assessment: Kyphotic  Communication   Communication: No difficulties  Cognition Arousal/Alertness: Awake/alert Behavior During Therapy: WFL for tasks assessed/performed Overall Cognitive Status: Within Functional Limits for tasks assessed                                          General Comments General comments (skin integrity, edema, etc.): SpO2 in 90s on 2L. Increased WOB. 2/4 DOE with minimal activity. Audible expiratory wheezing    Exercises     Assessment/Plan    PT Assessment Patient needs continued PT services  PT Problem List Decreased strength;Decreased balance;Pain;Decreased mobility;Cardiopulmonary status limiting activity;Decreased activity tolerance       PT Treatment Interventions Functional mobility training;Balance training;Patient/family education;Gait training;Therapeutic activities;Therapeutic exercise    PT Goals (Current goals can be found in the Care Plan section)  Acute Rehab PT Goals Patient Stated Goal: decrease foot pain, breathe better PT Goal Formulation: With patient Time For Goal Achievement: 10/24/22 Potential to Achieve Goals: Good    Frequency Min 2X/week     Co-evaluation               AM-PAC PT "6 Clicks" Mobility  Outcome Measure Help needed turning from your back to your side while in a flat bed without using bedrails?: None Help needed moving from lying  on your back to sitting on the side of a flat bed without using bedrails?: A Little Help needed moving to and from a bed to a chair (including a wheelchair)?: A Little Help needed standing up from a chair using your arms (e.g., wheelchair or bedside chair)?: A Little Help needed to walk in hospital room?: A Lot Help needed climbing 3-5 steps with a railing? : Total 6 Click Score: 16    End of Session Equipment Utilized During Treatment: Gait belt Activity Tolerance: Patient limited by pain Patient left: in bed;with call bell/phone within reach;with bed alarm set Nurse Communication: Mobility status PT Visit Diagnosis: Pain;Difficulty in walking, not elsewhere classified (R26.2) Pain - part of body: Ankle and joints of foot    Time: 7412-8786 PT Time Calculation (min) (ACUTE ONLY): 15 min   Charges:   PT Evaluation $PT Eval Moderate Complexity: 1 Mod          Lorrin Goodell, PT  Office # [phone] Pager [phone]   Lorriane Shire 10/10/2022, 8:51 AM

## 2022-10-10 NOTE — Progress Notes (Signed)
Pt receives out-pt HD at the [facility] on TTS. Pt has a 7:30 am chair time. Will assist as needed.   Melven Sartorius Renal Navigator [phone]

## 2022-10-10 NOTE — ED Notes (Signed)
Taken up to floor at this time with RN and on a monitor. In NAD at time of arrival and during transfer.

## 2022-10-11 DIAGNOSIS — R0603 Acute respiratory distress: Secondary | ICD-10-CM | POA: Diagnosis not present

## 2022-10-11 DIAGNOSIS — N186 End stage renal disease: Secondary | ICD-10-CM | POA: Diagnosis not present

## 2022-10-11 DIAGNOSIS — J90 Pleural effusion, not elsewhere classified: Secondary | ICD-10-CM | POA: Diagnosis not present

## 2022-10-11 DIAGNOSIS — J189 Pneumonia, unspecified organism: Secondary | ICD-10-CM | POA: Diagnosis not present

## 2022-10-11 LAB — RENAL FUNCTION PANEL
Albumin: 3.4 g/dL — ABNORMAL LOW (ref 3.5–5.0)
Anion gap: 15 (ref 5–15)
BUN: 29 mg/dL — ABNORMAL HIGH (ref 8–23)
CO2: 25 mmol/L (ref 22–32)
Calcium: 8.1 mg/dL — ABNORMAL LOW (ref 8.9–10.3)
Chloride: 91 mmol/L — ABNORMAL LOW (ref 98–111)
Creatinine, Ser: 3.64 mg/dL — ABNORMAL HIGH (ref 0.61–1.24)
GFR, Estimated: 16 mL/min — ABNORMAL LOW (ref >60)
Glucose, Bld: 145 mg/dL — ABNORMAL HIGH (ref 70–99)
Phosphorus: 3.4 mg/dL (ref 2.5–4.6)
Potassium: 4.4 mmol/L (ref 3.5–5.1)
Sodium: 131 mmol/L — ABNORMAL LOW (ref 135–145)

## 2022-10-11 LAB — CBC
HCT: 36.8 % — ABNORMAL LOW (ref 39.0–52.0)
Hemoglobin: 12.2 g/dL — ABNORMAL LOW (ref 13.0–17.0)
MCH: 32.4 pg (ref 26.0–34.0)
MCHC: 33.2 g/dL (ref 30.0–36.0)
MCV: 97.9 fL (ref 80.0–100.0)
Platelets: 147 10*3/uL — ABNORMAL LOW (ref 150–400)
RBC: 3.76 MIL/uL — ABNORMAL LOW (ref 4.22–5.81)
RDW: 15.1 % (ref 11.5–15.5)
WBC: 9.1 10*3/uL (ref 4.0–10.5)
nRBC: 0 % (ref 0.0–0.2)

## 2022-10-11 LAB — MAGNESIUM: Magnesium: 1.8 mg/dL (ref 1.7–2.4)

## 2022-10-11 MED ORDER — PREDNISONE 20 MG PO TABS
40.0000 mg | ORAL_TABLET | Freq: Every day | ORAL | Status: AC
  Administered 2022-10-12 – 2022-10-15 (×4): 40 mg via ORAL
  Filled 2022-10-11 (×4): qty 2

## 2022-10-11 NOTE — Progress Notes (Addendum)
PROGRESS NOTE        PATIENT DETAILS Name: [name]. Age: 82 y.o. Sex: male Date of Birth: [birth year] Admit Date: 10/11/2022 Admitting Physician Kayleen Memos, DO QMV:HQIONGE, [place], MD  Brief Summary: Patient is a 82 y.o.  male with history of ESRD on HD TTS, HFrEF, COPD, HTN, HLD who presented with SOB x 1 day.  Patient was found to have acute hypoxic respiratory failure-requiring BiPAP on initial presentation.  Significant events: 1/16>> SOB-hypoxia-requiring BiPAP.  Last HD on 1/16 as an outpatient.  Significant studies: 1/16>> CXR: Moderate right pleural effusion-right basilar consolidation.  Significant microbiology data: 1/16>> COVID/influenza/RSV PCR: Negative  Procedures: None  Consults: Nephrology  Subjective: Feels much better-less wheezing-titrated down to 2 L of oxygen.  Objective: Vitals: Blood pressure 94/78, pulse 98, temperature 98.1 F (36.7 C), temperature source Axillary, resp. rate 19, height '5\' 6"'$  (1.676 m), weight 72.6 kg, SpO2 99 %.   Exam: Gen Exam:Alert awake-not in any distress HEENT:atraumatic, normocephalic Chest: Moving air bilaterally-only a few rhonchi heard today.  Significantly better than yesterday. CVS:S1S2 regular Abdomen:soft non tender, non distended Extremities:no edema Neurology: Non focal Skin: no rash  Pertinent Labs/Radiology:    Latest Ref Rng & Units 10/11/2022    1:46 AM 10/10/2022    9:21 AM 10/09/2022    4:10 AM  CBC  WBC 4.0 - 10.5 K/uL 9.1  5.9  6.4   Hemoglobin 13.0 - 17.0 g/dL 12.2  11.4  12.1   Hematocrit 39.0 - 52.0 % 36.8  34.9  36.2   Platelets 150 - 400 K/uL 147  134  134     Lab Results  Component Value Date   NA 131 (L) 10/11/2022   K 4.4 10/11/2022   CL 91 (L) 10/11/2022   CO2 25 10/11/2022      Assessment/Plan: Acute hypoxic respiratory failure-multifactorial etiology-right lobar pneumonia/pleural effusion/COPD exacerbation and some amount of volume  overload in the setting of ESRD  Much better after treatment of underlying etiologies Will plan for total of 5 days of empiric Rocephin/Zithromax Start steroid taper-remains on bronchodilators Hemodialysis per nephrology Reviewed chart-prior notes-appears to have a chronic right pleural effusion-that has been tapped in the past.  Last thoracocentesis November 2023-was a transudate.   Given overall improvement-he is less likely to have a parapneumonic effusion-will discontinue planned thoracocentesis and monitor clinically.  Acute on chronic HFrEF Volume overload Although no obvious volume overload-Per nephrology-he was beyond his dry weight on initial presentation Hemodialysis being managed by nephrology service.  ESRD on HD TTS Nephrology now following.  Hyponatremia Mild-asymptomatic Hopefully should have improved with dialysis Morning labs not done today-we will repeat tomorrow.  Hypomagnesemia Repleted  HTN BP currently soft in the low 952W systolic Metoprolol/losartan on hold-resume over the next few days  HLD statin  Desquamation of skin of bilateral lower extremities eucerin ointment  BMI: Estimated body mass index is 25.83 kg/m as calculated from the following:   Height as of this encounter: '5\' 6"'$  (1.676 m).   Weight as of this encounter: 72.6 kg.   Code status:   Code Status: Full Code   DVT Prophylaxis: heparin injection 5,000 Units Start: 09/23/2022 2200   Family Communication: None at bedside   Disposition Plan: Status is: Inpatient Remains inpatient appropriate because: Severity of illness.   Planned Discharge Destination:SNF   Diet: Diet Order  Diet renal with fluid restriction Fluid restriction: 1200 mL Fluid; Room service appropriate? Yes; Fluid consistency: Thin  Diet effective now                     Antimicrobial agents: Anti-infectives (From admission, onward)    Start     Dose/Rate Route Frequency Ordered Stop    10/10/22 1700  azithromycin (ZITHROMAX) 500 mg in sodium chloride 0.9 % 250 mL IVPB        500 mg 250 mL/hr over 60 Minutes Intravenous Every 24 hours 10/10/22 1429 10/13/22 1659   10/10/22 1200  vancomycin (VANCOREADY) IVPB 750 mg/150 mL  Status:  Discontinued        750 mg 150 mL/hr over 60 Minutes Intravenous Every T-Th-Sa (Hemodialysis) 10/10/2022 2036 10/10/22 0650   10/10/22 0800  cefTRIAXone (ROCEPHIN) 2 g in sodium chloride 0.9 % 100 mL IVPB        2 g 200 mL/hr over 30 Minutes Intravenous Every 24 hours 10/10/22 0650 10/14/22 0759   10/10/22 0800  azithromycin (ZITHROMAX) 500 mg in sodium chloride 0.9 % 250 mL IVPB  Status:  Discontinued        500 mg 250 mL/hr over 60 Minutes Intravenous Every 24 hours 10/10/22 0650 10/10/22 1431   10/09/22 1300  vancomycin (VANCOREADY) IVPB 750 mg/150 mL        750 mg 150 mL/hr over 60 Minutes Intravenous  Once 10/09/22 1219 10/09/22 1434   10/04/2022 2045  ceFEPIme (MAXIPIME) 1 g in sodium chloride 0.9 % 100 mL IVPB  Status:  Discontinued        1 g 200 mL/hr over 30 Minutes Intravenous Daily at bedtime 10/04/2022 2036 10/10/22 0650   09/25/2022 2045  vancomycin (VANCOREADY) IVPB 1500 mg/300 mL        1,500 mg 150 mL/hr over 120 Minutes Intravenous NOW 10/14/2022 2036 10/09/22 0058        MEDICATIONS: Scheduled Meds:  atorvastatin  40 mg Oral QHS   Chlorhexidine Gluconate Cloth  6 each Topical Q0600   heparin  5,000 Units Subcutaneous Q8H   hydrocerin   Topical TID   ipratropium-albuterol  3 mL Nebulization Q6H   methylPREDNISolone (SOLU-MEDROL) injection  40 mg Intravenous Q12H   Continuous Infusions:  albumin human     azithromycin 500 mg (10/10/22 1709)   cefTRIAXone (ROCEPHIN)  IV 2 g (10/11/22 0836)   PRN Meds:.albuterol, white petrolatum   I have personally reviewed following labs and imaging studies  LABORATORY DATA: CBC: Recent Labs  Lab 10/03/2022 1747 10/14/2022 1804 10/11/2022 2240 10/09/22 0410 10/10/22 0921 10/11/22 0146   WBC 5.5  --  5.4 6.4 5.9 9.1  NEUTROABS 4.0  --   --   --   --   --   HGB 13.1 13.6 12.7* 12.1* 11.4* 12.2*  HCT 38.4* 40.0 37.7* 36.2* 34.9* 36.8*  MCV 97.5  --  97.4 97.8 99.1 97.9  PLT 201  --  129* 134* 134* 147*     Basic Metabolic Panel: Recent Labs  Lab 09/30/2022 2128 10/07/2022 2240 10/09/22 0410 10/10/22 0921 10/11/22 0146  NA 130* 131* 129* 128* 131*  K 4.0 4.1 4.0 4.2 4.4  CL 88* 92* 89* 90* 91*  CO2 '28 25 27 23 25  '$ GLUCOSE 82 83 141* 221* 145*  BUN '15 16 19 '$ 35* 29*  CREATININE 3.50* 3.47* 3.94* 5.23* 3.64*  CALCIUM 7.9* 7.7* 7.7* 7.7* 8.1*  MG  --   --  1.6* 2.2  1.8  PHOS  --  2.5 2.7 4.1 3.4     GFR: Estimated Creatinine Clearance: 14.6 mL/min (A) (by C-G formula based on SCr of 3.64 mg/dL (H)).  Liver Function Tests: Recent Labs  Lab 10/06/2022 2240 10/09/22 0410 10/10/22 0921 10/11/22 0146  AST  --  27  --   --   ALT  --  11  --   --   ALKPHOS  --  63  --   --   BILITOT  --  1.2  --   --   PROT  --  7.7  --   --   ALBUMIN 3.4* 3.4* 3.4* 3.4*    No results for input(s): "LIPASE", "AMYLASE" in the last 168 hours. No results for input(s): "AMMONIA" in the last 168 hours.  Coagulation Profile: No results for input(s): "INR", "PROTIME" in the last 168 hours.  Cardiac Enzymes: No results for input(s): "CKTOTAL", "CKMB", "CKMBINDEX", "TROPONINI" in the last 168 hours.  BNP (last 3 results) No results for input(s): "PROBNP" in the last 8760 hours.  Lipid Profile: No results for input(s): "CHOL", "HDL", "LDLCALC", "TRIG", "CHOLHDL", "LDLDIRECT" in the last 72 hours.  Thyroid Function Tests: No results for input(s): "TSH", "T4TOTAL", "FREET4", "T3FREE", "THYROIDAB" in the last 72 hours.  Anemia Panel: No results for input(s): "VITAMINB12", "FOLATE", "FERRITIN", "TIBC", "IRON", "RETICCTPCT" in the last 72 hours.  Urine analysis:    Component Value Date/Time   COLORURINE YELLOW 03/15/2019 [place] 03/15/2019 1644   LABSPEC  1.013 03/15/2019 1644   PHURINE 5.0 03/15/2019 1644   GLUCOSEU 50 (A) 03/15/2019 1644   HGBUR SMALL (A) 03/15/2019 1644   BILIRUBINUR NEGATIVE 03/15/2019 1644   KETONESUR NEGATIVE 03/15/2019 1644   PROTEINUR >=300 (A) 03/15/2019 1644   NITRITE NEGATIVE 03/15/2019 1644   LEUKOCYTESUR NEGATIVE 03/15/2019 1644    Sepsis Labs: Lactic Acid, Venous    Component Value Date/Time   LATICACIDVEN 1.6 06/25/2021 1707    MICROBIOLOGY: Recent Results (from the past 240 hour(s))  Resp panel by RT-PCR (RSV, Flu A&B, Covid) Anterior Nasal Swab     Status: None   Collection Time: 10/19/2022  6:46 PM   Specimen: Anterior Nasal Swab  Result Value Ref Range Status   SARS Coronavirus 2 by RT PCR NEGATIVE NEGATIVE Final    Comment: (NOTE) SARS-CoV-2 target nucleic acids are NOT DETECTED.  The SARS-CoV-2 RNA is generally detectable in upper respiratory specimens during the acute phase of infection. The lowest concentration of SARS-CoV-2 viral copies this assay can detect is 138 copies/mL. A negative result does not preclude SARS-Cov-2 infection and should not be used as the sole basis for treatment or other patient management decisions. A negative result may occur with  improper specimen collection/handling, submission of specimen other than nasopharyngeal swab, presence of viral mutation(s) within the areas targeted by this assay, and inadequate number of viral copies(<138 copies/mL). A negative result must be combined with clinical observations, patient history, and epidemiological information. The expected result is Negative.  Fact Sheet for Patients:  EntrepreneurPulse.com.au  Fact Sheet for Healthcare Providers:  IncredibleEmployment.be  This test is no t yet approved or cleared by the [place] FDA and  has been authorized for detection and/or diagnosis of SARS-CoV-2 by FDA under an Emergency Use Authorization (EUA). This EUA will remain  in effect  (meaning this test can be used) for the duration of the COVID-19 declaration under Section 564(b)(1) of the Act, 21 U.S.C.section 360bbb-3(b)(1), unless the authorization is terminated  or revoked sooner.       Influenza A by PCR NEGATIVE NEGATIVE Final   Influenza B by PCR NEGATIVE NEGATIVE Final    Comment: (NOTE) The Xpert Xpress SARS-CoV-2/FLU/RSV plus assay is intended as an aid in the diagnosis of influenza from Nasopharyngeal swab specimens and should not be used as a sole basis for treatment. Nasal washings and aspirates are unacceptable for Xpert Xpress SARS-CoV-2/FLU/RSV testing.  Fact Sheet for Patients: EntrepreneurPulse.com.au  Fact Sheet for Healthcare Providers: IncredibleEmployment.be  This test is not yet approved or cleared by the [place] FDA and has been authorized for detection and/or diagnosis of SARS-CoV-2 by FDA under an Emergency Use Authorization (EUA). This EUA will remain in effect (meaning this test can be used) for the duration of the COVID-19 declaration under Section 564(b)(1) of the Act, 21 U.S.C. section 360bbb-3(b)(1), unless the authorization is terminated or revoked.     Resp Syncytial Virus by PCR NEGATIVE NEGATIVE Final    Comment: (NOTE) Fact Sheet for Patients: EntrepreneurPulse.com.au  Fact Sheet for Healthcare Providers: IncredibleEmployment.be  This test is not yet approved or cleared by the [place] FDA and has been authorized for detection and/or diagnosis of SARS-CoV-2 by FDA under an Emergency Use Authorization (EUA). This EUA will remain in effect (meaning this test can be used) for the duration of the COVID-19 declaration under Section 564(b)(1) of the Act, 21 U.S.C. section 360bbb-3(b)(1), unless the authorization is terminated or revoked.  Performed at [facility], [place] [address], [place], [place] [address]   MRSA Next Gen by PCR,  Nasal     Status: None   Collection Time: 10/09/22  1:36 PM   Specimen: Nasal Mucosa; Nasal Swab  Result Value Ref Range Status   MRSA by PCR Next Gen NOT DETECTED NOT DETECTED Final    Comment: (NOTE) The GeneXpert MRSA Assay (FDA approved for NASAL specimens only), is one component of a comprehensive MRSA colonization surveillance program. It is not intended to diagnose MRSA infection nor to guide or monitor treatment for MRSA infections. Test performance is not FDA approved in patients less than 45 years old. Performed at [facility], [place] [address], [place], [place] [address]     RADIOLOGY STUDIES/RESULTS: DG Chest Port 1 View  Result Date: 10/10/2022 CLINICAL DATA:  Shortness of breath EXAM: PORTABLE CHEST 1 VIEW COMPARISON:  Chest x-ray dated October 08, 2022 FINDINGS: Unchanged cardiomegaly. Stable position of right central venous catheter. Stable moderate right pleural effusion associated atelectasis. Evidence of pneumothorax. IMPRESSION: 1. Stable moderate right pleural effusion associated atelectasis. 2. Unchanged cardiomegaly. Electronically Signed   By: Yetta Glassman M.D.   On: 10/10/2022 08:25     LOS: 3 days   Oren Binet, MD  [facility]    To contact the attending provider between 7A-7P or the covering provider during after hours 7P-7A, please log into the web site [email or web address] and access using universal [place] password for that web site. If you do not have the password, please call the hospital operator.  10/11/2022, 9:11 AM

## 2022-10-11 NOTE — TOC Initial Note (Addendum)
Transition of Care (TOC) - Initial/Assessment Note    Patient Details  Name: [name]. MRN: [id number] Date of Birth: 01-Dec-1940  Transition of Care [facility]) CM/SW Contact:    Bjorn Pippin, LCSW Phone Number: 10/11/2022, 2:10 PM  Clinical Narrative:                 CSW met with pt at bedside to discuss recommendation for SNF. Pt is agreeable to go to SNF but did express concerns on leaving his house vacant for a couple of weeks. Pt stated he would reach out to a friend to discuss paying rent, bills etc. CSW explained to pt that this will be short term.  Pt stated that he does HD at [place]  through the [place]. CSW confirm that pt does receive HD through [facility]. Pt reported that he would like to go to a SNF facility in [place]. CSW completed fl2 and faxed out to facilities in [place]. TOC will continue to follow.   14:30: CSW contacted [facility] to learn of eligible SNF's covered by pt's VA insurance. CSW could not get in contact with anyone and left VM.   16:00 VA social worker contacted CSW to inform her that pt will need to be screened by the [place]. CSW faxed pt documentation over to [facility] Screening department.   Expected Discharge Plan: Skilled Nursing Facility Barriers to Discharge: Continued Medical Work up   Patient Goals and CMS Choice            Expected Discharge Plan and Services       Living arrangements for the past 2 months: Apartment                                      Prior Living Arrangements/Services Living arrangements for the past 2 months: Apartment Lives with:: Self   Do you feel safe going back to the place where you live?: Yes            Criminal Activity/Legal Involvement Pertinent to Current Situation/Hospitalization: No - Comment as needed  Activities of Daily Living Home Assistive Devices/Equipment: Wheelchair, Environmental consultant (specify type), Cane (specify quad or straight) ADL Screening (condition at time of  admission) Patient's cognitive ability adequate to safely complete daily activities?: Yes Is the patient deaf or have difficulty hearing?: No Does the patient have difficulty seeing, even when wearing glasses/contacts?: No Does the patient have difficulty concentrating, remembering, or making decisions?: No Patient able to express need for assistance with ADLs?: Yes Does the patient have difficulty dressing or bathing?: No Independently performs ADLs?: No Bathing: Needs assistance Toileting: Needs assistance Does the patient have difficulty walking or climbing stairs?: Yes Weakness of Legs: Both Weakness of Arms/Hands: None  Permission Sought/Granted                  Emotional Assessment Appearance:: Appears stated age Attitude/Demeanor/Rapport: Engaged Affect (typically observed): Accepting, Adaptable Orientation: : Oriented to Self, Oriented to Place, Oriented to  Time, Oriented to Situation   Psych Involvement: No (comment)  Admission diagnosis:  Acute respiratory distress [R06.03] Pleural effusion [J90] HCAP (healthcare-associated pneumonia) [J18.9] Patient Active Problem List   Diagnosis Date Noted   Acute respiratory distress 09/23/2022   Pleural effusion 04/08/2022   Liver cirrhosis ([place]) 11/26/2021   Homeless single person 11/26/2021   Chronic obstructive lung disease ([place]) 11/26/2021   Type 2 diabetes mellitus with  chronic kidney disease, without long-term current use of insulin ([place]) 11/26/2021   ESRD on dialysis ([place]) 11/26/2021   Pleural effusion on right 11/26/2021   COPD with acute exacerbation ([place]) 11/26/2021   Polysubstance abuse ([place]) 75/44/9201   Chronic systolic CHF (congestive heart failure) ([place]) 11/26/2021   End stage renal disease (HCC)    Anemia    Acute combined systolic and diastolic heart failure (HCC)    NSVT (nonsustained ventricular tachycardia) (HCC)    LBBB (left bundle branch block)    Class 2 severe obesity due to excess calories  with serious comorbidity and body mass index (BMI) of 38.0 to 38.9 in adult [facility])    Chest pain 04/19/2019   Shortness of breath 04/19/2019   Malignant neoplasm of prostate ([place]) 07/06/2018   Hypoglycemia 03/29/2017   Coronary artery disease    Hypertension    Diabetes mellitus    Chronic kidney disease    Alcohol abuse 01/19/2004   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 [place] [address] Phone: [phone] Fax: [phone]     Social Determinants of Health (SDOH) Social History: SDOH Screenings   Food Insecurity: No Food Insecurity (10/10/2022)  Housing: Low Risk  (10/10/2022)  Transportation Needs: No Transportation Needs (10/10/2022)  Utilities: Not At Risk (10/10/2022)  Tobacco Use: Medium Risk (10/10/2022)   SDOH Interventions:     Readmission Risk Interventions     No data to display         Beckey Rutter, MSW, LCSWA, LCASA Transitions of Care  Clinical Social Worker I

## 2022-10-11 NOTE — Progress Notes (Signed)
[facility] Progress Note   Assessment/ Plan:   Dialysis Orders:  [facility] - per last admission  4hr 400/500 EDW 74.5 kg   2/2.5 Elisio Heparin 3K bolus, Hectorol 63mg   Assessment/Plan: Acute hypoxic respiratory failure/volume overload - Well over dry weight by weights here.  Improved with dialysis.   Bilateral pleural effusions - IR consult for thoracentesis- holding off for now.  ESRD -  HD TTS. Had full treatment Tuesday. Extra UF 1/17, 1/18.  Next HD 1/20   Hypertension/volume  - Bps lowish at start of treatment. UF as able  Anemia  - Hb above goal. No ESA needs currently  Metabolic bone disease -  Ca/Phos ok. Continue home meds.  Nutrition - Renal diet with fluid restriction HFrEF: EF 15-20%  Subjective:    Seen in room.  Breathing better but still on O2.  Thora cancelled today per primary   Objective:   BP 94/78 (BP Location: Right Arm)   Pulse 98   Temp 98.1 F (36.7 C) (Axillary)   Resp 19   Ht '5\' 6"'$  (1.676 m)   Wt 72.6 kg   SpO2 99%   BMI 25.83 kg/m   Physical Exam: GLTJ:[id number] O2, appears more comfortable CVS: RRR Resp: normal WOB now Abd: soft Ext: 1+ LE edema, improvied ACCESS: R IJ TDC  Labs: BMET Recent Labs  Lab 10/05/2022 1744 10/16/2022 1804 10/04/2022 2128 10/02/2022 2240 10/09/22 0410 10/10/22 0921 10/11/22 0146  NA 128* 129* 130* 131* 129* 128* 131*  K 5.5* 3.6 4.0 4.1 4.0 4.2 4.4  CL 92*  --  88* 92* 89* 90* 91*  CO2  --   --  '28 25 27 23 25  '$ GLUCOSE 88  --  82 83 141* 221* 145*  BUN 20  --  '15 16 19 '$ 35* 29*  CREATININE 3.40*  --  3.50* 3.47* 3.94* 5.23* 3.64*  CALCIUM  --   --  7.9* 7.7* 7.7* 7.7* 8.1*  PHOS  --   --   --  2.5 2.7 4.1 3.4   CBC Recent Labs  Lab 10/01/2022 1747 10/09/2022 1804 10/02/2022 2240 10/09/22 0410 10/10/22 0921 10/11/22 0146  WBC 5.5  --  5.4 6.4 5.9 9.1  NEUTROABS 4.0  --   --   --   --   --   HGB 13.1   < > 12.7* 12.1* 11.4* 12.2*  HCT 38.4*   < > 37.7* 36.2* 34.9* 36.8*   MCV 97.5  --  97.4 97.8 99.1 97.9  PLT 201  --  129* 134* 134* 147*   < > = values in this interval not displayed.      Medications:     atorvastatin  40 mg Oral QHS   Chlorhexidine Gluconate Cloth  6 each Topical Q0600   heparin  5,000 Units Subcutaneous Q8H   hydrocerin   Topical TID   ipratropium-albuterol  3 mL Nebulization Q6H   [START ON 10/12/2022] predniSONE  40 mg Oral Q breakfast     EMadelon LipsMD 10/11/2022, 12:03 PM

## 2022-10-11 NOTE — Evaluation (Signed)
Occupational Therapy Evaluation Patient Details Name: Hillman Attig. MRN: [id number] DOB: Jan 23, 1941 Today's Date: 10/11/2022   History of Present Illness Pt is an 82 y.o. male admitted 1/16 for acute respiratory distress, pulmonary edema. PMH:  ESRD on HD TTS, advanced heart failure, HFrEF 15-20%, hyperlipidemia, hypertension, excessive feet desquamation x 1 week   Clinical Impression   Pt seated at sink bathing upon therapy arrival and agreeable to participate in OT evaluation. Pt provides concern with bilateral foot pain which has been ongoing since beginning of this month. Pt began to experience bilateral foot pain. Went to walk in [place] clinic on 1/3 and saw Talmadge Coventry, [place]. He recommended using doxycycline and following up with podiatry. Pt reports that he has not had a chance to make an appointment with podiatry yet. He does report that using the doxycycline did help with the pain and he was able to walk with [place]. He reports leaving his cream at home and since has been experiencing increased pain. Pt has several deep gashes on the lateral and medial aspect of each foot. MD contacted on pt's current foot pain due to lack of medication. Educated pt to call [place] clinic since he reports conditioned has worsened and ask if he needs to see her PCP before making an appointment with podiatry. Pt verbalized understanding. Pt's foot pain is the only thing impeding his ability to function at prior level of function. Pt is at baseline for all BADL tasks. No acute OT needs identified and OT will sign off.       Recommendations for follow up therapy are one component of a multi-disciplinary discharge planning process, led by the attending physician.  Recommendations may be updated based on patient status, additional functional criteria and insurance authorization.   Follow Up Recommendations  No OT follow up     Assistance Recommended at Discharge PRN  Patient can return home with the following A  little help with walking and/or transfers;A little help with bathing/dressing/bathroom;Help with stairs or ramp for entrance;Assist for transportation;Assistance with cooking/housework    Functional Status Assessment  Patient has had a recent decline in their functional status and demonstrates the ability to make significant improvements in function in a reasonable and predictable amount of time.  Equipment Recommendations  None recommended by OT       Precautions / Restrictions Precautions Precautions: Other (comment);Fall Precaution Comments: watch sats Restrictions Weight Bearing Restrictions: No      Mobility Bed Mobility Overal bed mobility:  (up in chair sitting at sink upon therapy arrival)    Transfers Overall transfer level:  (NT)        Balance Overall balance assessment: No apparent balance deficits (not formally assessed) Sitting-balance support: No upper extremity supported, Feet supported Sitting balance-Leahy Scale: Good Sitting balance - Comments: sitting at sink bathing             ADL either performed or assessed with clinical judgement   ADL Overall ADL's : At baseline           General ADL Comments: Requires Min Assist for bathing and dressing while sponge bathing at sink which is baseline.     Vision Baseline Vision/History: 1 Wears glasses Ability to See in Adequate Light: 0 Adequate Patient Visual Report: No change from baseline Vision Assessment?: No apparent visual deficits            Pertinent Vitals/Pain Pain Assessment Pain Assessment: 0-10 Pain Score:  (no number provided during pain assessment. Reports  that his bilateral foot pain prevents him from walking as he was able to before.) Pain Location: bilateral feet Pain Descriptors / Indicators: Tender, Grimacing, Guarding, Discomfort Pain Intervention(s): Monitored during session     Hand Dominance Right   Extremity/Trunk Assessment Upper Extremity Assessment Upper  Extremity Assessment: Overall WFL for tasks assessed   Lower Extremity Assessment Lower Extremity Assessment: Defer to PT evaluation       Communication Communication Communication: No difficulties   Cognition Arousal/Alertness: Awake/alert Behavior During Therapy: WFL for tasks assessed/performed Overall Cognitive Status: Within Functional Limits for tasks assessed                    Home Living Family/patient expects to be discharged to:: Private residence Living Arrangements: Spouse/significant other (lives with girlfriend normally. Currently gf has COVID and has not been at home for a week.) Available Help at Discharge: Family;Personal care attendant (PCA provides assist 3X a week for 2hrs. Other times of the week, girlfriend provides assistance.) Type of Home: Apartment Home Access: Level entry     Home Layout: One level     Bathroom Shower/Tub: Walk-in shower (Pt reports with HD port he is required to spnge bathe as he can't get it wet)   Bathroom Toilet: Standard     Home Equipment: Cane - single point;Gaffer (4 wheels)   Additional Comments: no home O2 at baseline      Prior Functioning/Environment Prior Level of Function : Needs assist;Driving       Physical Assist : ADLs (physical)   ADLs (physical): Bathing Mobility Comments: RW in apartment mod I, scooter in community ADLs Comments: PCA assists with bathing and cooking        OT Problem List: Decreased strength      OT Treatment/Interventions:   Eval only   OT Goals(Current goals can be found in the care plan section) Acute Rehab OT Goals Patient Stated Goal: to decrease the pain in bilateral feet in order to walk again  OT Frequency:  1 time visit       AM-PAC OT "6 Clicks" Daily Activity     Outcome Measure Help from another person eating meals?: None Help from another person taking care of personal grooming?: None Help from another person toileting, which includes  using toliet, bedpan, or urinal?: None Help from another person bathing (including washing, rinsing, drying)?: A Little Help from another person to put on and taking off regular upper body clothing?: None Help from another person to put on and taking off regular lower body clothing?: A Little 6 Click Score: 22   End of Session Nurse Communication: Other (comment) (OT evaluation complete)  Activity Tolerance: Patient tolerated treatment well Patient left: in chair;Other (comment) (at sink)  OT Visit Diagnosis: Muscle weakness (generalized) (M62.81)                Time: 1017-5102 OT Time Calculation (min): 23 min Charges:  OT General Charges $OT Visit: 1 Visit OT Evaluation $OT Eval Low Complexity: 1 Low  Ailene Ravel, OTR/L,CBIS  Supplemental OT - [facility] and [facility] Secure Chat Preferred    Metta Koranda, Clarene Duke 10/11/2022, 1:29 PM

## 2022-10-11 NOTE — NC FL2 (Signed)
[place] LEVEL OF CARE FORM     IDENTIFICATION  Patient Name: [name]. Birthdate: 1941/02/01 Sex: male Admission Date (Current Location): 09/25/2022  [facility] and [place] Number:  Herbalist and Address:         Provider Number: [id number]  Attending Physician Name and Address:  Jonetta Osgood, MD  Relative Name and Phone Number:       Current Level of Care: Hospital Recommended Level of Care: [place] Prior Approval Number:    Date Approved/Denied:   PASRR Number: [id number]  Discharge Plan: SNF    Current Diagnoses: Patient Active Problem List   Diagnosis Date Noted   Acute respiratory distress 10/14/2022   Pleural effusion 04/08/2022   Liver cirrhosis ([place]) 11/26/2021   Homeless single person 11/26/2021   Chronic obstructive lung disease ([place]) 11/26/2021   Type 2 diabetes mellitus with chronic kidney disease, without long-term current use of insulin ([place]) 11/26/2021   ESRD on dialysis ([place]) 11/26/2021   Pleural effusion on right 11/26/2021   COPD with acute exacerbation ([place]) 11/26/2021   Polysubstance abuse ([place]) 94/17/4081   Chronic systolic CHF (congestive heart failure) ([place]) 11/26/2021   End stage renal disease ([place])    Anemia    Acute combined systolic and diastolic heart failure (HCC)    NSVT (nonsustained ventricular tachycardia) (HCC)    LBBB (left bundle branch block)    Class 2 severe obesity due to excess calories with serious comorbidity and body mass index (BMI) of 38.0 to 38.9 in adult [facility])    Chest pain 04/19/2019   Shortness of breath 04/19/2019   Malignant neoplasm of prostate ([place]) 07/06/2018   Hypoglycemia 03/29/2017   Coronary artery disease    Hypertension    Diabetes mellitus    Chronic kidney disease    Alcohol abuse 01/19/2004   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  01/19/2004    Orientation RESPIRATION BLADDER Height & Weight     Self, Time, Situation,  Place  O2 (2 liters) Continent Weight: 160 lb 0.9 oz (72.6 kg) Height:  '5\' 6"'$  (167.6 cm)  BEHAVIORAL SYMPTOMS/MOOD NEUROLOGICAL BOWEL NUTRITION STATUS      Continent Diet (See dc summary)  AMBULATORY STATUS COMMUNICATION OF NEEDS Skin   Extensive Assist Verbally Normal                       Personal Care Assistance Level of Assistance  Bathing, Feeding, Dressing Bathing Assistance: Maximum assistance Feeding assistance: Independent Dressing Assistance: Maximum assistance     Functional Limitations Info  Sight, Hearing, Speech Sight Info: Adequate Hearing Info: Adequate Speech Info: Adequate    SPECIAL CARE FACTORS FREQUENCY  PT (By licensed PT), OT (By licensed OT)     PT Frequency: 5xweek OT Frequency: 5xweek            Contractures Contractures Info: Not present    Additional Factors Info  Code Status Code Status Info: Full             Current Medications (10/11/2022):  This is the current hospital active medication list Current Facility-Administered Medications  Medication Dose Route Frequency Provider Last Rate Last Admin   albuterol (PROVENTIL) (2.5 MG/3ML) 0.083% nebulizer solution 2.5 mg  2.5 mg Nebulization Q2H PRN Jonetta Osgood, MD   2.5 mg at 10/10/22 1826   atorvastatin (LIPITOR) tablet 40 mg  40 mg Oral QHS Ghimire, Henreitta Leber, MD   40 mg at  10/10/22 2201   azithromycin (ZITHROMAX) 500 mg in sodium chloride 0.9 % 250 mL IVPB  500 mg Intravenous Q24H Jonetta Osgood, MD 250 mL/hr at 10/10/22 1709 500 mg at 10/10/22 1709   cefTRIAXone (ROCEPHIN) 2 g in sodium chloride 0.9 % 100 mL IVPB  2 g Intravenous Q24H Jonetta Osgood, MD 200 mL/hr at 10/11/22 0836 2 g at 10/11/22 0836   Chlorhexidine Gluconate Cloth 2 % PADS 6 each  6 each Topical Q0600 Corliss Parish, MD   6 each at 10/10/22 0228   heparin injection 5,000 Units  5,000 Units Subcutaneous Q8H Kayleen Memos, DO   5,000 Units at 10/11/22 6283   hydrocerin (EUCERIN) cream   Topical  TID Jonetta Osgood, MD   Given at 10/11/22 0836   ipratropium-albuterol (DUONEB) 0.5-2.5 (3) MG/3ML nebulizer solution 3 mL  3 mL Nebulization Q6H Ghimire, Henreitta Leber, MD   3 mL at 10/11/22 0756   [START ON 10/12/2022] predniSONE (DELTASONE) tablet 40 mg  40 mg Oral Q breakfast Ghimire, Henreitta Leber, MD       white petrolatum (VASELINE) gel   Topical PRN Ghimire, Henreitta Leber, MD         Discharge Medications: Please see discharge summary for a list of discharge medications.  Relevant Imaging Results:  Relevant Lab Results:   Additional Information SSN: [id number]  Beckey Rutter, MSW, [place] Transitions of Care  Clinical Social Worker I

## 2022-10-12 DIAGNOSIS — R0603 Acute respiratory distress: Secondary | ICD-10-CM | POA: Diagnosis not present

## 2022-10-12 DIAGNOSIS — N186 End stage renal disease: Secondary | ICD-10-CM | POA: Diagnosis not present

## 2022-10-12 DIAGNOSIS — J189 Pneumonia, unspecified organism: Secondary | ICD-10-CM | POA: Diagnosis not present

## 2022-10-12 DIAGNOSIS — J90 Pleural effusion, not elsewhere classified: Secondary | ICD-10-CM | POA: Diagnosis not present

## 2022-10-12 LAB — CBC
HCT: 38.6 % — ABNORMAL LOW (ref 39.0–52.0)
Hemoglobin: 12.5 g/dL — ABNORMAL LOW (ref 13.0–17.0)
MCH: 32.1 pg (ref 26.0–34.0)
MCHC: 32.4 g/dL (ref 30.0–36.0)
MCV: 99.2 fL (ref 80.0–100.0)
Platelets: 115 10*3/uL — ABNORMAL LOW (ref 150–400)
RBC: 3.89 MIL/uL — ABNORMAL LOW (ref 4.22–5.81)
RDW: 15.2 % (ref 11.5–15.5)
WBC: 10.8 10*3/uL — ABNORMAL HIGH (ref 4.0–10.5)

## 2022-10-12 LAB — RENAL FUNCTION PANEL
Albumin: 3.2 g/dL — ABNORMAL LOW (ref 3.5–5.0)
Anion gap: 14 (ref 5–15)
BUN: 51 mg/dL — ABNORMAL HIGH (ref 8–23)
CO2: 25 mmol/L (ref 22–32)
Calcium: 7.8 mg/dL — ABNORMAL LOW (ref 8.9–10.3)
Chloride: 89 mmol/L — ABNORMAL LOW (ref 98–111)
Creatinine, Ser: 4.82 mg/dL — ABNORMAL HIGH (ref 0.61–1.24)
GFR, Estimated: 12 mL/min — ABNORMAL LOW (ref >60)
Glucose, Bld: 188 mg/dL — ABNORMAL HIGH (ref 70–99)
Phosphorus: 3.5 mg/dL (ref 2.5–4.6)
Potassium: 3.6 mmol/L (ref 3.5–5.1)
Sodium: 128 mmol/L — ABNORMAL LOW (ref 135–145)

## 2022-10-12 MED ORDER — REVEFENACIN 175 MCG/3ML IN SOLN
175.0000 ug | Freq: Every day | RESPIRATORY_TRACT | Status: DC
  Administered 2022-10-13 – 2022-10-20 (×7): 175 ug via RESPIRATORY_TRACT
  Filled 2022-10-12 (×7): qty 3

## 2022-10-12 MED ORDER — BUDESONIDE 0.25 MG/2ML IN SUSP
0.2500 mg | Freq: Two times a day (BID) | RESPIRATORY_TRACT | Status: DC
  Administered 2022-10-12 – 2022-10-20 (×15): 0.25 mg via RESPIRATORY_TRACT
  Filled 2022-10-12 (×15): qty 2

## 2022-10-12 MED ORDER — ARFORMOTEROL TARTRATE 15 MCG/2ML IN NEBU
15.0000 ug | INHALATION_SOLUTION | Freq: Two times a day (BID) | RESPIRATORY_TRACT | Status: DC
  Administered 2022-10-12 – 2022-10-20 (×15): 15 ug via RESPIRATORY_TRACT
  Filled 2022-10-12 (×14): qty 2

## 2022-10-12 MED ORDER — IPRATROPIUM-ALBUTEROL 0.5-2.5 (3) MG/3ML IN SOLN
3.0000 mL | Freq: Three times a day (TID) | RESPIRATORY_TRACT | Status: DC
  Administered 2022-10-13 – 2022-10-20 (×20): 3 mL via RESPIRATORY_TRACT
  Filled 2022-10-12 (×6): qty 3
  Filled 2022-10-12: qty 42
  Filled 2022-10-12 (×13): qty 3

## 2022-10-12 MED ORDER — HEPARIN SODIUM (PORCINE) 1000 UNIT/ML IJ SOLN
INTRAMUSCULAR | Status: AC
  Filled 2022-10-12: qty 4

## 2022-10-12 MED ORDER — IPRATROPIUM-ALBUTEROL 0.5-2.5 (3) MG/3ML IN SOLN: 3.0000 mL | Freq: Two times a day (BID) | RESPIRATORY_TRACT | Status: DC

## 2022-10-12 MED ORDER — AZITHROMYCIN 250 MG PO TABS
500.0000 mg | ORAL_TABLET | Freq: Once | ORAL | Status: AC
  Administered 2022-10-12: 500 mg via ORAL
  Filled 2022-10-12: qty 2

## 2022-10-12 NOTE — Progress Notes (Signed)
Pt received in bed no c/os no distress noted stable for HD via [place]

## 2022-10-12 NOTE — Progress Notes (Signed)
   10/12/22 1600  Mobility  Activity Refused mobility   Mobility Specialist Progress Note  Pt refused mobility d/t foot pain. Will f/u as able.    Lucious Groves Mobility Specialist  Please contact via SecureChat or Rehab office at [phone]

## 2022-10-12 NOTE — Progress Notes (Addendum)
PROGRESS NOTE        PATIENT DETAILS Name: [name]. Age: 82 y.o. Sex: male Date of Birth: 08-14-1941 Admit Date: 10/12/2022 Admitting Physician Kayleen Memos, DO UDJ:[id number], [place]ib[place], MD  Brief Summary: Patient is a 82 y.o.  male with history of ESRD on HD TTS, HFrEF, COPD, HTN, HLD who presented with SOB x 1 day.  Patient was found to have acute hypoxic respiratory failure-requiring BiPAP on initial presentation.  Significant events: 1/16>> SOB-hypoxia-requiring BiPAP.  Last HD on 1/16 as an outpatient.  Significant studies: 1/16>> CXR: Moderate right pleural effusion-right basilar consolidation.  Significant microbiology data: 1/16>> COVID/influenza/RSV PCR: Negative  Procedures: None  Consults: Nephrology  Subjective: Feels much better-breathing has significantly improved hardly any wheezing today.  Mostly on room air per nursing staff but requiring only intermittent oxygen at times.  Objective: Vitals: Blood pressure 105/78, pulse 99, temperature 98.9 F (37.2 C), temperature source Oral, resp. rate (!) 22, height '5\' 6"'$  (1.676 m), weight 75.8 kg, SpO2 93 %.   Exam: Gen Exam:Alert awake-not in any distress HEENT:atraumatic, normocephalic Chest: B/L clear to auscultation anteriorly CVS:S1S2 regular Abdomen:soft non tender, non distended Extremities:no edema Neurology: Non focal Skin: no rash  Pertinent Labs/Radiology:    Latest Ref Rng & Units 10/12/2022    1:08 AM 10/11/2022    1:46 AM 10/10/2022    9:21 AM  CBC  WBC 4.0 - 10.5 K/uL 10.8  9.1  5.9   Hemoglobin 13.0 - 17.0 g/dL 12.5  12.2  11.4   Hematocrit 39.0 - 52.0 % 38.6  36.8  34.9   Platelets 150 - 400 K/uL 115  147  134     Lab Results  Component Value Date   NA 128 (L) 10/12/2022   K 3.6 10/12/2022   CL 89 (L) 10/12/2022   CO2 25 10/12/2022      Assessment/Plan: Acute hypoxic respiratory failure-multifactorial etiology-right lobar pneumonia/pleural  effusion/COPD exacerbation and some amount of volume overload in the setting of ESRD  Overall much better after treatment of underlying etiologies Continue steroid taper Continue empiric Rocephin/Zithromax x 5 days Volume status much better after hemodialysis  Continue bronchodilators.  Chronic right pleural effusion Reviewed chart-prior notes-appears to have a chronic right pleural effusion-that has been tapped in the past.  Last thoracocentesis November 2023-was a transudate.   Given overall improvement-and clinical scenario-he is unlikely to have a parapneumonic effusion. No plans to proceed with thoracocentesis-monitor clinically for now.   Acute on chronic HFrEF Volume overload Although no obvious volume overload-Per nephrology-he was beyond his dry weight on initial presentation Hemodialysis being managed by nephrology service.  ESRD on HD TTS Nephrology now following.  Hyponatremia Mild-asymptomatic Should improve with ongoing HD.  Hypomagnesemia Repleted  HTN BP currently soft in the low 785Y systolic Metoprolol/losartan on hold-resume over the next few days  HLD Statin  Thrombocytopenia Mild Seems to be a chronic issue ongoing for the past several years. Repeat CBC periodically.  Desquamation of skin of bilateral lower extremities eucerin ointment  BMI: Estimated body mass index is 26.97 kg/m as calculated from the following:   Height as of this encounter: '5\' 6"'$  (1.676 m).   Weight as of this encounter: 75.8 kg.   Code status:   Code Status: Full Code   DVT Prophylaxis: heparin injection 5,000 Units Start: 10/11/2022 2200   Family  Communication: None at bedside   Disposition Plan: Status is: Inpatient Remains inpatient appropriate because: Severity of illness.   Planned Discharge Destination:SNF   Diet: Diet Order             Diet renal with fluid restriction Fluid restriction: 1200 mL Fluid; Room service appropriate? Yes; Fluid consistency:  Thin  Diet effective now                     Antimicrobial agents: Anti-infectives (From admission, onward)    Start     Dose/Rate Route Frequency Ordered Stop   10/10/22 1700  azithromycin (ZITHROMAX) 500 mg in sodium chloride 0.9 % 250 mL IVPB        500 mg 250 mL/hr over 60 Minutes Intravenous Every 24 hours 10/10/22 1429 10/13/22 1659   10/10/22 1200  vancomycin (VANCOREADY) IVPB 750 mg/150 mL  Status:  Discontinued        750 mg 150 mL/hr over 60 Minutes Intravenous Every T-Th-Sa (Hemodialysis) 09/28/2022 2036 10/10/22 0650   10/10/22 0800  cefTRIAXone (ROCEPHIN) 2 g in sodium chloride 0.9 % 100 mL IVPB        2 g 200 mL/hr over 30 Minutes Intravenous Every 24 hours 10/10/22 0650 10/14/22 0759   10/10/22 0800  azithromycin (ZITHROMAX) 500 mg in sodium chloride 0.9 % 250 mL IVPB  Status:  Discontinued        500 mg 250 mL/hr over 60 Minutes Intravenous Every 24 hours 10/10/22 0650 10/10/22 1431   10/09/22 1300  vancomycin (VANCOREADY) IVPB 750 mg/150 mL        750 mg 150 mL/hr over 60 Minutes Intravenous  Once 10/09/22 1219 10/09/22 1434   10/07/2022 2045  ceFEPIme (MAXIPIME) 1 g in sodium chloride 0.9 % 100 mL IVPB  Status:  Discontinued        1 g 200 mL/hr over 30 Minutes Intravenous Daily at bedtime 10/16/2022 2036 10/10/22 0650   10/20/2022 2045  vancomycin (VANCOREADY) IVPB 1500 mg/300 mL        1,500 mg 150 mL/hr over 120 Minutes Intravenous NOW 10/05/2022 2036 10/09/22 0058        MEDICATIONS: Scheduled Meds:  atorvastatin  40 mg Oral QHS   Chlorhexidine Gluconate Cloth  6 each Topical Q0600   heparin  5,000 Units Subcutaneous Q8H   hydrocerin   Topical TID   ipratropium-albuterol  3 mL Nebulization Q6H   predniSONE  40 mg Oral Q breakfast   Continuous Infusions:  azithromycin Stopped (10/11/22 1647)   cefTRIAXone (ROCEPHIN)  IV 2 g (10/12/22 0756)   PRN Meds:.albuterol, white petrolatum   I have personally reviewed following labs and imaging  studies  LABORATORY DATA: CBC: Recent Labs  Lab 10/18/2022 1747 10/14/2022 1804 09/24/2022 2240 10/09/22 0410 10/10/22 0921 10/11/22 0146 10/12/22 0108  WBC 5.5  --  5.4 6.4 5.9 9.1 10.8*  NEUTROABS 4.0  --   --   --   --   --   --   HGB 13.1   < > 12.7* 12.1* 11.4* 12.2* 12.5*  HCT 38.4*   < > 37.7* 36.2* 34.9* 36.8* 38.6*  MCV 97.5  --  97.4 97.8 99.1 97.9 99.2  PLT 201  --  129* 134* 134* 147* 115*   < > = values in this interval not displayed.     Basic Metabolic Panel: Recent Labs  Lab 09/27/2022 2240 10/09/22 0410 10/10/22 0921 10/11/22 0146 10/12/22 0840  NA 131* 129* 128* 131* 128*  K 4.1 4.0 4.2 4.4 3.6  CL 92* 89* 90* 91* 89*  CO2 '25 27 23 25 25  '$ GLUCOSE 83 141* 221* 145* 188*  BUN 16 19 35* 29* 51*  CREATININE 3.47* 3.94* 5.23* 3.64* 4.82*  CALCIUM 7.7* 7.7* 7.7* 8.1* 7.8*  MG  --  1.6* 2.2 1.8  --   PHOS 2.5 2.7 4.1 3.4 3.5     GFR: Estimated Creatinine Clearance: 11 mL/min (A) (by C-G formula based on SCr of 4.82 mg/dL (H)).  Liver Function Tests: Recent Labs  Lab 10/06/2022 2240 10/09/22 0410 10/10/22 0921 10/11/22 0146 10/12/22 0840  AST  --  27  --   --   --   ALT  --  11  --   --   --   ALKPHOS  --  63  --   --   --   BILITOT  --  1.2  --   --   --   PROT  --  7.7  --   --   --   ALBUMIN 3.4* 3.4* 3.4* 3.4* 3.2*    No results for input(s): "LIPASE", "AMYLASE" in the last 168 hours. No results for input(s): "AMMONIA" in the last 168 hours.  Coagulation Profile: No results for input(s): "INR", "PROTIME" in the last 168 hours.  Cardiac Enzymes: No results for input(s): "CKTOTAL", "CKMB", "CKMBINDEX", "TROPONINI" in the last 168 hours.  BNP (last 3 results) No results for input(s): "PROBNP" in the last 8760 hours.  Lipid Profile: No results for input(s): "CHOL", "HDL", "LDLCALC", "TRIG", "CHOLHDL", "LDLDIRECT" in the last 72 hours.  Thyroid Function Tests: No results for input(s): "TSH", "T4TOTAL", "FREET4", "T3FREE", "THYROIDAB" in  the last 72 hours.  Anemia Panel: No results for input(s): "VITAMINB12", "FOLATE", "FERRITIN", "TIBC", "IRON", "RETICCTPCT" in the last 72 hours.  Urine analysis:    Component Value Date/Time   COLORURINE YELLOW 03/15/2019 [place] 03/15/2019 1644   LABSPEC 1.013 03/15/2019 1644   PHURINE 5.0 03/15/2019 1644   GLUCOSEU 50 (A) 03/15/2019 1644   HGBUR SMALL (A) 03/15/2019 1644   BILIRUBINUR NEGATIVE 03/15/2019 1644   KETONESUR NEGATIVE 03/15/2019 1644   PROTEINUR >=300 (A) 03/15/2019 1644   NITRITE NEGATIVE 03/15/2019 1644   LEUKOCYTESUR NEGATIVE 03/15/2019 1644    Sepsis Labs: Lactic Acid, Venous    Component Value Date/Time   LATICACIDVEN 1.6 06/25/2021 1707    MICROBIOLOGY: Recent Results (from the past 240 hour(s))  Resp panel by RT-PCR (RSV, Flu A&B, Covid) Anterior Nasal Swab     Status: None   Collection Time: 10/03/2022  6:46 PM   Specimen: Anterior Nasal Swab  Result Value Ref Range Status   SARS Coronavirus 2 by RT PCR NEGATIVE NEGATIVE Final    Comment: (NOTE) SARS-CoV-2 target nucleic acids are NOT DETECTED.  The SARS-CoV-2 RNA is generally detectable in upper respiratory specimens during the acute phase of infection. The lowest concentration of SARS-CoV-2 viral copies this assay can detect is 138 copies/mL. A negative result does not preclude SARS-Cov-2 infection and should not be used as the sole basis for treatment or other patient management decisions. A negative result may occur with  improper specimen collection/handling, submission of specimen other than nasopharyngeal swab, presence of viral mutation(s) within the areas targeted by this assay, and inadequate number of viral copies(<138 copies/mL). A negative result must be combined with clinical observations, patient history, and epidemiological information. The expected result is Negative.  Fact Sheet for Patients:  EntrepreneurPulse.com.au  Fact Sheet  for  Healthcare Providers:  IncredibleEmployment.be  This test is no t yet approved or cleared by the [place] and  has been authorized for detection and/or diagnosis of SARS-CoV-2 by FDA under an Emergency Use Authorization (EUA). This EUA will remain  in effect (meaning this test can be used) for the duration of the COVID-19 declaration under Section 564(b)(1) of the Act, 21 U.S.C.section 360bbb-3(b)(1), unless the authorization is terminated  or revoked sooner.       Influenza A by PCR NEGATIVE NEGATIVE Final   Influenza B by PCR NEGATIVE NEGATIVE Final    Comment: (NOTE) The Xpert Xpress SARS-CoV-2/FLU/RSV plus assay is intended as an aid in the diagnosis of influenza from Nasopharyngeal swab specimens and should not be used as a sole basis for treatment. Nasal washings and aspirates are unacceptable for Xpert Xpress SARS-CoV-2/FLU/RSV testing.  Fact Sheet for Patients: EntrepreneurPulse.com.au  Fact Sheet for Healthcare Providers: IncredibleEmployment.be  This test is not yet approved or cleared by the [place] FDA and has been authorized for detection and/or diagnosis of SARS-CoV-2 by FDA under an Emergency Use Authorization (EUA). This EUA will remain in effect (meaning this test can be used) for the duration of the COVID-19 declaration under Section 564(b)(1) of the Act, 21 U.S.C. section 360bbb-3(b)(1), unless the authorization is terminated or revoked.     Resp Syncytial Virus by PCR NEGATIVE NEGATIVE Final    Comment: (NOTE) Fact Sheet for Patients: EntrepreneurPulse.com.au  Fact Sheet for Healthcare Providers: IncredibleEmployment.be  This test is not yet approved or cleared by the [place] FDA and has been authorized for detection and/or diagnosis of SARS-CoV-2 by FDA under an Emergency Use Authorization (EUA). This EUA will remain in effect (meaning this  test can be used) for the duration of the COVID-19 declaration under Section 564(b)(1) of the Act, 21 U.S.C. section 360bbb-3(b)(1), unless the authorization is terminated or revoked.  Performed at [facility], [place] [address], [place], [place] [address]   MRSA Next Gen by PCR, Nasal     Status: None   Collection Time: 10/09/22  1:36 PM   Specimen: Nasal Mucosa; Nasal Swab  Result Value Ref Range Status   MRSA by PCR Next Gen NOT DETECTED NOT DETECTED Final    Comment: (NOTE) The GeneXpert MRSA Assay (FDA approved for NASAL specimens only), is one component of a comprehensive MRSA colonization surveillance program. It is not intended to diagnose MRSA infection nor to guide or monitor treatment for MRSA infections. Test performance is not FDA approved in patients less than 64 years old. Performed at [facility], [place] [address], [place], [place] [address]     RADIOLOGY STUDIES/RESULTS: No results found.   LOS: 4 days   Oren Binet, MD  [facility]    To contact the attending provider between 7A-7P or the covering provider during after hours 7P-7A, please log into the web site [email or web address] and access using universal [place] password for that web site. If you do not have the password, please call the hospital operator.  10/12/2022, 10:07 AM

## 2022-10-12 NOTE — Progress Notes (Signed)
   10/12/22 1202  Vitals  Temp 98.2 F (36.8 C)  Pulse Rate 100  SpO2 97 %  O2 Device Room Air  Post Treatment  Duration of HD Treatment -hour(s) 3.5 hour(s)  Liters Processed 84  Fluid Removed (mL) 2.5 mL   Received patient in bed to unit.  Alert and oriented.  Informed consent signed and in chart.    Patient tolerated well.  Transported back to the room  Alert, without acute distress.  Hand-off given to patient's nurse.   Access used: [facility] Access issues: none  Total UF removed: 2.5L Medication(s) given: none   Na'Shaminy T [facility]

## 2022-10-12 NOTE — Progress Notes (Signed)
[facility] Progress Note   Assessment/ Plan:   Dialysis Orders:  [facility] - per last admission  4hr 400/500 EDW 74.5 kg   2/2.5 Elisio Heparin 3K bolus, Hectorol 61mg   Assessment/Plan: Acute hypoxic respiratory failure/volume overload - Well over dry weight by weights here.  Improved with dialysis.   Bilateral pleural effusions - IR consult for thoracentesis- holding off for now.  ESRD -  HD TTS. Had full treatment Tuesday. Extra UF 1/17, 1/18.  Next HD 1/20   Hypertension/volume  - Bps lowish at start of treatment. UF as able  Anemia  - Hb above goal. No ESA needs currently  Metabolic bone disease -  Ca/Phos ok. Continue home meds.  Nutrition - Renal diet with fluid restriction HFrEF: EF 15-20%  Subjective:    Seen in room.  Doing better.  Eating breakfast.  For HD today   Objective:   BP 104/81 (BP Location: Right Arm)   Pulse 92   Temp 98.9 F (37.2 C) (Oral)   Resp (!) 22   Ht '5\' 6"'$  (1.676 m)   Wt 75.8 kg   SpO2 97%   BMI 26.97 kg/m   Physical Exam: [id number] O2, appears more comfortable CVS: RRR Resp: normal WOB now Abd: soft Ext: 1+ LE edema, improved ACCESS: R IJ TDC  Labs: BMET Recent Labs  Lab 10/17/2022 1744 10/07/2022 1804 09/26/2022 2128 09/29/2022 2240 10/09/22 0410 10/10/22 0921 10/11/22 0146  NA 128* 129* 130* 131* 129* 128* 131*  K 5.5* 3.6 4.0 4.1 4.0 4.2 4.4  CL 92*  --  88* 92* 89* 90* 91*  CO2  --   --  '28 25 27 23 25  '$ GLUCOSE 88  --  82 83 141* 221* 145*  BUN 20  --  '15 16 19 '$ 35* 29*  CREATININE 3.40*  --  3.50* 3.47* 3.94* 5.23* 3.64*  CALCIUM  --   --  7.9* 7.7* 7.7* 7.7* 8.1*  PHOS  --   --   --  2.5 2.7 4.1 3.4   CBC Recent Labs  Lab 09/23/2022 1747 10/22/2022 1804 10/09/22 0410 10/10/22 0921 10/11/22 0146 10/12/22 0108  WBC 5.5   < > 6.4 5.9 9.1 10.8*  NEUTROABS 4.0  --   --   --   --   --   HGB 13.1   < > 12.1* 11.4* 12.2* 12.5*  HCT 38.4*   < > 36.2* 34.9* 36.8* 38.6*  MCV 97.5   < > 97.8  99.1 97.9 99.2  PLT 201   < > 134* 134* 147* 115*   < > = values in this interval not displayed.      Medications:     atorvastatin  40 mg Oral QHS   Chlorhexidine Gluconate Cloth  6 each Topical Q0600   heparin  5,000 Units Subcutaneous Q8H   hydrocerin   Topical TID   ipratropium-albuterol  3 mL Nebulization Q6H   predniSONE  40 mg Oral Q breakfast     EMadelon LipsMD 10/12/2022, 8:48 AM

## 2022-10-13 DIAGNOSIS — J189 Pneumonia, unspecified organism: Secondary | ICD-10-CM | POA: Diagnosis not present

## 2022-10-13 DIAGNOSIS — J90 Pleural effusion, not elsewhere classified: Secondary | ICD-10-CM | POA: Diagnosis not present

## 2022-10-13 DIAGNOSIS — N186 End stage renal disease: Secondary | ICD-10-CM | POA: Diagnosis not present

## 2022-10-13 DIAGNOSIS — R0603 Acute respiratory distress: Secondary | ICD-10-CM | POA: Diagnosis not present

## 2022-10-13 LAB — RENAL FUNCTION PANEL
Albumin: 3.2 g/dL — ABNORMAL LOW (ref 3.5–5.0)
Anion gap: 14 (ref 5–15)
BUN: 34 mg/dL — ABNORMAL HIGH (ref 8–23)
CO2: 22 mmol/L (ref 22–32)
Calcium: 8.1 mg/dL — ABNORMAL LOW (ref 8.9–10.3)
Chloride: 93 mmol/L — ABNORMAL LOW (ref 98–111)
Creatinine, Ser: 3.7 mg/dL — ABNORMAL HIGH (ref 0.61–1.24)
GFR, Estimated: 16 mL/min — ABNORMAL LOW (ref >60)
Glucose, Bld: 208 mg/dL — ABNORMAL HIGH (ref 70–99)
Phosphorus: 2.4 mg/dL — ABNORMAL LOW (ref 2.5–4.6)
Potassium: 4.1 mmol/L (ref 3.5–5.1)
Sodium: 129 mmol/L — ABNORMAL LOW (ref 135–145)

## 2022-10-13 MED ORDER — WHITE PETROLATUM EX OINT
TOPICAL_OINTMENT | CUTANEOUS | Status: DC | PRN
  Administered 2022-10-13 (×2): 0.2 via TOPICAL
  Filled 2022-10-13: qty 28.35

## 2022-10-13 NOTE — Progress Notes (Signed)
[facility] Progress Note   Assessment/ Plan:   Dialysis Orders:  [facility] - per last admission  4hr 400/500 EDW 74.5 kg   2/2.5 Elisio Heparin 3K bolus, Hectorol 48mg   Assessment/Plan: Acute hypoxic respiratory failure/volume overload - Well over dry weight by weights here.  Improved with dialysis.   Bilateral pleural effusions - IR consult for thoracentesis- holding off for now.  ESRD -  HD TTS. Had full treatment Tuesday. Extra UF 1/17, 1/18.  1/20.  Next HD Tuesday Hypertension/volume  - Bps lowish at start of treatment. UF as able  Anemia  - Hb above goal. No ESA needs currently  Metabolic bone disease -  Ca/Phos ok. Continue home meds.  Nutrition - Renal diet with fluid restriction HFrEF: EF 15-20%  Subjective:    Seen in room.  Getting a neb.  Tolerated HD well yesterday   Objective:   BP (!) 115/91 (BP Location: Right Arm)   Pulse 99   Temp 97.8 F (36.6 C) (Oral)   Resp 20   Ht '5\' 6"'$  (1.676 m)   Wt 73.9 kg   SpO2 95%   BMI 26.31 kg/m   Physical Exam: GJIR:CVELFYBNC O2, appears more comfortable CVS: RRR Resp: normal WOB now Abd: soft Ext: trace LE edema, improved ACCESS: R IJ TDC  Labs: BMET Recent Labs  Lab 10/05/2022 2128 09/26/2022 2240 10/09/22 0410 10/10/22 0921 10/11/22 0146 10/12/22 0840 10/13/22 0112  NA 130* 131* 129* 128* 131* 128* 129*  K 4.0 4.1 4.0 4.2 4.4 3.6 4.1  CL 88* 92* 89* 90* 91* 89* 93*  CO2 '28 25 27 23 25 25 22  '$ GLUCOSE 82 83 141* 221* 145* 188* 208*  BUN '15 16 19 '$ 35* 29* 51* 34*  CREATININE 3.50* 3.47* 3.94* 5.23* 3.64* 4.82* 3.70*  CALCIUM 7.9* 7.7* 7.7* 7.7* 8.1* 7.8* 8.1*  PHOS  --  2.5 2.7 4.1 3.4 3.5 2.4*   CBC Recent Labs  Lab 10/09/2022 1747 10/16/2022 1804 10/09/22 0410 10/10/22 0921 10/11/22 0146 10/12/22 0108  WBC 5.5   < > 6.4 5.9 9.1 10.8*  NEUTROABS 4.0  --   --   --   --   --   HGB 13.1   < > 12.1* 11.4* 12.2* 12.5*  HCT 38.4*   < > 36.2* 34.9* 36.8* 38.6*  MCV 97.5   < >  97.8 99.1 97.9 99.2  PLT 201   < > 134* 134* 147* 115*   < > = values in this interval not displayed.      Medications:     arformoterol  15 mcg Nebulization BID   atorvastatin  40 mg Oral QHS   budesonide (PULMICORT) nebulizer solution  0.25 mg Nebulization BID   Chlorhexidine Gluconate Cloth  6 each Topical Q0600   heparin  5,000 Units Subcutaneous Q8H   hydrocerin   Topical TID   ipratropium-albuterol  3 mL Nebulization TID   predniSONE  40 mg Oral Q breakfast   revefenacin  175 mcg Nebulization Daily     EMadelon LipsMD 10/13/2022, 10:51 AM

## 2022-10-13 NOTE — Progress Notes (Signed)
   10/13/22 1000  Mobility  Activity Transferred from bed to chair  Level of Assistance Standby assist, set-up cues, supervision of patient - no hands on  Assistive Device Four wheel walker  Distance Ambulated (ft) 5 ft  Activity Response Tolerated well  Mobility Referral Yes  $Mobility charge 1 Mobility   Mobility Specialist Progress Note  Pt was in bed and agreeable. Had no c/o pain throughout ambulation. Left in chair w/ all needs met and call bell in reach.   Lucious Groves Mobility Specialist  Please contact via SecureChat or Rehab office at [phone]

## 2022-10-13 NOTE — Progress Notes (Signed)
PROGRESS NOTE        PATIENT DETAILS Name: [name]. Age: 82 y.o. Sex: male Date of Birth: 1941/07/20 Admit Date: 10/17/2022 Admitting Physician Kayleen Memos, DO HCW:[id number], [place], MD  Brief Summary: Patient is a 82 y.o.  male with history of ESRD on HD TTS, HFrEF, COPD, HTN, HLD who presented with SOB x 1 day.  Patient was found to have acute hypoxic respiratory failure-requiring BiPAP on initial presentation.  Significant events: 1/16>> SOB-hypoxia-requiring BiPAP.  Last HD on 1/16 as an outpatient.  Significant studies: 1/16>> CXR: Moderate right pleural effusion-right basilar consolidation.  Significant microbiology data: 1/16>> COVID/influenza/RSV PCR: Negative  Procedures: None  Consults: Nephrology  Subjective: No major issues overnight-on room air.  Cracked skin in the bilateral feet area are much better.  Objective: Vitals: Blood pressure (!) 115/91, pulse 99, temperature 97.8 F (36.6 C), temperature source Oral, resp. rate 20, height '5\' 6"'$  (1.676 m), weight 73.9 kg, SpO2 98 %.   Exam: Gen Exam:Alert awake-not in any distress HEENT:atraumatic, normocephalic Chest: B/L clear to auscultation anteriorly CVS:S1S2 regular Abdomen:soft non tender, non distended Extremities:no edema Neurology: Non focal Skin: no rash  Pertinent Labs/Radiology:    Latest Ref Rng & Units 10/12/2022    1:08 AM 10/11/2022    1:46 AM 10/10/2022    9:21 AM  CBC  WBC 4.0 - 10.5 K/uL 10.8  9.1  5.9   Hemoglobin 13.0 - 17.0 g/dL 12.5  12.2  11.4   Hematocrit 39.0 - 52.0 % 38.6  36.8  34.9   Platelets 150 - 400 K/uL 115  147  134     Lab Results  Component Value Date   NA 129 (L) 10/13/2022   K 4.1 10/13/2022   CL 93 (L) 10/13/2022   CO2 22 10/13/2022      Assessment/Plan: Acute hypoxic respiratory failure-multifactorial etiology-right lobar pneumonia/pleural effusion/COPD exacerbation and some amount of volume overload in the setting of  ESRD  Overall much better after treatment of underlying etiologies Continue steroid taper Pleated a course of Rocephin/Zithromax Volume status much better after hemodialysis Lungs are clear-remains on tapering steroids/bronchodilators.  Chronic right pleural effusion Reviewed chart-prior notes-appears to have a chronic right pleural effusion-that has been tapped in the past.  Last thoracocentesis November 2023-was a transudate.   Given overall improvement-and clinical scenario-he is unlikely to have a parapneumonic effusion. No plans to proceed with thoracocentesis-monitor clinically for now.   Acute on chronic HFrEF Volume overload Although no obvious volume overload-Per nephrology-he was beyond his dry weight on initial presentation Hemodialysis being managed by nephrology service.  ESRD on HD TTS Nephrology now following.  Hyponatremia Mild-asymptomatic Should improve with ongoing HD.  Hypomagnesemia Repleted  HTN BP currently soft in the low 151V systolic Metoprolol/losartan on hold-resume over the next few days  HLD Statin  Thrombocytopenia Mild Seems to be a chronic issue ongoing for the past several years. Repeat CBC periodically.  Desquamation of skin of bilateral lower extremities eucerin ointment Overall much improved over the past several days.  BMI: Estimated body mass index is 26.31 kg/m as calculated from the following:   Height as of this encounter: '5\' 6"'$  (1.676 m).   Weight as of this encounter: 73.9 kg.   Code status:   Code Status: Full Code   DVT Prophylaxis: heparin injection 5,000 Units Start: 10/12/2022 2200  Family Communication: None at bedside   Disposition Plan: Status is: Inpatient Remains inpatient appropriate because: Severity of illness.   Planned Discharge Destination:SNF in the next 1-2 days.   Diet: Diet Order             Diet renal with fluid restriction Fluid restriction: 1200 mL Fluid; Room service appropriate?  Yes; Fluid consistency: Thin  Diet effective now                     Antimicrobial agents: Anti-infectives (From admission, onward)    Start     Dose/Rate Route Frequency Ordered Stop   10/12/22 1700  azithromycin (ZITHROMAX) tablet 500 mg        500 mg Oral  Once 10/12/22 1358 10/12/22 1520   10/10/22 1700  azithromycin (ZITHROMAX) 500 mg in sodium chloride 0.9 % 250 mL IVPB  Status:  Discontinued        500 mg 250 mL/hr over 60 Minutes Intravenous Every 24 hours 10/10/22 1429 10/12/22 1358   10/10/22 1200  vancomycin (VANCOREADY) IVPB 750 mg/150 mL  Status:  Discontinued        750 mg 150 mL/hr over 60 Minutes Intravenous Every T-Th-Sa (Hemodialysis) 09/28/2022 2036 10/10/22 0650   10/10/22 0800  cefTRIAXone (ROCEPHIN) 2 g in sodium chloride 0.9 % 100 mL IVPB        2 g 200 mL/hr over 30 Minutes Intravenous Every 24 hours 10/10/22 0650 10/13/22 0849   10/10/22 0800  azithromycin (ZITHROMAX) 500 mg in sodium chloride 0.9 % 250 mL IVPB  Status:  Discontinued        500 mg 250 mL/hr over 60 Minutes Intravenous Every 24 hours 10/10/22 0650 10/10/22 1431   10/09/22 1300  vancomycin (VANCOREADY) IVPB 750 mg/150 mL        750 mg 150 mL/hr over 60 Minutes Intravenous  Once 10/09/22 1219 10/09/22 1434   10/09/2022 2045  ceFEPIme (MAXIPIME) 1 g in sodium chloride 0.9 % 100 mL IVPB  Status:  Discontinued        1 g 200 mL/hr over 30 Minutes Intravenous Daily at bedtime 10/10/2022 2036 10/10/22 0650   10/18/2022 2045  vancomycin (VANCOREADY) IVPB 1500 mg/300 mL        1,500 mg 150 mL/hr over 120 Minutes Intravenous NOW 10/14/2022 2036 10/09/22 0058        MEDICATIONS: Scheduled Meds:  arformoterol  15 mcg Nebulization BID   atorvastatin  40 mg Oral QHS   budesonide (PULMICORT) nebulizer solution  0.25 mg Nebulization BID   Chlorhexidine Gluconate Cloth  6 each Topical Q0600   heparin  5,000 Units Subcutaneous Q8H   hydrocerin   Topical TID   ipratropium-albuterol  3 mL Nebulization TID    predniSONE  40 mg Oral Q breakfast   revefenacin  175 mcg Nebulization Daily   Continuous Infusions:   PRN Meds:.albuterol, white petrolatum   I have personally reviewed following labs and imaging studies  LABORATORY DATA: CBC: Recent Labs  Lab 10/07/2022 1747 10/02/2022 1804 09/23/2022 2240 10/09/22 0410 10/10/22 0921 10/11/22 0146 10/12/22 0108  WBC 5.5  --  5.4 6.4 5.9 9.1 10.8*  NEUTROABS 4.0  --   --   --   --   --   --   HGB 13.1   < > 12.7* 12.1* 11.4* 12.2* 12.5*  HCT 38.4*   < > 37.7* 36.2* 34.9* 36.8* 38.6*  MCV 97.5  --  97.4 97.8 99.1 97.9 99.2  PLT  201  --  129* 134* 134* 147* 115*   < > = values in this interval not displayed.     Basic Metabolic Panel: Recent Labs  Lab 10/09/22 0410 10/10/22 0921 10/11/22 0146 10/12/22 0840 10/13/22 0112  NA 129* 128* 131* 128* 129*  K 4.0 4.2 4.4 3.6 4.1  CL 89* 90* 91* 89* 93*  CO2 '27 23 25 25 22  '$ GLUCOSE 141* 221* 145* 188* 208*  BUN 19 35* 29* 51* 34*  CREATININE 3.94* 5.23* 3.64* 4.82* 3.70*  CALCIUM 7.7* 7.7* 8.1* 7.8* 8.1*  MG 1.6* 2.2 1.8  --   --   PHOS 2.7 4.1 3.4 3.5 2.4*     GFR: Estimated Creatinine Clearance: 14.4 mL/min (A) (by C-G formula based on SCr of 3.7 mg/dL (H)).  Liver Function Tests: Recent Labs  Lab 10/09/22 0410 10/10/22 0921 10/11/22 0146 10/12/22 0840 10/13/22 0112  AST 27  --   --   --   --   ALT 11  --   --   --   --   ALKPHOS 63  --   --   --   --   BILITOT 1.2  --   --   --   --   PROT 7.7  --   --   --   --   ALBUMIN 3.4* 3.4* 3.4* 3.2* 3.2*    No results for input(s): "LIPASE", "AMYLASE" in the last 168 hours. No results for input(s): "AMMONIA" in the last 168 hours.  Coagulation Profile: No results for input(s): "INR", "PROTIME" in the last 168 hours.  Cardiac Enzymes: No results for input(s): "CKTOTAL", "CKMB", "CKMBINDEX", "TROPONINI" in the last 168 hours.  BNP (last 3 results) No results for input(s): "PROBNP" in the last 8760 hours.  Lipid  Profile: No results for input(s): "CHOL", "HDL", "LDLCALC", "TRIG", "CHOLHDL", "LDLDIRECT" in the last 72 hours.  Thyroid Function Tests: No results for input(s): "TSH", "T4TOTAL", "FREET4", "T3FREE", "THYROIDAB" in the last 72 hours.  Anemia Panel: No results for input(s): "VITAMINB12", "FOLATE", "FERRITIN", "TIBC", "IRON", "RETICCTPCT" in the last 72 hours.  Urine analysis:    Component Value Date/Time   COLORURINE YELLOW 03/15/2019 [place] 03/15/2019 1644   LABSPEC 1.013 03/15/2019 1644   PHURINE 5.0 03/15/2019 1644   GLUCOSEU 50 (A) 03/15/2019 1644   HGBUR SMALL (A) 03/15/2019 1644   BILIRUBINUR NEGATIVE 03/15/2019 1644   KETONESUR NEGATIVE 03/15/2019 1644   PROTEINUR >=300 (A) 03/15/2019 1644   NITRITE NEGATIVE 03/15/2019 1644   LEUKOCYTESUR NEGATIVE 03/15/2019 1644    Sepsis Labs: Lactic Acid, Venous    Component Value Date/Time   LATICACIDVEN 1.6 06/25/2021 1707    MICROBIOLOGY: Recent Results (from the past 240 hour(s))  Resp panel by RT-PCR (RSV, Flu A&B, Covid) Anterior Nasal Swab     Status: None   Collection Time: 10/20/2022  6:46 PM   Specimen: Anterior Nasal Swab  Result Value Ref Range Status   SARS Coronavirus 2 by RT PCR NEGATIVE NEGATIVE Final    Comment: (NOTE) SARS-CoV-2 target nucleic acids are NOT DETECTED.  The SARS-CoV-2 RNA is generally detectable in upper respiratory specimens during the acute phase of infection. The lowest concentration of SARS-CoV-2 viral copies this assay can detect is 138 copies/mL. A negative result does not preclude SARS-Cov-2 infection and should not be used as the sole basis for treatment or other patient management decisions. A negative result may occur with  improper specimen collection/handling, submission of specimen other than  nasopharyngeal swab, presence of viral mutation(s) within the areas targeted by this assay, and inadequate number of viral copies(<138 copies/mL). A negative result must  be combined with clinical observations, patient history, and epidemiological information. The expected result is Negative.  Fact Sheet for Patients:  EntrepreneurPulse.com.au  Fact Sheet for Healthcare Providers:  IncredibleEmployment.be  This test is no t yet approved or cleared by the [place] FDA and  has been authorized for detection and/or diagnosis of SARS-CoV-2 by FDA under an Emergency Use Authorization (EUA). This EUA will remain  in effect (meaning this test can be used) for the duration of the COVID-19 declaration under Section 564(b)(1) of the Act, 21 U.S.C.section 360bbb-3(b)(1), unless the authorization is terminated  or revoked sooner.       Influenza A by PCR NEGATIVE NEGATIVE Final   Influenza B by PCR NEGATIVE NEGATIVE Final    Comment: (NOTE) The Xpert Xpress SARS-CoV-2/FLU/RSV plus assay is intended as an aid in the diagnosis of influenza from Nasopharyngeal swab specimens and should not be used as a sole basis for treatment. Nasal washings and aspirates are unacceptable for Xpert Xpress SARS-CoV-2/FLU/RSV testing.  Fact Sheet for Patients: EntrepreneurPulse.com.au  Fact Sheet for Healthcare Providers: IncredibleEmployment.be  This test is not yet approved or cleared by the [place] FDA and has been authorized for detection and/or diagnosis of SARS-CoV-2 by FDA under an Emergency Use Authorization (EUA). This EUA will remain in effect (meaning this test can be used) for the duration of the COVID-19 declaration under Section 564(b)(1) of the Act, 21 U.S.C. section 360bbb-3(b)(1), unless the authorization is terminated or revoked.     Resp Syncytial Virus by PCR NEGATIVE NEGATIVE Final    Comment: (NOTE) Fact Sheet for Patients: EntrepreneurPulse.com.au  Fact Sheet for Healthcare Providers: IncredibleEmployment.be  This test is not  yet approved or cleared by the [place] FDA and has been authorized for detection and/or diagnosis of SARS-CoV-2 by FDA under an Emergency Use Authorization (EUA). This EUA will remain in effect (meaning this test can be used) for the duration of the COVID-19 declaration under Section 564(b)(1) of the Act, 21 U.S.C. section 360bbb-3(b)(1), unless the authorization is terminated or revoked.  Performed at [facility], [place] [address] [place], [place] [address]   MRSA Next Gen by PCR, Nasal     Status: None   Collection Time: 10/09/22  1:36 PM   Specimen: Nasal Mucosa; Nasal Swab  Result Value Ref Range Status   MRSA by PCR Next Gen NOT DETECTED NOT DETECTED Final    Comment: (NOTE) The GeneXpert MRSA Assay (FDA approved for NASAL specimens only), is one component of a comprehensive MRSA colonization surveillance program. It is not intended to diagnose MRSA infection nor to guide or monitor treatment for MRSA infections. Test performance is not FDA approved in patients less than 56 years old. Performed at [facility], [place] [address] [place], [place] [address]     RADIOLOGY STUDIES/RESULTS: No results found.   LOS: 5 days   Oren Binet, MD  [facility]    To contact the attending provider between 7A-7P or the covering provider during after hours 7P-7A, please log into the web site [email or web address] and access using universal [place] password for that web site. If you do not have the password, please call the hospital operator.  10/13/2022, 8:53 AM

## 2022-10-13 NOTE — TOC Progression Note (Signed)
Transition of Care (TOC) - Progression Note    Patient Details  Name: [name]. MRN: [id number] Date of Birth: 1940-10-08  Transition of Care [facility]) CM/SW [place], LCSW Phone Number: 10/13/2022, 2:25 PM  Clinical Narrative:    PT currently does not have any bed offers.  TOC team will continue to assist with discharge planning needs.    Expected Discharge Plan: [place] Barriers to Discharge: Continued Medical Work up  Expected Discharge Plan and Services       Living arrangements for the past 2 months: Apartment                                       Social Determinants of Health (SDOH) Interventions SDOH Screenings   Food Insecurity: No Food Insecurity (10/10/2022)  Housing: Low Risk  (10/10/2022)  Transportation Needs: No Transportation Needs (10/10/2022)  Utilities: Not At Risk (10/10/2022)  Tobacco Use: Medium Risk (10/10/2022)    Readmission Risk Interventions   No data to display

## 2022-10-14 DIAGNOSIS — J90 Pleural effusion, not elsewhere classified: Secondary | ICD-10-CM | POA: Diagnosis not present

## 2022-10-14 DIAGNOSIS — N186 End stage renal disease: Secondary | ICD-10-CM | POA: Diagnosis not present

## 2022-10-14 DIAGNOSIS — R0603 Acute respiratory distress: Secondary | ICD-10-CM | POA: Diagnosis not present

## 2022-10-14 DIAGNOSIS — J189 Pneumonia, unspecified organism: Secondary | ICD-10-CM | POA: Diagnosis not present

## 2022-10-14 LAB — GLUCOSE, CAPILLARY: Glucose-Capillary: 197 mg/dL — ABNORMAL HIGH (ref 70–99)

## 2022-10-14 MED ORDER — CHLORHEXIDINE GLUCONATE CLOTH 2 % EX PADS: 6.0000 | MEDICATED_PAD | Freq: Every day | CUTANEOUS | Status: DC

## 2022-10-14 NOTE — Progress Notes (Signed)
PROGRESS NOTE        PATIENT DETAILS Name: [name]. Age: 82 y.o. Sex: male Date of Birth: 11/02/1940 Admit Date: 09/23/2022 Admitting Physician Kayleen Memos, DO LPF:[id number], Dibas, MD  Brief Summary: Patient is a 82 y.o.  male with history of ESRD on HD TTS, HFrEF, COPD, HTN, HLD who presented with SOB x 1 day.  Patient was found to have acute hypoxic respiratory failure-requiring BiPAP on initial presentation.  Significant events: 1/16>> SOB-hypoxia-requiring BiPAP.  Last HD on 1/16 as an outpatient.  Significant studies: 1/16>> CXR: Moderate right pleural effusion-right basilar consolidation.  Significant microbiology data: 1/16>> COVID/influenza/RSV PCR: Negative  Procedures: None  Consults: Nephrology  Subjective: On room air-eating breakfast.  Foot pain is better.  Objective: Vitals: Blood pressure 110/84, pulse 95, temperature 97.6 F (36.4 C), temperature source Oral, resp. rate 18, height '5\' 6"'$  (1.676 m), weight 74.2 kg, SpO2 94 %.   Exam: Gen Exam:Alert awake-not in any distress HEENT:atraumatic, normocephalic Chest: B/L clear to auscultation anteriorly CVS:S1S2 regular Abdomen:soft non tender, non distended Extremities:no edema Neurology: Non focal Skin: no rash  Pertinent Labs/Radiology:    Latest Ref Rng & Units 10/12/2022    1:08 AM 10/11/2022    1:46 AM 10/10/2022    9:21 AM  CBC  WBC 4.0 - 10.5 K/uL 10.8  9.1  5.9   Hemoglobin 13.0 - 17.0 g/dL 12.5  12.2  11.4   Hematocrit 39.0 - 52.0 % 38.6  36.8  34.9   Platelets 150 - 400 K/uL 115  147  134     Lab Results  Component Value Date   NA 129 (L) 10/13/2022   K 4.1 10/13/2022   CL 93 (L) 10/13/2022   CO2 22 10/13/2022      Assessment/Plan: Acute hypoxic respiratory failure-multifactorial etiology-right lobar pneumonia/pleural effusion/COPD exacerbation and some amount of volume overload in the setting of ESRD  Overall much better after treatment of  underlying etiologies Continue steroid taper Completed a course of Rocephin/Zithromax. Volume status much better after hemodialysis Lungs are clear-remains on tapering steroids/bronchodilators.  Chronic right pleural effusion Reviewed chart-prior notes-appears to have a chronic right pleural effusion-that has been tapped in the past.   Last thoracocentesis November 2023-was a transudate.   Given overall improvement-and clinical scenario-he is unlikely to have a parapneumonic effusion. No plans to proceed with thoracocentesis-monitor clinically for now.   Acute on chronic HFrEF Volume overload Although no obvious volume overload-Per nephrology-he was beyond his dry weight on initial presentation Hemodialysis being managed by nephrology service.  ESRD on HD TTS Nephrology now following.  Hyponatremia Mild-asymptomatic Should improve with ongoing HD.  Hypomagnesemia Repleted  HTN BP currently soft in the low 735H systolic Metoprolol/losartan on hold-resume over the next few days  HLD Statin  Thrombocytopenia Mild Seems to be a chronic issue ongoing for the past several years. Repeat CBC periodically.  Desquamation of skin of bilateral lower extremities eucerin ointment Overall much improved over the past several days.  BMI: Estimated body mass index is 26.4 kg/m as calculated from the following:   Height as of this encounter: '5\' 6"'$  (1.676 m).   Weight as of this encounter: 74.2 kg.   Code status:   Code Status: Full Code   DVT Prophylaxis: heparin injection 5,000 Units Start: 10/16/2022 2200   Family Communication: None at bedside  Disposition Plan: Status is: Inpatient Remains inpatient appropriate because: Severity of illness.   Planned Discharge Destination:SNF when bed available.  Medically stable for transfer.   Diet: Diet Order             Diet renal with fluid restriction Fluid restriction: 1200 mL Fluid; Room service appropriate? Yes; Fluid  consistency: Thin  Diet effective now                     Antimicrobial agents: Anti-infectives (From admission, onward)    Start     Dose/Rate Route Frequency Ordered Stop   10/12/22 1700  azithromycin (ZITHROMAX) tablet 500 mg        500 mg Oral  Once 10/12/22 1358 10/12/22 1520   10/10/22 1700  azithromycin (ZITHROMAX) 500 mg in sodium chloride 0.9 % 250 mL IVPB  Status:  Discontinued        500 mg 250 mL/hr over 60 Minutes Intravenous Every 24 hours 10/10/22 1429 10/12/22 1358   10/10/22 1200  vancomycin (VANCOREADY) IVPB 750 mg/150 mL  Status:  Discontinued        750 mg 150 mL/hr over 60 Minutes Intravenous Every T-Th-Sa (Hemodialysis) 09/25/2022 2036 10/10/22 0650   10/10/22 0800  cefTRIAXone (ROCEPHIN) 2 g in sodium chloride 0.9 % 100 mL IVPB        2 g 200 mL/hr over 30 Minutes Intravenous Every 24 hours 10/10/22 0650 10/13/22 0925   10/10/22 0800  azithromycin (ZITHROMAX) 500 mg in sodium chloride 0.9 % 250 mL IVPB  Status:  Discontinued        500 mg 250 mL/hr over 60 Minutes Intravenous Every 24 hours 10/10/22 0650 10/10/22 1431   10/09/22 1300  vancomycin (VANCOREADY) IVPB 750 mg/150 mL        750 mg 150 mL/hr over 60 Minutes Intravenous  Once 10/09/22 1219 10/09/22 1434   09/28/2022 2045  ceFEPIme (MAXIPIME) 1 g in sodium chloride 0.9 % 100 mL IVPB  Status:  Discontinued        1 g 200 mL/hr over 30 Minutes Intravenous Daily at bedtime 10/22/2022 2036 10/10/22 0650   10/09/2022 2045  vancomycin (VANCOREADY) IVPB 1500 mg/300 mL        1,500 mg 150 mL/hr over 120 Minutes Intravenous NOW 10/06/2022 2036 10/09/22 0058        MEDICATIONS: Scheduled Meds:  arformoterol  15 mcg Nebulization BID   atorvastatin  40 mg Oral QHS   budesonide (PULMICORT) nebulizer solution  0.25 mg Nebulization BID   Chlorhexidine Gluconate Cloth  6 each Topical Q0600   heparin  5,000 Units Subcutaneous Q8H   hydrocerin   Topical TID   ipratropium-albuterol  3 mL Nebulization TID    predniSONE  40 mg Oral Q breakfast   revefenacin  175 mcg Nebulization Daily   Continuous Infusions:   PRN Meds:.albuterol, white petrolatum   I have personally reviewed following labs and imaging studies  LABORATORY DATA: CBC: Recent Labs  Lab 09/28/2022 1747 09/28/2022 1804 10/10/2022 2240 10/09/22 0410 10/10/22 0921 10/11/22 0146 10/12/22 0108  WBC 5.5  --  5.4 6.4 5.9 9.1 10.8*  NEUTROABS 4.0  --   --   --   --   --   --   HGB 13.1   < > 12.7* 12.1* 11.4* 12.2* 12.5*  HCT 38.4*   < > 37.7* 36.2* 34.9* 36.8* 38.6*  MCV 97.5  --  97.4 97.8 99.1 97.9 99.2  PLT 201  --  129* 134* 134* 147* 115*   < > = values in this interval not displayed.     Basic Metabolic Panel: Recent Labs  Lab 10/09/22 0410 10/10/22 0921 10/11/22 0146 10/12/22 0840 10/13/22 0112  NA 129* 128* 131* 128* 129*  K 4.0 4.2 4.4 3.6 4.1  CL 89* 90* 91* 89* 93*  CO2 '27 23 25 25 22  '$ GLUCOSE 141* 221* 145* 188* 208*  BUN 19 35* 29* 51* 34*  CREATININE 3.94* 5.23* 3.64* 4.82* 3.70*  CALCIUM 7.7* 7.7* 8.1* 7.8* 8.1*  MG 1.6* 2.2 1.8  --   --   PHOS 2.7 4.1 3.4 3.5 2.4*     GFR: Estimated Creatinine Clearance: 14.4 mL/min (A) (by C-G formula based on SCr of 3.7 mg/dL (H)).  Liver Function Tests: Recent Labs  Lab 10/09/22 0410 10/10/22 0921 10/11/22 0146 10/12/22 0840 10/13/22 0112  AST 27  --   --   --   --   ALT 11  --   --   --   --   ALKPHOS 63  --   --   --   --   BILITOT 1.2  --   --   --   --   PROT 7.7  --   --   --   --   ALBUMIN 3.4* 3.4* 3.4* 3.2* 3.2*    No results for input(s): "LIPASE", "AMYLASE" in the last 168 hours. No results for input(s): "AMMONIA" in the last 168 hours.  Coagulation Profile: No results for input(s): "INR", "PROTIME" in the last 168 hours.  Cardiac Enzymes: No results for input(s): "CKTOTAL", "CKMB", "CKMBINDEX", "TROPONINI" in the last 168 hours.  BNP (last 3 results) No results for input(s): "PROBNP" in the last 8760 hours.  Lipid  Profile: No results for input(s): "CHOL", "HDL", "LDLCALC", "TRIG", "CHOLHDL", "LDLDIRECT" in the last 72 hours.  Thyroid Function Tests: No results for input(s): "TSH", "T4TOTAL", "FREET4", "T3FREE", "THYROIDAB" in the last 72 hours.  Anemia Panel: No results for input(s): "VITAMINB12", "FOLATE", "FERRITIN", "TIBC", "IRON", "RETICCTPCT" in the last 72 hours.  Urine analysis:    Component Value Date/Time   COLORURINE YELLOW 03/15/2019 [place] 03/15/2019 1644   LABSPEC 1.013 03/15/2019 1644   PHURINE 5.0 03/15/2019 1644   GLUCOSEU 50 (A) 03/15/2019 1644   HGBUR SMALL (A) 03/15/2019 1644   BILIRUBINUR NEGATIVE 03/15/2019 1644   KETONESUR NEGATIVE 03/15/2019 1644   PROTEINUR >=300 (A) 03/15/2019 1644   NITRITE NEGATIVE 03/15/2019 1644   LEUKOCYTESUR NEGATIVE 03/15/2019 1644    Sepsis Labs: Lactic Acid, Venous    Component Value Date/Time   LATICACIDVEN 1.6 06/25/2021 1707    MICROBIOLOGY: Recent Results (from the past 240 hour(s))  Resp panel by RT-PCR (RSV, Flu A&B, Covid) Anterior Nasal Swab     Status: None   Collection Time: 10/12/2022  6:46 PM   Specimen: Anterior Nasal Swab  Result Value Ref Range Status   SARS Coronavirus 2 by RT PCR NEGATIVE NEGATIVE Final    Comment: (NOTE) SARS-CoV-2 target nucleic acids are NOT DETECTED.  The SARS-CoV-2 RNA is generally detectable in upper respiratory specimens during the acute phase of infection. The lowest concentration of SARS-CoV-2 viral copies this assay can detect is 138 copies/mL. A negative result does not preclude SARS-Cov-2 infection and should not be used as the sole basis for treatment or other patient management decisions. A negative result may occur with  improper specimen collection/handling, submission of specimen other than nasopharyngeal swab, presence of  viral mutation(s) within the areas targeted by this assay, and inadequate number of viral copies(<138 copies/mL). A negative result must  be combined with clinical observations, patient history, and epidemiological information. The expected result is Negative.  Fact Sheet for Patients:  EntrepreneurPulse.com.au  Fact Sheet for Healthcare Providers:  IncredibleEmployment.be  This test is no t yet approved or cleared by the [place] FDA and  has been authorized for detection and/or diagnosis of SARS-CoV-2 by FDA under an Emergency Use Authorization (EUA). This EUA will remain  in effect (meaning this test can be used) for the duration of the COVID-19 declaration under Section 564(b)(1) of the Act, 21 U.S.C.section 360bbb-3(b)(1), unless the authorization is terminated  or revoked sooner.       Influenza A by PCR NEGATIVE NEGATIVE Final   Influenza B by PCR NEGATIVE NEGATIVE Final    Comment: (NOTE) The Xpert Xpress SARS-CoV-2/FLU/RSV plus assay is intended as an aid in the diagnosis of influenza from Nasopharyngeal swab specimens and should not be used as a sole basis for treatment. Nasal washings and aspirates are unacceptable for Xpert Xpress SARS-CoV-2/FLU/RSV testing.  Fact Sheet for Patients: EntrepreneurPulse.com.au  Fact Sheet for Healthcare Providers: IncredibleEmployment.be  This test is not yet approved or cleared by the [place] FDA and has been authorized for detection and/or diagnosis of SARS-CoV-2 by FDA under an Emergency Use Authorization (EUA). This EUA will remain in effect (meaning this test can be used) for the duration of the COVID-19 declaration under Section 564(b)(1) of the Act, 21 U.S.C. section 360bbb-3(b)(1), unless the authorization is terminated or revoked.     Resp Syncytial Virus by PCR NEGATIVE NEGATIVE Final    Comment: (NOTE) Fact Sheet for Patients: EntrepreneurPulse.com.au  Fact Sheet for Healthcare Providers: IncredibleEmployment.be  This test is not  yet approved or cleared by the [place] FDA and has been authorized for detection and/or diagnosis of SARS-CoV-2 by FDA under an Emergency Use Authorization (EUA). This EUA will remain in effect (meaning this test can be used) for the duration of the COVID-19 declaration under Section 564(b)(1) of the Act, 21 U.S.C. section 360bbb-3(b)(1), unless the authorization is terminated or revoked.  Performed at [facility], [place] [address], [place], [place] [address]   MRSA Next Gen by PCR, Nasal     Status: None   Collection Time: 10/09/22  1:36 PM   Specimen: Nasal Mucosa; Nasal Swab  Result Value Ref Range Status   MRSA by PCR Next Gen NOT DETECTED NOT DETECTED Final    Comment: (NOTE) The GeneXpert MRSA Assay (FDA approved for NASAL specimens only), is one component of a comprehensive MRSA colonization surveillance program. It is not intended to diagnose MRSA infection nor to guide or monitor treatment for MRSA infections. Test performance is not FDA approved in patients less than 39 years old. Performed at [facility], [place] [address] [place], [place] [address]     RADIOLOGY STUDIES/RESULTS: No results found.   LOS: 6 days   Oren Binet, MD  [facility]    To contact the attending provider between 7A-7P or the covering provider during after hours 7P-7A, please log into the web site [email or web address] and access using universal [place] password for that web site. If you do not have the password, please call the hospital operator.  10/14/2022, 9:33 AM

## 2022-10-14 NOTE — Care Management Important Message (Signed)
Important Message  Patient Details  Name: [name]. MRN: [id number] Date of Birth: 10/20/1940   Medicare Important Message Given:  Yes     Shelda Altes 10/14/2022, 10:42 AM

## 2022-10-14 NOTE — Progress Notes (Addendum)
[facility] Progress Note   Assessment/ Plan:   Dialysis Orders:  [facility] - per last admission  4hr 400/500   74.5 kg   2/2.5   Heparin 3K bolus - hectorol 36mg   Assessment/Plan: Acute hypoxic respiratory failure/volume overload - Well over dry weight by weights here.  Improved with dialysis, now is at dry wt.    Bilateral pleural effusions - IR consult for thoracentesis- holding off for now.  ESRD -  HD TTS. Had full treatment Tuesday. Extra UF 1/17, 1/18. Last HD 1/20.  Next HD tomorrow.  Hypertension/volume  - Cont to lower dry wt as BP's tolerate Anemia  - Hb above goal. No ESA needs currently  Metabolic bone disease -  Ca/Phos ok. Continue home meds.  Nutrition - Renal diet with fluid restriction HFrEF: EF 15-20%  Subjective:    Seen in room.  No c/o's.   Objective:   BP 102/89 (BP Location: Right Arm)   Pulse 96   Temp 97.6 F (36.4 C) (Oral)   Resp 18   Ht '5\' 6"'$  (1.676 m)   Wt 74.2 kg   SpO2 100%   BMI 26.40 kg/m   Physical Exam: [id number] O2, appears more comfortable CVS: RRR Resp: normal WOB now Abd: soft Ext: trace LE edema, improved ACCESS: R IJ TDC  Labs: BMET Recent Labs  Lab 10/16/2022 2128 10/02/2022 2240 10/09/22 0410 10/10/22 0921 10/11/22 0146 10/12/22 0840 10/13/22 0112  NA 130* 131* 129* 128* 131* 128* 129*  K 4.0 4.1 4.0 4.2 4.4 3.6 4.1  CL 88* 92* 89* 90* 91* 89* 93*  CO2 '28 25 27 23 25 25 22  '$ GLUCOSE 82 83 141* 221* 145* 188* 208*  BUN '15 16 19 '$ 35* 29* 51* 34*  CREATININE 3.50* 3.47* 3.94* 5.23* 3.64* 4.82* 3.70*  CALCIUM 7.9* 7.7* 7.7* 7.7* 8.1* 7.8* 8.1*  PHOS  --  2.5 2.7 4.1 3.4 3.5 2.4*    CBC Recent Labs  Lab 09/23/2022 1747 09/24/2022 1804 10/09/22 0410 10/10/22 0921 10/11/22 0146 10/12/22 0108  WBC 5.5   < > 6.4 5.9 9.1 10.8*  NEUTROABS 4.0  --   --   --   --   --   HGB 13.1   < > 12.1* 11.4* 12.2* 12.5*  HCT 38.4*   < > 36.2* 34.9* 36.8* 38.6*  MCV 97.5   < > 97.8 99.1 97.9 99.2   PLT 201   < > 134* 134* 147* 115*   < > = values in this interval not displayed.       Medications:     arformoterol  15 mcg Nebulization BID   atorvastatin  40 mg Oral QHS   budesonide (PULMICORT) nebulizer solution  0.25 mg Nebulization BID   Chlorhexidine Gluconate Cloth  6 each Topical Q0600   heparin  5,000 Units Subcutaneous Q8H   hydrocerin   Topical TID   ipratropium-albuterol  3 mL Nebulization TID   predniSONE  40 mg Oral Q breakfast   revefenacin  175 mcg Nebulization Daily     EMadelon LipsMD 10/14/2022, 2:19 PM

## 2022-10-15 ENCOUNTER — Inpatient Hospital Stay (HOSPITAL_COMMUNITY): Payer: No Typology Code available for payment source

## 2022-10-15 DIAGNOSIS — N186 End stage renal disease: Secondary | ICD-10-CM | POA: Diagnosis not present

## 2022-10-15 DIAGNOSIS — J189 Pneumonia, unspecified organism: Secondary | ICD-10-CM | POA: Diagnosis not present

## 2022-10-15 DIAGNOSIS — J90 Pleural effusion, not elsewhere classified: Secondary | ICD-10-CM | POA: Diagnosis not present

## 2022-10-15 DIAGNOSIS — R0603 Acute respiratory distress: Secondary | ICD-10-CM | POA: Diagnosis not present

## 2022-10-15 LAB — RENAL FUNCTION PANEL
Albumin: 3.5 g/dL (ref 3.5–5.0)
Anion gap: 13 (ref 5–15)
BUN: 63 mg/dL — ABNORMAL HIGH (ref 8–23)
CO2: 22 mmol/L (ref 22–32)
Calcium: 8.2 mg/dL — ABNORMAL LOW (ref 8.9–10.3)
Chloride: 84 mmol/L — ABNORMAL LOW (ref 98–111)
Creatinine, Ser: 5.98 mg/dL — ABNORMAL HIGH (ref 0.61–1.24)
GFR, Estimated: 9 mL/min — ABNORMAL LOW (ref >60)
Glucose, Bld: 120 mg/dL — ABNORMAL HIGH (ref 70–99)
Phosphorus: 4.7 mg/dL — ABNORMAL HIGH (ref 2.5–4.6)
Potassium: 5.3 mmol/L — ABNORMAL HIGH (ref 3.5–5.1)
Sodium: 119 mmol/L — CL (ref 135–145)

## 2022-10-15 LAB — BASIC METABOLIC PANEL
Anion gap: 11 (ref 5–15)
BUN: 35 mg/dL — ABNORMAL HIGH (ref 8–23)
CO2: 26 mmol/L (ref 22–32)
Calcium: 8.2 mg/dL — ABNORMAL LOW (ref 8.9–10.3)
Chloride: 89 mmol/L — ABNORMAL LOW (ref 98–111)
Creatinine, Ser: 3.78 mg/dL — ABNORMAL HIGH (ref 0.61–1.24)
GFR, Estimated: 15 mL/min — ABNORMAL LOW (ref >60)
Glucose, Bld: 99 mg/dL (ref 70–99)
Potassium: 3.9 mmol/L (ref 3.5–5.1)
Sodium: 126 mmol/L — ABNORMAL LOW (ref 135–145)

## 2022-10-15 LAB — CBC
HCT: 36.3 % — ABNORMAL LOW (ref 39.0–52.0)
Hemoglobin: 12.4 g/dL — ABNORMAL LOW (ref 13.0–17.0)
MCH: 32.8 pg (ref 26.0–34.0)
MCHC: 34.2 g/dL (ref 30.0–36.0)
MCV: 96 fL (ref 80.0–100.0)
Platelets: 129 10*3/uL — ABNORMAL LOW (ref 150–400)
RBC: 3.78 MIL/uL — ABNORMAL LOW (ref 4.22–5.81)
RDW: 14.7 % (ref 11.5–15.5)
WBC: 8.8 10*3/uL (ref 4.0–10.5)
nRBC: 0 % (ref 0.0–0.2)

## 2022-10-15 MED ORDER — BUDESONIDE-FORMOTEROL FUMARATE 160-4.5 MCG/ACT IN AERO: 2.0000 | INHALATION_SPRAY | Freq: Two times a day (BID) | RESPIRATORY_TRACT | 12 refills | Status: AC

## 2022-10-15 MED ORDER — WHITE PETROLATUM EX OINT: 1.0000 | TOPICAL_OINTMENT | CUTANEOUS | 0 refills | Status: AC | PRN

## 2022-10-15 MED ORDER — HYDROCERIN EX CREA: 1.0000 | TOPICAL_CREAM | Freq: Three times a day (TID) | CUTANEOUS | 0 refills | Status: AC

## 2022-10-15 MED ORDER — IPRATROPIUM-ALBUTEROL 0.5-2.5 (3) MG/3ML IN SOLN: 3.0000 mL | Freq: Two times a day (BID) | RESPIRATORY_TRACT | Status: DC

## 2022-10-15 MED ORDER — TIOTROPIUM BROMIDE MONOHYDRATE 18 MCG IN CAPS: 18.0000 ug | ORAL_CAPSULE | Freq: Every day | RESPIRATORY_TRACT | 2 refills | Status: AC

## 2022-10-15 MED ORDER — ALBUTEROL SULFATE (2.5 MG/3ML) 0.083% IN NEBU: 2.5000 mg | INHALATION_SOLUTION | RESPIRATORY_TRACT | 12 refills | Status: AC | PRN

## 2022-10-15 NOTE — Progress Notes (Signed)
PROGRESS NOTE        PATIENT DETAILS Name: [name]. Age: 82 y.o. Sex: male Date of Birth: [birth year] Admit Date: 10/22/2022 Admitting Physician Bernard Memos, DO YIF:[id number], [place], Cole  Brief Summary: Patient is a 82 y.o.  male with history of ESRD on HD TTS, HFrEF, COPD, HTN, HLD who presented with SOB x 1 day.  Patient was found to have acute hypoxic respiratory failure-requiring BiPAP on initial presentation.  Significant events: 1/16>> SOB-hypoxia-requiring BiPAP.  Last HD on 1/16 as an outpatient.  Significant studies: 1/16>> CXR: Moderate right pleural effusion-right basilar consolidation.  Significant microbiology data: 1/16>> COVID/influenza/RSV PCR: Negative  Procedures: None  Consults: Nephrology  Subjective: No major issues overnight-on room air.  Lying comfortably in bed.  Awaiting SNF placement.  Objective: Vitals: Blood pressure 113/89, pulse 88, temperature 98.2 F (36.8 C), temperature source Oral, resp. rate 15, height '5\' 6"'$  (1.676 m), weight 78.9 kg, SpO2 93 %.   Exam: Gen Exam:Alert awake-not in any distress HEENT:atraumatic, normocephalic Chest: B/L clear to auscultation anteriorly CVS:S1S2 regular Abdomen:soft non tender, non distended Extremities:no edema Neurology: Non focal Skin: no rash  Pertinent Labs/Radiology:    Latest Ref Rng & Units 10/12/2022    1:08 AM 10/11/2022    1:46 AM 10/10/2022    9:21 AM  CBC  WBC 4.0 - 10.5 K/uL 10.8  9.1  5.9   Hemoglobin 13.0 - 17.0 g/dL 12.5  12.2  11.4   Hematocrit 39.0 - 52.0 % 38.6  36.8  34.9   Platelets 150 - 400 K/uL 115  147  134     Lab Results  Component Value Date   NA 129 (L) 10/13/2022   K 4.1 10/13/2022   CL 93 (L) 10/13/2022   CO2 22 10/13/2022      Assessment/Plan: Acute hypoxic respiratory failure-multifactorial etiology-right lobar pneumonia/pleural effusion/COPD exacerbation and some amount of volume overload in the setting of ESRD   Overall much better after treatment of underlying etiologies Completed steroid taper-completed a course of Rocephin/Zithromax Volume status much better after hemodialysis Lungs are completely clear on exam without any rhonchi or rales this morning.  Chronic right pleural effusion Reviewed chart-prior notes-appears to have a chronic right pleural effusion-that has been tapped in the past.   Last thoracocentesis November 2023-was a transudate.   Given overall improvement-and clinical scenario-he is unlikely to have a parapneumonic effusion. No plans to proceed with thoracocentesis-monitor clinically for now.   Acute on chronic HFrEF Volume overload Although no obvious volume overload-Per nephrology-he was beyond his dry weight on initial presentation Hemodialysis being managed by nephrology service.  ESRD on HD TTS Nephrology now following.  Hyponatremia Mild-asymptomatic Should improve with ongoing HD.  Hypomagnesemia Repleted  HTN BP currently soft in the low 878M systolic Metoprolol/losartan on hold-resume over the next few days  HLD Statin  Thrombocytopenia Mild Seems to be a chronic issue ongoing for the past several years. Repeat CBC periodically.  Desquamation of skin of bilateral lower extremities eucerin ointment Overall much improved over the past several days.  BMI: Estimated body mass index is 28.08 kg/m as calculated from the following:   Height as of this encounter: '5\' 6"'$  (1.676 m).   Weight as of this encounter: 78.9 kg.   Code status:   Code Status: Full Code   DVT Prophylaxis: heparin injection 5,000 Units  Start: 10/23/2022 2200   Family Communication: None at bedside   Disposition Plan: Status is: Inpatient Remains inpatient appropriate because: Severity of illness.   Planned Discharge Destination:SNF when bed available.  Medically stable for transfer to SNF when bed available.   Diet: Diet Order             Diet - low sodium heart  healthy           Diet renal with fluid restriction Fluid restriction: 1200 mL Fluid; Room service appropriate? Yes; Fluid consistency: Thin  Diet effective now                     Antimicrobial agents: Anti-infectives (From admission, onward)    Start     Dose/Rate Route Frequency Ordered Stop   10/12/22 1700  azithromycin (ZITHROMAX) tablet 500 mg        500 mg Oral  Once 10/12/22 1358 10/12/22 1520   10/10/22 1700  azithromycin (ZITHROMAX) 500 mg in sodium chloride 0.9 % 250 mL IVPB  Status:  Discontinued        500 mg 250 mL/hr over 60 Minutes Intravenous Every 24 hours 10/10/22 1429 10/12/22 1358   10/10/22 1200  vancomycin (VANCOREADY) IVPB 750 mg/150 mL  Status:  Discontinued        750 mg 150 mL/hr over 60 Minutes Intravenous Every T-Th-Sa (Hemodialysis) 10/18/2022 2036 10/10/22 0650   10/10/22 0800  cefTRIAXone (ROCEPHIN) 2 g in sodium chloride 0.9 % 100 mL IVPB        2 g 200 mL/hr over 30 Minutes Intravenous Every 24 hours 10/10/22 0650 10/13/22 0925   10/10/22 0800  azithromycin (ZITHROMAX) 500 mg in sodium chloride 0.9 % 250 mL IVPB  Status:  Discontinued        500 mg 250 mL/hr over 60 Minutes Intravenous Every 24 hours 10/10/22 0650 10/10/22 1431   10/09/22 1300  vancomycin (VANCOREADY) IVPB 750 mg/150 mL        750 mg 150 mL/hr over 60 Minutes Intravenous  Once 10/09/22 1219 10/09/22 1434   09/28/2022 2045  ceFEPIme (MAXIPIME) 1 g in sodium chloride 0.9 % 100 mL IVPB  Status:  Discontinued        1 g 200 mL/hr over 30 Minutes Intravenous Daily at bedtime 10/12/2022 2036 10/10/22 0650   10/15/2022 2045  vancomycin (VANCOREADY) IVPB 1500 mg/300 mL        1,500 mg 150 mL/hr over 120 Minutes Intravenous NOW 10/07/2022 2036 10/09/22 0058        MEDICATIONS: Scheduled Meds:  arformoterol  15 mcg Nebulization BID   atorvastatin  40 mg Oral QHS   budesonide (PULMICORT) nebulizer solution  0.25 mg Nebulization BID   Chlorhexidine Gluconate Cloth  6 each Topical Q0600    Chlorhexidine Gluconate Cloth  6 each Topical Q0600   heparin  5,000 Units Subcutaneous Q8H   hydrocerin   Topical TID   ipratropium-albuterol  3 mL Nebulization TID   predniSONE  40 mg Oral Q breakfast   revefenacin  175 mcg Nebulization Daily   Continuous Infusions:   PRN Meds:.albuterol, white petrolatum   I have personally reviewed following labs and imaging studies  LABORATORY DATA: CBC: Recent Labs  Lab 10/14/2022 1747 09/24/2022 1804 10/09/2022 2240 10/09/22 0410 10/10/22 0921 10/11/22 0146 10/12/22 0108  WBC 5.5  --  5.4 6.4 5.9 9.1 10.8*  NEUTROABS 4.0  --   --   --   --   --   --  HGB 13.1   < > 12.7* 12.1* 11.4* 12.2* 12.5*  HCT 38.4*   < > 37.7* 36.2* 34.9* 36.8* 38.6*  MCV 97.5  --  97.4 97.8 99.1 97.9 99.2  PLT 201  --  129* 134* 134* 147* 115*   < > = values in this interval not displayed.     Basic Metabolic Panel: Recent Labs  Lab 10/09/22 0410 10/10/22 0921 10/11/22 0146 10/12/22 0840 10/13/22 0112  NA 129* 128* 131* 128* 129*  K 4.0 4.2 4.4 3.6 4.1  CL 89* 90* 91* 89* 93*  CO2 '27 23 25 25 22  '$ GLUCOSE 141* 221* 145* 188* 208*  BUN 19 35* 29* 51* 34*  CREATININE 3.94* 5.23* 3.64* 4.82* 3.70*  CALCIUM 7.7* 7.7* 8.1* 7.8* 8.1*  MG 1.6* 2.2 1.8  --   --   PHOS 2.7 4.1 3.4 3.5 2.4*     GFR: Estimated Creatinine Clearance: 15.7 mL/min (A) (by C-G formula based on SCr of 3.7 mg/dL (H)).  Liver Function Tests: Recent Labs  Lab 10/09/22 0410 10/10/22 0921 10/11/22 0146 10/12/22 0840 10/13/22 0112  AST 27  --   --   --   --   ALT 11  --   --   --   --   ALKPHOS 63  --   --   --   --   BILITOT 1.2  --   --   --   --   PROT 7.7  --   --   --   --   ALBUMIN 3.4* 3.4* 3.4* 3.2* 3.2*    No results for input(s): "LIPASE", "AMYLASE" in the last 168 hours. No results for input(s): "AMMONIA" in the last 168 hours.  Coagulation Profile: No results for input(s): "INR", "PROTIME" in the last 168 hours.  Cardiac Enzymes: No results for  input(s): "CKTOTAL", "CKMB", "CKMBINDEX", "TROPONINI" in the last 168 hours.  BNP (last 3 results) No results for input(s): "PROBNP" in the last 8760 hours.  Lipid Profile: No results for input(s): "CHOL", "HDL", "LDLCALC", "TRIG", "CHOLHDL", "LDLDIRECT" in the last 72 hours.  Thyroid Function Tests: No results for input(s): "TSH", "T4TOTAL", "FREET4", "T3FREE", "THYROIDAB" in the last 72 hours.  Anemia Panel: No results for input(s): "VITAMINB12", "FOLATE", "FERRITIN", "TIBC", "IRON", "RETICCTPCT" in the last 72 hours.  Urine analysis:    Component Value Date/Time   COLORURINE YELLOW 03/15/2019 [place] 03/15/2019 1644   LABSPEC 1.013 03/15/2019 1644   PHURINE 5.0 03/15/2019 1644   GLUCOSEU 50 (A) 03/15/2019 1644   HGBUR SMALL (A) 03/15/2019 1644   BILIRUBINUR NEGATIVE 03/15/2019 1644   KETONESUR NEGATIVE 03/15/2019 1644   PROTEINUR >=300 (A) 03/15/2019 1644   NITRITE NEGATIVE 03/15/2019 1644   LEUKOCYTESUR NEGATIVE 03/15/2019 1644    Sepsis Labs: Lactic Acid, Venous    Component Value Date/Time   LATICACIDVEN 1.6 06/25/2021 1707    MICROBIOLOGY: Recent Results (from the past 240 hour(s))  Resp panel by RT-PCR (RSV, Flu A&B, Covid) Anterior Nasal Swab     Status: None   Collection Time: 10/19/2022  6:46 PM   Specimen: Anterior Nasal Swab  Result Value Ref Range Status   SARS Coronavirus 2 by RT PCR NEGATIVE NEGATIVE Final    Comment: (NOTE) SARS-CoV-2 target nucleic acids are NOT DETECTED.  The SARS-CoV-2 RNA is generally detectable in upper respiratory specimens during the acute phase of infection. The lowest concentration of SARS-CoV-2 viral copies this assay can detect is 138 copies/mL. A negative result  does not preclude SARS-Cov-2 infection and should not be used as the sole basis for treatment or other patient management decisions. A negative result may occur with  improper specimen collection/handling, submission of specimen other than  nasopharyngeal swab, presence of viral mutation(s) within the areas targeted by this assay, and inadequate number of viral copies(<138 copies/mL). A negative result must be combined with clinical observations, patient history, and epidemiological information. The expected result is Negative.  Fact Sheet for Patients:  EntrepreneurPulse.com.au  Fact Sheet for Healthcare Providers:  IncredibleEmployment.be  This test is no t yet approved or cleared by the [place] FDA and  has been authorized for detection and/or diagnosis of SARS-CoV-2 by FDA under an Emergency Use Authorization (EUA). This EUA will remain  in effect (meaning this test can be used) for the duration of the COVID-19 declaration under Section 564(b)(1) of the Act, 21 U.S.C.section 360bbb-3(b)(1), unless the authorization is terminated  or revoked sooner.       Influenza A by PCR NEGATIVE NEGATIVE Final   Influenza B by PCR NEGATIVE NEGATIVE Final    Comment: (NOTE) The Xpert Xpress SARS-CoV-2/FLU/RSV plus assay is intended as an aid in the diagnosis of influenza from Nasopharyngeal swab specimens and should not be used as a sole basis for treatment. Nasal washings and aspirates are unacceptable for Xpert Xpress SARS-CoV-2/FLU/RSV testing.  Fact Sheet for Patients: EntrepreneurPulse.com.au  Fact Sheet for Healthcare Providers: IncredibleEmployment.be  This test is not yet approved or cleared by the [place] FDA and has been authorized for detection and/or diagnosis of SARS-CoV-2 by FDA under an Emergency Use Authorization (EUA). This EUA will remain in effect (meaning this test can be used) for the duration of the COVID-19 declaration under Section 564(b)(1) of the Act, 21 U.S.C. section 360bbb-3(b)(1), unless the authorization is terminated or revoked.     Resp Syncytial Virus by PCR NEGATIVE NEGATIVE Final    Comment:  (NOTE) Fact Sheet for Patients: EntrepreneurPulse.com.au  Fact Sheet for Healthcare Providers: IncredibleEmployment.be  This test is not yet approved or cleared by the [place] FDA and has been authorized for detection and/or diagnosis of SARS-CoV-2 by FDA under an Emergency Use Authorization (EUA). This EUA will remain in effect (meaning this test can be used) for the duration of the COVID-19 declaration under Section 564(b)(1) of the Act, 21 U.S.C. section 360bbb-3(b)(1), unless the authorization is terminated or revoked.  Performed at [facility], [place] [address], [place], [place] [address]   MRSA Next Gen by PCR, Nasal     Status: None   Collection Time: 10/09/22  1:36 PM   Specimen: Nasal Mucosa; Nasal Swab  Result Value Ref Range Status   MRSA by PCR Next Gen NOT DETECTED NOT DETECTED Final    Comment: (NOTE) The GeneXpert MRSA Assay (FDA approved for NASAL specimens only), is one component of a comprehensive MRSA colonization surveillance program. It is not intended to diagnose MRSA infection nor to guide or monitor treatment for MRSA infections. Test performance is not FDA approved in patients less than 3 years old. Performed at [facility], [place] [address] [place], [place] [address]     RADIOLOGY STUDIES/RESULTS: No results found.   LOS: 7 days   Oren Binet, Cole  [facility]    To contact the attending provider between 7A-7P or the covering provider during after hours 7P-7A, please log into the web site [email or web address] and access using universal [place] password for that web site. If you  do not have the password, please call the hospital operator.  10/15/2022, 8:54 AM

## 2022-10-15 NOTE — Plan of Care (Signed)

## 2022-10-15 NOTE — Progress Notes (Signed)
Pt receives out-pt HD at [facility] on TTS with 7:30 am chair time. Received a message from the [place] HD CSW yesterday to say that pt will not qualify for transportation services through the [place] from snf to HD clinic. SNF that pt admits to will need to provide transportation to [facility] for HD or pt will need to be changed to a local HD clinic that snf will transport to at d/c. Can assist with out-pt HD clinic change if needed.   Melven Sartorius Renal Navigator [phone]

## 2022-10-15 NOTE — Progress Notes (Signed)
Physical Therapy Treatment Patient Details Name: [name]. MRN: [id number] DOB: [birth year] Today's Date: 10/15/2022   History of Present Illness Pt is an 82 y.o. male admitted 1/16 for acute respiratory distress, pulmonary edema. PMH:  ESRD on HD TTS, advanced heart failure, HFrEF 15-20%, hyperlipidemia, hypertension, excessive feet desquamation x 1 week    PT Comments    Patient continues with very poor activity tolerance needing A to prevent fall as rushing back to sit on EOB due to fatigue.  Does report pain in feet also limiting ambulation, but pt fatigued even reaching to try and help put on his shoes.  SpO2 remains in 90's on RA throughout mobility, but dyspnea with any mobility is limiting.  PT will continue to follow.  Remains appropriate for STSNF at d/c.    Recommendations for follow up therapy are one component of a multi-disciplinary discharge planning process, led by the attending physician.  Recommendations may be updated based on patient status, additional functional criteria and insurance authorization.  Follow Up Recommendations  Skilled nursing-short term rehab (<3 hours/day) Can patient physically be transported by private vehicle: Yes   Assistance Recommended at Discharge Frequent or constant Supervision/Assistance  Patient can return home with the following A little help with walking and/or transfers;A little help with bathing/dressing/bathroom;Assist for transportation   Equipment Recommendations  None recommended by PT    Recommendations for Other Services       Precautions / Restrictions Precautions Precautions: Fall Precaution Comments: watch sats     Mobility  Bed Mobility         Supine to sit: Modified independent (Device/Increase time) Sit to supine: Modified independent (Device/Increase time)   General bed mobility comments: some assist to scoot to [facility] pt using rails    Transfers Overall transfer level: Needs assistance Equipment  used: Rollator (4 wheels) Transfers: Sit to/from Stand Sit to Stand: Min guard           General transfer comment: for balance/safety    Ambulation/Gait Ambulation/Gait assistance: Min guard, Min assist Gait Distance (Feet): 22 Feet Assistive device: Rollator (4 wheels) Gait Pattern/deviations: Step-to pattern, Step-through pattern, Decreased stride length, Wide base of support       General Gait Details: assist for safety turning rollator when near the sink due to proximity and haste   Stairs             Wheelchair Mobility    Modified [place] (Stroke Patients Only)       Balance Overall balance assessment: Needs assistance   Sitting balance-Leahy Scale: Good Sitting balance - Comments: seated for donning shoes with A due to feet sore with cracked areas from edema   Standing balance support: Bilateral upper extremity supported, Reliant on assistive device for balance Standing balance-Leahy Scale: Poor                              Cognition Arousal/Alertness: Awake/alert Behavior During Therapy: WFL for tasks assessed/performed Overall Cognitive Status: Within Functional Limits for tasks assessed                                          Exercises      General Comments General comments (skin integrity, edema, etc.): on RA throughout with SpO2 91-100% despite significant dyspnea with exertion      Pertinent Vitals/Pain Pain  Assessment Pain Assessment: Faces Faces Pain Scale: Hurts even more Pain Location: bilateral feet Pain Descriptors / Indicators: Tender, Grimacing, Guarding, Discomfort Pain Intervention(s): Monitored during session, Repositioned, Limited activity within patient's tolerance    Home Living                          Prior Function            PT Goals (current goals can now be found in the care plan section) Progress towards PT goals: Progressing toward goals    Frequency    Min  2X/week      PT Plan Current plan remains appropriate    Co-evaluation              AM-PAC PT "6 Clicks" Mobility   Outcome Measure  Help needed turning from your back to your side while in a flat bed without using bedrails?: None Help needed moving from lying on your back to sitting on the side of a flat bed without using bedrails?: None Help needed moving to and from a bed to a chair (including a wheelchair)?: A Little Help needed standing up from a chair using your arms (e.g., wheelchair or bedside chair)?: A Little Help needed to walk in hospital room?: Total Help needed climbing 3-5 steps with a railing? : Total 6 Click Score: 16    End of Session Equipment Utilized During Treatment: Gait belt Activity Tolerance: Patient limited by pain;Patient limited by fatigue Patient left: in bed;with call bell/phone within reach   PT Visit Diagnosis: Pain;Difficulty in walking, not elsewhere classified (R26.2) Pain - part of body: Ankle and joints of foot     Time: 1539-1610 PT Time Calculation (min) (ACUTE ONLY): 31 min  Charges:  $Gait Training: 8-22 mins $Therapeutic Activity: 8-22 mins                     [name], PT Acute Rehabilitation Services Office:[phone] 10/15/2022    [name] 10/15/2022, 5:29 PM

## 2022-10-15 NOTE — TOC Progression Note (Signed)
Transition of Care (TOC) - Progression Note    Patient Details  Name: [name]. MRN: [id number] Date of Birth: [birth year]  Transition of Care [facility]) CM/SW [place], LCSW Phone Number: 10/15/2022, 4:51 PM  Clinical Narrative:    CSW spoke with [place] social worker. [place] social worker informed CSW that pt was denied for STR at SNF and would need to use primary insurance for placement.   [facility] has extended a offer and pt has accepted. TOC will continue to follow.    Expected Discharge Plan: [place] Barriers to Discharge: Continued Medical Work up  Expected Discharge Plan and Services       Living arrangements for the past 2 months: Apartment                                       Social Determinants of Health (SDOH) Interventions [place]: No Food Insecurity (10/10/2022)  Housing: Low Risk  (10/10/2022)  Transportation Needs: No Transportation Needs (10/10/2022)  Utilities: Not At Risk (10/10/2022)  Tobacco Use: Medium Risk (10/10/2022)    Readmission Risk Interventions     No data to display        Beckey Rutter, MSW, LCSWA, LCASA Transitions of Care  Clinical Social Worker I

## 2022-10-15 NOTE — Discharge Summary (Addendum)
PATIENT DETAILS Name: [name]. Age: 82 y.o. Sex: male Date of Birth: 1941-06-19 MRN: [id number]. Admitting Physician: Kayleen Memos, DO XTG:GYIRSWN, Dibas, MD  Admit Date: 09/24/2022 Discharge date: 10/18/2022  Recommendations for Outpatient Follow-up:  Follow up with PCP in 1-2 weeks Please obtain CMP/CBC in one week Emphasize fluid restriction Hospice/palliative care follow-up SNF  Admitted From:  Home  Disposition: Skilled nursing facility   Discharge Condition: fair  CODE STATUS:   Code Status: DNR   Diet recommendation:  Diet Order             Diet renal with fluid restriction Fluid restriction: Other (see comments); Room service appropriate? Yes; Fluid consistency: Thin  Diet effective now           Diet - low sodium heart healthy                    Brief Summary: Patient is a 82 y.o.  male with history of ESRD on HD TTS, HFrEF, COPD, HTN, HLD who presented with SOB x 1 day.  Patient was found to have acute hypoxic respiratory failure-requiring BiPAP on initial presentation.   Significant events: 1/16>> SOB-hypoxia-requiring BiPAP.  Last HD on 1/16 as an outpatient. 1/24>>developed worsening SOB-non compliant with Fluid restrictions-PCCM/Cards consult   Significant studies: 1/16>> CXR: Moderate right pleural effusion-right basilar consolidation 1/24>> echo: IO<27%, RV systolic function severely reduced.   Significant microbiology data: 1/16>> COVID/influenza/RSV PCR: Negative   Procedures: 1/24>> thoracocentesis   Consults: Nephrology PCCM Cardiology  Brief Hospital Course: Acute hypoxic respiratory failure-multifactorial etiology-right lobar pneumonia/pleural effusion/COPD exacerbation/volume overload in the setting of ESRD/HFrEF exacerbation Has completed a course of antibiotic/steroids-volume removal with HD and is s/p thoracocentesis on 1/24 without any significant improvement.    Unfortunately appears to have  end-stage/advanced HFrEF-still with dyspnea with minimal activity Continue volume removal with HD is much as possible-now a DNR-Per cardiology not a candidate for advanced HF therapies.  Will need hospice/palliative care follow-up at SNF   Chronic right pleural effusion Reviewed chart-prior notes-appears to have a chronic right pleural effusion-that has been tapped in the past (has undergone T thoracentesis September and November 2023-transudate) this is felt to be due to HFrEF and ESRD Due to persistent SOB in spite of aggressive dialysis-underwent a thoracocentesis on 1/24-without any significant improvement in his dyspnea-he is now thought to have advanced probably end-stage HFrEF at this point.     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 Unfortunately-no improvement in spite of multiple rounds of hemodialysis and thoracocentesis repeat echo on 1/24 showed EF less than 20%.   He is now thought to have probably end-stage/advanced HF-unfortunately per cardiology-he is not a candidate for advanced HF therapies  Blood pressure soft but tolerating beta-blocker Long discussion with patient on 1/25-he understands tenuous clinical condition-potential for sudden death syndrome/probable need for hospice in the near future.  Does not want aggressive care-agreeable for DNR.  Needs hospice/pall care to follow post discharge Patient's significant other April and daughter Otila Kluver aware of above-and DNR status-potential need for hospice care in the near future.     New onset a flutter Rate controlled with beta-blocker Started  Eliquis by cardiology  on 1/24   ESRD on HD TTS Nephrology followed closely-although DNR-and has end stage CHF-he wants to continue with HD for now.    Hyponatremia Persistent hyponatremia in the setting of advanced HFrEF-dietary noncompliance to fluid restriction-ESRD Volume removal with HD Emphasize fluid restriction Follow electrolytes periodically   Hypomagnesemia Repleted   HTN BP currently soft in the low 478G systolic Continue metoprolol   HLD Statin   Thrombocytopenia Mild Seems to be a chronic issue ongoing for the past several years. Repeat CBC periodically.   Desquamation of skin of bilateral lower extremities eucerin ointment Overall much improved over the past several days.    BMI: Estimated body mass index is 27.51 kg/m as calculated from the following:   Height as of this encounter: '5\' 6"'$  (1.676 m).   Weight as of this encounter: 77.3 kg.   Discharge Diagnoses:  Principal Problem:   Acute respiratory distress Active Problems:   Atrial flutter (HCC)   ESRD (end stage renal disease) on dialysis (HCC)   Acute on chronic combined systolic and diastolic heart failure (HCC)   End stage heart failure [facility])   Discharge Instructions:  Activity:  As tolerated with Full fall precautions use walker/cane & assistance as needed   Discharge Instructions     Call MD for:  difficulty breathing, headache or visual disturbances   Complete by: As directed    Call MD for:  persistant dizziness or light-headedness   Complete by: As directed    Diet - low sodium heart healthy   Complete by: As directed    Discharge instructions   Complete by: As directed    Follow with Primary MD  Koirala, Dibas, MD in 1-2 weeks  Please get a complete blood count and chemistry panel checked by your Primary MD at your next visit, and again as instructed by your Primary MD.  Get Medicines reviewed and adjusted: Please take all your medications with you for your next visit with  your Primary MD  Laboratory/radiological data: Please request your Primary MD to go over all hospital tests and procedure/radiological results at the follow up, please ask your Primary MD to get all Hospital records sent to his/her office.  In some cases, they will be blood work, cultures and biopsy results pending at the time of your discharge. Please request that your primary care M.D. follows up on these results.  Also Note the following: If you experience worsening of your admission symptoms, develop shortness of breath, life threatening emergency, suicidal or homicidal thoughts you must seek medical attention immediately by calling 911 or calling your MD immediately  if symptoms less severe.  You must read complete instructions/literature along with all the possible adverse reactions/side effects for all the Medicines you take and that have been prescribed to you. Take any new Medicines after you have completely understood and accpet all the possible adverse reactions/side effects.   Do not drive when taking Pain medications or sleeping medications (Benzodaizepines)  Do not take more than prescribed Pain, Sleep and Anxiety Medications. It is not advisable to combine anxiety,sleep and pain medications without talking with your primary care practitioner  Special Instructions: If you have smoked or chewed Tobacco  in the last 2 yrs please stop smoking, stop any regular Alcohol  and or any Recreational drug use.  Wear Seat belts while driving.  Please note: You were cared for by a hospitalist during your hospital stay. Once you are discharged, your primary care physician will handle any further medical  issues. Please note that NO REFILLS for any discharge medications will be authorized once you are discharged, as it is imperative that you return to your primary care physician (or establish a relationship with a primary care physician if you do not have one) for your post hospital discharge  needs so that they can reassess your need for medications and monitor your lab values.   Increase activity slowly   Complete by: As directed       Allergies as of 10/18/2022   No Known Allergies      Medication List     STOP taking these medications    aspirin EC 81 MG tablet   HYDROPHILIC EX   isosorbide-hydrALAZINE 20-37.5 MG tablet Commonly known as: BIDIL   oxyCODONE-acetaminophen 5-325 MG tablet Commonly known as: Percocet   Stiolto Respimat 2.5-2.5 MCG/ACT Aers Generic drug: Tiotropium Bromide-Olodaterol       TAKE these medications    albuterol 108 (90 Base) MCG/ACT inhaler Commonly known as: VENTOLIN HFA Inhale 2 puffs into the lungs every 4 (four) hours as needed for shortness of breath. What changed:  when to take this reasons to take this   albuterol (2.5 MG/3ML) 0.083% nebulizer solution Commonly known as: PROVENTIL Take 3 mLs (2.5 mg total) by nebulization every 2 (two) hours as needed for wheezing or shortness of breath. What changed: You were already taking a medication with the same name, and this prescription was added. Make sure you understand how and when to take each.   apixaban 2.5 MG Tabs tablet Commonly known as: ELIQUIS Take 1 tablet (2.5 mg total) by mouth 2 (two) times daily.   atorvastatin 40 MG tablet Commonly known as: LIPITOR Take 40 mg by mouth at bedtime.   budesonide-formoterol 160-4.5 MCG/ACT inhaler Commonly known as: Symbicort Inhale 2 puffs into the lungs 2 (two) times daily.   cinacalcet 30 MG tablet Commonly known as: SENSIPAR Take 15 mg by mouth See admin instructions. Take 15 mg by mouth every Tuesday, Thursday, and Saturday   hydrocerin Crea Apply 1 Application topically 3 (three) times daily.   ipratropium-albuterol 0.5-2.5 (3) MG/3ML Soln Commonly known as: DUONEB Take 3 mLs by nebulization 3 (three) times daily.   metoprolol succinate 25 MG 24 hr tablet Commonly known as: TOPROL-XL Take 1 tablet (25 mg  total) by mouth daily. What changed: how much to take   tiotropium 18 MCG inhalation capsule Commonly known as: Spiriva HandiHaler Place 1 capsule (18 mcg total) into inhaler and inhale daily.   white petrolatum Oint Commonly known as: VASELINE Apply 1 Application topically as needed for lip care or dry skin.        Follow-up Information     Koirala, Dibas, MD. Schedule an appointment as soon as possible for a visit in 1 week(s).   Specialty: Family Medicine Contact information: [place] [address] [phone]                No Known Allergies   Other Procedures/Studies: ECHOCARDIOGRAM COMPLETE  Result Date: 10/16/2022    ECHOCARDIOGRAM REPORT   Patient Name:   [name]. Date of Exam: 10/16/2022 Medical Rec #:  [id number]           Height:       66.0 in Accession #:    [id number]          Weight:       172.0 lb Date of Birth:  [birth year]  BSA:          1.875 m Patient Age:    78 years            BP:           106/82 mmHg Patient Gender: M                   HR:           73 bpm. Exam Location:  Inpatient Procedure: 2D Echo and Intracardiac Opacification Agent Indications:    CHF  History:        Patient has prior history of Echocardiogram examinations, most                 recent 04/20/2019. CAD, COPD, Arrythmias:LBBB; Risk                 Factors:Hypertension and Diabetes.  Sonographer:    Harvie Junior Referring Phys: [place]  1. Prominent apical LV trabeculations . Left ventricular ejection fraction, by estimation, is <20%. The left ventricle has severely decreased function. The left ventricle demonstrates global hypokinesis. The left ventricular internal cavity size was moderately dilated. There is mild left ventricular hypertrophy. Left ventricular diastolic parameters are consistent with Grade II diastolic dysfunction (pseudonormalization). Elevated left ventricular end-diastolic pressure.  2.  Right ventricular systolic function is severely reduced. The right ventricular size is moderately enlarged. There is normal pulmonary artery systolic pressure.  3. Left atrial size was moderately dilated.  4. Right atrial size was moderately dilated.  5. A small pericardial effusion is present. The pericardial effusion is posterior to the left ventricle.  6. The mitral valve is abnormal. Mild mitral valve regurgitation. No evidence of mitral stenosis.  7. TV leaflets are thickened with mal coaptation . Tricuspid valve regurgitation is severe.  8. The aortic valve is tricuspid. There is moderate calcification of the aortic valve. There is moderate thickening of the aortic valve. Aortic valve regurgitation is not visualized. Aortic valve sclerosis/calcification is present, without any evidence of aortic stenosis.  9. The inferior vena cava is normal in size with greater than 50% respiratory variability, suggesting right atrial pressure of 3 mmHg. FINDINGS  Left Ventricle: Prominent apical LV trabeculations. Left ventricular ejection fraction, by estimation, is <20%. The left ventricle has severely decreased function. The left ventricle demonstrates global hypokinesis. Definity contrast agent was given IV to delineate the left ventricular endocardial borders. The left ventricular internal cavity size was moderately dilated. There is mild left ventricular hypertrophy. Left ventricular diastolic parameters are consistent with Grade II diastolic dysfunction (pseudonormalization). Elevated left ventricular end-diastolic pressure. Right Ventricle: The right ventricular size is moderately enlarged. No increase in right ventricular wall thickness. Right ventricular systolic function is severely reduced. There is normal pulmonary artery systolic pressure. The tricuspid regurgitant velocity is 2.52 m/s, and with an assumed right atrial pressure of 3 mmHg, the estimated right ventricular systolic pressure is 02.4 mmHg. Left  Atrium: Left atrial size was moderately dilated. Right Atrium: Right atrial size was moderately dilated. Pericardium: A small pericardial effusion is present. The pericardial effusion is posterior to the left ventricle. Mitral Valve: The mitral valve is abnormal. There is mild thickening of the mitral valve leaflet(s). Mild mitral valve regurgitation. No evidence of mitral valve stenosis. Tricuspid Valve: TV leaflets are thickened with mal coaptation. The tricuspid valve is normal in structure. Tricuspid valve regurgitation is severe. No evidence of tricuspid stenosis. Aortic Valve: The aortic valve is tricuspid. There is  moderate calcification of the aortic valve. There is moderate thickening of the aortic valve. Aortic valve regurgitation is not visualized. Aortic valve sclerosis/calcification is present, without any  evidence of aortic stenosis. Aortic valve mean gradient measures 3.0 mmHg. Aortic valve peak gradient measures 5.4 mmHg. Aortic valve area, by VTI measures 1.44 cm. Pulmonic Valve: The pulmonic valve was normal in structure. Pulmonic valve regurgitation is mild. No evidence of pulmonic stenosis. Aorta: The aortic root is normal in size and structure. Venous: The inferior vena cava is normal in size with greater than 50% respiratory variability, suggesting right atrial pressure of 3 mmHg. IAS/Shunts: No atrial level shunt detected by color flow Doppler.  LEFT VENTRICLE PLAX 2D LVIDd:         5.60 cm      Diastology LVIDs:         5.30 cm      LV e' medial:    3.88 cm/s LV PW:         1.30 cm      LV E/e' medial:  27.1 LV IVS:        1.30 cm      LV e' lateral:   8.42 cm/s LVOT diam:     2.00 cm      LV E/e' lateral: 12.5 LV SV:         30 LV SV Index:   16 LVOT Area:     3.14 cm                              3D Volume EF: LV Volumes (MOD)            3D EF:        25 % LV vol d, MOD A2C: 293.0 ml LV EDV:       286 ml LV vol d, MOD A4C: 301.0 ml LV ESV:       214 ml LV vol s, MOD A2C: 284.0 ml LV SV:         71 ml LV vol s, MOD A4C: 282.0 ml LV SV MOD A2C:     9.0 ml LV SV MOD A4C:     301.0 ml LV SV MOD BP:      23.4 ml RIGHT VENTRICLE RV Basal diam:  5.70 cm RV Mid diam:    4.10 cm RV S prime:     6.07 cm/s TAPSE (M-mode): 0.9 cm LEFT ATRIUM              Index        RIGHT ATRIUM           Index LA diam:        4.30 cm  2.29 cm/m   RA Area:     26.60 cm LA Vol (A2C):   129.0 ml 68.78 ml/m  RA Volume:   89.20 ml  47.56 ml/m LA Vol (A4C):   93.6 ml  49.91 ml/m LA Biplane Vol: 112.0 ml 59.72 ml/m  AORTIC VALVE                    PULMONIC VALVE AV Area (Vmax):    1.68 cm     PV Vmax:          0.81 m/s AV Area (Vmean):   1.56 cm     PV Peak grad:     2.6 mmHg AV Area (VTI):     1.44 cm  PR End Diast Vel: 2.63 msec AV Vmax:           115.75 cm/s AV Vmean:          77.550 cm/s AV VTI:            0.209 m AV Peak Grad:      5.4 mmHg AV Mean Grad:      3.0 mmHg LVOT Vmax:         62.00 cm/s LVOT Vmean:        38.550 cm/s LVOT VTI:          0.096 m LVOT/AV VTI ratio: 0.46  AORTA Ao Root diam: 3.50 cm MITRAL VALVE                  TRICUSPID VALVE MV Area (PHT): 4.80 cm       TR Peak grad:   25.4 mmHg MV Decel Time: 158 msec       TR Vmax:        252.00 cm/s MR Peak grad:    52.7 mmHg MR Mean grad:    38.0 mmHg    SHUNTS MR Vmax:         363.00 cm/s  Systemic VTI:  0.10 m MR Vmean:        290.0 cm/s   Systemic Diam: 2.00 cm MR PISA:         2.26 cm MR PISA Eff ROA: 15 mm MR PISA Radius:  0.60 cm MV E velocity: 105.42 cm/s MV A velocity: 33.10 cm/s MV E/A ratio:  3.19 Jenkins Rouge MD Electronically signed by Jenkins Rouge MD Signature Date/Time: 10/16/2022/5:28:42 PM    Final    DG CHEST PORT 1 VIEW  Result Date: 10/16/2022 CLINICAL DATA:  Status post thoracentesis EXAM: PORTABLE CHEST 1 VIEW COMPARISON:  10/16/22 CXR FINDINGS: Right sided tunneled central venous catheter with the tip at the cavoatrial junction. Redemonstrated enlarged cardiac contours.Persistent right sided pleural effusion, not significantly  changed from prior exam. No pneumothorax. Persistent right basilar pulmonary opacity nonspecific, but favored to represent atelectasis. No new focal airspace opacity. No displaced rib fractures. Visualized upper abdomen is unremarkable. IMPRESSION: 1. Persistent right sided pleural effusion, not significantly changed from prior exam. No pneumothorax. 2. Persistent right basilar pulmonary opacity, nonspecific, but favored to represent atelectasis. Electronically Signed   By: Marin Roberts M.D.   On: 10/16/2022 14:39   DG Chest Port 1V same Day  Result Date: 10/16/2022 CLINICAL DATA:  Shortness of breath EXAM: PORTABLE CHEST 1 VIEW COMPARISON:  Chest x-ray dated October 15, 2022 FINDINGS: Unchanged cardiomegaly. Right chest wall central venous catheter is unchanged in position. Unchanged small to moderate right pleural effusion. Bibasilar atelectasis. No evidence of pneumothorax. IMPRESSION: Small to moderate right pleural effusion and bibasilar atelectasis, unchanged when compared with the prior. Electronically Signed   By: Yetta Glassman M.D.   On: 10/16/2022 09:25   DG CHEST PORT 1 VIEW  Result Date: 10/15/2022 CLINICAL DATA:  Shortness of breath EXAM: PORTABLE CHEST 1 VIEW COMPARISON:  10/10/2022 and older FINDINGS: Underinflation. Enlarged cardiopericardial silhouette. There is a moderate right effusion. No pneumothorax. Vascular congestion. Tortuous ectatic aorta. There is a right IJ double-lumen catheter with tip at the SVC right atrial junction. The left inferior costophrenic angle is clipped at the edge of the film. Film is under penetrated. IMPRESSION: 1. Cardiomegaly with increasing vascular congestion and moderate right effusion. 2. Right IJ double-lumen catheter with tip at the SVC right atrial junction. Electronically  Signed   By: Jill Side M.D.   On: 10/15/2022 14:18   DG Chest Port 1 View  Result Date: 10/10/2022 CLINICAL DATA:  Shortness of breath EXAM: PORTABLE CHEST 1 VIEW  COMPARISON:  Chest x-ray dated October 08, 2022 FINDINGS: Unchanged cardiomegaly. Stable position of right central venous catheter. Stable moderate right pleural effusion associated atelectasis. Evidence of pneumothorax. IMPRESSION: 1. Stable moderate right pleural effusion associated atelectasis. 2. Unchanged cardiomegaly. Electronically Signed   By: Yetta Glassman M.D.   On: 10/10/2022 08:25   DG Chest Port 1 View  Result Date: 10/22/2022 CLINICAL DATA:  Triage notes:"Pt arrived via [facility] for shortness of breath from home. Pt was on couch in apparent distress, accessory muscle use, extended exhalation. Pt reports shortness of breath post dialysis. EMS report expiratory wheezing EXAM: PORTABLE CHEST - 1 VIEW COMPARISON:  09/11/2022 FINDINGS: Stable tunneled right IJ hemodialysis catheter to the cavoatrial junction. Persistent moderate right pleural effusion with atelectasis/consolidation at the right lung base. Left lung clear. Stable cardiomegaly. No pneumothorax. Orthopedic anchor in the right humeral head. IMPRESSION: Persistent moderate right pleural effusion with right basilar atelectasis/consolidation. Electronically Signed   By: Lucrezia Europe M.D.   On: 09/25/2022 17:45   VAS [place] UPPER EXTREMITY ARTERIAL DUPLEX  Result Date: 09/30/2022  UPPER EXTREMITY DUPLEX STUDY Patient Name:  [name].  Date of Exam:   09/30/2022 Medical Rec #: [id number]            Accession #:    [id number] Date of Birth: 1941-05-18            Patient Gender: M Patient Age:   67 years Exam Location:  [facility] Procedure:      VAS [place] UPPER EXTREMITY ARTERIAL DUPLEX Referring Phys: Harold Barban --------------------------------------------------------------------------------  Indications: No palpable thrill left upper extremity AVG. Concern for upper              extremity arterial perfusion during AVG placement.  Comparison Study: No prior study Performing Technologist: Maudry Mayhew MHA, RDMS, RVT,  RDCS  Examination Guidelines: A complete evaluation includes B-mode imaging, spectral Doppler, color Doppler, and power Doppler as needed of all accessible portions of each vessel. Bilateral testing is considered an integral part of a complete examination. Limited examinations for reoccurring indications may be performed as noted.  Left Doppler Findings: +---------------+----------+---------+--------+--------+ Site           PSV (cm/s)Waveform StenosisComments +---------------+----------+---------+--------+--------+ Subclavian Prox52        triphasic                 +---------------+----------+---------+--------+--------+ Subclavian Mid 36        triphasic                 +---------------+----------+---------+--------+--------+ Subclavian Dist65        triphasic                 +---------------+----------+---------+--------+--------+ Axillary       45        triphasic                 +---------------+----------+---------+--------+--------+ Brachial Prox  32        triphasic                 +---------------+----------+---------+--------+--------+ Brachial Dist  34        triphasic                 +---------------+----------+---------+--------+--------+ Radial Prox    12  triphasic                 +---------------+----------+---------+--------+--------+ Radial Mid     10        triphasic                 +---------------+----------+---------+--------+--------+ Radial Dist    32        triphasic                 +---------------+----------+---------+--------+--------+ Ulnar Dist     15        triphasic                 +---------------+----------+---------+--------+--------+   Summary:  Left: Patent left upper extremity arterial tree with no obvious       evidence of hemodynamically significant stenosis.        Left upper extremity AVG is occluded throughout entirety of       graft. *See table(s) above for measurements and observations. Electronically  signed by Harold Barban MD on 09/30/2022 at 6:49:41 PM.    Final      TODAY-DAY OF DISCHARGE:  Subjective:   [name] today has no headache,no chest abdominal pain,no new weakness tingling or numbness, feels much better wants to go home today.  Objective:   Blood pressure 98/65, pulse 84, temperature 98.4 F (36.9 C), temperature source Oral, resp. rate 17, height '5\' 6"'$  (1.676 m), weight 76.5 kg, SpO2 98 %.  Intake/Output Summary (Last 24 hours) at 10/18/2022 0657 Last data filed at 10/17/2022 1146 Gross per 24 hour  Intake --  Output 3000 ml  Net -3000 ml   Filed Weights   10/17/22 0800 10/17/22 1234 10/18/22 0408  Weight: 78.9 kg 75.9 kg 76.5 kg    Exam: Awake Alert, Oriented *3, No new F.N deficits, Normal affect .AT,PERRAL Supple Neck,No JVD, No cervical lymphadenopathy appriciated.  Symmetrical Chest wall movement, Good air movement bilaterally, CTAB RRR,No Gallops,Rubs or new Murmurs, No Parasternal Heave +ve B.Sounds, Abd Soft, Non tender, No organomegaly appriciated, No rebound -guarding or rigidity. No Cyanosis, Clubbing or edema, No new Rash or bruise   PERTINENT RADIOLOGIC STUDIES: ECHOCARDIOGRAM COMPLETE  Result Date: 10/16/2022    ECHOCARDIOGRAM REPORT   Patient Name:   [name]. Date of Exam: 10/16/2022 Medical Rec #:  [id number]           Height:       66.0 in Accession #:    [id number]          Weight:       172.0 lb Date of Birth:  28-Sep-1940           BSA:          1.875 m Patient Age:    51 years            BP:           106/82 mmHg Patient Gender: M                   HR:           73 bpm. Exam Location:  Inpatient Procedure: 2D Echo and Intracardiac Opacification Agent Indications:    CHF  History:        Patient has prior history of Echocardiogram examinations, most                 recent 04/20/2019. CAD, COPD, Arrythmias:LBBB; Risk  Factors:Hypertension and Diabetes.  Sonographer:    Harvie Junior Referring Phys: [place]  1. Prominent apical LV trabeculations . Left ventricular ejection fraction, by estimation, is <20%. The left ventricle has severely decreased function. The left ventricle demonstrates global hypokinesis. The left ventricular internal cavity size was moderately dilated. There is mild left ventricular hypertrophy. Left ventricular diastolic parameters are consistent with Grade II diastolic dysfunction (pseudonormalization). Elevated left ventricular end-diastolic pressure.  2. Right ventricular systolic function is severely reduced. The right ventricular size is moderately enlarged. There is normal pulmonary artery systolic pressure.  3. Left atrial size was moderately dilated.  4. Right atrial size was moderately dilated.  5. A small pericardial effusion is present. The pericardial effusion is posterior to the left ventricle.  6. The mitral valve is abnormal. Mild mitral valve regurgitation. No evidence of mitral stenosis.  7. TV leaflets are thickened with mal coaptation . Tricuspid valve regurgitation is severe.  8. The aortic valve is tricuspid. There is moderate calcification of the aortic valve. There is moderate thickening of the aortic valve. Aortic valve regurgitation is not visualized. Aortic valve sclerosis/calcification is present, without any evidence of aortic stenosis.  9. The inferior vena cava is normal in size with greater than 50% respiratory variability, suggesting right atrial pressure of 3 mmHg. FINDINGS  Left Ventricle: Prominent apical LV trabeculations. Left ventricular ejection fraction, by estimation, is <20%. The left ventricle has severely decreased function. The left ventricle demonstrates global hypokinesis. Definity contrast agent was given IV to delineate the left ventricular endocardial borders. The left ventricular internal cavity size was moderately dilated. There is mild left ventricular hypertrophy. Left ventricular diastolic parameters are consistent with  Grade II diastolic dysfunction (pseudonormalization). Elevated left ventricular end-diastolic pressure. Right Ventricle: The right ventricular size is moderately enlarged. No increase in right ventricular wall thickness. Right ventricular systolic function is severely reduced. There is normal pulmonary artery systolic pressure. The tricuspid regurgitant velocity is 2.52 m/s, and with an assumed right atrial pressure of 3 mmHg, the estimated right ventricular systolic pressure is 18.8 mmHg. Left Atrium: Left atrial size was moderately dilated. Right Atrium: Right atrial size was moderately dilated. Pericardium: A small pericardial effusion is present. The pericardial effusion is posterior to the left ventricle. Mitral Valve: The mitral valve is abnormal. There is mild thickening of the mitral valve leaflet(s). Mild mitral valve regurgitation. No evidence of mitral valve stenosis. Tricuspid Valve: TV leaflets are thickened with mal coaptation. The tricuspid valve is normal in structure. Tricuspid valve regurgitation is severe. No evidence of tricuspid stenosis. Aortic Valve: The aortic valve is tricuspid. There is moderate calcification of the aortic valve. There is moderate thickening of the aortic valve. Aortic valve regurgitation is not visualized. Aortic valve sclerosis/calcification is present, without any  evidence of aortic stenosis. Aortic valve mean gradient measures 3.0 mmHg. Aortic valve peak gradient measures 5.4 mmHg. Aortic valve area, by VTI measures 1.44 cm. Pulmonic Valve: The pulmonic valve was normal in structure. Pulmonic valve regurgitation is mild. No evidence of pulmonic stenosis. Aorta: The aortic root is normal in size and structure. Venous: The inferior vena cava is normal in size with greater than 50% respiratory variability, suggesting right atrial pressure of 3 mmHg. IAS/Shunts: No atrial level shunt detected by color flow Doppler.  LEFT VENTRICLE PLAX 2D LVIDd:         5.60 cm       Diastology LVIDs:         5.30  cm      LV e' medial:    3.88 cm/s LV PW:         1.30 cm      LV E/e' medial:  27.1 LV IVS:        1.30 cm      LV e' lateral:   8.42 cm/s LVOT diam:     2.00 cm      LV E/e' lateral: 12.5 LV SV:         30 LV SV Index:   16 LVOT Area:     3.14 cm                              3D Volume EF: LV Volumes (MOD)            3D EF:        25 % LV vol d, MOD A2C: 293.0 ml LV EDV:       286 ml LV vol d, MOD A4C: 301.0 ml LV ESV:       214 ml LV vol s, MOD A2C: 284.0 ml LV SV:        71 ml LV vol s, MOD A4C: 282.0 ml LV SV MOD A2C:     9.0 ml LV SV MOD A4C:     301.0 ml LV SV MOD BP:      23.4 ml RIGHT VENTRICLE RV Basal diam:  5.70 cm RV Mid diam:    4.10 cm RV S prime:     6.07 cm/s TAPSE (M-mode): 0.9 cm LEFT ATRIUM              Index        RIGHT ATRIUM           Index LA diam:        4.30 cm  2.29 cm/m   RA Area:     26.60 cm LA Vol (A2C):   129.0 ml 68.78 ml/m  RA Volume:   89.20 ml  47.56 ml/m LA Vol (A4C):   93.6 ml  49.91 ml/m LA Biplane Vol: 112.0 ml 59.72 ml/m  AORTIC VALVE                    PULMONIC VALVE AV Area (Vmax):    1.68 cm     PV Vmax:          0.81 m/s AV Area (Vmean):   1.56 cm     PV Peak grad:     2.6 mmHg AV Area (VTI):     1.44 cm     PR End Diast Vel: 2.63 msec AV Vmax:           115.75 cm/s AV Vmean:          77.550 cm/s AV VTI:            0.209 m AV Peak Grad:      5.4 mmHg AV Mean Grad:      3.0 mmHg LVOT Vmax:         62.00 cm/s LVOT Vmean:        38.550 cm/s LVOT VTI:          0.096 m LVOT/AV VTI ratio: 0.46  AORTA Ao Root diam: 3.50 cm MITRAL VALVE                  TRICUSPID VALVE MV Area (PHT): 4.80 cm  TR Peak grad:   25.4 mmHg MV Decel Time: 158 msec       TR Vmax:        252.00 cm/s MR Peak grad:    52.7 mmHg MR Mean grad:    38.0 mmHg    SHUNTS MR Vmax:         363.00 cm/s  Systemic VTI:  0.10 m MR Vmean:        290.0 cm/s   Systemic Diam: 2.00 cm MR PISA:         2.26 cm MR PISA Eff ROA: 15 mm MR PISA Radius:  0.60 cm MV E velocity:  105.42 cm/s MV A velocity: 33.10 cm/s MV E/A ratio:  3.19 Jenkins Rouge MD Electronically signed by Jenkins Rouge MD Signature Date/Time: 10/16/2022/5:28:42 PM    Final    DG CHEST PORT 1 VIEW  Result Date: 10/16/2022 CLINICAL DATA:  Status post thoracentesis EXAM: PORTABLE CHEST 1 VIEW COMPARISON:  10/16/22 CXR FINDINGS: Right sided tunneled central venous catheter with the tip at the cavoatrial junction. Redemonstrated enlarged cardiac contours.Persistent right sided pleural effusion, not significantly changed from prior exam. No pneumothorax. Persistent right basilar pulmonary opacity nonspecific, but favored to represent atelectasis. No new focal airspace opacity. No displaced rib fractures. Visualized upper abdomen is unremarkable. IMPRESSION: 1. Persistent right sided pleural effusion, not significantly changed from prior exam. No pneumothorax. 2. Persistent right basilar pulmonary opacity, nonspecific, but favored to represent atelectasis. Electronically Signed   By: Marin Roberts M.D.   On: 10/16/2022 14:39   DG Chest Port 1V same Day  Result Date: 10/16/2022 CLINICAL DATA:  Shortness of breath EXAM: PORTABLE CHEST 1 VIEW COMPARISON:  Chest x-ray dated October 15, 2022 FINDINGS: Unchanged cardiomegaly. Right chest wall central venous catheter is unchanged in position. Unchanged small to moderate right pleural effusion. Bibasilar atelectasis. No evidence of pneumothorax. IMPRESSION: Small to moderate right pleural effusion and bibasilar atelectasis, unchanged when compared with the prior. Electronically Signed   By: Yetta Glassman M.D.   On: 10/16/2022 09:25     PERTINENT LAB RESULTS: CBC: Recent Labs    10/15/22 0852  WBC 8.8  HGB 12.4*  HCT 36.3*  PLT 129*   CMET CMP     Component Value Date/Time   NA 128 (L) 10/18/2022 0010   K 4.1 10/18/2022 0010   CL 88 (L) 10/18/2022 0010   CO2 26 10/18/2022 0010   GLUCOSE 86 10/18/2022 0010   BUN 37 (H) 10/18/2022 0010   CREATININE 3.88 (H)  10/18/2022 0010   CREATININE 3.07 (HH) 07/03/2018 1515   CALCIUM 8.4 (L) 10/18/2022 0010   PROT 7.7 10/09/2022 0410   ALBUMIN 3.3 (L) 10/18/2022 0010   AST 27 10/09/2022 0410   AST 15 07/03/2018 1515   ALT 11 10/09/2022 0410   ALT 15 07/03/2018 1515   ALKPHOS 63 10/09/2022 0410   BILITOT 1.2 10/09/2022 0410   BILITOT 0.3 07/03/2018 1515   GFRNONAA 15 (L) 10/18/2022 0010   GFRNONAA 18 (L) 07/03/2018 1515   GFRAA 12 (L) 08/14/2019 1206   GFRAA 21 (L) 07/03/2018 1515    GFR Estimated Creatinine Clearance: 13.7 mL/min (A) (by C-G formula based on SCr of 3.88 mg/dL (H)). No results for input(s): "LIPASE", "AMYLASE" in the last 72 hours. No results for input(s): "CKTOTAL", "CKMB", "CKMBINDEX", "TROPONINI" in the last 72 hours. Invalid input(s): "POCBNP" No results for input(s): "DDIMER" in the last 72 hours. No results for input(s): "HGBA1C" in the last 72 hours.  Recent Labs    10/17/22 0059  CHOL 161  HDL 81  LDLCALC 71  TRIG 44  CHOLHDL 2.0   No results for input(s): "TSH", "T4TOTAL", "T3FREE", "THYROIDAB" in the last 72 hours.  Invalid input(s): "FREET3" No results for input(s): "VITAMINB12", "FOLATE", "FERRITIN", "TIBC", "IRON", "RETICCTPCT" in the last 72 hours. Coags: No results for input(s): "INR" in the last 72 hours.  Invalid input(s): "PT" Microbiology: Recent Results (from the past 240 hour(s))  Resp panel by RT-PCR (RSV, Flu A&B, Covid) Anterior Nasal Swab     Status: None   Collection Time: 10/02/2022  6:46 PM   Specimen: Anterior Nasal Swab  Result Value Ref Range Status   SARS Coronavirus 2 by RT PCR NEGATIVE NEGATIVE Final    Comment: (NOTE) SARS-CoV-2 target nucleic acids are NOT DETECTED.  The SARS-CoV-2 RNA is generally detectable in upper respiratory specimens during the acute phase of infection. The lowest concentration of SARS-CoV-2 viral copies this assay can detect is 138 copies/mL. A negative result does not preclude SARS-Cov-2 infection and  should not be used as the sole basis for treatment or other patient management decisions. A negative result may occur with  improper specimen collection/handling, submission of specimen other than nasopharyngeal swab, presence of viral mutation(s) within the areas targeted by this assay, and inadequate number of viral copies(<138 copies/mL). A negative result must be combined with clinical observations, patient history, and epidemiological information. The expected result is Negative.  Fact Sheet for Patients:  EntrepreneurPulse.com.au  Fact Sheet for Healthcare Providers:  IncredibleEmployment.be  This test is no t yet approved or cleared by the [place] FDA and  has been authorized for detection and/or diagnosis of SARS-CoV-2 by FDA under an Emergency Use Authorization (EUA). This EUA will remain  in effect (meaning this test can be used) for the duration of the COVID-19 declaration under Section 564(b)(1) of the Act, 21 U.S.C.section 360bbb-3(b)(1), unless the authorization is terminated  or revoked sooner.       Influenza A by PCR NEGATIVE NEGATIVE Final   Influenza B by PCR NEGATIVE NEGATIVE Final    Comment: (NOTE) The Xpert Xpress SARS-CoV-2/FLU/RSV plus assay is intended as an aid in the diagnosis of influenza from Nasopharyngeal swab specimens and should not be used as a sole basis for treatment. Nasal washings and aspirates are unacceptable for Xpert Xpress SARS-CoV-2/FLU/RSV testing.  Fact Sheet for Patients: EntrepreneurPulse.com.au  Fact Sheet for Healthcare Providers: IncredibleEmployment.be  This test is not yet approved or cleared by the [place] FDA and has been authorized for detection and/or diagnosis of SARS-CoV-2 by FDA under an Emergency Use Authorization (EUA). This EUA will remain in effect (meaning this test can be used) for the duration of the COVID-19 declaration  under Section 564(b)(1) of the Act, 21 U.S.C. section 360bbb-3(b)(1), unless the authorization is terminated or revoked.     Resp Syncytial Virus by PCR NEGATIVE NEGATIVE Final    Comment: (NOTE) Fact Sheet for Patients: EntrepreneurPulse.com.au  Fact Sheet for Healthcare Providers: IncredibleEmployment.be  This test is not yet approved or cleared by the [place] FDA and has been authorized for detection and/or diagnosis of SARS-CoV-2 by FDA under an Emergency Use Authorization (EUA). This EUA will remain in effect (meaning this test can be used) for the duration of the COVID-19 declaration under Section 564(b)(1) of the Act, 21 U.S.C. section 360bbb-3(b)(1), unless the authorization is terminated or revoked.  Performed at [facility], [place] [address] [place], [place] [address]  MRSA Next Gen by PCR, Nasal     Status: None   Collection Time: 10/09/22  1:36 PM   Specimen: Nasal Mucosa; Nasal Swab  Result Value Ref Range Status   MRSA by PCR Next Gen NOT DETECTED NOT DETECTED Final    Comment: (NOTE) The GeneXpert MRSA Assay (FDA approved for NASAL specimens only), is one component of a comprehensive MRSA colonization surveillance program. It is not intended to diagnose MRSA infection nor to guide or monitor treatment for MRSA infections. Test performance is not FDA approved in patients less than 50 years old. Performed at [facility], [place] [address] [place], [place] [address]     FURTHER DISCHARGE INSTRUCTIONS:  Get Medicines reviewed and adjusted: Please take all your medications with you for your next visit with your Primary MD  Laboratory/radiological data: Please request your Primary MD to go over all hospital tests and procedure/radiological results at the follow up, please ask your Primary MD to get all Hospital records sent to his/her office.  In some cases, they will be blood work, cultures and biopsy  results pending at the time of your discharge. Please request that your primary care M.D. goes through all the records of your hospital data and follows up on these results.  Also Note the following: If you experience worsening of your admission symptoms, develop shortness of breath, life threatening emergency, suicidal or homicidal thoughts you must seek medical attention immediately by calling 911 or calling your MD immediately  if symptoms less severe.  You must read complete instructions/literature along with all the possible adverse reactions/side effects for all the Medicines you take and that have been prescribed to you. Take any new Medicines after you have completely understood and accpet all the possible adverse reactions/side effects.   Do not drive when taking Pain medications or sleeping medications (Benzodaizepines)  Do not take more than prescribed Pain, Sleep and Anxiety Medications. It is not advisable to combine anxiety,sleep and pain medications without talking with your primary care practitioner  Special Instructions: If you have smoked or chewed Tobacco  in the last 2 yrs please stop smoking, stop any regular Alcohol  and or any Recreational drug use.  Wear Seat belts while driving.  Please note: You were cared for by a hospitalist during your hospital stay. Once you are discharged, your primary care physician will handle any further medical issues. Please note that NO REFILLS for any discharge medications will be authorized once you are discharged, as it is imperative that you return to your primary care physician (or establish a relationship with a primary care physician if you do not have one) for your post hospital discharge needs so that they can reassess your need for medications and monitor your lab values.  Total Time spent coordinating discharge including counseling, education and face to face time equals greater than 30 minutes.  SignedOren Binet 10/18/2022 6:57 AM

## 2022-10-15 NOTE — Progress Notes (Signed)
[facility] Progress Note   Assessment/ Plan:   Dialysis Orders:  [facility] - per last admission  4hr 400/500   74.5 kg   2/2.5   Heparin 3K bolus - hectorol 12mg   Assessment/Plan: Acute hypoxic respiratory failure/volume overload - doubt wts are accurate. Good UF w/ most HD sessions here, euvolemic on exam. AHRF resolved, for dc today.  ESRD -  HD TTS. Had full treatment Tuesday. Extra UF 1/17, 1/18. Last HD 1/20.  Had HD this am.  Hypertension/volume  - Cont to lower dry wt as BP's tolerate Anemia  - Hb above goal. No ESA needs currently  Metabolic bone disease -  Ca/Phos ok. Continue home meds.  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    Inpatient medications:  arformoterol  15 mcg Nebulization BID   atorvastatin  40 mg Oral QHS   budesonide (PULMICORT) nebulizer solution  0.25 mg Nebulization BID   Chlorhexidine Gluconate Cloth  6 each Topical Q0600   Chlorhexidine Gluconate Cloth  6 each Topical Q0600   heparin  5,000 Units Subcutaneous Q8H   hydrocerin   Topical TID   ipratropium-albuterol  3 mL Nebulization TID   revefenacin  175 mcg Nebulization Daily    albuterol, white petrolatum       Subjective:    Seen in room.  No c/o's.   Objective:   BP 104/77 (BP Location: Right Arm)   Pulse 99   Temp 98.2 F (36.8 C) (Oral)   Resp 18   Ht '5\' 6"'$  (1.676 m)   Wt 78.9 kg   SpO2 97%   BMI 28.08 kg/m   Physical Exam: GUEA:[id number] O2, appears more comfortable CVS: RRR Resp: normal WOB now Abd: soft Ext: trace  LE edema, improved ACCESS: R IJ TDC  Labs: BMET Recent Labs  Lab 10/10/2022 2240 10/09/22 0410 10/10/22 0921 10/11/22 0146 10/12/22 0840 10/13/22 0112 10/15/22 0852 10/15/22 1005  NA 131* 129* 128* 131* 128* 129* 119* 126*  K 4.1 4.0 4.2 4.4 3.6 4.1 5.3* 3.9  CL 92* 89* 90* 91* 89* 93* 84* 89*  CO2 '25 27 23 25 25 22 22 26  '$ GLUCOSE 83 141* 221* 145* 188* 208* 120* 99  BUN 16 19 35* 29* 51* 34* 63* 35*  CREATININE 3.47* 3.94* 5.23* 3.64* 4.82* 3.70* 5.98* 3.78*  CALCIUM 7.7* 7.7* 7.7* 8.1* 7.8* 8.1* 8.2* 8.2*  PHOS 2.5 2.7 4.1 3.4 3.5 2.4* 4.7*  --     CBC Recent Labs  Lab 10/13/2022 1747 10/06/2022 1804 10/10/22 0921 10/11/22 0146 10/12/22 0108 10/15/22 0852  WBC 5.5   < > 5.9 9.1 10.8* 8.8  NEUTROABS 4.0  --   --   --   --   --   HGB 13.1   < > 11.4*  12.2* 12.5* 12.4*  HCT 38.4*   < > 34.9* 36.8* 38.6* 36.3*  MCV 97.5   < > 99.1 97.9 99.2 96.0  PLT 201   < > 134* 147* 115* 129*   < > = values in this interval not displayed.       Medications:     arformoterol  15 mcg Nebulization BID   atorvastatin  40 mg Oral QHS   budesonide (PULMICORT) nebulizer solution  0.25 mg Nebulization BID   Chlorhexidine Gluconate Cloth  6 each Topical Q0600   Chlorhexidine Gluconate Cloth  6 each Topical Q0600   heparin  5,000 Units Subcutaneous Q8H   hydrocerin   Topical TID   ipratropium-albuterol  3 mL Nebulization TID   revefenacin  175 mcg Nebulization Daily     Madelon Lips MD 10/15/2022, 1:46 PM

## 2022-10-15 NOTE — Progress Notes (Signed)
Pt received in bed no c/os no distress noted stable for HD via [place]

## 2022-10-16 ENCOUNTER — Inpatient Hospital Stay (HOSPITAL_COMMUNITY): Payer: No Typology Code available for payment source

## 2022-10-16 ENCOUNTER — Encounter (HOSPITAL_COMMUNITY): Payer: Self-pay | Admitting: Internal Medicine

## 2022-10-16 ENCOUNTER — Encounter (HOSPITAL_COMMUNITY): Admission: EM | Disposition: E | Payer: Self-pay | Source: Home / Self Care | Attending: Internal Medicine

## 2022-10-16 DIAGNOSIS — N186 End stage renal disease: Secondary | ICD-10-CM | POA: Diagnosis not present

## 2022-10-16 DIAGNOSIS — I5043 Acute on chronic combined systolic (congestive) and diastolic (congestive) heart failure: Secondary | ICD-10-CM | POA: Diagnosis not present

## 2022-10-16 DIAGNOSIS — J90 Pleural effusion, not elsewhere classified: Secondary | ICD-10-CM | POA: Diagnosis not present

## 2022-10-16 DIAGNOSIS — J189 Pneumonia, unspecified organism: Secondary | ICD-10-CM | POA: Diagnosis not present

## 2022-10-16 DIAGNOSIS — F1410 Cocaine abuse, uncomplicated: Secondary | ICD-10-CM | POA: Diagnosis not present

## 2022-10-16 DIAGNOSIS — R0603 Acute respiratory distress: Secondary | ICD-10-CM | POA: Diagnosis not present

## 2022-10-16 DIAGNOSIS — Z992 Dependence on renal dialysis: Secondary | ICD-10-CM | POA: Diagnosis not present

## 2022-10-16 DIAGNOSIS — I4892 Unspecified atrial flutter: Secondary | ICD-10-CM

## 2022-10-16 HISTORY — PX: THORACENTESIS: SHX235

## 2022-10-16 LAB — RENAL FUNCTION PANEL
Albumin: 3.6 g/dL (ref 3.5–5.0)
Anion gap: 16 — ABNORMAL HIGH (ref 5–15)
BUN: 53 mg/dL — ABNORMAL HIGH (ref 8–23)
CO2: 22 mmol/L (ref 22–32)
Calcium: 8.2 mg/dL — ABNORMAL LOW (ref 8.9–10.3)
Chloride: 90 mmol/L — ABNORMAL LOW (ref 98–111)
Creatinine, Ser: 4.76 mg/dL — ABNORMAL HIGH (ref 0.61–1.24)
GFR, Estimated: 12 mL/min — ABNORMAL LOW (ref >60)
Glucose, Bld: 113 mg/dL — ABNORMAL HIGH (ref 70–99)
Phosphorus: 4.3 mg/dL (ref 2.5–4.6)
Potassium: 4.7 mmol/L (ref 3.5–5.1)
Sodium: 128 mmol/L — ABNORMAL LOW (ref 135–145)

## 2022-10-16 LAB — ECHOCARDIOGRAM COMPLETE
AR max vel: 1.68 cm2
AV Area VTI: 1.44 cm2
AV Area mean vel: 1.56 cm2
AV Mean grad: 3 mmHg
AV Peak grad: 5.4 mmHg
Ao pk vel: 1.16 m/s
Area-P 1/2: 4.8 cm2
Calc EF: 7.6 %
Est EF: <20
Height: 66 in
MV M vel: 3.63 m/s
MV Peak grad: 52.7 mmHg
Radius: 0.6 cm
S' Lateral: 5.3 cm
Single Plane A2C EF: 3.1 %
Single Plane A4C EF: 6.3 %
Weight: 2751.34 oz

## 2022-10-16 LAB — MAGNESIUM: Magnesium: 1.7 mg/dL (ref 1.7–2.4)

## 2022-10-16 SURGERY — THORACENTESIS
Anesthesia: LOCAL

## 2022-10-16 MED ORDER — PERFLUTREN LIPID MICROSPHERE
1.0000 mL | INTRAVENOUS | Status: AC | PRN
  Administered 2022-10-16: 2 mL via INTRAVENOUS

## 2022-10-16 MED ORDER — IPRATROPIUM-ALBUTEROL 0.5-2.5 (3) MG/3ML IN SOLN: 3.0000 mL | Freq: Three times a day (TID) | RESPIRATORY_TRACT | Status: AC

## 2022-10-16 MED ORDER — METOPROLOL SUCCINATE ER 25 MG PO TB24
12.5000 mg | ORAL_TABLET | Freq: Every day | ORAL | Status: DC
  Administered 2022-10-16: 12.5 mg via ORAL
  Filled 2022-10-16: qty 1

## 2022-10-16 MED ORDER — METOPROLOL SUCCINATE ER 25 MG PO TB24
25.0000 mg | ORAL_TABLET | Freq: Every day | ORAL | Status: DC
  Administered 2022-10-17 – 2022-10-20 (×3): 25 mg via ORAL
  Filled 2022-10-16 (×4): qty 1

## 2022-10-16 MED ORDER — CHLORHEXIDINE GLUCONATE CLOTH 2 % EX PADS: 6.0000 | MEDICATED_PAD | Freq: Every day | CUTANEOUS | Status: DC

## 2022-10-16 NOTE — Consult Note (Addendum)
Cardiology Consultation   Patient ID: [name]. MRN: [id number]; DOB: [birth year]  Admit date: 10/13/2022 Date of Consult: 10/16/2022  PCP:  [name], [place] Providers Cardiologist:  [facility]   {  Patient Profile:   [name]. is a 82 y.o. 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 Sloan Leiter.  History of Present Illness:   [name] is a 82 year old male with the above history. He is followed by Cardiology at the [facility]. He has a history of CAD s/p DES to LAD in 2012. He was admitted at [facility] in 03/2019 for further evaluation of progressive shortness of breath. Echo showed LVEF of 30-35% with diffuse hypokinesis and a moderately sized vegetation on the tricuspid valve which was ultimately felt to be artifact. He was not on dialysis at that point so Myoview was ordered for further evaluation of cardiomyopathy and showed a moderate sized area of anterolateral wall ischemia. He ultimately underwent LHC which showed diffusely disease LAD with moderate proximal stenosis and moderate in-stent restenosis of the mid vessel as well as mild RCA disease with severe stenosis in the mid/distal PDA. Medical therapy was recommended. He was also started on dialysis that admission. Sounds like there have been issues with medication non-compliance in the past. He was admitted to [facility] in 05/2022 for shortness of breath. Pro-BNP >70,000 at taht time and chest CT showed cardiomegaly with  bilateral pleural effusion (right > left) and bibasilar lung atelectasis. He underwent a right sided thoracentesis at that time with removal of 1,400 of transudative fluid. Echo that admission showed LVEF of 15-20% with diffuse hypokinesis, severely dilated RV with reduced systolic function, severe biatrial enlargement, moderate MR, severe TR, and PFO with right to left shunting. Volume status was managed with dialysis. BP was tenuous that admission. He was previously on Losartan at home but when this was restarted he developed hyperkalemia so he was discharged only on Coreg.  Patient presented to the ED on 10/11/2022 via EMS for further evaluation of shortness of breath. He was noted to be in acute respiratory distress upon arrival of EMS and was treated with Duo Neb and CPAP with improvement in symptoms. BNP was elevated at >4,500 (looks like it is chronically at this level dating back to 06/2021). Chest x-ray showed persistent moderate right pleural effusion and right basilar atelectasis/ consolidation. Acute respiratory failure was felt to be secondary to possible pneumonia, right pleural effusion, and COPD exacerbation. He was started on antibiotics, bronchodilators, and steroids. He was also felt to have a degree of volume overload which was managed with dialysis. He intiially improved with this treatment and he was initially going to be discharged to a SNF on 10/15/2022 but this was delayed due to no bed availability. Today, he was noted to have worsening shortness of breath and had to be placed back on BiPAP. Repeat chest x-ray today showed small to moderate right pleural effusion and bibasilar atelectasis (unchanged from prior). Cardiology was consulted  for further evaluation.  At the time of this evaluation, patient now off BiPAP and make on room air. Patient reports worsening dyspnea on exertion for about a month prior to admission.  He denies having any significant shortness of breath at rest prior to  admission but based off chart review it sounds like he was at rest.  He also has some noticeably labored breathing while to talking to me.  He has chronic orthopnea and states he sleeps on about a 30-45 degree angle which she has done for long time. He denies recent PND. He does have chronic lower extremity edema and states it has been worse over the last 3 months he denies missing any dialysis sessions and states he is compliant with all his medications.  However, he is not compliant with a low-sodium diet.  He states he eats a lot of microwavable meals.  He denies any chest pain prior to this admission.  He does states he had 1 episode of chest pain this morning which he thinks was due to something he ate.  It improved after he had a bowel movement.  Seem to be an isolated episode.  He states denies any palpitations, lightheadedness, dizziness, syncope. He denies any recent or illnesses prior to this admission.  No nausea, vomiting, diarrhea.  No abnormal bleeding in urine or stools.  He has a long smoking history (at least 20 years) and currently smokes 1/2 pack per day. He also has a history of cocaine use. He states he last used cocaine a couple of months ago.   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[place])    Obesity    Prostate cancer ([place])    Tobacco abuse counseling     Past Surgical History:  Procedure Laterality Date   AV FISTULA PLACEMENT Left 04/26/2019   Procedure: INSERTION OF ARTERIOVENOUS (AV) GORE-TEX GRAFT ARM - using [employer] [facility] Graft size 45cm;  Surgeon: Rosetta Posner, MD;  Location: [facility] OR;  Service: Vascular;  Laterality: Left;   AV FISTULA PLACEMENT Left 09/18/2022   Procedure: INSERTION OF LEFT ARM ARTERIOVENOUS (AV) GORE-TEX  GRAFT;  Surgeon: Serafina Mitchell, MD;  Location: [place];  Service: Vascular;  Laterality: Left;  with regional block   CARDIAC CATHETERIZATION  02/08/02   CARDIAC CATHETERIZATION  11/20/10   drug eluting stent;mid left LAD, There is mild hypokinesis of the anterolateral wall. LVEF  is estimated at 50-55%   INSERTION OF BRONCHIAL STENT     went in groin to R chest   INSERTION OF DIALYSIS CATHETER Right 04/26/2019   Procedure: INSERTION OF HEMODIALYSIS CATHETER - using Palindrome Catheter size 23cm;  Surgeon: Rosetta Posner, MD;  Location: [place];  Service: Vascular;  Laterality: Right;   LEFT HEART CATH AND CORONARY ANGIOGRAPHY N/A 04/23/2019   Procedure: LEFT HEART CATH AND CORONARY ANGIOGRAPHY;  Surgeon: Sherren Mocha, MD;  Location: [place] [facility];  Service: Cardiovascular;  Laterality: N/A;   PENILE PROSTHESIS IMPLANT     '05   THORACENTESIS Right 04/10/2022   Procedure: Mathews Robinsons;  Surgeon: Lanier Clam, MD;  Location: [facility] ENDOSCOPY;  Service: Pulmonary;  Laterality: Right;     Home Medications:  Prior  to Admission medications   Medication Sig Start Date End Date Taking? Authorizing Provider  albuterol (VENTOLIN HFA) 108 (90 Base) MCG/ACT inhaler Inhale 2 puffs into the lungs every 4 (four) hours as needed for shortness of breath. Patient taking differently: Inhale 2 puffs into the lungs every 2 (two) hours as needed for shortness of breath or wheezing. 02/06/22  Yes Hunsucker, Bonna Gains, MD  aspirin EC 81 MG tablet Take 81 mg by mouth daily. Swallow whole.   Yes [provider]  budesonide-formoterol (SYMBICORT) 160-4.5 MCG/ACT inhaler Inhale 2 puffs into the lungs 2 (two) times daily. 10/15/22  Yes Ghimire, Henreitta Leber, MD  cinacalcet (SENSIPAR) 30 MG tablet Take 15 mg by mouth See admin instructions. Take 15 mg by mouth every Tuesday, Thursday, and Saturday 04/09/22  Yes [provider]  HYDROPHILIC EX Apply 1 application  topically See admin instructions. Apply  a sufficient amount of cream to affected areas 2 times a day   Yes [provider]  isosorbide-hydrALAZINE (BIDIL) 20-37.5 MG tablet Take 1 tablet by mouth 2 (two) times daily.   Yes [provider]  metoprolol succinate (TOPROL-XL) 25 MG 24 hr tablet Take 12.5 mg by mouth daily. 06/18/22  Yes [provider]  tiotropium (SPIRIVA HANDIHALER) 18 MCG inhalation capsule Place 1 capsule (18 mcg total) into inhaler and inhale daily. 10/15/22 10/15/23 Yes Ghimire, Henreitta Leber, MD  albuterol (PROVENTIL) (2.5 MG/3ML) 0.083% nebulizer solution Take 3 mLs (2.5 mg total) by nebulization every 2 (two) hours as needed for wheezing or shortness of breath. 10/15/22   Ghimire, Henreitta Leber, MD  atorvastatin (LIPITOR) 40 MG tablet Take 40 mg by mouth at bedtime. Patient not taking: Reported on 10/10/2022    [provider]  hydrocerin (EUCERIN) CREA Apply 1 Application topically 3 (three) times daily. 10/15/22   Ghimire, Henreitta Leber, MD  ipratropium-albuterol (DUONEB) 0.5-2.5 (3) MG/3ML SOLN Take 3 mLs by nebulization 3 (three) times daily. 10/16/22   Ghimire, Henreitta Leber, MD  oxyCODONE-acetaminophen (PERCOCET) 5-325 MG tablet Take 1 tablet by mouth every 6 (six) hours as needed for severe pain. Patient not taking: Reported on 10/10/2022 09/18/22   Gabriel Earing, PA-C  Tiotropium Bromide-Olodaterol (STIOLTO RESPIMAT) 2.5-2.5 MCG/ACT AERS Inhale 2 puffs into the lungs daily. Patient taking differently: Inhale 2 puffs into the lungs 2 (two) times daily. 04/04/22   Hunsucker, Bonna Gains, MD  white petrolatum (VASELINE) OINT Apply 1 Application topically as needed for lip care or dry skin. 10/15/22   Ghimire, Henreitta Leber, MD    Inpatient Medications: Scheduled Meds:  [MAR Hold] arformoterol  15 mcg Nebulization BID   [MAR Hold] atorvastatin  40 mg Oral QHS   [MAR Hold] budesonide (PULMICORT) nebulizer solution  0.25 mg Nebulization BID   [MAR Hold] Chlorhexidine Gluconate Cloth  6 each Topical  Q0600   [MAR Hold] heparin  5,000 Units Subcutaneous Q8H   [MAR Hold] hydrocerin   Topical TID   [MAR Hold] ipratropium-albuterol  3 mL Nebulization TID   [MAR Hold] metoprolol succinate  12.5 mg Oral Daily   [MAR Hold] revefenacin  175 mcg Nebulization Daily   Continuous Infusions:  PRN Meds: [MAR Hold] albuterol, [MAR Hold] white petrolatum  Allergies:   No Known Allergies  Social History:   Social History   Socioeconomic History   Marital status: Married    Spouse name: Collie Siad   Number of children: 3   Years of education: Not on file   Highest education level: Not on file  Occupational History   Occupation: retired  Tobacco Use   Smoking status: Former    Packs/day: 0.25    Years: 50.00    Total pack years: 12.50    Types: Cigarettes    Passive exposure: Never   Smokeless tobacco: Never   Tobacco comments:    3 cigs a day-05/02/2021  Vaping Use   Vaping Use: Never used  Substance and Sexual Activity   Alcohol use: Not Currently    Comment: drank 5 beers 03-28-17   Drug use: Not Currently    Types: Cocaine, Marijuana   Sexual activity: Yes  Other Topics Concern   Not on file  Social History Narrative   Not on file   Social Determinants of Health   Financial Resource Strain: Not on file  Food Insecurity: No Food Insecurity (10/10/2022)   Hunger Vital Sign    Worried About Running Out of Food in the Last Year: Never true    Ran Out of Food in the Last Year: Never true  Transportation Needs: No Transportation Needs (10/10/2022)   PRAPARE - Hydrologist (Medical): No    Lack of Transportation (Non-Medical): No  Physical Activity: Not on file  Stress: Not on file  Social Connections: Not on file  Intimate Partner Violence: Not At Risk (10/10/2022)   Humiliation, Afraid, Rape, and Kick questionnaire    Fear of Current or Ex-Partner: No    Emotionally Abused: No    Physically Abused: No    Sexually Abused: No    Family History:    Family History  Problem Relation Age of Onset   Aneurysm Mother    Multiple myeloma Mother    Aneurysm Father    Cancer Brother    Prostate cancer Brother    Cancer Other    Prostate cancer Brother    Bone cancer Brother    Breast cancer Neg Hx    Colon cancer Neg Hx    Pancreatic cancer Neg Hx      ROS:  Please see the history of present illness.  Review of Systems  Constitutional:  Negative for fever.  HENT:  Negative for congestion.   Respiratory:  Positive for shortness of breath. Negative for cough.   Cardiovascular:  Positive for chest pain, orthopnea and leg swelling. Negative for palpitations and PND.  Gastrointestinal:  Negative for blood in stool, melena, nausea and vomiting.  Genitourinary:  Negative for hematuria.  Musculoskeletal:  Negative for myalgias.  Neurological:  Negative for dizziness and loss of consciousness.  Endo/Heme/Allergies:  Does not bruise/bleed easily.  Psychiatric/Behavioral:  Positive for substance abuse.     Physical Exam/Data:   Vitals:   10/16/22 0733 10/16/22 0806 10/16/22 1300 10/16/22 1306  BP: (!) 128/96 (!) 128/96 112/80   Pulse:   92   Resp: 20     Temp:  (!) 97.5 F (36.4 C)    TempSrc:  Oral    SpO2: 95% 98% 96%   Weight:    78 kg  Height:    '5\' 6"'$  (1.676 m)    Intake/Output Summary (Last 24 hours) at 10/16/2022 1330 Last data filed at 10/16/2022 1300 Gross per 24 hour  Intake 240 ml  Output --  Net 240 ml      10/16/2022    1:06 PM 10/16/2022    3:31 AM 10/15/2022    8:22 AM  Last 3 Weights  Weight (lbs) 171 lb 15.3 oz 171 lb 15.3 oz 173 lb 15.1  oz  Weight (kg) 78 kg 78 kg 78.9 kg     Body mass index is 27.75 kg/m.  General: 82 y.o. African-American male resting comfortably in no acute distress. HEENT: Normocephalic and atraumatic. EOMs intact. Neck: Supple. Prominent neck veins. Heart: RRR. II/VI murmur best heard at apex. No gallops or rubs. Lungs: Mild labored breathing while talking. Decreased breath  sound in right base. Faint crackles in left base. Faint scattered wheezing noted occasionally. Abdomen: Soft, distended, and non-tender.  Extremities: Trace to 1+ pitting edema of bilateral lower extremities.    Skin: Warm and dry. Neuro: Alert and oriented x3. No focal deficits. Psych: Normal affect. Responds appropriately.  EKG:  The EKG was personally reviewed and demonstrates:  Atrial flutter, rate 93 bpm, with RBBB and PVC. No acute ST/T changes.  Telemetry:  Telemetry was personally reviewed and demonstrates:  Atrial flutter with rates in the 90s to low 100s.  Relevant CV Studies:  Left Cardiac Catheterization 04/21/2019: Impressions: 1. Mild RCA stenosis with severe stenosis in the mid/distal PDA (appropriate for medical therapy with small rea of myocardium supplied) 2. Widely patent left main 3. Diffusely diseased LAD with moderate proximal stenosis and moderate in-stent restenosis in the mid-vessel 4. Widely patent left circumflex 5. Moderately elevated LVEDP   Recommend: medical therapy for CAD and CHF. Cardiomyopathy out of proportion to extent of CAD.  Diagnostic Dominance: Right   _______________  Echocardiogram 05/31/2022 ([facility]): - Left ventricle: Mildly dilated chamber with normal wall thickness. Severe diffuse  hypokinesis with LVEF 15-20%.   Abnormal diastolic function.  - Right ventricle: Severely dilated with reduced systolic function.    - Left atrium: Severely dilated.  Saline contrast injection suggests the presence of a  patent foramen ovale with right to left shunting.  - Right atrium: Severely dilated. The interatrial septum bows to the left suggesting  elevated right atrial pressure. Estimated RA pressure 15 mm Hg (IVC diameter >2.1 cm with inspiratory collapse <50%)  - Mitral valve: Normal structure with moderate regurgitation.    - Tricuspid valve: Poor leaflet coaptation resulting in severe regurgitation.   - Unable to accurately assess RVSP.     -  Aortic valve: Trileaflet.  Moderate sclerosis with normal cusp excursion. No stenosis. Trace regurgitation.   - Pulmonic valve: Normal structure with mild regurgitation.    - Ascending aorta: Normal aortic root diameter.  - Pericardium: No pericardial effusion.    Laboratory Data:  High Sensitivity Troponin:   Recent Labs  Lab 09/27/2022 1747  TROPONINIHS 45*     Chemistry Recent Labs  Lab 10/10/22 0921 10/11/22 0146 10/12/22 0840 10/15/22 0852 10/15/22 1005 10/16/22 1152  NA 128* 131*   < > 119* 126* 128*  K 4.2 4.4   < > 5.3* 3.9 4.7  CL 90* 91*   < > 84* 89* 90*  CO2 23 25   < > '22 26 22  '$ GLUCOSE 221* 145*   < > 120* 99 113*  BUN 35* 29*   < > 63* 35* 53*  CREATININE 5.23* 3.64*   < > 5.98* 3.78* 4.76*  CALCIUM 7.7* 8.1*   < > 8.2* 8.2* 8.2*  MG 2.2 1.8  --   --   --  1.7  GFRNONAA 10* 16*   < > 9* 15* 12*  ANIONGAP 15 15   < > 13 11 16*   < > = values in this interval not displayed.    Recent Labs  Lab 10/13/22 0112 10/15/22 8182  10/16/22 1152  ALBUMIN 3.2* 3.5 3.6   Lipids No results for input(s): "CHOL", "TRIG", "HDL", "LABVLDL", "LDLCALC", "CHOLHDL" in the last 168 hours.  Hematology Recent Labs  Lab 10/11/22 0146 10/12/22 0108 10/15/22 0852  WBC 9.1 10.8* 8.8  RBC 3.76* 3.89* 3.78*  HGB 12.2* 12.5* 12.4*  HCT 36.8* 38.6* 36.3*  MCV 97.9 99.2 96.0  MCH 32.4 32.1 32.8  MCHC 33.2 32.4 34.2  RDW 15.1 15.2 14.7  PLT 147* 115* 129*   Thyroid No results for input(s): "TSH", "FREET4" in the last 168 hours.  BNPNo results for input(s): "BNP", "PROBNP" in the last 168 hours.  DDimer No results for input(s): "DDIMER" in the last 168 hours.   Radiology/Studies:  [facility] Chest Port 1V same Day  Result Date: 10/16/2022 CLINICAL DATA:  Shortness of breath EXAM: PORTABLE CHEST 1 VIEW COMPARISON:  Chest x-ray dated October 15, 2022 FINDINGS: Unchanged cardiomegaly. Right chest wall central venous catheter is unchanged in position. Unchanged small to moderate right  pleural effusion. Bibasilar atelectasis. No evidence of pneumothorax. IMPRESSION: Small to moderate right pleural effusion and bibasilar atelectasis, unchanged when compared with the prior. Electronically Signed   By: Yetta Glassman M.D.   On: 10/16/2022 09:25   DG CHEST PORT 1 VIEW  Result Date: 10/15/2022 CLINICAL DATA:  Shortness of breath EXAM: PORTABLE CHEST 1 VIEW COMPARISON:  10/10/2022 and older FINDINGS: Underinflation. Enlarged cardiopericardial silhouette. There is a moderate right effusion. No pneumothorax. Vascular congestion. Tortuous ectatic aorta. There is a right IJ double-lumen catheter with tip at the SVC right atrial junction. The left inferior costophrenic angle is clipped at the edge of the film. Film is under penetrated. IMPRESSION: 1. Cardiomegaly with increasing vascular congestion and moderate right effusion. 2. Right IJ double-lumen catheter with tip at the SVC right atrial junction. Electronically Signed   By: Jill Side M.D.   On: 10/15/2022 14:18     Assessment and Plan:   Acute on Chronic Systolic CHF BiVentricular Failure Last Echo in 05/2022 at [facility] showed LVEF of 15-20% with diffuse hypokinesis, severely dilated RV with reduced systolic function, severe biatrial enlargement, moderate MR, severe TR, and PFO with right to left shunting. Patient was admitted with acute hypoxic respiratory secondary to pneumonia, right pleural effusion, acute CHF, and COPD exacerbation. BNP >4,500 chronically. Chest x-ray showed persistent moderate right pleural effusion with right basilar atelectasis/ consolidation. Initially required BiPAP. Symptoms initially improved with treated with antibiotics, bronchodilators, steroids, and dialysis for management of volume status. However, he has had worsening shortness of breath today. Repeat chest x-ray today showed small to moderate right pleural effusion and bibasilar atelectasis (unchanged from prior). - Still has evidence of volume  overload on exam. Weight 158 lbs on admission and 171 lbs today. - Repeat Echo pending.  - Volume status being managed with dialysis. - GDMT has been limited in the past due to soft BP. BP soft at times but stable. Will increase Toprol-XL to '25mg'$  daily. Will hold on any other GDMT for now. - Continue daily weights, strict I/Os, and renal function. - Unfortunately, I don't think there is much we can offer as far as his CHF goes. He is not a candidate for advanced therapies. Discussed the importance of complete cessation of cocaine use.  Chronic Right Pleural Effusion He has a history of chronic right pleural effusion s/p multiple thoracentesis. Last thoracentesis was in 05/2022 at [facility]. Chest x-ray today showed small to moderate right pleural effusion. - Continue dialysis as above. -  He is going for repeat thoracentesis later today.  CAD S/p DES to LAD in 2012. Last cath in 03/2019 showed showed diffusely disease LAD with moderate proximal stenosis and moderate in-stent restenosis of the mid vessel as well as mild RCA disease with severe stenosis in the mid/distal PDA. Medical therapy was recommended. EKG this admission shows no acute ischemic changes. High-sensitivity troponin 45 consistent with demand ischemia.  - He states he had one episode of chest pain earlier today that he thinks was do to something he ate. Improved after he had a bowel movement. This was an isolated event. He denies any other chest pain recently.  - He is not on Aspirin. Will not start this due to need for Eliquis. - Contineu beta-blocker and high-intensity statin. - No additional ischemic work-up necessary at this time.  New Onset Atrial Flutter  EKG on admission looks like atrial flutter with rates in the 90s (looks like flutter wave is hiding in the QRS). Telemetry also shows atrial flutter with rates in the 90s to low 100s.  - Will increase Toprol-XL to '25mg'$  daily. - CHA2DS2-VASc = 8 (CHF, CAD, HTN, DM, DVT x2, age  x2). Will need anticoagulation after thoracentesis. Would recommend Eliquis 2.'5mg'$  twice daily (reduced dose due to age and renal function).  Hypertension BP soft at times.  - Continue medications for CHF as above.  Hyperlipidemia - Continue Lipitor '40mg'$  daily. - Will repeat fasting lipid panel tomorrow.  ESRD  On hemodialysis Tuesday/Thursday/Saturday. - Management per Nephrology.  Otherwise, per primary team: - Acute hypoxic respiratory failure  - Pneumonia - COPD exacerbation - Type 2 diabetes mellitus  - Hyponatremia - Thrombocytopenia - Anemia of chronic disease - History of polysubstance abuse   Risk Assessment/Risk Scores:      New York Heart Association (NYHA) Functional Class NYHA Class III-IV   CHA2DS2-VASc Score = 8  This indicates a 10.8% annual risk of stroke. The patient's score is based upon: CHF History: 1 HTN History: 1 Diabetes History: 1 Stroke History: 2 (VTE - prior DVT) Vascular Disease History: 1 Age Score: 2 Gender Score: 0     For questions or updates, please contact [place] Please consult [email or web address] for contact info under    Signed, Darreld Mclean, PA-C  10/16/2022 1:30 PM

## 2022-10-16 NOTE — Progress Notes (Addendum)
PROGRESS NOTE        PATIENT DETAILS Name: [name]. Age: 82 y.o. Sex: male Date of Birth: 05-04-1941 Admit Date: 10/16/2022 Admitting Physician Kayleen Memos, DO WNI:OEVOJJK, Dibas, MD  Brief Summary: Patient is a 82 y.o.  male with history of ESRD on HD TTS, HFrEF, COPD, HTN, HLD who presented with SOB x 1 day.  Patient was found to have acute hypoxic respiratory failure-requiring BiPAP on initial presentation.  Significant events: 1/16>> SOB-hypoxia-requiring BiPAP.  Last HD on 1/16 as an outpatient. 1/24>>developed worsening SOB-non compliant with Fluid restrictions-nebs-BIPAP. PCCM/Cards consult  Significant studies: 1/16>> CXR: Moderate right pleural effusion-right basilar consolidation  Significant microbiology data: 1/16>> COVID/influenza/RSV PCR: Negative  Procedures: None  Consults: Nephrology  Subjective: Complaining of shortness of breath today.  Slightly tachypneic but comfortable-remains on room air.  Objective: Vitals: Blood pressure (!) 128/96, pulse 99, temperature (!) 97.5 F (36.4 C), temperature source Oral, resp. rate 18, height '5\' 6"'$  (1.676 m), weight 78 kg, SpO2 98 %.   Exam: Gen Exam:Alert awake-not in any distress HEENT:atraumatic, normocephalic Chest: Moving air-mildly decreased air entry on the right side.  Some scattered rhonchi. CVS:S1S2 regular Abdomen:soft non tender, non distended Extremities:no edema Neurology: Non focal Skin: no rash  Pertinent Labs/Radiology:    Latest Ref Rng & Units 10/15/2022    8:52 AM 10/12/2022    1:08 AM 10/11/2022    1:46 AM  CBC  WBC 4.0 - 10.5 K/uL 8.8  10.8  9.1   Hemoglobin 13.0 - 17.0 g/dL 12.4  12.5  12.2   Hematocrit 39.0 - 52.0 % 36.3  38.6  36.8   Platelets 150 - 400 K/uL 129  115  147     Lab Results  Component Value Date   NA 126 (L) 10/15/2022   K 3.9 10/15/2022   CL 89 (L) 10/15/2022   CO2 26 10/15/2022      Assessment/Plan: Acute hypoxic  respiratory failure-multifactorial etiology-right lobar pneumonia/pleural effusion/COPD exacerbation and some amount of volume overload in the setting of ESRD/HFrEF Although overall better after treatment of underlying etiologies-has redeveloped some shortness of breath today  His volume status is stable clinically, he has completed a course of Rocephin/Zithromax/steroids Plans were for potential discharge to SNF today-but given SOB-will hold and check CXR/echo.    Chronic right pleural effusion Reviewed chart-prior notes-appears to have a chronic right pleural effusion-that has been tapped in the past (has undergone T thoracentesis September and November 2023-transudate) this is felt to be due to HFrEF and ESRD. Given overall improvement with dialysis/antibiotics/tapering steroids-he was not felt to require a thoracocentesis. However he is now more short of breath this morning-in spite of numerous HD and being below dry weight, will touch base with PCCM to see if thoracocentesis will relieve some of his symptoms.    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 Given shortness of breath-recheck echo today Back on  beta-blocker. If BP tolerates-will restart losartan Reviewed prior cardiology notes during his prior hospitalizations at other institutions-he was not thought to be a good candidate for GDMT meds due to soft blood pressure He apparently follows with cardiology at  the [facility] center-he does not appear to be very compliant with follow-up per chart review. Given ongoing issues with shortness of breath-in spite of aggressive HD to lower dry weight-will consult cardiology. Given lack of improvement-he may have very advanced HF at this point.   ESRD on HD TTS Nephrology following  Hyponatremia Mild-asymptomatic This is clearly due to noncompliance to fluid restriction in the setting of HFrEF/ESRD Should improve with ongoing HD/compliance with fluid restriction  Hypomagnesemia Repleted  HTN BP currently soft in the low 096G systolic Resume losartan when able Continue metoprolol  HLD Statin  Thrombocytopenia Mild Seems to be a chronic issue ongoing for the past several years. Repeat CBC periodically.  Desquamation of skin of bilateral lower extremities eucerin ointment Overall much improved over the past several days.  History of polysubstance abuse (heroin/cocaine/marijuana) Claims he has been clean for approximately 5 years.  However UDS positive for cocaine as of March 2023  Addendum Acknowledges cocaine use-last use several weeks back  BMI: Estimated body mass index is 27.75 kg/m as calculated from the following:   Height as of this encounter: '5\' 6"'$  (1.676 m).   Weight as of this encounter: 78 kg.   Code status:   Code Status: Full Code   DVT Prophylaxis: heparin injection 5,000 Units Start: 10/09/2022 2200   Family Communication: None at bedside   Disposition Plan: Status is: Inpatient Remains inpatient appropriate because: Severity of illness.   Planned Discharge Destination:SNF when bed available.  Medically stable for transfer to SNF when bed available.   Diet: Diet Order             Diet renal with fluid restriction Fluid restriction: Other (see comments); Room service appropriate? Yes; Fluid consistency: Thin  Diet effective now           Diet - low sodium heart healthy                     Antimicrobial  agents: Anti-infectives (From admission, onward)    Start     Dose/Rate Route Frequency Ordered Stop   10/12/22 1700  azithromycin (ZITHROMAX) tablet 500 mg        500 mg Oral  Once 10/12/22 1358 10/12/22 1520   10/10/22 1700  azithromycin (ZITHROMAX) 500 mg in sodium chloride 0.9 % 250 mL IVPB  Status:  Discontinued        500 mg 250 mL/hr over 60 Minutes Intravenous Every 24 hours 10/10/22 1429 10/12/22 1358   10/10/22 1200  vancomycin (VANCOREADY) IVPB 750 mg/150 mL  Status:  Discontinued        750 mg 150 mL/hr over 60 Minutes Intravenous Every T-Th-Sa (Hemodialysis) 10/11/2022 2036 10/10/22 0650   10/10/22 0800  cefTRIAXone (ROCEPHIN) 2 g in sodium chloride 0.9 % 100 mL IVPB        2 g 200 mL/hr over 30 Minutes Intravenous Every 24 hours 10/10/22 0650 10/13/22 0925   10/10/22 0800  azithromycin (ZITHROMAX) 500 mg in sodium chloride 0.9 % 250 mL IVPB  Status:  Discontinued        500 mg 250 mL/hr over 60 Minutes Intravenous Every 24 hours 10/10/22 0650 10/10/22 1431   10/09/22 1300  vancomycin (VANCOREADY) IVPB 750 mg/150 mL  750 mg 150 mL/hr over 60 Minutes Intravenous  Once 10/09/22 1219 10/09/22 1434   10/01/2022 2045  ceFEPIme (MAXIPIME) 1 g in sodium chloride 0.9 % 100 mL IVPB  Status:  Discontinued        1 g 200 mL/hr over 30 Minutes Intravenous Daily at bedtime 10/02/2022 2036 10/10/22 0650   10/22/2022 2045  vancomycin (VANCOREADY) IVPB 1500 mg/300 mL        1,500 mg 150 mL/hr over 120 Minutes Intravenous NOW 09/26/2022 2036 10/09/22 0058        MEDICATIONS: Scheduled Meds:  arformoterol  15 mcg Nebulization BID   atorvastatin  40 mg Oral QHS   budesonide (PULMICORT) nebulizer solution  0.25 mg Nebulization BID   Chlorhexidine Gluconate Cloth  6 each Topical Q0600   heparin  5,000 Units Subcutaneous Q8H   hydrocerin   Topical TID   ipratropium-albuterol  3 mL Nebulization TID   revefenacin  175 mcg Nebulization Daily   Continuous Infusions:   PRN  Meds:.albuterol, white petrolatum   I have personally reviewed following labs and imaging studies  LABORATORY DATA: CBC: Recent Labs  Lab 10/10/22 0921 10/11/22 0146 10/12/22 0108 10/15/22 0852  WBC 5.9 9.1 10.8* 8.8  HGB 11.4* 12.2* 12.5* 12.4*  HCT 34.9* 36.8* 38.6* 36.3*  MCV 99.1 97.9 99.2 96.0  PLT 134* 147* 115* 129*     Basic Metabolic Panel: Recent Labs  Lab 10/10/22 0921 10/11/22 0146 10/12/22 0840 10/13/22 0112 10/15/22 0852 10/15/22 1005  NA 128* 131* 128* 129* 119* 126*  K 4.2 4.4 3.6 4.1 5.3* 3.9  CL 90* 91* 89* 93* 84* 89*  CO2 '23 25 25 22 22 26  '$ GLUCOSE 221* 145* 188* 208* 120* 99  BUN 35* 29* 51* 34* 63* 35*  CREATININE 5.23* 3.64* 4.82* 3.70* 5.98* 3.78*  CALCIUM 7.7* 8.1* 7.8* 8.1* 8.2* 8.2*  MG 2.2 1.8  --   --   --   --   PHOS 4.1 3.4 3.5 2.4* 4.7*  --      GFR: Estimated Creatinine Clearance: 15.3 mL/min (A) (by C-G formula based on SCr of 3.78 mg/dL (H)).  Liver Function Tests: Recent Labs  Lab 10/10/22 0921 10/11/22 0146 10/12/22 0840 10/13/22 0112 10/15/22 0852  ALBUMIN 3.4* 3.4* 3.2* 3.2* 3.5    No results for input(s): "LIPASE", "AMYLASE" in the last 168 hours. No results for input(s): "AMMONIA" in the last 168 hours.  Coagulation Profile: No results for input(s): "INR", "PROTIME" in the last 168 hours.  Cardiac Enzymes: No results for input(s): "CKTOTAL", "CKMB", "CKMBINDEX", "TROPONINI" in the last 168 hours.  BNP (last 3 results) No results for input(s): "PROBNP" in the last 8760 hours.  Lipid Profile: No results for input(s): "CHOL", "HDL", "LDLCALC", "TRIG", "CHOLHDL", "LDLDIRECT" in the last 72 hours.  Thyroid Function Tests: No results for input(s): "TSH", "T4TOTAL", "FREET4", "T3FREE", "THYROIDAB" in the last 72 hours.  Anemia Panel: No results for input(s): "VITAMINB12", "FOLATE", "FERRITIN", "TIBC", "IRON", "RETICCTPCT" in the last 72 hours.  Urine analysis:    Component Value Date/Time   COLORURINE  YELLOW 03/15/2019 1644   APPEARANCEUR CLEAR 03/15/2019 1644   LABSPEC 1.013 03/15/2019 1644   PHURINE 5.0 03/15/2019 1644   GLUCOSEU 50 (A) 03/15/2019 1644   HGBUR SMALL (A) 03/15/2019 1644   BILIRUBINUR NEGATIVE 03/15/2019 1644   KETONESUR NEGATIVE 03/15/2019 1644   PROTEINUR >=300 (A) 03/15/2019 1644   NITRITE NEGATIVE 03/15/2019 1644   LEUKOCYTESUR NEGATIVE 03/15/2019 1644    Sepsis Labs: Lactic  Acid, Venous    Component Value Date/Time   LATICACIDVEN 1.6 06/25/2021 1707    MICROBIOLOGY: Recent Results (from the past 240 hour(s))  Resp panel by RT-PCR (RSV, Flu A&B, Covid) Anterior Nasal Swab     Status: None   Collection Time: 10/10/2022  6:46 PM   Specimen: Anterior Nasal Swab  Result Value Ref Range Status   SARS Coronavirus 2 by RT PCR NEGATIVE NEGATIVE Final    Comment: (NOTE) SARS-CoV-2 target nucleic acids are NOT DETECTED.  The SARS-CoV-2 RNA is generally detectable in upper respiratory specimens during the acute phase of infection. The lowest concentration of SARS-CoV-2 viral copies this assay can detect is 138 copies/mL. A negative result does not preclude SARS-Cov-2 infection and should not be used as the sole basis for treatment or other patient management decisions. A negative result may occur with  improper specimen collection/handling, submission of specimen other than nasopharyngeal swab, presence of viral mutation(s) within the areas targeted by this assay, and inadequate number of viral copies(<138 copies/mL). A negative result must be combined with clinical observations, patient history, and epidemiological information. The expected result is Negative.  Fact Sheet for Patients:  EntrepreneurPulse.com.au  Fact Sheet for Healthcare Providers:  IncredibleEmployment.be  This test is no t yet approved or cleared by the [place] FDA and  has been authorized for detection and/or diagnosis of SARS-CoV-2 by FDA  under an Emergency Use Authorization (EUA). This EUA will remain  in effect (meaning this test can be used) for the duration of the COVID-19 declaration under Section 564(b)(1) of the Act, 21 U.S.C.section 360bbb-3(b)(1), unless the authorization is terminated  or revoked sooner.       Influenza A by PCR NEGATIVE NEGATIVE Final   Influenza B by PCR NEGATIVE NEGATIVE Final    Comment: (NOTE) The Xpert Xpress SARS-CoV-2/FLU/RSV plus assay is intended as an aid in the diagnosis of influenza from Nasopharyngeal swab specimens and should not be used as a sole basis for treatment. Nasal washings and aspirates are unacceptable for Xpert Xpress SARS-CoV-2/FLU/RSV testing.  Fact Sheet for Patients: EntrepreneurPulse.com.au  Fact Sheet for Healthcare Providers: IncredibleEmployment.be  This test is not yet approved or cleared by the [place] FDA and has been authorized for detection and/or diagnosis of SARS-CoV-2 by FDA under an Emergency Use Authorization (EUA). This EUA will remain in effect (meaning this test can be used) for the duration of the COVID-19 declaration under Section 564(b)(1) of the Act, 21 U.S.C. section 360bbb-3(b)(1), unless the authorization is terminated or revoked.     Resp Syncytial Virus by PCR NEGATIVE NEGATIVE Final    Comment: (NOTE) Fact Sheet for Patients: EntrepreneurPulse.com.au  Fact Sheet for Healthcare Providers: IncredibleEmployment.be  This test is not yet approved or cleared by the [place] FDA and has been authorized for detection and/or diagnosis of SARS-CoV-2 by FDA under an Emergency Use Authorization (EUA). This EUA will remain in effect (meaning this test can be used) for the duration of the COVID-19 declaration under Section 564(b)(1) of the Act, 21 U.S.C. section 360bbb-3(b)(1), unless the authorization is terminated or revoked.  Performed at [facility], [place] [address], [place], [place] [address]   MRSA Next Gen by PCR, Nasal     Status: None   Collection Time: 10/09/22  1:36 PM   Specimen: Nasal Mucosa; Nasal Swab  Result Value Ref Range Status   MRSA by PCR Next Gen NOT DETECTED NOT DETECTED Final    Comment: (NOTE) The GeneXpert MRSA  Assay (FDA approved for NASAL specimens only), is one component of a comprehensive MRSA colonization surveillance program. It is not intended to diagnose MRSA infection nor to guide or monitor treatment for MRSA infections. Test performance is not FDA approved in patients less than 27 years old. Performed at [facility], [place] [address], [place], [place] [address]     RADIOLOGY STUDIES/RESULTS: DG CHEST PORT 1 VIEW  Result Date: 10/15/2022 CLINICAL DATA:  Shortness of breath EXAM: PORTABLE CHEST 1 VIEW COMPARISON:  10/10/2022 and older FINDINGS: Underinflation. Enlarged cardiopericardial silhouette. There is a moderate right effusion. No pneumothorax. Vascular congestion. Tortuous ectatic aorta. There is a right IJ double-lumen catheter with tip at the SVC right atrial junction. The left inferior costophrenic angle is clipped at the edge of the film. Film is under penetrated. IMPRESSION: 1. Cardiomegaly with increasing vascular congestion and moderate right effusion. 2. Right IJ double-lumen catheter with tip at the SVC right atrial junction. Electronically Signed   By: Jill Side M.D.   On: 10/15/2022 14:18     LOS: 8 days   Oren Binet, MD  [facility]    To contact the attending provider between 7A-7P or the covering provider during after hours 7P-7A, please log into the web site [email or web address] and access using universal [place] password for that web site. If you do not have the password, please call the hospital operator.  10/16/2022, 8:26 AM

## 2022-10-16 NOTE — Progress Notes (Signed)
Echocardiogram not complete, patient undergoing other testing at this time.  [name]

## 2022-10-16 NOTE — Progress Notes (Signed)
Pt had 8 beat run of V-tach at 16:57 per CCMD. Asymptomatic. Notified cardiology. No new order received.

## 2022-10-16 NOTE — Progress Notes (Signed)
[facility] Progress Note   Subjective:    Seen in room.  No c/o's. Had HD yest w/ 2 L UF. Weighed him standing at 75.7kg   Objective:   BP (!) 128/96 (BP Location: Right Arm)   Pulse 99   Temp (!) 97.5 F (36.4 C) (Oral)   Resp 20   Ht '5\' 6"'$  (1.676 m)   Wt 78 kg   SpO2 98%   BMI 27.75 kg/m   Physical Exam: DHW:YSHUOHF [place] O2, on RA, no distress CVS: RRR Resp: normal WOB now Abd: soft Ext: trace LE edema, improved ACCESS: R IJ TDC  Dialysis Orders:  [facility] - per last admission  4hr 400/500   74.5 kg   2/2.5   Heparin 3K bolus - hectorol 40mg    Assessment/Plan: Acute hypoxic respiratory failure/volume overload - sp HD x 4 here w/ total 10-11 total UF. Pt is euvolemic on exam, persistent R effusion by CXR. Remains up 1-2kg. Will try larger UF w/ next HD tomorrow. Needs to limit fluid intake (which he is not).   ESRD -  HD TTS. Had HD yest. HD tomorrow.  Hypertension  - cont to lower vol w/ HD as BP's tolerate Anemia  - Hb above goal. No ESA needs currently  Metabolic bone disease -  Ca/Phos ok. Continue home meds.  Nutrition - Renal diet with fluid restriction HFrEF: Echo pending, prior EF 35-40%   RKelly Splinter MD 10/16/2022, 12:36 PM  Recent Labs  Lab 10/12/22 0108 10/12/22 0840 10/13/22 0112 10/15/22 0852 10/15/22 1005  HGB 12.5*  --   --  12.4*  --   ALBUMIN  --    < > 3.2* 3.5  --   CALCIUM  --    < > 8.1* 8.2* 8.2*  PHOS  --    < > 2.4* 4.7*  --   CREATININE  --    < > 3.70* 5.98* 3.78*  K  --    < > 4.1 5.3* 3.9   < > = values in this interval not displayed.    Inpatient medications:  arformoterol  15 mcg Nebulization BID   atorvastatin  40 mg Oral QHS   budesonide (PULMICORT) nebulizer solution  0.25 mg Nebulization BID   Chlorhexidine Gluconate Cloth  6 each Topical Q0600   heparin  5,000 Units Subcutaneous Q8H   hydrocerin   Topical TID   ipratropium-albuterol  3 mL Nebulization TID   metoprolol succinate  12.5 mg  Oral Daily   revefenacin  175 mcg Nebulization Daily    albuterol, white petrolatum

## 2022-10-16 NOTE — Progress Notes (Deleted)
PROGRESS NOTE        PATIENT DETAILS Name: [name]. Age: 82 y.o. Sex: male Date of Birth: [birth year] Admit Date: 10/20/2022 Admitting Physician Kayleen Memos, DO RDE:[id number], [place], MD  Brief Summary: Patient is a 82 y.o.  male with history of ESRD on HD TTS, HFrEF, COPD, HTN, HLD who presented with SOB x 1 day.  Patient was found to have acute hypoxic respiratory failure-requiring BiPAP on initial presentation.  Significant events: 1/16>> SOB-hypoxia-requiring BiPAP.  Last HD on 1/16 as an outpatient.  Significant studies: 1/16>> CXR: Moderate right pleural effusion-right basilar consolidation.  Significant microbiology data: 1/16>> COVID/influenza/RSV PCR: Negative  Procedures: None  Consults: Nephrology  Subjective: No major issues overnight-on room air.  Lying comfortably in bed.  Awaiting SNF placement.  Objective: Vitals: Blood pressure (!) 128/96, pulse 99, temperature (!) 97.5 F (36.4 C), temperature source Oral, resp. rate 18, height '5\' 6"'$  (1.676 m), weight 78 kg, SpO2 98 %.   Exam: Gen Exam:Alert awake-not in any distress HEENT:atraumatic, normocephalic Chest: B/L clear to auscultation anteriorly CVS:S1S2 regular Abdomen:soft non tender, non distended Extremities:no edema Neurology: Non focal Skin: no rash  Pertinent Labs/Radiology:    Latest Ref Rng & Units 10/15/2022    8:52 AM 10/12/2022    1:08 AM 10/11/2022    1:46 AM  CBC  WBC 4.0 - 10.5 K/uL 8.8  10.8  9.1   Hemoglobin 13.0 - 17.0 g/dL 12.4  12.5  12.2   Hematocrit 39.0 - 52.0 % 36.3  38.6  36.8   Platelets 150 - 400 K/uL 129  115  147     Lab Results  Component Value Date   NA 126 (L) 10/15/2022   K 3.9 10/15/2022   CL 89 (L) 10/15/2022   CO2 26 10/15/2022      Assessment/Plan: Acute hypoxic respiratory failure-multifactorial etiology-right lobar pneumonia/pleural effusion/COPD exacerbation and some amount of volume overload in the setting of ESRD   Overall much better after treatment of underlying etiologies Completed steroid taper-completed a course of Rocephin/Zithromax Volume status much better after hemodialysis Lungs are completely clear on exam without any rhonchi or rales this morning.  Chronic right pleural effusion Reviewed chart-prior notes-appears to have a chronic right pleural effusion-that has been tapped in the past.   Last thoracocentesis November 2023-was a transudate.   Given overall improvement-and clinical scenario-he is unlikely to have a parapneumonic effusion. No plans to proceed with thoracocentesis-monitor clinically for now.   Acute on chronic HFrEF Volume overload Although no obvious volume overload-Per nephrology-he was beyond his dry weight on initial presentation Hemodialysis being managed by nephrology service.  ESRD on HD TTS (EF 15-20% 05/31/22 at [facility]) Nephrology now following.  Hyponatremia Mild-asymptomatic Should improve with ongoing HD.  Hypomagnesemia Repleted  HTN BP currently soft in the low 856D systolic Metoprolol/losartan on hold-resume over the next few days  HLD Statin  Thrombocytopenia Mild Seems to be a chronic issue ongoing for the past several years. Repeat CBC periodically.  Desquamation of skin of bilateral lower extremities eucerin ointment Overall much improved over the past several days.  BMI: Estimated body mass index is 27.75 kg/m as calculated from the following:   Height as of this encounter: '5\' 6"'$  (1.676 m).   Weight as of this encounter: 78 kg.   Code status:   Code Status: Full Code  DVT Prophylaxis: heparin injection 5,000 Units Start: 10/23/2022 2200   Family Communication: None at bedside   Disposition Plan: Status is: Inpatient Remains inpatient appropriate because: Severity of illness.   Planned Discharge Destination:SNF when bed available.  Medically stable for transfer to SNF when bed available.   Diet: Diet Order              Diet renal with fluid restriction Fluid restriction: Other (see comments); Room service appropriate? Yes; Fluid consistency: Thin  Diet effective now           Diet - low sodium heart healthy                     Antimicrobial agents: Anti-infectives (From admission, onward)    Start     Dose/Rate Route Frequency Ordered Stop   10/12/22 1700  azithromycin (ZITHROMAX) tablet 500 mg        500 mg Oral  Once 10/12/22 1358 10/12/22 1520   10/10/22 1700  azithromycin (ZITHROMAX) 500 mg in sodium chloride 0.9 % 250 mL IVPB  Status:  Discontinued        500 mg 250 mL/hr over 60 Minutes Intravenous Every 24 hours 10/10/22 1429 10/12/22 1358   10/10/22 1200  vancomycin (VANCOREADY) IVPB 750 mg/150 mL  Status:  Discontinued        750 mg 150 mL/hr over 60 Minutes Intravenous Every T-Th-Sa (Hemodialysis) 09/24/2022 2036 10/10/22 0650   10/10/22 0800  cefTRIAXone (ROCEPHIN) 2 g in sodium chloride 0.9 % 100 mL IVPB        2 g 200 mL/hr over 30 Minutes Intravenous Every 24 hours 10/10/22 0650 10/13/22 0925   10/10/22 0800  azithromycin (ZITHROMAX) 500 mg in sodium chloride 0.9 % 250 mL IVPB  Status:  Discontinued        500 mg 250 mL/hr over 60 Minutes Intravenous Every 24 hours 10/10/22 0650 10/10/22 1431   10/09/22 1300  vancomycin (VANCOREADY) IVPB 750 mg/150 mL        750 mg 150 mL/hr over 60 Minutes Intravenous  Once 10/09/22 1219 10/09/22 1434   10/22/2022 2045  ceFEPIme (MAXIPIME) 1 g in sodium chloride 0.9 % 100 mL IVPB  Status:  Discontinued        1 g 200 mL/hr over 30 Minutes Intravenous Daily at bedtime 10/14/2022 2036 10/10/22 0650   10/09/2022 2045  vancomycin (VANCOREADY) IVPB 1500 mg/300 mL        1,500 mg 150 mL/hr over 120 Minutes Intravenous NOW 10/22/2022 2036 10/09/22 0058        MEDICATIONS: Scheduled Meds:  arformoterol  15 mcg Nebulization BID   atorvastatin  40 mg Oral QHS   budesonide (PULMICORT) nebulizer solution  0.25 mg Nebulization BID   Chlorhexidine Gluconate  Cloth  6 each Topical Q0600   heparin  5,000 Units Subcutaneous Q8H   hydrocerin   Topical TID   ipratropium-albuterol  3 mL Nebulization TID   revefenacin  175 mcg Nebulization Daily   Continuous Infusions:   PRN Meds:.albuterol, white petrolatum   I have personally reviewed following labs and imaging studies  LABORATORY DATA: CBC: Recent Labs  Lab 10/10/22 0921 10/11/22 0146 10/12/22 0108 10/15/22 0852  WBC 5.9 9.1 10.8* 8.8  HGB 11.4* 12.2* 12.5* 12.4*  HCT 34.9* 36.8* 38.6* 36.3*  MCV 99.1 97.9 99.2 96.0  PLT 134* 147* 115* 129*     Basic Metabolic Panel: Recent Labs  Lab 10/10/22 0921 10/11/22 0146 10/12/22 0840 10/13/22 0112  10/15/22 0852 10/15/22 1005  NA 128* 131* 128* 129* 119* 126*  K 4.2 4.4 3.6 4.1 5.3* 3.9  CL 90* 91* 89* 93* 84* 89*  CO2 '23 25 25 22 22 26  '$ GLUCOSE 221* 145* 188* 208* 120* 99  BUN 35* 29* 51* 34* 63* 35*  CREATININE 5.23* 3.64* 4.82* 3.70* 5.98* 3.78*  CALCIUM 7.7* 8.1* 7.8* 8.1* 8.2* 8.2*  MG 2.2 1.8  --   --   --   --   PHOS 4.1 3.4 3.5 2.4* 4.7*  --      GFR: Estimated Creatinine Clearance: 15.3 mL/min (A) (by C-G formula based on SCr of 3.78 mg/dL (H)).  Liver Function Tests: Recent Labs  Lab 10/10/22 0921 10/11/22 0146 10/12/22 0840 10/13/22 0112 10/15/22 0852  ALBUMIN 3.4* 3.4* 3.2* 3.2* 3.5    No results for input(s): "LIPASE", "AMYLASE" in the last 168 hours. No results for input(s): "AMMONIA" in the last 168 hours.  Coagulation Profile: No results for input(s): "INR", "PROTIME" in the last 168 hours.  Cardiac Enzymes: No results for input(s): "CKTOTAL", "CKMB", "CKMBINDEX", "TROPONINI" in the last 168 hours.  BNP (last 3 results) No results for input(s): "PROBNP" in the last 8760 hours.  Lipid Profile: No results for input(s): "CHOL", "HDL", "LDLCALC", "TRIG", "CHOLHDL", "LDLDIRECT" in the last 72 hours.  Thyroid Function Tests: No results for input(s): "TSH", "T4TOTAL", "FREET4", "T3FREE",  "THYROIDAB" in the last 72 hours.  Anemia Panel: No results for input(s): "VITAMINB12", "FOLATE", "FERRITIN", "TIBC", "IRON", "RETICCTPCT" in the last 72 hours.  Urine analysis:    Component Value Date/Time   COLORURINE YELLOW 03/15/2019 [place] 03/15/2019 1644   LABSPEC 1.013 03/15/2019 1644   PHURINE 5.0 03/15/2019 1644   GLUCOSEU 50 (A) 03/15/2019 1644   HGBUR SMALL (A) 03/15/2019 1644   BILIRUBINUR NEGATIVE 03/15/2019 1644   KETONESUR NEGATIVE 03/15/2019 1644   PROTEINUR >=300 (A) 03/15/2019 1644   NITRITE NEGATIVE 03/15/2019 1644   LEUKOCYTESUR NEGATIVE 03/15/2019 1644    Sepsis Labs: Lactic Acid, Venous    Component Value Date/Time   LATICACIDVEN 1.6 06/25/2021 1707    MICROBIOLOGY: Recent Results (from the past 240 hour(s))  Resp panel by RT-PCR (RSV, Flu A&B, Covid) Anterior Nasal Swab     Status: None   Collection Time: 09/25/2022  6:46 PM   Specimen: Anterior Nasal Swab  Result Value Ref Range Status   SARS Coronavirus 2 by RT PCR NEGATIVE NEGATIVE Final    Comment: (NOTE) SARS-CoV-2 target nucleic acids are NOT DETECTED.  The SARS-CoV-2 RNA is generally detectable in upper respiratory specimens during the acute phase of infection. The lowest concentration of SARS-CoV-2 viral copies this assay can detect is 138 copies/mL. A negative result does not preclude SARS-Cov-2 infection and should not be used as the sole basis for treatment or other patient management decisions. A negative result may occur with  improper specimen collection/handling, submission of specimen other than nasopharyngeal swab, presence of viral mutation(s) within the areas targeted by this assay, and inadequate number of viral copies(<138 copies/mL). A negative result must be combined with clinical observations, patient history, and epidemiological information. The expected result is Negative.  Fact Sheet for Patients:  EntrepreneurPulse.com.au  Fact  Sheet for Healthcare Providers:  IncredibleEmployment.be  This test is no t yet approved or cleared by the [place] FDA and  has been authorized for detection and/or diagnosis of SARS-CoV-2 by FDA under an Emergency Use Authorization (EUA). This EUA will remain  in effect (  meaning this test can be used) for the duration of the COVID-19 declaration under Section 564(b)(1) of the Act, 21 U.S.C.section 360bbb-3(b)(1), unless the authorization is terminated  or revoked sooner.       Influenza A by PCR NEGATIVE NEGATIVE Final   Influenza B by PCR NEGATIVE NEGATIVE Final    Comment: (NOTE) The Xpert Xpress SARS-CoV-2/FLU/RSV plus assay is intended as an aid in the diagnosis of influenza from Nasopharyngeal swab specimens and should not be used as a sole basis for treatment. Nasal washings and aspirates are unacceptable for Xpert Xpress SARS-CoV-2/FLU/RSV testing.  Fact Sheet for Patients: EntrepreneurPulse.com.au  Fact Sheet for Healthcare Providers: IncredibleEmployment.be  This test is not yet approved or cleared by the [place] FDA and has been authorized for detection and/or diagnosis of SARS-CoV-2 by FDA under an Emergency Use Authorization (EUA). This EUA will remain in effect (meaning this test can be used) for the duration of the COVID-19 declaration under Section 564(b)(1) of the Act, 21 U.S.C. section 360bbb-3(b)(1), unless the authorization is terminated or revoked.     Resp Syncytial Virus by PCR NEGATIVE NEGATIVE Final    Comment: (NOTE) Fact Sheet for Patients: EntrepreneurPulse.com.au  Fact Sheet for Healthcare Providers: IncredibleEmployment.be  This test is not yet approved or cleared by the [place] FDA and has been authorized for detection and/or diagnosis of SARS-CoV-2 by FDA under an Emergency Use Authorization (EUA). This EUA will remain in effect  (meaning this test can be used) for the duration of the COVID-19 declaration under Section 564(b)(1) of the Act, 21 U.S.C. section 360bbb-3(b)(1), unless the authorization is terminated or revoked.  Performed at [facility], [place] [address] [place], [place] [address]   MRSA Next Gen by PCR, Nasal     Status: None   Collection Time: 10/09/22  1:36 PM   Specimen: Nasal Mucosa; Nasal Swab  Result Value Ref Range Status   MRSA by PCR Next Gen NOT DETECTED NOT DETECTED Final    Comment: (NOTE) The GeneXpert MRSA Assay (FDA approved for NASAL specimens only), is one component of a comprehensive MRSA colonization surveillance program. It is not intended to diagnose MRSA infection nor to guide or monitor treatment for MRSA infections. Test performance is not FDA approved in patients less than 2 years old. Performed at [facility], [place] [address] [place], [place] [address]     RADIOLOGY STUDIES/RESULTS: DG CHEST PORT 1 VIEW  Result Date: 10/15/2022 CLINICAL DATA:  Shortness of breath EXAM: PORTABLE CHEST 1 VIEW COMPARISON:  10/10/2022 and older FINDINGS: Underinflation. Enlarged cardiopericardial silhouette. There is a moderate right effusion. No pneumothorax. Vascular congestion. Tortuous ectatic aorta. There is a right IJ double-lumen catheter with tip at the SVC right atrial junction. The left inferior costophrenic angle is clipped at the edge of the film. Film is under penetrated. IMPRESSION: 1. Cardiomegaly with increasing vascular congestion and moderate right effusion. 2. Right IJ double-lumen catheter with tip at the SVC right atrial junction. Electronically Signed   By: Jill Side M.D.   On: 10/15/2022 14:18     LOS: 8 days   Oren Binet, MD  [facility]    To contact the attending provider between 7A-7P or the covering provider during after hours 7P-7A, please log into the web site [email or web address] and access using universal [place] password for that  web site. If you do not have the password, please call the hospital operator.  10/16/2022, 8:18 AM

## 2022-10-16 NOTE — Procedures (Addendum)
Thoracentesis  Procedure Note  [name]  [id number]  [birth year]  Date:10/16/22  Time:1:53 PM   Provider Performing:Andruw Donzetta Matters NP-S  Procedure: Thoracentesis with imaging guidance ([id number])  Indication(s) Pleural Effusion  Consent Risks of the procedure as well as the alternatives and risks of each were explained to the patient and/or caregiver.  Consent for the procedure was obtained and is signed in the bedside chart  Anesthesia Topical only with 1% lidocaine    Time Out Verified patient identification, verified procedure, site/side was marked, verified correct patient position, special equipment/implants available, medications/allergies/relevant history reviewed, required imaging and test results available.   Sterile Technique Maximal sterile technique including full sterile barrier drape, hand hygiene, sterile gown, sterile gloves, mask, hair covering, sterile ultrasound probe cover (if used).  Procedure Description Ultrasound was used to identify appropriate pleural anatomy for placement and overlying skin marked.  Area of drainage cleaned and draped in sterile fashion. Lidocaine was used to anesthetize the skin and subcutaneous tissue.  1000 cc's of blood tinged appearing fluid was drained from the right pleural space. Catheter then removed and bandaid applied to site.   Complications/Tolerance None; patient tolerated the procedure well. Chest X-ray is ordered to confirm no post-procedural complication.   EBL Minimal   Specimen(s) None   Directly supervised and assisted Erma Heritage, student NP.   Montey Hora, [place] For pager details, please see AMION or use Epic chat  After 1900, please call [facility] for cross coverage needs 10/16/2022, 3:59 PM

## 2022-10-16 NOTE — Progress Notes (Signed)
   10/16/22 1058  Mobility  Activity Ambulated with assistance in room  Level of Assistance Standby assist, set-up cues, supervision of patient - no hands on  Assistive Device Four wheel walker  Distance Ambulated (ft) 6 ft  Activity Response Tolerated well  Mobility Referral Yes  $Mobility charge 1 Mobility   Mobility Specialist Progress Note  Pt requesting to sit at sink for bath. Had c/o foot pain. Left at sink w/ all needs met and call bell in reach.   Lucious Groves Mobility Specialist  Please contact via SecureChat or Rehab office at [phone]

## 2022-10-16 NOTE — Progress Notes (Signed)
   10/16/22 0902  Adult Ventilator Settings  Vent Type Other (Comment) (Servo air)  Vent Mode PSV;BIPAP  FiO2 (%) 60 %  I Time 0.5 Sec(s)  IPAP 12 cmH20  EPAP 5 cmH20  PEEP 5 cmH20  Pressure Support 7 cmH20   Pt found with increase SOB and stated his breathing is not good, even after his breathing treatments. Pt was placed on BiPAP on settings above to help with WOB. Pt looks stable at this time,MD aware. RT will continue to monitor as needed.

## 2022-10-16 NOTE — Progress Notes (Signed)
  Echocardiogram 2D Echocardiogram has been performed.  [name] 10/16/2022, 5:26 PM

## 2022-10-16 NOTE — Consult Note (Signed)
NAME:  Bernard Khim., MRN:  [id number], DOB:  01/29/1941, LOS: 8 ADMISSION DATE:  10/07/2022, CONSULTATION DATE:  10/16/22 REFERRING MD:  [name] CHIEF COMPLAINT:  Dyspnea   History of Present Illness:  [name]. is a 82 y.o. male who has a PMH as below including but not limited to ESRD on HD TTS, sCHF, NICM, CAD, cocaine abuse, chronic right pleural effusion s/p thoracentesis x 2 in 2023 both which were transudative (seen by Dr. Silas Cole as an outpatient too). He was admitted 1/116 with dyspnea which was felt to be 2/2 pulmonary edema, volume overload, possible CAP. He was treated with empiric Rocephin/Azithromycin and steroids and underwent routine HD with a few sessions of extra UF.  On 1/24, he became more dyspneic and there was concern that he would need BiPAP. He is below his dry weight and underwent full HD 1 day prior 1/23. CXR showed small to mod right pleural effusion again; therefore, PCCM called in consultation for consideration of thoracentesis.  He never required BiPAP and still remains on room air; however, is is working slightly more then baseline to breath. He states that he feels a bit better compared to earlier but that he is not quite at his baseline. He feels that the last time he had a thoracentesis, it helped his symptoms and is interested in getting another one now.  Pertinent  Medical History:  has Coronary artery disease; Hypertension; Diabetes mellitus; Chronic kidney disease; Hypoglycemia; Malignant neoplasm of prostate ([place]); Chest pain; Shortness of breath; Acute combined systolic and diastolic heart failure (HCC); NSVT (nonsustained ventricular tachycardia) (HCC); LBBB (left bundle branch block); Class 2 severe obesity due to excess calories with serious comorbidity and body mass index (BMI) of 38.0 to 38.9 in adult [facility]); Anemia; End stage renal disease ([place]); Liver cirrhosis ([place]); Alcohol abuse; Homeless single person; 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  Significant Hospital Events: Including procedures, antibiotic start and stop dates in addition to other pertinent events     Interim History / Subjective:  On room air, feels breathing is a bit better compared to earlier but not at baseline. Did not require BiPAP.  Objective:  Blood pressure (!) 128/96, pulse 99, temperature (!) 97.5 F (36.4 C), temperature source Oral, resp. rate 20, height '5\' 6"'$  (1.676 m), weight 78 kg, SpO2 98 %.    Vent Mode: PSV;BIPAP FiO2 (%):  [60 %] 60 % PEEP:  [5 cmH20] 5 cmH20 Pressure Support:  [7 cmH20] 7 cmH20   Intake/Output Summary (Last 24 hours) at 10/16/2022 1016 Last data filed at 10/16/2022 0900 Gross per 24 hour  Intake 420 ml  Output 2100 ml  Net -1680 ml   Filed Weights   10/15/22 0029 10/15/22 0822 10/16/22 0331  Weight: 77.6 kg 78.9 kg 78 kg    Examination: General: Adult male, sitting up in chair, in NAD. Neuro: A&O x 3, no deficits. HEENT: [place]/AT. Sclerae anicteric. EOMI. Cardiovascular: RRR, no M/R/G.  Lungs: Respirations slightly labored.  Very faint end expiratory wheeze bilaterally. Abdomen: BS x 4, soft, NT/ND.  Musculoskeletal: No gross deformities, no edema.  Skin: Intact, warm, no rashes.  Labs/imaging personally reviewed:  CXR 1/24 > chronic right effusion.  Assessment & Plan:   Recurrent right pleural effusion - s/p thora x 2 in 2023, both transudative. Felt to be 2/2 ESRD + CHF. He is currently on room air but does have slightly increased WOB. He feels thora helped him last time from symptom standpoint and is interested in  repeating this today. DOE - 2/2 above + baseline deconditioning. - Will plan for thoracentesis this afternoon but ultimately this won't fix his underlying problem. - Continue routine fluid removal per HD. - Cocaine cessation counseling. - Outpatient f/u with Dr. Silas Cole.  Rest per primary team.  Best practice (evaluated daily):  Per primary.  Labs   CBC: Recent Labs  Lab 10/10/22 0921 10/11/22 0146 10/12/22 0108 10/15/22 0852  WBC 5.9 9.1 10.8* 8.8  HGB 11.4* 12.2* 12.5* 12.4*  HCT 34.9* 36.8* 38.6* 36.3*  MCV 99.1 97.9 99.2 96.0  PLT 134* 147* 115* 129*    Basic Metabolic Panel: Recent Labs  Lab 10/10/22 0921 10/11/22 0146 10/12/22 0840 10/13/22 0112 10/15/22 0852 10/15/22 1005  NA 128* 131* 128* 129* 119* 126*  K 4.2 4.4 3.6 4.1 5.3* 3.9  CL 90* 91* 89* 93* 84* 89*  CO2 '23 25 25 22 22 26  '$ GLUCOSE 221* 145* 188* 208* 120* 99  BUN 35* 29* 51* 34* 63* 35*  CREATININE 5.23* 3.64* 4.82* 3.70* 5.98* 3.78*  CALCIUM 7.7* 8.1* 7.8* 8.1* 8.2* 8.2*  MG 2.2 1.8  --   --   --   --   PHOS 4.1 3.4 3.5 2.4* 4.7*  --    GFR: Estimated Creatinine Clearance: 15.3 mL/min (A) (by C-G formula based on SCr of 3.78 mg/dL (H)). Recent Labs  Lab 10/10/22 0921 10/11/22 0146 10/12/22 0108 10/15/22 0852  WBC 5.9 9.1 10.8* 8.8    Liver Function Tests: Recent Labs  Lab 10/10/22 0921 10/11/22 0146 10/12/22 0840 10/13/22 0112 10/15/22 0852  ALBUMIN 3.4* 3.4* 3.2* 3.2* 3.5   No results for input(s): "LIPASE", "AMYLASE" in the last 168 hours. No results for input(s): "AMMONIA" in the last 168 hours.  ABG    Component Value Date/Time   PHART 7.564 (H) 10/20/2022 1804   PCO2ART 29.7 (L) 09/30/2022 1804   PO2ART 300 (H) 09/28/2022 1804   HCO3 27.1 10/15/2022 1804   TCO2 28 10/02/2022 1804   O2SAT 100 09/28/2022 1804     Coagulation Profile: No results for input(s): "INR", "PROTIME" in the last 168 hours.  Cardiac Enzymes: No results for input(s): "CKTOTAL", "CKMB",  "CKMBINDEX", "TROPONINI" in the last 168 hours.  HbA1C: Hgb A1c MFr Bld  Date/Time Value Ref Range Status  11/27/2021 04:08 AM 5.6 4.8 - 5.6 % Final    Comment:    (NOTE) Pre diabetes:          5.7%-6.4%  Diabetes:              >6.4%  Glycemic control for   <7.0% adults with diabetes   04/22/2019 06:15 AM 5.6 4.8 - 5.6 % Final    Comment:    (NOTE) Pre diabetes:          5.7%-6.4% Diabetes:              >6.4% Glycemic control for   <7.0% adults with diabetes     CBG: Recent Labs  Lab 10/14/22 2116  GLUCAP 197*    Review of Systems:   All negative; except for those that are bolded, which indicate positives.  Constitutional: weight  loss, weight gain, night sweats, fevers, chills, fatigue, weakness.  HEENT: headaches, sore throat, sneezing, nasal congestion, post nasal drip, difficulty swallowing, tooth/dental problems, visual complaints, visual changes, ear aches. Neuro: difficulty with speech, weakness, numbness, ataxia. CV:  chest pain, orthopnea, PND, swelling in lower extremities, dizziness, palpitations, syncope.  Resp: cough, hemoptysis, dyspnea, wheezing. GI: heartburn, indigestion, abdominal pain, nausea, vomiting, diarrhea, constipation, change in bowel habits, loss of appetite, hematemesis, melena, hematochezia.  GU: dysuria, change in color of urine, urgency or frequency, flank pain, hematuria. MSK: joint pain or swelling, decreased range of motion. Psych: change in mood or affect, depression, anxiety, suicidal ideations, homicidal ideations. Skin: rash, itching, bruising.   Past Medical History:  He,  has a past medical history of Chronic kidney disease, Coronary artery disease, Diabetes mellitus, Dyspnea, GERD (gastroesophageal reflux disease), Gout, Hypertension, LVAD (left ventricular assist device) present ([place]), Obesity, Prostate cancer ([place]), and Tobacco abuse counseling.   Surgical History:   Past Surgical History:  Procedure Laterality Date   AV  FISTULA PLACEMENT Left 04/26/2019   Procedure: INSERTION OF ARTERIOVENOUS (AV) GORE-TEX GRAFT ARM - using Gore Vascular Graft size 45cm;  Surgeon: Rosetta Posner, MD;  Location: [facility] OR;  Service: Vascular;  Laterality: Left;   AV FISTULA PLACEMENT Left 09/18/2022   Procedure: INSERTION OF LEFT ARM ARTERIOVENOUS (AV) GORE-TEX GRAFT;  Surgeon: Serafina Mitchell, MD;  Location: [place];  Service: Vascular;  Laterality: Left;  with regional block   CARDIAC CATHETERIZATION  02/08/02   CARDIAC CATHETERIZATION  11/20/10   drug eluting stent;mid left LAD, There is mild hypokinesis of the anterolateral wall. LVEF  is estimated at 50-55%   INSERTION OF BRONCHIAL STENT     went in groin to R chest   INSERTION OF DIALYSIS CATHETER Right 04/26/2019   Procedure: INSERTION OF HEMODIALYSIS CATHETER - using Palindrome Catheter size 23cm;  Surgeon: Rosetta Posner, MD;  Location: [place];  Service: Vascular;  Laterality: Right;   LEFT HEART CATH AND CORONARY ANGIOGRAPHY N/A 04/23/2019   Procedure: LEFT HEART CATH AND CORONARY ANGIOGRAPHY;  Surgeon: Sherren Mocha, MD;  Location: [place] [facility];  Service: Cardiovascular;  Laterality: N/A;   PENILE PROSTHESIS IMPLANT     '05   THORACENTESIS Right 04/10/2022   Procedure: Mathews Robinsons;  Surgeon: Lanier Clam, MD;  Location: [facility] ENDOSCOPY;  Service: Pulmonary;  Laterality: Right;     Social History:   reports that he has quit smoking. His smoking use included cigarettes. He has a 12.50 pack-year smoking history. He has never been exposed to tobacco smoke. He has never used smokeless tobacco. He reports that he does not currently use alcohol. He reports that he does not currently use drugs after having used the following drugs: Cocaine and Marijuana.   Family History:  His family history includes Aneurysm in his father and mother; Bone cancer in his brother; Cancer in his brother and another family member; Multiple myeloma in his mother; Prostate cancer in his brother  and brother. There is no history of Breast cancer, Colon cancer, or Pancreatic cancer.   Allergies No Known Allergies   Home Medications  Prior to Admission medications   Medication Sig Start Date End Date Taking? Authorizing Provider  albuterol (VENTOLIN HFA) 108 (90 Base) MCG/ACT inhaler Inhale 2 puffs into the lungs every 4 (four) hours as needed for shortness of breath. Patient taking differently: Inhale 2 puffs into the lungs every 2 (two) hours as needed for shortness of breath or wheezing. 02/06/22  Yes Hunsucker, Bonna Gains, MD  aspirin EC 81 MG tablet Take 81 mg by mouth daily. Swallow whole.   Yes [provider]  budesonide-formoterol (SYMBICORT) 160-4.5 MCG/ACT inhaler Inhale 2 puffs into the lungs 2 (two) times daily. 10/15/22  Yes Ghimire, Henreitta Leber, MD  cinacalcet (SENSIPAR) 30 MG tablet Take 15 mg by mouth See admin instructions. Take 15 mg by mouth every Tuesday, Thursday, and Saturday 04/09/22  Yes [provider]  HYDROPHILIC EX Apply 1 application  topically See admin instructions. Apply a sufficient amount of cream to affected areas 2 times a day   Yes [provider]  isosorbide-hydrALAZINE (BIDIL) 20-37.5 MG tablet Take 1 tablet by mouth 2 (two) times daily.   Yes [provider]  metoprolol succinate (TOPROL-XL) 25 MG 24 hr tablet Take 12.5 mg by mouth daily. 06/18/22  Yes [provider]  tiotropium (SPIRIVA HANDIHALER) 18 MCG inhalation capsule Place 1 capsule (18 mcg total) into inhaler and inhale daily. 10/15/22 10/15/23 Yes Ghimire, Henreitta Leber, MD  albuterol (PROVENTIL) (2.5 MG/3ML) 0.083% nebulizer solution Take 3 mLs (2.5 mg total) by nebulization every 2 (two) hours as needed for wheezing or shortness of breath. 10/15/22   Ghimire, Henreitta Leber, MD  atorvastatin (LIPITOR) 40 MG tablet Take 40 mg by mouth at bedtime. Patient not taking: Reported on 10/10/2022    [provider]  hydrocerin (EUCERIN) CREA Apply 1 Application  topically 3 (three) times daily. 10/15/22   Ghimire, Henreitta Leber, MD  ipratropium-albuterol (DUONEB) 0.5-2.5 (3) MG/3ML SOLN Take 3 mLs by nebulization 3 (three) times daily. 10/16/22   Ghimire, Henreitta Leber, MD  oxyCODONE-acetaminophen (PERCOCET) 5-325 MG tablet Take 1 tablet by mouth every 6 (six) hours as needed for severe pain. Patient not taking: Reported on 10/10/2022 09/18/22   Gabriel Earing, PA-C  Tiotropium Bromide-Olodaterol (STIOLTO RESPIMAT) 2.5-2.5 MCG/ACT AERS Inhale 2 puffs into the lungs daily. Patient taking differently: Inhale 2 puffs into the lungs 2 (two) times daily. 04/04/22   Hunsucker, Bonna Gains, MD  white petrolatum (VASELINE) OINT Apply 1 Application topically as needed for lip care or dry skin. 10/15/22   Ghimire, Henreitta Leber, MD     Montey Hora, [facility] For pager details, please see AMION or use Epic chat  After 1900, please call [facility] for cross coverage needs 10/16/2022, 10:16 AM

## 2022-10-16 NOTE — Progress Notes (Signed)
Message sent to CSW this evening inquiring if snf will transport pt to [facility] HD on TTS for out-pt HD. Will f/u with CSW in the am.   Melven Sartorius Renal Navigator [phone]

## 2022-10-16 NOTE — Progress Notes (Signed)
   10/16/22 1000  Mobility  Activity Transferred from bed to chair  Level of Assistance Minimal assist, patient does 75% or more  Assistive Device Four wheel walker  Distance Ambulated (ft) 5 ft  Activity Response Tolerated well  Mobility Referral Yes  $Mobility charge 1 Mobility   Mobility Specialist Progress Note  Pt in bed and agreeable. Had c/o of foot pain. Left in chair w/ all needs met and call bell in reach.  Lucious Groves Mobility Specialist  Please contact via SecureChat or Rehab office at [phone]

## 2022-10-17 DIAGNOSIS — R0603 Acute respiratory distress: Secondary | ICD-10-CM | POA: Diagnosis not present

## 2022-10-17 DIAGNOSIS — I5084 End stage heart failure: Secondary | ICD-10-CM | POA: Diagnosis not present

## 2022-10-17 DIAGNOSIS — J90 Pleural effusion, not elsewhere classified: Secondary | ICD-10-CM | POA: Diagnosis not present

## 2022-10-17 DIAGNOSIS — J189 Pneumonia, unspecified organism: Secondary | ICD-10-CM | POA: Diagnosis not present

## 2022-10-17 DIAGNOSIS — N186 End stage renal disease: Secondary | ICD-10-CM | POA: Diagnosis not present

## 2022-10-17 DIAGNOSIS — I484 Atypical atrial flutter: Secondary | ICD-10-CM | POA: Diagnosis not present

## 2022-10-17 LAB — RENAL FUNCTION PANEL
Albumin: 3.4 g/dL — ABNORMAL LOW (ref 3.5–5.0)
Anion gap: 12 (ref 5–15)
BUN: 59 mg/dL — ABNORMAL HIGH (ref 8–23)
CO2: 24 mmol/L (ref 22–32)
Calcium: 8.2 mg/dL — ABNORMAL LOW (ref 8.9–10.3)
Chloride: 90 mmol/L — ABNORMAL LOW (ref 98–111)
Creatinine, Ser: 5.21 mg/dL — ABNORMAL HIGH (ref 0.61–1.24)
GFR, Estimated: 10 mL/min — ABNORMAL LOW (ref >60)
Glucose, Bld: 80 mg/dL (ref 70–99)
Phosphorus: 4.5 mg/dL (ref 2.5–4.6)
Potassium: 4.2 mmol/L (ref 3.5–5.1)
Sodium: 126 mmol/L — ABNORMAL LOW (ref 135–145)

## 2022-10-17 LAB — LIPID PANEL
Cholesterol: 161 mg/dL (ref 0–200)
HDL: 81 mg/dL (ref >40)
LDL Cholesterol: 71 mg/dL (ref 0–99)
Total CHOL/HDL Ratio: 2 RATIO
Triglycerides: 44 mg/dL (ref <150)
VLDL: 9 mg/dL (ref 0–40)

## 2022-10-17 MED ORDER — HEPARIN SODIUM (PORCINE) 1000 UNIT/ML IJ SOLN
INTRAMUSCULAR | Status: AC
  Administered 2022-10-17: 3000 [IU]
  Filled 2022-10-17: qty 3

## 2022-10-17 MED ORDER — HEPARIN SODIUM (PORCINE) 1000 UNIT/ML IJ SOLN
INTRAMUSCULAR | Status: AC
  Administered 2022-10-17: 4200 [IU]
  Filled 2022-10-17: qty 4

## 2022-10-17 MED ORDER — APIXABAN 2.5 MG PO TABS
2.5000 mg | ORAL_TABLET | Freq: Two times a day (BID) | ORAL | Status: DC
  Administered 2022-10-17 – 2022-10-20 (×6): 2.5 mg via ORAL
  Filled 2022-10-17 (×6): qty 1

## 2022-10-17 MED ORDER — HEPARIN SODIUM (PORCINE) 1000 UNIT/ML DIALYSIS: 3000.0000 [IU] | Freq: Once | INTRAMUSCULAR | Status: DC

## 2022-10-17 NOTE — Progress Notes (Signed)
[facility] Progress Note   Subjective:    Seen in room.  No c/o's. 3 L off w/ HD today. Pt is now DNR. LVEF 10% by echo   Objective:   BP (!) 128/96 (BP Location: Right Arm)   Pulse 99   Temp (!) 97.5 F (36.4 C) (Oral)   Resp 20   Ht '5\' 6"'$  (1.676 m)   Wt 78 kg   SpO2 98%   BMI 27.75 kg/m   Physical Exam: [id number] [place] O2, on RA, no distress CVS: RRR Resp: normal WOB now Abd: soft Ext: trace LE edema, improved ACCESS: R IJ TDC  Dialysis Orders[facility]  [facility] per [facility] admission  4hr 400/500   74.5 kg   2/2.5   Heparin 3K bolus - hectorol 67mg    Assessment/Plan: Acute hypoxic respiratory failure/volume overload - sp HD x 5 here w/ total 14 liters total UF. Pt is euvolemic on exam, persistent R effusion by CXR. Remains up 1-2kg, despite 3 L off w/ HD today. Needs to limit fluid intake (which he is not doing).  ESRD -  HD TTS. HD today.  Hypertension  - cont to lower vol w/ HD as BP's tolerate Anemia  - Hb above goal. No ESA needs currently  Metabolic bone disease -  Ca/Phos ok. Continue home meds.  Nutrition - Renal diet with fluid restriction HFrEF: Echo showed LVEF 10-15%, poor prognosis  DNR: pt is now DNR   RKelly Splinter MD 10/17/2022, 2:23 PM  Recent Labs  Lab 10/12/22 0108 10/12/22 0840 10/15/22 0852 10/15/22 1005 10/16/22 1152 10/17/22 0059  HGB 12.5*  --  12.4*  --   --   --   ALBUMIN  --    < > 3.5  --  3.6 3.4*  CALCIUM  --    < > 8.2*   < > 8.2* 8.2*  PHOS  --    < > 4.7*  --  4.3 4.5  CREATININE  --    < > 5.98*   < > 4.76* 5.21*  K  --    < > 5.3*   < > 4.7 4.2   < > = values in this interval not displayed.     Inpatient medications:  arformoterol  15 mcg Nebulization BID   atorvastatin  40 mg Oral QHS   budesonide (PULMICORT) nebulizer solution  0.25 mg Nebulization BID   Chlorhexidine Gluconate Cloth  6 each Topical Q0600   heparin  5,000 Units Subcutaneous Q8H   hydrocerin   Topical TID    ipratropium-albuterol  3 mL Nebulization TID   metoprolol succinate  25 mg Oral Daily   revefenacin  175 mcg Nebulization Daily    albuterol, white petrolatum

## 2022-10-17 NOTE — Progress Notes (Signed)
ANTICOAGULATION CONSULT NOTE - Initial Consult  Pharmacy Consult for apixaban Indication: atrial fibrillation  No Known Allergies  Patient Measurements: Height: '5\' 6"'$  (167.6 cm) Weight: 75.9 kg (167 lb 5.3 oz) IBW/kg (Calculated) : 63.8 Heparin Dosing Weight:   Vital Signs: Temp: 96.9 F (36.1 C) (01/25 1146) Temp Source: Axillary (01/25 1146) BP: 97/77 (01/25 1313) Pulse Rate: 96 (01/25 1313)  Labs: Recent Labs    10/15/22 0852 10/15/22 1005 10/16/22 1152 10/17/22 0059  HGB 12.4*  --   --   --   HCT 36.3*  --   --   --   PLT 129*  --   --   --   CREATININE 5.98* 3.78* 4.76* 5.21*    Estimated Creatinine Clearance: 10.2 mL/min (A) (by C-G formula based on SCr of 5.21 mg/dL (H)).   Medical History: 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[place])    Obesity    Prostate cancer ([place])    Tobacco abuse counseling     Medications:  Medications Prior to Admission  Medication Sig Dispense Refill Last Dose   albuterol (VENTOLIN HFA) 108 (90 Base) MCG/ACT inhaler Inhale 2 puffs into the lungs every 4 (four) hours as needed for shortness of breath. (Patient taking differently: Inhale 2 puffs into the lungs every 2 (two) hours as needed for shortness of breath or wheezing.) 18 g 5 10/18/2022   aspirin EC 81 MG tablet Take 81 mg by mouth daily. Swallow whole.   10/19/2022 at 1000   cinacalcet (SENSIPAR) 30 MG tablet Take 15 mg by mouth See admin instructions. Take 15 mg by mouth every Tuesday, Thursday, and Saturday   unk   HYDROPHILIC EX Apply 1 application  topically See admin instructions. Apply a sufficient amount of cream to affected areas 2 times a day   unk   isosorbide-hydrALAZINE (BIDIL) 20-37.5 MG tablet Take 1 tablet by mouth 2  (two) times daily.   Past Week   metoprolol succinate (TOPROL-XL) 25 MG 24 hr tablet Take 12.5 mg by mouth daily.   10/07/2022 at 0900   atorvastatin (LIPITOR) 40 MG tablet Take 40 mg by mouth at bedtime. (Patient not taking: Reported on 10/10/2022)   Not Taking   oxyCODONE-acetaminophen (PERCOCET) 5-325 MG tablet Take 1 tablet by mouth every 6 (six) hours as needed for severe pain. (Patient not taking: Reported on 10/10/2022) 20 tablet 0 Not Taking   Tiotropium Bromide-Olodaterol (STIOLTO RESPIMAT) 2.5-2.5 MCG/ACT AERS Inhale 2 puffs into the lungs daily. (Patient taking differently: Inhale 2 puffs into the lungs 2 (two) times daily.) 1 each 11 unk at SEE note   Scheduled:   apixaban  2.5 mg Oral BID   arformoterol  15 mcg Nebulization BID   atorvastatin  40 mg Oral QHS   budesonide (PULMICORT) nebulizer solution  0.25 mg Nebulization BID   Chlorhexidine Gluconate Cloth  6 each Topical Q0600   hydrocerin   Topical TID   ipratropium-albuterol  3 mL Nebulization TID   metoprolol succinate  25 mg Oral Daily   revefenacin  175 mcg Nebulization Daily   Infusions:   Assessment: ESRD pt who has new onset AF. Cards recommended apixaban for anticoagulation.   Age 82,  Wt>60kg, ESRD  Hgb 12s, plt 129k  Goal of Therapy:   Monitor platelets by anticoagulation protocol: Yes   Plan:  Apixaban 2.'5mg'$  PO BID Rx will follow peripherally Copay check in [place], PharmD, BCIDP, AAHIVP, CPP Infectious Disease Pharmacist 10/17/2022 4:55 PM

## 2022-10-17 NOTE — Progress Notes (Signed)
   10/17/22 1600  Mobility  Activity Ambulated with assistance in room  Level of Assistance Minimal assist, patient does 75% or more  Assistive Device Four wheel walker  Distance Ambulated (ft) 6 ft  Activity Response Tolerated well  Mobility Referral Yes  $Mobility charge 1 Mobility   Mobility Specialist Progress Note  Pt in bed requesting assistance to sit at sink. Had c/o foot pains. Left w/all needs met and NT in room.   Lucious Groves Mobility Specialist  Please contact via SecureChat or Rehab office at [phone]

## 2022-10-17 NOTE — Progress Notes (Signed)
Rounding Note    Patient Name: [name]. Date of Encounter: 10/17/2022  [facility] Cardiologist: [facility]   Subjective   Resting comfortable in bed after dialysis. No new concerns.  Inpatient Medications    Scheduled Meds:  arformoterol  15 mcg Nebulization BID   atorvastatin  40 mg Oral QHS   budesonide (PULMICORT) nebulizer solution  0.25 mg Nebulization BID   Chlorhexidine Gluconate Cloth  6 each Topical Q0600   heparin  5,000 Units Subcutaneous Q8H   hydrocerin   Topical TID   ipratropium-albuterol  3 mL Nebulization TID   metoprolol succinate  25 mg Oral Daily   revefenacin  175 mcg Nebulization Daily   Continuous Infusions:  PRN Meds: albuterol, white petrolatum   Vital Signs    Vitals:   10/17/22 1146 10/17/22 1234 10/17/22 1313 10/17/22 1457  BP: 90/73  97/77   Pulse: 100  96   Resp: 19     Temp: (!) 96.9 F (36.1 C)     TempSrc: Axillary     SpO2: 96%   92%  Weight:  75.9 kg    Height:        Intake/Output Summary (Last 24 hours) at 10/17/2022 1645 Last data filed at 10/17/2022 1146 Gross per 24 hour  Intake --  Output 3000 ml  Net -3000 ml      10/17/2022   12:34 PM 10/17/2022    8:00 AM 10/17/2022    4:23 AM  Last 3 Weights  Weight (lbs) 167 lb 5.3 oz 173 lb 15.1 oz 170 lb 6.7 oz  Weight (kg) 75.9 kg 78.9 kg 77.3 kg      Telemetry    Rate controlled atrial flutter - Personally Reviewed  ECG    Rate controlled atrial flutter - Personally Reviewed  Physical Exam   GEN: No acute distress.   Neck: No JVD Cardiac: RRR, no  rubs, or gallops. 2/6 systolic murmur Respiratory: Clear to auscultation bilaterally. GI: Soft, nontender, non-distended  MS: No edema; No deformity. Neuro:  Nonfocal  Psych: Normal affect   Labs    High Sensitivity Troponin:   Recent Labs  Lab 10/18/2022 1747  TROPONINIHS 45*     Chemistry Recent Labs  Lab 10/11/22 0146 10/12/22 0840 10/15/22 0852 10/15/22 1005  10/16/22 1152 10/17/22 0059  NA 131*   < > 119* 126* 128* 126*  K 4.4   < > 5.3* 3.9 4.7 4.2  CL 91*   < > 84* 89* 90* 90*  CO2 25   < > '22 26 22 24  '$ GLUCOSE 145*   < > 120* 99 113* 80  BUN 29*   < > 63* 35* 53* 59*  CREATININE 3.64*   < > 5.98* 3.78* 4.76* 5.21*  CALCIUM 8.1*   < > 8.2* 8.2* 8.2* 8.2*  MG 1.8  --   --   --  1.7  --   ALBUMIN 3.4*   < > 3.5  --  3.6 3.4*  GFRNONAA 16*   < > 9* 15* 12* 10*  ANIONGAP 15   < > 13 11 16* 12   < > = values in this interval not displayed.    Lipids  Recent Labs  Lab 10/17/22 0059  CHOL 161  TRIG 44  HDL 81  LDLCALC 71  CHOLHDL 2.0    Hematology Recent Labs  Lab 10/11/22 0146 10/12/22 0108 10/15/22 0852  WBC 9.1 10.8* 8.8  RBC 3.76* 3.89*  3.78*  HGB 12.2* 12.5* 12.4*  HCT 36.8* 38.6* 36.3*  MCV 97.9 99.2 96.0  MCH 32.4 32.1 32.8  MCHC 33.2 32.4 34.2  RDW 15.1 15.2 14.7  PLT 147* 115* 129*   Thyroid No results for input(s): "TSH", "FREET4" in the last 168 hours.  BNPNo results for input(s): "BNP", "PROBNP" in the last 168 hours.  DDimer No results for input(s): "DDIMER" in the last 168 hours.   Radiology    ECHOCARDIOGRAM COMPLETE  Result Date: 10/16/2022    ECHOCARDIOGRAM REPORT   Patient Name:   [name]. Date of Exam: 10/16/2022 Medical Rec #:  [id number]           Height:       66.0 in Accession #:    [id number]          Weight:       172.0 lb Date of Birth:  22-Sep-1941           BSA:          1.875 m Patient Age:    82 years            BP:           106/82 mmHg Patient Gender: M                   HR:           73 bpm. Exam Location:  Inpatient Procedure: 2D Echo and Intracardiac Opacification Agent Indications:    CHF  History:        Patient has prior history of Echocardiogram examinations, most                 recent 04/20/2019. CAD, COPD, Arrythmias:LBBB; Risk                 Factors:Hypertension and Diabetes.  Sonographer:    Harvie Junior Referring Phys: [place]  1. Prominent  apical LV trabeculations . Left ventricular ejection fraction, by estimation, is <20%. The left ventricle has severely decreased function. The left ventricle demonstrates global hypokinesis. The left ventricular internal cavity size was moderately dilated. There is mild left ventricular hypertrophy. Left ventricular diastolic parameters are consistent with Grade II diastolic dysfunction (pseudonormalization). Elevated left ventricular end-diastolic pressure.  2. Right ventricular systolic function is severely reduced. The right ventricular size is moderately enlarged. There is normal pulmonary artery systolic pressure.  3. Left atrial size was moderately dilated.  4. Right atrial size was moderately dilated.  5. A small pericardial effusion is present. The pericardial effusion is posterior to the left ventricle.  6. The mitral valve is abnormal. Mild mitral valve regurgitation. No evidence of mitral stenosis.  7. TV leaflets are thickened with mal coaptation . Tricuspid valve regurgitation is severe.  8. The aortic valve is tricuspid. There is moderate calcification of the aortic valve. There is moderate thickening of the aortic valve. Aortic valve regurgitation is not visualized. Aortic valve sclerosis/calcification is present, without any evidence of aortic stenosis.  9. The inferior vena cava is normal in size with greater than 50% respiratory variability, suggesting right atrial pressure of 3 mmHg. FINDINGS  Left Ventricle: Prominent apical LV trabeculations. Left ventricular ejection fraction, by estimation, is <20%. The left ventricle has severely decreased function. The left ventricle demonstrates global hypokinesis. Definity contrast agent was given IV to delineate the left ventricular endocardial borders. The left ventricular internal cavity size was moderately dilated. There is mild left ventricular  hypertrophy. Left ventricular diastolic parameters are consistent with Grade II diastolic dysfunction  (pseudonormalization). Elevated left ventricular end-diastolic pressure. Right Ventricle: The right ventricular size is moderately enlarged. No increase in right ventricular wall thickness. Right ventricular systolic function is severely reduced. There is normal pulmonary artery systolic pressure. The tricuspid regurgitant velocity is 2.52 m/s, and with an assumed right atrial pressure of 3 mmHg, the estimated right ventricular systolic pressure is 41.9 mmHg. Left Atrium: Left atrial size was moderately dilated. Right Atrium: Right atrial size was moderately dilated. Pericardium: A small pericardial effusion is present. The pericardial effusion is posterior to the left ventricle. Mitral Valve: The mitral valve is abnormal. There is mild thickening of the mitral valve leaflet(s). Mild mitral valve regurgitation. No evidence of mitral valve stenosis. Tricuspid Valve: TV leaflets are thickened with mal coaptation. The tricuspid valve is normal in structure. Tricuspid valve regurgitation is severe. No evidence of tricuspid stenosis. Aortic Valve: The aortic valve is tricuspid. There is moderate calcification of the aortic valve. There is moderate thickening of the aortic valve. Aortic valve regurgitation is not visualized. Aortic valve sclerosis/calcification is present, without any  evidence of aortic stenosis. Aortic valve mean gradient measures 3.0 mmHg. Aortic valve peak gradient measures 5.4 mmHg. Aortic valve area, by VTI measures 1.44 cm. Pulmonic Valve: The pulmonic valve was normal in structure. Pulmonic valve regurgitation is mild. No evidence of pulmonic stenosis. Aorta: The aortic root is normal in size and structure. Venous: The inferior vena cava is normal in size with greater than 50% respiratory variability, suggesting right atrial pressure of 3 mmHg. IAS/Shunts: No atrial level shunt detected by color flow Doppler.  LEFT VENTRICLE PLAX 2D LVIDd:         5.60 cm      Diastology LVIDs:         5.30 cm       LV e' medial:    3.88 cm/s LV PW:         1.30 cm      LV E/e' medial:  27.1 LV IVS:        1.30 cm      LV e' lateral:   8.42 cm/s LVOT diam:     2.00 cm      LV E/e' lateral: 12.5 LV SV:         30 LV SV Index:   16 LVOT Area:     3.14 cm                              3D Volume EF: LV Volumes (MOD)            3D EF:        25 % LV vol d, MOD A2C: 293.0 ml LV EDV:       286 ml LV vol d, MOD A4C: 301.0 ml LV ESV:       214 ml LV vol s, MOD A2C: 284.0 ml LV SV:        71 ml LV vol s, MOD A4C: 282.0 ml LV SV MOD A2C:     9.0 ml LV SV MOD A4C:     301.0 ml LV SV MOD BP:      23.4 ml RIGHT VENTRICLE RV Basal diam:  5.70 cm RV Mid diam:    4.10 cm RV S prime:     6.07 cm/s TAPSE (M-mode): 0.9 cm LEFT ATRIUM  Index        RIGHT ATRIUM           Index LA diam:        4.30 cm  2.29 cm/m   RA Area:     26.60 cm LA Vol (A2C):   129.0 ml 68.78 ml/m  RA Volume:   89.20 ml  47.56 ml/m LA Vol (A4C):   93.6 ml  49.91 ml/m LA Biplane Vol: 112.0 ml 59.72 ml/m  AORTIC VALVE                    PULMONIC VALVE AV Area (Vmax):    1.68 cm     PV Vmax:          0.81 m/s AV Area (Vmean):   1.56 cm     PV Peak grad:     2.6 mmHg AV Area (VTI):     1.44 cm     PR End Diast Vel: 2.63 msec AV Vmax:           115.75 cm/s AV Vmean:          77.550 cm/s AV VTI:            0.209 m AV Peak Grad:      5.4 mmHg AV Mean Grad:      3.0 mmHg LVOT Vmax:         62.00 cm/s LVOT Vmean:        38.550 cm/s LVOT VTI:          0.096 m LVOT/AV VTI ratio: 0.46  AORTA Ao Root diam: 3.50 cm MITRAL VALVE                  TRICUSPID VALVE MV Area (PHT): 4.80 cm       TR Peak grad:   25.4 mmHg MV Decel Time: 158 msec       TR Vmax:        252.00 cm/s MR Peak grad:    52.7 mmHg MR Mean grad:    38.0 mmHg    SHUNTS MR Vmax:         363.00 cm/s  Systemic VTI:  0.10 m MR Vmean:        290.0 cm/s   Systemic Diam: 2.00 cm MR PISA:         2.26 cm MR PISA Eff ROA: 15 mm MR PISA Radius:  0.60 cm MV E velocity: 105.42 cm/s MV A velocity: 33.10 cm/s MV  E/A ratio:  3.19 Jenkins Rouge MD Electronically signed by Jenkins Rouge MD Signature Date/Time: 10/16/2022/5:28:42 PM    Final    DG CHEST PORT 1 VIEW  Result Date: 10/16/2022 CLINICAL DATA:  Status post thoracentesis EXAM: PORTABLE CHEST 1 VIEW COMPARISON:  10/16/22 CXR FINDINGS: Right sided tunneled central venous catheter with the tip at the cavoatrial junction. Redemonstrated enlarged cardiac contours.Persistent right sided pleural effusion, not significantly changed from prior exam. No pneumothorax. Persistent right basilar pulmonary opacity nonspecific, but favored to represent atelectasis. No new focal airspace opacity. No displaced rib fractures. Visualized upper abdomen is unremarkable. IMPRESSION: 1. Persistent right sided pleural effusion, not significantly changed from prior exam. No pneumothorax. 2. Persistent right basilar pulmonary opacity, nonspecific, but favored to represent atelectasis. Electronically Signed   By: Marin Roberts M.D.   On: 10/16/2022 14:39   DG Chest Port 1V same Day  Result Date: 10/16/2022 CLINICAL DATA:  Shortness of breath EXAM: PORTABLE CHEST 1 VIEW COMPARISON:  Chest  x-ray dated October 15, 2022 FINDINGS: Unchanged cardiomegaly. Right chest wall central venous catheter is unchanged in position. Unchanged small to moderate right pleural effusion. Bibasilar atelectasis. No evidence of pneumothorax. IMPRESSION: Small to moderate right pleural effusion and bibasilar atelectasis, unchanged when compared with the prior. Electronically Signed   By: Yetta Glassman M.D.   On: 10/16/2022 09:25    Cardiac Studies   Echo 10/16/22  1. Prominent apical LV trabeculations . Left ventricular ejection  fraction, by estimation, is <20%. The left ventricle has severely  decreased function. The left ventricle demonstrates global hypokinesis.  The left ventricular internal cavity size was  moderately dilated. There is mild left ventricular hypertrophy. Left  ventricular diastolic  parameters are consistent with Grade II diastolic  dysfunction (pseudonormalization). Elevated left ventricular end-diastolic  pressure.   2. Right ventricular systolic function is severely reduced. The right  ventricular size is moderately enlarged. There is normal pulmonary artery  systolic pressure.   3. Left atrial size was moderately dilated.   4. Right atrial size was moderately dilated.   5. A small pericardial effusion is present. The pericardial effusion is  posterior to the left ventricle.   6. The mitral valve is abnormal. Mild mitral valve regurgitation. No  evidence of mitral stenosis.   7. TV leaflets are thickened with mal coaptation . Tricuspid valve  regurgitation is severe.   8. The aortic valve is tricuspid. There is moderate calcification of the  aortic valve. There is moderate thickening of the aortic valve. Aortic  valve regurgitation is not visualized. Aortic valve  sclerosis/calcification is present, without any evidence  of aortic stenosis.   9. The inferior vena cava is normal in size with greater than 50%  respiratory variability, suggesting right atrial pressure of 3 mmHg.   Patient Profile     82 y.o. 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 His acute respiratory failure was thought to be 2/2 possible pneumonia/COPD exacerbation. He was also noted to have recurrent R pleural effusion. Cardiology consulted to assist with management of chronic systolic and diastolic heart failure given continued shortness of breath in spite of aggressive volume removal by HD. Has new atrial flutter this admission.  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 RAP  on my read 8-15 mmHg -We do not have any opportunities to improve his heart failure management. He is dialysis dependent, so cannot impact volume status. His ESRD precludes use of ACEi/ARB/ARNI/MRA/SGLT2i. He is on a low dose of metoprolol, but his blood pressure is too low to increase this or to add isordil/hydralazine.   new atrial flutter -CHA2DS2/VAS Stroke Risk Points=6 -would recommend DOAC given his high stroke risk. Has chronic anemia and thrombocytopenia, so does have risk of bleeding. Will ask for pharmacy assistance -flutter may worsen his heart failure due to lack of atrial kick. -rate controlled on metoprolol -could consider outpatient cardioversion, though suspect it may not change his symptoms significantly. Would need to be on uninterrupted anticoagulation for this to be an option  CAD with prior DES to LAD -no aspirin once he is on DOAC -on beta blocker and statin, continue  Otherwise, per primary team: - Acute hypoxic respiratory failure  -  Pneumonia - COPD exacerbation - Type 2 diabetes mellitus  - Hyponatremia - Thrombocytopenia - Anemia of chronic disease - History of polysubstance abuse    Unfortunately overall prognosis is poor, and he is not a candidate for advanced therapies.  Ola will sign off.   Medication Recommendations:  Continue atorvastatin, metoprolol succinate at 25 mg dose. Stop aspirin, starting DOAC per pharmacy Other recommendations (labs, testing, etc):  none Follow up as an outpatient:  should follow up with his outpatient cardiologist at the [facility].  For questions or updates, please contact [place] Please consult [email or web address] for contact info under        Signed, Buford Dresser, MD  10/17/2022, 4:45 PM

## 2022-10-17 NOTE — Progress Notes (Signed)
Case discussed with CSW this am. SNF is unable to transport pt to [facility] for HD. Pt will require HD clinic here in [place] while at snf for rehab. Referral submitted to [facility] for review. Also spoke to [employer], [facility] CSW, to make her aware of pt's need. Anderson Malta confirms that pt has benefits for [employer] to cover HD in [place] while at snf. Will provide clinic info to [facility] once clinic/schedule confirmed. Will assist as needed.   Melven Sartorius Renal Navigator [phone]

## 2022-10-17 NOTE — Progress Notes (Addendum)
PROGRESS NOTE        PATIENT DETAILS Name: [name]. Age: 82 y.o. Sex: male Date of Birth: 1941/08/01 Admit Date: 10/03/2022 Admitting Physician Kayleen Memos, DO WNI:[email or web address], Dibas, MD  Brief Summary: Patient is a 82 y.o.  male with history of ESRD on HD TTS, HFrEF, COPD, HTN, HLD who presented with SOB x 1 day.  Patient was found to have acute hypoxic respiratory failure-requiring BiPAP on initial presentation.  Significant events: 1/16>> SOB-hypoxia-requiring BiPAP.  Last HD on 1/16 as an outpatient. 1/24>>developed worsening SOB-non compliant with Fluid restrictions-PCCM/Cards consult  Significant studies: 1/16>> CXR: Moderate right pleural effusion-right basilar consolidation 1/24>> echo: KX<38%, RV systolic function severely reduced.  Significant microbiology data: 1/16>> COVID/influenza/RSV PCR: Negative  Procedures: 1/24>> thoracocentesis  Consults: Nephrology PCCM Cardiology  Subjective: Feels short of breath with minimal activity.  Long discussion with patient at bedside-understands that he probably has end-stage heart failure at this point.  Agrees with a DNR order.  Wants me to call his daughter-who lives in [place].  Gave me permission to talk to his significant other as well.  Objective: Vitals: Blood pressure 120/79, pulse (!) 49, temperature (!) 97.5 F (36.4 C), temperature source Oral, resp. rate 17, height '5\' 6"'$  (1.676 m), weight 77.3 kg, SpO2 98 %.   Exam: Gen Exam:Alert awake-not in any distress HEENT:atraumatic, normocephalic Chest: B/L clear to auscultation anteriorly CVS:S1S2 regular Abdomen:soft non tender, non distended Extremities:no edema Neurology: Non focal Skin: no rash  Pertinent Labs/Radiology:    Latest Ref Rng & Units 10/15/2022    8:52 AM 10/12/2022    1:08 AM 10/11/2022    1:46 AM  CBC  WBC 4.0 - 10.5 K/uL 8.8  10.8  9.1   Hemoglobin 13.0 - 17.0 g/dL 12.4  12.5  12.2   Hematocrit 39.0 -  52.0 % 36.3  38.6  36.8   Platelets 150 - 400 K/uL 129  115  147     Lab Results  Component Value Date   NA 126 (L) 10/17/2022   K 4.2 10/17/2022   CL 90 (L) 10/17/2022   CO2 24 10/17/2022      Assessment/Plan: Acute hypoxic respiratory failure-multifactorial etiology-right lobar pneumonia/pleural effusion/COPD exacerbation/volume overload in the setting of ESRD/HFrEF exacerbation Has completed a course of antibiotic/steroids-volume removal with HD and is s/p thoracocentesis on 1/24 without any significant improvement.    Unfortunately appears to have end-stage/advanced HFrEF-still with dyspnea with minimal activity Continue supportive care-hemodialysis per nephrology-have consulted palliative care today.  Chronic right pleural effusion Reviewed chart-prior notes-appears to have a chronic right pleural effusion-that has been tapped in the past (has undergone T thoracentesis September and November 2023-transudate) this is felt to be due to HFrEF and ESRD Due to persistent SOB in spite of aggressive dialysis-underwent a thoracocentesis on 1/24-without any significant improvement in his dyspnea-he is now thought to have advanced probably end-stage HFrEF at this point.    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 Long discussion with patient-he understands  his overall tenuous situation-agreeable for DNR. Palliative consulted today.  New onset a flutter Rate controlled with beta-blocker Started Eliquis by cardiology on 1/24  ESRD on HD TTS Nephrology following  Hyponatremia Persistent hyponatremia in the setting of advanced HFrEF-dietary noncompliance to fluid restriction-ESRD Volume removal with HD Emphasize fluid restriction Follow electrolytes periodically  Hypomagnesemia Repleted  HTN BP currently soft in the low 086P systolic Resume losartan when able Continue metoprolol  HLD Statin  Thrombocytopenia Mild Seems to be a chronic issue ongoing for the past several years. Repeat CBC periodically.  Desquamation of skin of bilateral lower extremities eucerin ointment Overall much improved over the past several days.  History of polysubstance abuse (heroin/cocaine/marijuana) Claims he has been clean for approximately 5 years.  However UDS positive for cocaine as of March 2023.  However after more discussion-acknowledges that he still has been using cocaine till several years weeks back.  BMI: Estimated body mass index is 27.51 kg/m as calculated from the following:   Height as of this encounter: '5\' 6"'$  (1.676 m).   Weight as of this encounter: 77.3 kg.   Code status:   Code Status: DNR   DVT Prophylaxis: heparin injection 5,000 Units Start: 10/18/2022 2200   Family Communication: Significant other-[phone]-left voicemail on 1/25  Addendum-finally managed to get in touch with April-significant other on 1/25 evening. Also managed to get in touch with patient's daughter Otila Kluver White-[phone] Both aware of tenuous clinical condition-advanced heart failure-DNR status-and plans to discharge to SNF.  Both aware that patient will likely require hospice follow-up at SNF.   Disposition Plan: Status is: Inpatient Remains inpatient appropriate because: Severity of illness.   Planned Discharge Destination:SNF when  bed available.  Medically stable for transfer to SNF when bed available.   Diet: Diet Order             Diet renal with fluid restriction Fluid restriction: Other (see comments); Room service appropriate? Yes; Fluid consistency: Thin  Diet effective now           Diet - low sodium heart healthy                     Antimicrobial agents: Anti-infectives (From admission, onward)    Start     Dose/Rate Route Frequency Ordered Stop   10/12/22 1700  azithromycin (ZITHROMAX) tablet 500 mg        500 mg Oral  Once 10/12/22 1358 10/12/22 1520   10/10/22 1700  azithromycin (ZITHROMAX) 500 mg in sodium chloride 0.9 % 250 mL IVPB  Status:  Discontinued        500 mg 250 mL/hr over 60 Minutes Intravenous Every 24 hours 10/10/22 1429 10/12/22 1358   10/10/22 1200  vancomycin (VANCOREADY) IVPB 750 mg/150 mL  Status:  Discontinued        750 mg 150 mL/hr over 60 Minutes Intravenous Every T-Th-Sa (Hemodialysis) 10/16/2022 2036 10/10/22 0650   10/10/22 0800  cefTRIAXone (ROCEPHIN) 2 g in sodium chloride 0.9 % 100 mL IVPB        2 g 200 mL/hr over 30 Minutes Intravenous Every 24 hours 10/10/22 0650 10/13/22 0925   10/10/22 0800  azithromycin (ZITHROMAX) 500 mg in sodium chloride 0.9 % 250 mL IVPB  Status:  Discontinued        500 mg 250 mL/hr over 60 Minutes Intravenous Every 24  hours 10/10/22 0650 10/10/22 1431   10/09/22 1300  vancomycin (VANCOREADY) IVPB 750 mg/150 mL        750 mg 150 mL/hr over 60 Minutes Intravenous  Once 10/09/22 1219 10/09/22 1434   09/24/2022 2045  ceFEPIme (MAXIPIME) 1 g in sodium chloride 0.9 % 100 mL IVPB  Status:  Discontinued        1 g 200 mL/hr over 30 Minutes Intravenous Daily at bedtime 10/09/2022 2036 10/10/22 0650   10/03/2022 2045  vancomycin (VANCOREADY) IVPB 1500 mg/300 mL        1,500 mg 150 mL/hr over 120 Minutes Intravenous NOW 10/10/2022 2036 10/09/22 0058        MEDICATIONS: Scheduled Meds:  arformoterol  15 mcg Nebulization BID   atorvastatin  40  mg Oral QHS   budesonide (PULMICORT) nebulizer solution  0.25 mg Nebulization BID   Chlorhexidine Gluconate Cloth  6 each Topical Q0600   [START ON 10/18/2022] heparin  3,000 Units Dialysis Once in dialysis   heparin  5,000 Units Subcutaneous Q8H   hydrocerin   Topical TID   ipratropium-albuterol  3 mL Nebulization TID   metoprolol succinate  25 mg Oral Daily   revefenacin  175 mcg Nebulization Daily   Continuous Infusions:   PRN Meds:.albuterol, white petrolatum   I have personally reviewed following labs and imaging studies  LABORATORY DATA: CBC: Recent Labs  Lab 10/11/22 0146 10/12/22 0108 10/15/22 0852  WBC 9.1 10.8* 8.8  HGB 12.2* 12.5* 12.4*  HCT 36.8* 38.6* 36.3*  MCV 97.9 99.2 96.0  PLT 147* 115* 129*     Basic Metabolic Panel: Recent Labs  Lab 10/11/22 0146 10/12/22 0840 10/13/22 0112 10/15/22 0852 10/15/22 1005 10/16/22 1152 10/17/22 0059  NA 131* 128* 129* 119* 126* 128* 126*  K 4.4 3.6 4.1 5.3* 3.9 4.7 4.2  CL 91* 89* 93* 84* 89* 90* 90*  CO2 '25 25 22 22 26 22 24  '$ GLUCOSE 145* 188* 208* 120* 99 113* 80  BUN 29* 51* 34* 63* 35* 53* 59*  CREATININE 3.64* 4.82* 3.70* 5.98* 3.78* 4.76* 5.21*  CALCIUM 8.1* 7.8* 8.1* 8.2* 8.2* 8.2* 8.2*  MG 1.8  --   --   --   --  1.7  --   PHOS 3.4 3.5 2.4* 4.7*  --  4.3 4.5     GFR: Estimated Creatinine Clearance: 11.1 mL/min (A) (by C-G formula based on SCr of 5.21 mg/dL (H)).  Liver Function Tests: Recent Labs  Lab 10/12/22 0840 10/13/22 0112 10/15/22 0852 10/16/22 1152 10/17/22 0059  ALBUMIN 3.2* 3.2* 3.5 3.6 3.4*    No results for input(s): "LIPASE", "AMYLASE" in the last 168 hours. No results for input(s): "AMMONIA" in the last 168 hours.  Coagulation Profile: No results for input(s): "INR", "PROTIME" in the last 168 hours.  Cardiac Enzymes: No results for input(s): "CKTOTAL", "CKMB", "CKMBINDEX", "TROPONINI" in the last 168 hours.  BNP (last 3 results) No results for input(s): "PROBNP" in  the last 8760 hours.  Lipid Profile: Recent Labs    10/17/22 0059  CHOL 161  HDL 81  LDLCALC 71  TRIG 44  CHOLHDL 2.0    Thyroid Function Tests: No results for input(s): "TSH", "T4TOTAL", "FREET4", "T3FREE", "THYROIDAB" in the last 72 hours.  Anemia Panel: No results for input(s): "VITAMINB12", "FOLATE", "FERRITIN", "TIBC", "IRON", "RETICCTPCT" in the last 72 hours.  Urine analysis:    Component Value Date/Time   COLORURINE YELLOW 03/15/2019 [place] 03/15/2019 1644  LABSPEC 1.013 03/15/2019 1644   PHURINE 5.0 03/15/2019 1644   GLUCOSEU 50 (A) 03/15/2019 1644   HGBUR SMALL (A) 03/15/2019 1644   BILIRUBINUR NEGATIVE 03/15/2019 1644   KETONESUR NEGATIVE 03/15/2019 1644   PROTEINUR >=300 (A) 03/15/2019 1644   NITRITE NEGATIVE 03/15/2019 1644   LEUKOCYTESUR NEGATIVE 03/15/2019 1644    Sepsis Labs: Lactic Acid, Venous    Component Value Date/Time   LATICACIDVEN 1.6 06/25/2021 1707    MICROBIOLOGY: Recent Results (from the past 240 hour(s))  Resp panel by RT-PCR (RSV, Flu A&B, Covid) Anterior Nasal Swab     Status: None   Collection Time: 10/05/2022  6:46 PM   Specimen: Anterior Nasal Swab  Result Value Ref Range Status   SARS Coronavirus 2 by RT PCR NEGATIVE NEGATIVE Final    Comment: (NOTE) SARS-CoV-2 target nucleic acids are NOT DETECTED.  The SARS-CoV-2 RNA is generally detectable in upper respiratory specimens during the acute phase of infection. The lowest concentration of SARS-CoV-2 viral copies this assay can detect is 138 copies/mL. A negative result does not preclude SARS-Cov-2 infection and should not be used as the sole basis for treatment or other patient management decisions. A negative result may occur with  improper specimen collection/handling, submission of specimen other than nasopharyngeal swab, presence of viral mutation(s) within the areas targeted by this assay, and inadequate number of viral copies(<138 copies/mL). A  negative result must be combined with clinical observations, patient history, and epidemiological information. The expected result is Negative.  Fact Sheet for Patients:  EntrepreneurPulse.com.au  Fact Sheet for Healthcare Providers:  IncredibleEmployment.be  This test is no t yet approved or cleared by the [place] FDA and  has been authorized for detection and/or diagnosis of SARS-CoV-2 by FDA under an Emergency Use Authorization (EUA). This EUA will remain  in effect (meaning this test can be used) for the duration of the COVID-19 declaration under Section 564(b)(1) of the Act, 21 U.S.C.section 360bbb-3(b)(1), unless the authorization is terminated  or revoked sooner.       Influenza A by PCR NEGATIVE NEGATIVE Final   Influenza B by PCR NEGATIVE NEGATIVE Final    Comment: (NOTE) The Xpert Xpress SARS-CoV-2/FLU/RSV plus assay is intended as an aid in the diagnosis of influenza from Nasopharyngeal swab specimens and should not be used as a sole basis for treatment. Nasal washings and aspirates are unacceptable for Xpert Xpress SARS-CoV-2/FLU/RSV testing.  Fact Sheet for Patients: EntrepreneurPulse.com.au  Fact Sheet for Healthcare Providers: IncredibleEmployment.be  This test is not yet approved or cleared by the [place] FDA and has been authorized for detection and/or diagnosis of SARS-CoV-2 by FDA under an Emergency Use Authorization (EUA). This EUA will remain in effect (meaning this test can be used) for the duration of the COVID-19 declaration under Section 564(b)(1) of the Act, 21 U.S.C. section 360bbb-3(b)(1), unless the authorization is terminated or revoked.     Resp Syncytial Virus by PCR NEGATIVE NEGATIVE Final    Comment: (NOTE) Fact Sheet for Patients: EntrepreneurPulse.com.au  Fact Sheet for Healthcare  Providers: IncredibleEmployment.be  This test is not yet approved or cleared by the [place] FDA and has been authorized for detection and/or diagnosis of SARS-CoV-2 by FDA under an Emergency Use Authorization (EUA). This EUA will remain in effect (meaning this test can be used) for the duration of the COVID-19 declaration under Section 564(b)(1) of the Act, 21 U.S.C. section 360bbb-3(b)(1), unless the authorization is terminated or revoked.  Performed at [facility], [address] [place], [place] [address]   MRSA Next Gen by PCR, Nasal     Status: None   Collection Time: 10/09/22  1:36 PM   Specimen: Nasal Mucosa; Nasal Swab  Result Value Ref Range Status   MRSA by PCR Next Gen NOT DETECTED NOT DETECTED Final    Comment: (NOTE) The GeneXpert MRSA Assay (FDA approved for NASAL specimens only), is one component of a comprehensive MRSA colonization surveillance program. It is not intended to diagnose MRSA infection nor to guide or monitor treatment for MRSA infections. Test performance is not FDA approved in patients less than 3 years old. Performed at [facility], [place] [address] [place], [place] [address]     RADIOLOGY STUDIES/RESULTS: ECHOCARDIOGRAM COMPLETE  Result Date: 10/16/2022    ECHOCARDIOGRAM REPORT   Patient Name:   Joeangel Jeanpaul. Date of Exam: 10/16/2022 Medical Rec #:  [id number]           Height:       66.0 in Accession #:    [id number]          Weight:       172.0 lb Date of Birth:  10/21/1940           BSA:          1.875 m Patient Age:    81 years            BP:           106/82 mmHg Patient Gender: M                   HR:           73 bpm. Exam Location:  Inpatient Procedure: 2D Echo and Intracardiac Opacification Agent Indications:    CHF  History:        Patient has prior history of Echocardiogram examinations, most                 recent 04/20/2019. CAD, COPD, Arrythmias:LBBB; Risk                 Factors:Hypertension and  Diabetes.  Sonographer:    Harvie Junior Referring Phys: [place]  1. Prominent apical LV trabeculations . Left ventricular ejection fraction, by estimation, is <20%. The left ventricle has severely decreased function. The left ventricle demonstrates global hypokinesis. The left ventricular internal cavity size was moderately dilated. There is mild left ventricular hypertrophy. Left ventricular diastolic parameters are consistent with Grade II diastolic dysfunction (pseudonormalization). Elevated left ventricular end-diastolic pressure.  2. Right ventricular systolic function is severely reduced. The right ventricular size is moderately enlarged. There is normal pulmonary artery systolic pressure.  3. Left atrial size was moderately dilated.  4. Right atrial size was moderately dilated.  5. A small pericardial effusion is present. The pericardial effusion is posterior to the left ventricle.  6. The mitral valve is abnormal. Mild mitral valve regurgitation. No evidence of mitral stenosis.  7. TV leaflets are thickened with mal coaptation . Tricuspid valve regurgitation is severe.  8. The aortic valve is tricuspid. There is moderate calcification of the aortic valve. There is moderate thickening of the aortic valve. Aortic valve regurgitation is not visualized. Aortic valve sclerosis/calcification is present, without any evidence of aortic stenosis.  9. The inferior vena cava is normal in size with greater than 50% respiratory variability, suggesting right atrial pressure of 3 mmHg. FINDINGS  Left Ventricle: Prominent apical LV trabeculations. Left  ventricular ejection fraction, by estimation, is <20%. The left ventricle has severely decreased function. The left ventricle demonstrates global hypokinesis. Definity contrast agent was given IV to delineate the left ventricular endocardial borders. The left ventricular internal cavity size was moderately dilated. There is mild left ventricular  hypertrophy. Left ventricular diastolic parameters are consistent with Grade II diastolic dysfunction (pseudonormalization). Elevated left ventricular end-diastolic pressure. Right Ventricle: The right ventricular size is moderately enlarged. No increase in right ventricular wall thickness. Right ventricular systolic function is severely reduced. There is normal pulmonary artery systolic pressure. The tricuspid regurgitant velocity is 2.52 m/s, and with an assumed right atrial pressure of 3 mmHg, the estimated right ventricular systolic pressure is 88.5 mmHg. Left Atrium: Left atrial size was moderately dilated. Right Atrium: Right atrial size was moderately dilated. Pericardium: A small pericardial effusion is present. The pericardial effusion is posterior to the left ventricle. Mitral Valve: The mitral valve is abnormal. There is mild thickening of the mitral valve leaflet(s). Mild mitral valve regurgitation. No evidence of mitral valve stenosis. Tricuspid Valve: TV leaflets are thickened with mal coaptation. The tricuspid valve is normal in structure. Tricuspid valve regurgitation is severe. No evidence of tricuspid stenosis. Aortic Valve: The aortic valve is tricuspid. There is moderate calcification of the aortic valve. There is moderate thickening of the aortic valve. Aortic valve regurgitation is not visualized. Aortic valve sclerosis/calcification is present, without any  evidence of aortic stenosis. Aortic valve mean gradient measures 3.0 mmHg. Aortic valve peak gradient measures 5.4 mmHg. Aortic valve area, by VTI measures 1.44 cm. Pulmonic Valve: The pulmonic valve was normal in structure. Pulmonic valve regurgitation is mild. No evidence of pulmonic stenosis. Aorta: The aortic root is normal in size and structure. Venous: The inferior vena cava is normal in size with greater than 50% respiratory variability, suggesting right atrial pressure of 3 mmHg. IAS/Shunts: No atrial level shunt detected by color  flow Doppler.  LEFT VENTRICLE PLAX 2D LVIDd:         5.60 cm      Diastology LVIDs:         5.30 cm      LV e' medial:    3.88 cm/s LV PW:         1.30 cm      LV E/e' medial:  27.1 LV IVS:        1.30 cm      LV e' lateral:   8.42 cm/s LVOT diam:     2.00 cm      LV E/e' lateral: 12.5 LV SV:         30 LV SV Index:   16 LVOT Area:     3.14 cm                              3D Volume EF: LV Volumes (MOD)            3D EF:        25 % LV vol d, MOD A2C: 293.0 ml LV EDV:       286 ml LV vol d, MOD A4C: 301.0 ml LV ESV:       214 ml LV vol s, MOD A2C: 284.0 ml LV SV:        71 ml LV vol s, MOD A4C: 282.0 ml LV SV MOD A2C:     9.0 ml LV SV MOD A4C:     301.0 ml  LV SV MOD BP:      23.4 ml RIGHT VENTRICLE RV Basal diam:  5.70 cm RV Mid diam:    4.10 cm RV S prime:     6.07 cm/s TAPSE (M-mode): 0.9 cm LEFT ATRIUM              Index        RIGHT ATRIUM           Index LA diam:        4.30 cm  2.29 cm/m   RA Area:     26.60 cm LA Vol (A2C):   129.0 ml 68.78 ml/m  RA Volume:   89.20 ml  47.56 ml/m LA Vol (A4C):   93.6 ml  49.91 ml/m LA Biplane Vol: 112.0 ml 59.72 ml/m  AORTIC VALVE                    PULMONIC VALVE AV Area (Vmax):    1.68 cm     PV Vmax:          0.81 m/s AV Area (Vmean):   1.56 cm     PV Peak grad:     2.6 mmHg AV Area (VTI):     1.44 cm     PR End Diast Vel: 2.63 msec AV Vmax:           115.75 cm/s AV Vmean:          77.550 cm/s AV VTI:            0.209 m AV Peak Grad:      5.4 mmHg AV Mean Grad:      3.0 mmHg LVOT Vmax:         62.00 cm/s LVOT Vmean:        38.550 cm/s LVOT VTI:          0.096 m LVOT/AV VTI ratio: 0.46  AORTA Ao Root diam: 3.50 cm MITRAL VALVE                  TRICUSPID VALVE MV Area (PHT): 4.80 cm       TR Peak grad:   25.4 mmHg MV Decel Time: 158 msec       TR Vmax:        252.00 cm/s MR Peak grad:    52.7 mmHg MR Mean grad:    38.0 mmHg    SHUNTS MR Vmax:         363.00 cm/s  Systemic VTI:  0.10 m MR Vmean:        290.0 cm/s   Systemic Diam: 2.00 cm MR PISA:         2.26 cm MR  PISA Eff ROA: 15 mm MR PISA Radius:  0.60 cm MV E velocity: 105.42 cm/s MV A velocity: 33.10 cm/s MV E/A ratio:  3.19 Jenkins Rouge MD Electronically signed by Jenkins Rouge MD Signature Date/Time: 10/16/2022/5:28:42 PM    Final    DG CHEST PORT 1 VIEW  Result Date: 10/16/2022 CLINICAL DATA:  Status post thoracentesis EXAM: PORTABLE CHEST 1 VIEW COMPARISON:  10/16/22 CXR FINDINGS: Right sided tunneled central venous catheter with the tip at the cavoatrial junction. Redemonstrated enlarged cardiac contours.Persistent right sided pleural effusion, not significantly changed from prior exam. No pneumothorax. Persistent right basilar pulmonary opacity nonspecific, but favored to represent atelectasis. No new focal airspace opacity. No displaced rib fractures. Visualized upper abdomen is unremarkable. IMPRESSION: 1. Persistent right sided pleural effusion, not significantly changed from prior  exam. No pneumothorax. 2. Persistent right basilar pulmonary opacity, nonspecific, but favored to represent atelectasis. Electronically Signed   By: Marin Roberts M.D.   On: 10/16/2022 14:39   DG Chest Port 1V same Day  Result Date: 10/16/2022 CLINICAL DATA:  Shortness of breath EXAM: PORTABLE CHEST 1 VIEW COMPARISON:  Chest x-ray dated October 15, 2022 FINDINGS: Unchanged cardiomegaly. Right chest wall central venous catheter is unchanged in position. Unchanged small to moderate right pleural effusion. Bibasilar atelectasis. No evidence of pneumothorax. IMPRESSION: Small to moderate right pleural effusion and bibasilar atelectasis, unchanged when compared with the prior. Electronically Signed   By: Yetta Glassman M.D.   On: 10/16/2022 09:25   DG CHEST PORT 1 VIEW  Result Date: 10/15/2022 CLINICAL DATA:  Shortness of breath EXAM: PORTABLE CHEST 1 VIEW COMPARISON:  10/10/2022 and older FINDINGS: Underinflation. Enlarged cardiopericardial silhouette. There is a moderate right effusion. No pneumothorax. Vascular congestion.  Tortuous ectatic aorta. There is a right IJ double-lumen catheter with tip at the SVC right atrial junction. The left inferior costophrenic angle is clipped at the edge of the film. Film is under penetrated. IMPRESSION: 1. Cardiomegaly with increasing vascular congestion and moderate right effusion. 2. Right IJ double-lumen catheter with tip at the SVC right atrial junction. Electronically Signed   By: Jill Side M.D.   On: 10/15/2022 14:18     LOS: 9 days   Oren Binet, MD  [facility]    To contact the attending provider between 7A-7P or the covering provider during after hours 7P-7A, please log into the web site [email or web address] and access using universal [place] password for that web site. If you do not have the password, please call the hospital operator.  10/17/2022, 10:30 AM

## 2022-10-17 NOTE — Progress Notes (Signed)
Received patient in bed to unit.  Alert and oriented.  Informed consent signed and in chart.   Treatment initiated: 0807 Treatment completed: 1139  Patient tolerated well.  Transported back to the room  Alert, without acute distress.  Hand-off given to patient's nurse.   Access used: catheter Access issues: none  Total UF removed: 3000 Medication(s) given: none Post HD VS: 96.9, 90/73(80), HR-100, RR-19, Sp02-96 Post HD weight: 75.9kg    Pt threw multiple multifocal PVCs. A few were 5-6 runs which was recognized as [facility]

## 2022-10-17 NOTE — Progress Notes (Signed)
Heart Failure Navigator Progress Note  Assessed for Heart & Vascular TOC clinic readiness.  Patient does not meet criteria due to ESRD on hemodialysis.   Navigator will sign off at this time.    Gerhardt Gleed, BSN, RN Heart Failure Nurse Navigator Secure Chat Only   

## 2022-10-17 NOTE — Progress Notes (Signed)
Patient removed Cpap.  Asked RN where his camera was.  RN redirected patient to his location at [facility].  Patient then asked to use RN's camera.  Again redirected.  Patient given sips of water and placed back on nasal canula.

## 2022-10-18 ENCOUNTER — Other Ambulatory Visit (HOSPITAL_COMMUNITY): Payer: Self-pay

## 2022-10-18 DIAGNOSIS — Z515 Encounter for palliative care: Secondary | ICD-10-CM | POA: Diagnosis not present

## 2022-10-18 DIAGNOSIS — I484 Atypical atrial flutter: Secondary | ICD-10-CM

## 2022-10-18 DIAGNOSIS — Z7189 Other specified counseling: Secondary | ICD-10-CM

## 2022-10-18 DIAGNOSIS — R0603 Acute respiratory distress: Secondary | ICD-10-CM | POA: Diagnosis not present

## 2022-10-18 DIAGNOSIS — I5084 End stage heart failure: Secondary | ICD-10-CM | POA: Diagnosis not present

## 2022-10-18 DIAGNOSIS — I5043 Acute on chronic combined systolic (congestive) and diastolic (congestive) heart failure: Secondary | ICD-10-CM | POA: Diagnosis not present

## 2022-10-18 LAB — RENAL FUNCTION PANEL
Albumin: 3.3 g/dL — ABNORMAL LOW (ref 3.5–5.0)
Anion gap: 14 (ref 5–15)
BUN: 37 mg/dL — ABNORMAL HIGH (ref 8–23)
CO2: 26 mmol/L (ref 22–32)
Calcium: 8.4 mg/dL — ABNORMAL LOW (ref 8.9–10.3)
Chloride: 88 mmol/L — ABNORMAL LOW (ref 98–111)
Creatinine, Ser: 3.88 mg/dL — ABNORMAL HIGH (ref 0.61–1.24)
GFR, Estimated: 15 mL/min — ABNORMAL LOW (ref >60)
Glucose, Bld: 86 mg/dL (ref 70–99)
Phosphorus: 3.9 mg/dL (ref 2.5–4.6)
Potassium: 4.1 mmol/L (ref 3.5–5.1)
Sodium: 128 mmol/L — ABNORMAL LOW (ref 135–145)

## 2022-10-18 LAB — GLUCOSE, CAPILLARY: Glucose-Capillary: 122 mg/dL — ABNORMAL HIGH (ref 70–99)

## 2022-10-18 MED ORDER — HEPARIN SODIUM (PORCINE) 1000 UNIT/ML IJ SOLN
INTRAMUSCULAR | Status: AC
  Administered 2022-10-18: 3000 [IU]
  Filled 2022-10-18: qty 3

## 2022-10-18 MED ORDER — METOPROLOL SUCCINATE ER 25 MG PO TB24: 25.0000 mg | ORAL_TABLET | Freq: Every day | ORAL | Status: AC

## 2022-10-18 MED ORDER — APIXABAN 2.5 MG PO TABS: 2.5000 mg | ORAL_TABLET | Freq: Two times a day (BID) | ORAL | Status: AC

## 2022-10-18 MED ORDER — HEPARIN SODIUM (PORCINE) 1000 UNIT/ML IJ SOLN
INTRAMUSCULAR | Status: AC
  Administered 2022-10-18: 4200 [IU]
  Filled 2022-10-18: qty 5

## 2022-10-18 NOTE — Progress Notes (Addendum)
PROGRESS NOTE        PATIENT DETAILS Name: [name]. Age: 82 y.o. Sex: male Date of Birth: 09-24-1940 Admit Date: 09/26/2022 Admitting Physician Kayleen Memos, DO ULA:[email or web address], [place], MD  Brief Summary: Patient is a 82 y.o.  male with history of ESRD on HD TTS, HFrEF, COPD, HTN, HLD who presented with SOB x 1 day.  Patient was found to have acute hypoxic respiratory failure-requiring BiPAP on initial presentation.  Significant events: 1/16>> SOB-hypoxia-requiring BiPAP.  Last HD on 1/16 as an outpatient. 1/24>>developed worsening SOB-non compliant with Fluid restrictions-PCCM/Cards consult  Significant studies: 1/16>> CXR: Moderate right pleural effusion-right basilar consolidation 1/24>> echo: EH<21%, RV systolic function severely reduced.  Significant microbiology data: 1/16>> COVID/influenza/RSV PCR: Negative  Procedures: 1/24>> thoracocentesis  Consults: Nephrology PCCM Cardiology  Subjective: Lying comfortably in bed when seen earlier.  Awake/alert.  Continues to get short of breath with minimal activity.  Objective: Vitals: Blood pressure 91/66, pulse 84, temperature 98 F (36.7 C), temperature source Oral, resp. rate 19, height '5\' 6"'$  (1.676 m), weight 76.5 kg, SpO2 97 %.   Exam: Gen Exam:Alert awake-not in any distress HEENT:atraumatic, normocephalic Chest: B/L clear to auscultation anteriorly CVS:S1S2 regular Abdomen:soft non tender, non distended Extremities:no edema Neurology: Non focal Skin: no rash  Pertinent Labs/Radiology:    Latest Ref Rng & Units 10/15/2022    8:52 AM 10/12/2022    1:08 AM 10/11/2022    1:46 AM  CBC  WBC 4.0 - 10.5 K/uL 8.8  10.8  9.1   Hemoglobin 13.0 - 17.0 g/dL 12.4  12.5  12.2   Hematocrit 39.0 - 52.0 % 36.3  38.6  36.8   Platelets 150 - 400 K/uL 129  115  147     Lab Results  Component Value Date   NA 128 (L) 10/18/2022   K 4.1 10/18/2022   CL 88 (L) 10/18/2022   CO2 26 10/18/2022       Assessment/Plan: Acute hypoxic respiratory failure-multifactorial etiology-right lobar pneumonia/pleural effusion/COPD exacerbation/volume overload in the setting of ESRD/HFrEF exacerbation Has completed a course of antibiotic/steroids-volume removal with HD and is s/p thoracocentesis on 1/24 without any significant improvement.    Unfortunately appears to have end-stage/advanced HFrEF-still with dyspnea with minimal activity He still wants to continue with hemodialysis-await palliative care evaluation for goals of care.  He is however a DNR.   Chronic right pleural effusion Reviewed chart-prior notes-appears to have a chronic right pleural effusion-that has been tapped in the past (has undergone T thoracentesis September and November 2023-transudate) this is felt to be due to HFrEF and ESRD Due to persistent SOB in spite of aggressive dialysis-underwent a thoracocentesis on 1/24-without any significant improvement in his dyspnea-he is now thought to have advanced probably end-stage HFrEF at this point.     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 Long discussion with patient-over the  past several days he understands his overall tenuous situation-now a DNR-but wants to continue with hemodialysis for now.    New onset a flutter Rate controlled with  beta-blocker Started Eliquis by cardiology on 1/24   ESRD on HD TTS Nephrology following Per nephrologist-hard to dialyze-soft BP-will await further recommendations from palliative care team.  If patient elects to stop HD-then he would benefit from discharge to residential hospice at [facility].   Hyponatremia Persistent hyponatremia in the setting of advanced HFrEF-dietary noncompliance to fluid restriction-ESRD Volume removal with HD Emphasize fluid restriction Follow electrolytes periodically   Hypomagnesemia Repleted   HTN BP currently soft in the low 696E systolic Resume losartan when able Continue metoprolol   HLD Statin   Thrombocytopenia Mild Seems to be a chronic issue ongoing for the past several years. Repeat CBC periodically.   Desquamation of skin of bilateral lower extremities eucerin ointment Overall much improved over the past several days.   History of polysubstance abuse (heroin/cocaine/marijuana) Claims he has been clean for approximately 5 years.  However UDS positive for cocaine as of March 2023.     BMI: Estimated body mass index is 27.51 kg/m as calculated from the following:   Height as of this encounter: '5\' 6"'$  (1.676 m).   Weight as of this encounter: 77.3 kg.     Code status:   Code Status: DNR   DVT Prophylaxis: apixaban (ELIQUIS) tablet 2.5 mg Start: 10/17/22 1745 apixaban (ELIQUIS) tablet 2.5 mg   Family Communication: Significant other-April-[phone] and daughter Otila Kluver White-[phone] on 1/25.   Disposition Plan: Status is: Inpatient Remains inpatient appropriate because: Severity of illness.   Planned Discharge Destination:SNF when bed available   Diet: Diet Order             Diet renal with fluid restriction Fluid restriction: Other (see comments); Room service appropriate? Yes; Fluid consistency: Thin  Diet effective now           Diet - low sodium heart healthy                     Antimicrobial  agents: Anti-infectives (From admission, onward)    Start     Dose/Rate Route Frequency Ordered Stop   10/12/22 1700  azithromycin (ZITHROMAX) tablet 500 mg        500 mg Oral  Once 10/12/22 1358 10/12/22 1520   10/10/22 1700  azithromycin (ZITHROMAX) 500 mg in sodium chloride 0.9 % 250 mL IVPB  Status:  Discontinued        500 mg 250 mL/hr over 60 Minutes Intravenous Every 24 hours 10/10/22 1429 10/12/22 1358   10/10/22 1200  vancomycin (VANCOREADY) IVPB 750 mg/150 mL  Status:  Discontinued        750 mg 150 mL/hr over 60 Minutes Intravenous Every T-Th-Sa (Hemodialysis) 10/12/2022 2036 10/10/22 0650   10/10/22 0800  cefTRIAXone (ROCEPHIN) 2 g in sodium chloride 0.9 % 100 mL IVPB        2 g 200 mL/hr over 30 Minutes Intravenous Every 24 hours 10/10/22 0650 10/13/22 0925   10/10/22 0800  azithromycin (ZITHROMAX) 500 mg in sodium chloride 0.9 % 250 mL IVPB  Status:  Discontinued        500 mg 250 mL/hr over 60 Minutes Intravenous Every 24 hours 10/10/22 0650 10/10/22 1431   10/09/22 1300  vancomycin (VANCOREADY) IVPB 750 mg/150 mL        750 mg 150 mL/hr over 60 Minutes Intravenous  Once 10/09/22 1219 10/09/22 1434  09/25/2022 2045  ceFEPIme (MAXIPIME) 1 g in sodium chloride 0.9 % 100 mL IVPB  Status:  Discontinued        1 g 200 mL/hr over 30 Minutes Intravenous Daily at bedtime 09/23/2022 2036 10/10/22 0650   10/22/2022 2045  vancomycin (VANCOREADY) IVPB 1500 mg/300 mL        1,500 mg 150 mL/hr over 120 Minutes Intravenous NOW 09/27/2022 2036 10/09/22 0058        MEDICATIONS: Scheduled Meds:  apixaban  2.5 mg Oral BID   arformoterol  15 mcg Nebulization BID   atorvastatin  40 mg Oral QHS   budesonide (PULMICORT) nebulizer solution  0.25 mg Nebulization BID   Chlorhexidine Gluconate Cloth  6 each Topical Q0600   hydrocerin   Topical TID   ipratropium-albuterol  3 mL Nebulization TID   metoprolol succinate  25 mg Oral Daily   revefenacin  175 mcg Nebulization Daily   Continuous  Infusions:   PRN Meds:.albuterol, white petrolatum   I have personally reviewed following labs and imaging studies  LABORATORY DATA: CBC: Recent Labs  Lab 10/12/22 0108 10/15/22 0852  WBC 10.8* 8.8  HGB 12.5* 12.4*  HCT 38.6* 36.3*  MCV 99.2 96.0  PLT 115* 129*     Basic Metabolic Panel: Recent Labs  Lab 10/13/22 0112 10/15/22 0852 10/15/22 1005 10/16/22 1152 10/17/22 0059 10/18/22 0010  NA 129* 119* 126* 128* 126* 128*  K 4.1 5.3* 3.9 4.7 4.2 4.1  CL 93* 84* 89* 90* 90* 88*  CO2 '22 22 26 22 24 26  '$ GLUCOSE 208* 120* 99 113* 80 86  BUN 34* 63* 35* 53* 59* 37*  CREATININE 3.70* 5.98* 3.78* 4.76* 5.21* 3.88*  CALCIUM 8.1* 8.2* 8.2* 8.2* 8.2* 8.4*  MG  --   --   --  1.7  --   --   PHOS 2.4* 4.7*  --  4.3 4.5 3.9     GFR: Estimated Creatinine Clearance: 13.7 mL/min (A) (by C-G formula based on SCr of 3.88 mg/dL (H)).  Liver Function Tests: Recent Labs  Lab 10/13/22 0112 10/15/22 0852 10/16/22 1152 10/17/22 0059 10/18/22 0010  ALBUMIN 3.2* 3.5 3.6 3.4* 3.3*    No results for input(s): "LIPASE", "AMYLASE" in the last 168 hours. No results for input(s): "AMMONIA" in the last 168 hours.  Coagulation Profile: No results for input(s): "INR", "PROTIME" in the last 168 hours.  Cardiac Enzymes: No results for input(s): "CKTOTAL", "CKMB", "CKMBINDEX", "TROPONINI" in the last 168 hours.  BNP (last 3 results) No results for input(s): "PROBNP" in the last 8760 hours.  Lipid Profile: Recent Labs    10/17/22 0059  CHOL 161  HDL 81  LDLCALC 71  TRIG 44  CHOLHDL 2.0     Thyroid Function Tests: No results for input(s): "TSH", "T4TOTAL", "FREET4", "T3FREE", "THYROIDAB" in the last 72 hours.  Anemia Panel: No results for input(s): "VITAMINB12", "FOLATE", "FERRITIN", "TIBC", "IRON", "RETICCTPCT" in the last 72 hours.  Urine analysis:    Component Value Date/Time   COLORURINE YELLOW 03/15/2019 1644   APPEARANCEUR CLEAR 03/15/2019 1644   LABSPEC 1.013  03/15/2019 1644   PHURINE 5.0 03/15/2019 1644   GLUCOSEU 50 (A) 03/15/2019 1644   HGBUR SMALL (A) 03/15/2019 1644   BILIRUBINUR NEGATIVE 03/15/2019 1644   KETONESUR NEGATIVE 03/15/2019 1644   PROTEINUR >=300 (A) 03/15/2019 1644   NITRITE NEGATIVE 03/15/2019 1644   LEUKOCYTESUR NEGATIVE 03/15/2019 1644    Sepsis Labs: Lactic Acid, Venous    Component Value Date/Time  LATICACIDVEN 1.6 06/25/2021 1707    MICROBIOLOGY: Recent Results (from the past 240 hour(s))  Resp panel by RT-PCR (RSV, Flu A&B, Covid) Anterior Nasal Swab     Status: None   Collection Time: 09/24/2022  6:46 PM   Specimen: Anterior Nasal Swab  Result Value Ref Range Status   SARS Coronavirus 2 by RT PCR NEGATIVE NEGATIVE Final    Comment: (NOTE) SARS-CoV-2 target nucleic acids are NOT DETECTED.  The SARS-CoV-2 RNA is generally detectable in upper respiratory specimens during the acute phase of infection. The lowest concentration of SARS-CoV-2 viral copies this assay can detect is 138 copies/mL. A negative result does not preclude SARS-Cov-2 infection and should not be used as the sole basis for treatment or other patient management decisions. A negative result may occur with  improper specimen collection/handling, submission of specimen other than nasopharyngeal swab, presence of viral mutation(s) within the areas targeted by this assay, and inadequate number of viral copies(<138 copies/mL). A negative result must be combined with clinical observations, patient history, and epidemiological information. The expected result is Negative.  Fact Sheet for Patients:  EntrepreneurPulse.com.au  Fact Sheet for Healthcare Providers:  IncredibleEmployment.be  This test is no t yet approved or cleared by the [place] FDA and  has been authorized for detection and/or diagnosis of SARS-CoV-2 by FDA under an Emergency Use Authorization (EUA). This EUA will remain  in effect  (meaning this test can be used) for the duration of the COVID-19 declaration under Section 564(b)(1) of the Act, 21 U.S.C.section 360bbb-3(b)(1), unless the authorization is terminated  or revoked sooner.       Influenza A by PCR NEGATIVE NEGATIVE Final   Influenza B by PCR NEGATIVE NEGATIVE Final    Comment: (NOTE) The Xpert Xpress SARS-CoV-2/FLU/RSV plus assay is intended as an aid in the diagnosis of influenza from Nasopharyngeal swab specimens and should not be used as a sole basis for treatment. Nasal washings and aspirates are unacceptable for Xpert Xpress SARS-CoV-2/FLU/RSV testing.  Fact Sheet for Patients: EntrepreneurPulse.com.au  Fact Sheet for Healthcare Providers: IncredibleEmployment.be  This test is not yet approved or cleared by the [place] FDA and has been authorized for detection and/or diagnosis of SARS-CoV-2 by FDA under an Emergency Use Authorization (EUA). This EUA will remain in effect (meaning this test can be used) for the duration of the COVID-19 declaration under Section 564(b)(1) of the Act, 21 U.S.C. section 360bbb-3(b)(1), unless the authorization is terminated or revoked.     Resp Syncytial Virus by PCR NEGATIVE NEGATIVE Final    Comment: (NOTE) Fact Sheet for Patients: EntrepreneurPulse.com.au  Fact Sheet for Healthcare Providers: IncredibleEmployment.be  This test is not yet approved or cleared by the [place] FDA and has been authorized for detection and/or diagnosis of SARS-CoV-2 by FDA under an Emergency Use Authorization (EUA). This EUA will remain in effect (meaning this test can be used) for the duration of the COVID-19 declaration under Section 564(b)(1) of the Act, 21 U.S.C. section 360bbb-3(b)(1), unless the authorization is terminated or revoked.  Performed at [facility], [place] [address], [place], [place] [address]   MRSA Next Gen by PCR,  Nasal     Status: None   Collection Time: 10/09/22  1:36 PM   Specimen: Nasal Mucosa; Nasal Swab  Result Value Ref Range Status   MRSA by PCR Next Gen NOT DETECTED NOT DETECTED Final    Comment: (NOTE) The GeneXpert MRSA Assay (FDA approved for NASAL specimens only), is one component  of a comprehensive MRSA colonization surveillance program. It is not intended to diagnose MRSA infection nor to guide or monitor treatment for MRSA infections. Test performance is not FDA approved in patients less than 57 years old. Performed at [facility], [place] [address] [place],  [address]     RADIOLOGY STUDIES/RESULTS: ECHOCARDIOGRAM COMPLETE  Result Date: 10/16/2022    ECHOCARDIOGRAM REPORT   Patient Name:   [name]. Date of Exam: 10/16/2022 Medical Rec #:  [id number]           Height:       66.0 in Accession #:    [id number]          Weight:       172.0 lb Date of Birth:  09-Sep-1941           BSA:          1.875 m Patient Age:    58 years            BP:           106/82 mmHg Patient Gender: M                   HR:           73 bpm. Exam Location:  Inpatient Procedure: 2D Echo and Intracardiac Opacification Agent Indications:    CHF  History:        Patient has prior history of Echocardiogram examinations, most                 recent 04/20/2019. CAD, COPD, Arrythmias:LBBB; Risk                 Factors:Hypertension and Diabetes.  Sonographer:    Harvie Junior Referring Phys: [place]  1. Prominent apical LV trabeculations . Left ventricular ejection fraction, by estimation, is <20%. The left ventricle has severely decreased function. The left ventricle demonstrates global hypokinesis. The left ventricular internal cavity size was moderately dilated. There is mild left ventricular hypertrophy. Left ventricular diastolic parameters are consistent with Grade II diastolic dysfunction (pseudonormalization). Elevated left ventricular end-diastolic pressure.  2. Right  ventricular systolic function is severely reduced. The right ventricular size is moderately enlarged. There is normal pulmonary artery systolic pressure.  3. Left atrial size was moderately dilated.  4. Right atrial size was moderately dilated.  5. A small pericardial effusion is present. The pericardial effusion is posterior to the left ventricle.  6. The mitral valve is abnormal. Mild mitral valve regurgitation. No evidence of mitral stenosis.  7. TV leaflets are thickened with mal coaptation . Tricuspid valve regurgitation is severe.  8. The aortic valve is tricuspid. There is moderate calcification of the aortic valve. There is moderate thickening of the aortic valve. Aortic valve regurgitation is not visualized. Aortic valve sclerosis/calcification is present, without any evidence of aortic stenosis.  9. The inferior vena cava is normal in size with greater than 50% respiratory variability, suggesting right atrial pressure of 3 mmHg. FINDINGS  Left Ventricle: Prominent apical LV trabeculations. Left ventricular ejection fraction, by estimation, is <20%. The left ventricle has severely decreased function. The left ventricle demonstrates global hypokinesis. Definity contrast agent was given IV to delineate the left ventricular endocardial borders. The left ventricular internal cavity size was moderately dilated. There is mild left ventricular hypertrophy. Left ventricular diastolic parameters are consistent with Grade II diastolic dysfunction (pseudonormalization). Elevated left ventricular end-diastolic pressure. Right Ventricle: The right ventricular size  is moderately enlarged. No increase in right ventricular wall thickness. Right ventricular systolic function is severely reduced. There is normal pulmonary artery systolic pressure. The tricuspid regurgitant velocity is 2.52 m/s, and with an assumed right atrial pressure of 3 mmHg, the estimated right ventricular systolic pressure is 82.5 mmHg. Left Atrium:  Left atrial size was moderately dilated. Right Atrium: Right atrial size was moderately dilated. Pericardium: A small pericardial effusion is present. The pericardial effusion is posterior to the left ventricle. Mitral Valve: The mitral valve is abnormal. There is mild thickening of the mitral valve leaflet(s). Mild mitral valve regurgitation. No evidence of mitral valve stenosis. Tricuspid Valve: TV leaflets are thickened with mal coaptation. The tricuspid valve is normal in structure. Tricuspid valve regurgitation is severe. No evidence of tricuspid stenosis. Aortic Valve: The aortic valve is tricuspid. There is moderate calcification of the aortic valve. There is moderate thickening of the aortic valve. Aortic valve regurgitation is not visualized. Aortic valve sclerosis/calcification is present, without any  evidence of aortic stenosis. Aortic valve mean gradient measures 3.0 mmHg. Aortic valve peak gradient measures 5.4 mmHg. Aortic valve area, by VTI measures 1.44 cm. Pulmonic Valve: The pulmonic valve was normal in structure. Pulmonic valve regurgitation is mild. No evidence of pulmonic stenosis. Aorta: The aortic root is normal in size and structure. Venous: The inferior vena cava is normal in size with greater than 50% respiratory variability, suggesting right atrial pressure of 3 mmHg. IAS/Shunts: No atrial level shunt detected by color flow Doppler.  LEFT VENTRICLE PLAX 2D LVIDd:         5.60 cm      Diastology LVIDs:         5.30 cm      LV e' medial:    3.88 cm/s LV PW:         1.30 cm      LV E/e' medial:  27.1 LV IVS:        1.30 cm      LV e' lateral:   8.42 cm/s LVOT diam:     2.00 cm      LV E/e' lateral: 12.5 LV SV:         30 LV SV Index:   16 LVOT Area:     3.14 cm                              3D Volume EF: LV Volumes (MOD)            3D EF:        25 % LV vol d, MOD A2C: 293.0 ml LV EDV:       286 ml LV vol d, MOD A4C: 301.0 ml LV ESV:       214 ml LV vol s, MOD A2C: 284.0 ml LV SV:        71  ml LV vol s, MOD A4C: 282.0 ml LV SV MOD A2C:     9.0 ml LV SV MOD A4C:     301.0 ml LV SV MOD BP:      23.4 ml RIGHT VENTRICLE RV Basal diam:  5.70 cm RV Mid diam:    4.10 cm RV S prime:     6.07 cm/s TAPSE (M-mode): 0.9 cm LEFT ATRIUM              Index        RIGHT ATRIUM  Index LA diam:        4.30 cm  2.29 cm/m   RA Area:     26.60 cm LA Vol (A2C):   129.0 ml 68.78 ml/m  RA Volume:   89.20 ml  47.56 ml/m LA Vol (A4C):   93.6 ml  49.91 ml/m LA Biplane Vol: 112.0 ml 59.72 ml/m  AORTIC VALVE                    PULMONIC VALVE AV Area (Vmax):    1.68 cm     PV Vmax:          0.81 m/s AV Area (Vmean):   1.56 cm     PV Peak grad:     2.6 mmHg AV Area (VTI):     1.44 cm     PR End Diast Vel: 2.63 msec AV Vmax:           115.75 cm/s AV Vmean:          77.550 cm/s AV VTI:            0.209 m AV Peak Grad:      5.4 mmHg AV Mean Grad:      3.0 mmHg LVOT Vmax:         62.00 cm/s LVOT Vmean:        38.550 cm/s LVOT VTI:          0.096 m LVOT/AV VTI ratio: 0.46  AORTA Ao Root diam: 3.50 cm MITRAL VALVE                  TRICUSPID VALVE MV Area (PHT): 4.80 cm       TR Peak grad:   25.4 mmHg MV Decel Time: 158 msec       TR Vmax:        252.00 cm/s MR Peak grad:    52.7 mmHg MR Mean grad:    38.0 mmHg    SHUNTS MR Vmax:         363.00 cm/s  Systemic VTI:  0.10 m MR Vmean:        290.0 cm/s   Systemic Diam: 2.00 cm MR PISA:         2.26 cm MR PISA Eff ROA: 15 mm MR PISA Radius:  0.60 cm MV E velocity: 105.42 cm/s MV A velocity: 33.10 cm/s MV E/A ratio:  3.19 Jenkins Rouge MD Electronically signed by Jenkins Rouge MD Signature Date/Time: 10/16/2022/5:28:42 PM    Final    DG CHEST PORT 1 VIEW  Result Date: 10/16/2022 CLINICAL DATA:  Status post thoracentesis EXAM: PORTABLE CHEST 1 VIEW COMPARISON:  10/16/22 CXR FINDINGS: Right sided tunneled central venous catheter with the tip at the cavoatrial junction. Redemonstrated enlarged cardiac contours.Persistent right sided pleural effusion, not significantly changed  from prior exam. No pneumothorax. Persistent right basilar pulmonary opacity nonspecific, but favored to represent atelectasis. No new focal airspace opacity. No displaced rib fractures. Visualized upper abdomen is unremarkable. IMPRESSION: 1. Persistent right sided pleural effusion, not significantly changed from prior exam. No pneumothorax. 2. Persistent right basilar pulmonary opacity, nonspecific, but favored to represent atelectasis. Electronically Signed   By: Marin Roberts M.D.   On: 10/16/2022 14:39     LOS: 10 days   Oren Binet, MD  [facility]    To contact the attending provider between 7A-7P or the covering provider during after hours 7P-7A, please log into the web site [email or web address] and access using universal [place]  password for that web site. If you do not have the password, please call the hospital operator.  10/18/2022, 10:14 AM

## 2022-10-18 NOTE — Progress Notes (Signed)
Received patient in bed to unit.  Alert and oriented.  Informed consent signed and in chart.   Treatment initiated: [place] Treatment completed: 1842  Patient tolerated well.  Transported back to the room  Alert, without acute distress.  Hand-off given to patient's nurse.   Access used: Catheter Access issues: none  Total UF removed: 2.5L Medication(s) given: none Post HD VS: [phone] Post HD weight: 71.1kg   [facility]

## 2022-10-18 NOTE — Consult Note (Addendum)
Palliative Medicine Inpatient Consult Note  Consulting Provider:  Jonetta Osgood, MD   Reason for consult:   [place] Palliative Medicine Consult  Reason for Consult? goc   10/18/2022  HPI:  Per intake H&P --> Patient is a 82 y.o.  male with history of ESRD on HD TTS, HFrEF (EF < 20%), COPD, HTN, HLD who was admitted for shortness of breath. Palliative care has been asked to get involved for further conversation regarding goals of care in the setting of end stage heart failure.   Clinical Assessment/Goals of Care:  *Please note that this is a verbal dictation therefore any spelling or grammatical errors are due to the "[place]" system interpretation.  I have reviewed medical records including EPIC notes, labs and imaging, received report from bedside RN, assessed the patient who is lying in bed generally uncomfortable in appearance. .    I met with [name] to further discuss diagnosis prognosis, GOC, EOL wishes, disposition and options.   I introduced Palliative Medicine as specialized medical care for people living with serious illness. It focuses on providing relief from the symptoms and stress of a serious illness. The goal is to improve quality of life for both the patient and the family.  Medical History Review and Understanding:  I discussed with [name] his prior medical history of end-stage renal disease, COPD, hypertension, coronary artery disease, and end-stage heart failure.  Social History:  He would vocalizes that he was born in [place] and moved to [place] where he lived for 40 years prior to moving back to the [place] area.  He was in the [employer] and was deployed.  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  He has a long-term girlfriend with whom he has been for the past decade.  He shares that he has had a life complete with friends and faith.  He practices  within Christianity.  Functional and Nutritional State:  Prior to hospitalization [name] was living independently in an apartment with long time girlfriend, [name].  He needed help with showering but had an aide coming in weekly.  He was able to dress himself and fix himself meals such as oatmeal and microwavable meals.  He was able to eat by himself.  He did require with a cane and a walker depending on the situation for ambulation.     Advance Directives:  A detailed discussion was had today regarding advanced directives.  Patient does not have these at this time though defers to his daughter if big decisions need to be made.  Code Status:  Concepts specific to code status, artifical feeding and hydration, continued IV antibiotics and rehospitalization was had.  The difference between a aggressive medical intervention path  and a palliative comfort care path for this patient at this time was had.   Encouraged patient/family to consider DNR/DNI status understanding evidenced based poor outcomes in similar hospitalized patient, as the cause of arrest is likely associated with advanced chronic/terminal illness rather than an easily reversible acute cardio-pulmonary event. I explained that DNR/DNI does not change the medical plan and it only comes into effect after a person has arrested (died).  It is a protective measure to keep [place] from harming the patient in their last moments of life.   [name] is agreeable that if his heart stops and he is no longer breathing he would not want to go through artificial modalities such as life support  to sustain.  He would be accepting of God taking him  Discussion:  [name] and I had open and honest conversations in the setting of his disease processes.  I shared that based upon his ejection fraction and his activity tolerance he is encroaching that final stages of heart failure.  I reviewed with him the various stages in accordance with the [place] heart  association classification system.  We discussed that it may get to a point with his disease that he is unable to tolerate dialysis and/or it does not have the desired effects on his body/symptoms.  I presented to paths moving forward for [facility].  The first path is continuing what we are doing now inclusive of him going to skilled nursing where he may improve but he also may worsen very quickly.  We discussed the continuation of hemodialysis.  The alternative path was that of comfort.  I shared that if that is elected we would institute the involvement of hospice care. I described hospice as a service for patients who have a life expectancy of 6 months or less. The goal of hospice is the preservation of dignity and quality at the end phases of life. Under hospice care, the focus changes from curative to symptom relief.  I shared that for [facility] he would be inpatient hospice as stopping hemodialysis places him within a 7 to 10-day window of life left to live.  [name] acknowledges the disease burden he is endorsing right now though shares that he still has a desire to live.  He vocalizes that he is not yet ready to stop dialysis treatment or enroll in hospice.  [name] is open to outpatient palliative support and does understand skilled nursing may not improve his situation.  If he should bounce back to the hospital I emphasized the importance of further consideration of hospice care, as unfortunately that would be his body's way of showing him the severity of his disease.  Plan for [name] to transition to [facility] with outpatient palliative support through [place].  Discussed the importance of continued conversation with family and their  medical providers regarding overall plan of care and treatment options, ensuring decisions are within the context of the patients values and GOCs. ___________________________________ Addendum:  I was able to speak with [name] long-term girlfriend, [name]  and update her on the above conversation.  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  I did share with her that even the strongest individual cannot prevent diseases such as heart failure from progressing.  She understood and is in alignment with the plan for rehabilitation.  Decision Maker: [name] Daughter [phone]  SUMMARY OF RECOMMENDATIONS   DNAR/DNI  Described the options at this time for [facility] which are limited due to his advanced disease  Broached the topic of hospice  Santez would like to continue with present scope of care inclusive of dialysis treatments  Plan for transition to [facility] rehabilitation with outpatient palliative support through [place]  PMT will continue to follow while inpatient  Code Status/Advance Care Planning: DNAR/DNI   Palliative Prophylaxis:  Aspiration, Bowel Regimen, Delirium Protocol, Frequent Pain Assessment, Oral Care, Palliative Wound Care, and Turn Reposition  Additional Recommendations (Limitations, Scope, Preferences): Continue the plan of treating what is treatable  Psycho-social/Spiritual:  Desire for further Chaplaincy support: Yes patient is [name] and would like to pray with a chaplain Additional Recommendations: Discussion of patient's disease and the progressive nature of it   Prognosis: Patient would be appropriate for hospice care if  he could no longer tolerate dialysis or determined he no longer wanted to receive dialysis in the setting of his end-stage heart failure  Discharge Planning: Discharge to [facility] rehabilitation with outpatient palliative support through [place].  Vitals:   10/17/22 2047 10/18/22 0408  BP: 90/74 98/65  Pulse: 87 84  Resp: 17 17  Temp: 98 F (36.7 C) 98.4 F (36.9 C)  SpO2: 100% 98%    Intake/Output Summary (Last 24 hours) at 10/18/2022 2202 Last data filed at 10/17/2022 1146 Gross per 24 hour  Intake --  Output 3000 ml  Net -3000 ml   Last Weight  Most recent  update: 10/18/2022  5:43 AM    Weight  76.5 kg (168 lb 10.4 oz)            Gen: Frail elderly African-American male in moderate distress HEENT: moist mucous membranes CV: Regular rate and rhythm PULM: On 3 L nasal cannula breathing is slightly labored ABD: soft/nontender  EXT: Bilateral lower extremity edema  Neuro: Alert and oriented x2 -intermittently needs reorientation  PPS: 40%   This conversation/these recommendations were discussed with patient primary care team, Dr. Sloan Leiter  Billing based on MDM: High  Problems Addressed: One acute or chronic illness or injury that poses a threat to life or bodily function  Amount and/or Complexity of Data: Category 3:Discussion of management or test interpretation with external physician/other qualified health care professional/appropriate source (not separately reported)  Risks: Decision regarding hospitalization or escalation of hospital care and Decision not to resuscitate or to de-escalate care because of poor prognosis ______________________________________________________ [place] Team Team Cell Phone: [phone] Please utilize secure chat with additional questions, if there is no response within 30 minutes please call the above phone number  Palliative Medicine Team providers are available by phone from 7am to 7pm daily and can be reached through the team cell phone.  Should this patient require assistance outside of these hours, please call the patient's attending physician.

## 2022-10-18 NOTE — Discharge Instructions (Signed)
Information on my medicine - ELIQUIS® (apixaban) ° °This medication education was reviewed with me or my healthcare representative as part of my discharge preparation. ° °Why was Eliquis® prescribed for you? °Eliquis® was prescribed for you to reduce the risk of a blood clot forming that can cause a stroke if you have a medical condition called atrial fibrillation (a type of irregular heartbeat). ° °What do You need to know about Eliquis® ? °Take your Eliquis® TWICE DAILY - one tablet in the morning and one tablet in the evening with or without food. If you have difficulty swallowing the tablet whole please discuss with your pharmacist how to take the medication safely. ° °Take Eliquis® exactly as prescribed by your doctor and DO NOT stop taking Eliquis® without talking to the doctor who prescribed the medication.  Stopping may increase your risk of developing a stroke.  Refill your prescription before you run out. ° °After discharge, you should have regular check-up appointments with your healthcare provider that is prescribing your Eliquis®.  In the future your dose may need to be changed if your kidney function or weight changes by a significant amount or as you get older. ° °What do you do if you miss a dose? °If you miss a dose, take it as soon as you remember on the same day and resume taking twice daily.  Do not take more than one dose of ELIQUIS at the same time to make up a missed dose. ° °Important Safety Information °A possible side effect of Eliquis® is bleeding. You should call your healthcare provider right away if you experience any of the following: °? Bleeding from an injury or your nose that does not stop. °? Unusual colored urine (red or dark [place]) or unusual colored stools (red or black). °? Unusual bruising for unknown reasons. °? A serious fall or if you hit your head (even if there is no bleeding). ° °Some medicines may interact with Eliquis® and might increase your risk of bleeding or  clotting while on Eliquis®. To help avoid this, consult your healthcare provider or pharmacist prior to using any new prescription or non-prescription medications, including herbals, vitamins, non-steroidal anti-inflammatory drugs (NSAIDs) and supplements. ° °This website has more information on Eliquis® (apixaban): http://www.eliquis.com/eliquis/home ° °

## 2022-10-18 NOTE — TOC Progression Note (Signed)
Transition of Care (TOC) - Progression Note    Patient Details  Name: [name]. MRN: [id number] Date of Birth: Dec 30, 1940  Transition of Care [facility]) CM/SW [place], LCSW Phone Number: 10/18/2022, 12:23 PM  Clinical Narrative:    CSW actively working with Melven Sartorius to have out patient HD set up for pt, to dc to SNF. Currently awaiting [facility] auth approval for HD. SNF informed CSW that can transport pt to HD appointments MWF 12:15 at [facility]). TOC will continue to follow.    Expected Discharge Plan: [place] Barriers to Discharge: Continued Medical Work up  Expected Discharge Plan and [place] arrangements for the past 2 months: Apartment Expected Discharge Date: 10/18/22                                     Social Determinants of Health (SDOH) Interventions [place]: No Food Insecurity (10/10/2022)  Housing: Low Risk  (10/10/2022)  Transportation Needs: No Transportation Needs (10/10/2022)  Utilities: Not At Risk (10/10/2022)  Tobacco Use: Medium Risk (10/16/2022)    Readmission Risk Interventions     No data to display         Beckey Rutter, MSW, LCSWA, LCASA Transitions of Care  Clinical Social Worker I

## 2022-10-18 NOTE — Progress Notes (Addendum)
Case discussed with CSW this am. Pt has been accepted at [facility]) MWF 12:15 chair time. Awaiting update from [facility] admissions and [facility] regarding auth for out-pt HD. Contacted Tara Z [phone]),[facility] HD CSW, regarding need for auth and provided clinic details/schedule. CSW confirms that snf can accommodate new clinic and schedule. Update provided to nephrologist and renal NP as well regarding clinic change at d/c and schedule change at d/c. Will await VA auth for out-pt HD at d/c.   Melven Sartorius Renal Navigator [phone]  Addendum at 3:49 pm: [facility] admissions and [place] regarding status of approval. Awaiting response from both.   Addendum at 5:04 pm: VA auth still pending. Pt cannot start at [facility] until [place] is received. Will f/u with [facility] regarding VA auth on Monday am.

## 2022-10-18 NOTE — Progress Notes (Signed)
PT Cancellation Note  Patient Details Name: [name]. MRN: [id number] DOB: October 27, 1940   Cancelled Treatment:    Reason Eval/Treat Not Completed: Patient at procedure or test/unavailable; patient in HD.  Will attempt another day.   [name] 10/18/2022, 3:42 PM Magda Kiel, PT Acute Rehabilitation Services [phone] 10/18/2022

## 2022-10-18 NOTE — Progress Notes (Signed)
[facility] Progress Note   Subjective:    Seen in room. No c/o's today.    Objective:   BP (!) 128/96 (BP Location: Right Arm)   Pulse 99   Temp (!) 97.5 F (36.4 C) (Oral)   Resp 20   Ht '5\' 6"'$  (1.676 m)   Wt 78 kg   SpO2 98%   BMI 27.75 kg/m   Physical Exam: XBM:WUXLKGM [place] O2, very sleepy , won't awaken for conversation CVS: RRR Resp: WOB looks increased to me,  the same as it has looked the last few days Abd: soft Ext: trace LE edema, improved ACCESS: R IJ TDC  Dialysis Orders:  [facility] - per last admission  4hr 400/500   74.5 kg   2/2.5   Heparin 3K bolus - hectorol 12mg    Assessment/Plan: Acute hypoxic respiratory failure/volume overload - sp HD x 5 here w/ total 14 liters total UF. Pt is euvolemic on exam, persistent R effusion by CXR. Has had Needs to limit fluid intake (which he is not doing). This will be 6th HD today, still 2kg over. UFG 2.5 w/ HD today.  ESRD -  HD was TTS, now will be [place]WF [place] to get on mwf schedule.  BP - soft BP's the last 48 hrs in the 90s to low 100s; on metoprolol for aflutter Anemia  - Hb above goal. No ESA needs currently  Metabolic bone disease -  Ca/Phos ok. Continue home meds.  Nutrition - Renal diet with fluid restriction NICM/ HFrEF/ bivent HF- severe LV failure at 10-15% per echo here. Very poor prognosis  Atrial flutter - in and out here, new diagnosis. Getting metoprolol xl, cont.  DNR: pt is now DNR [facility]. Possible dc soon.    RKelly Splinter MD 10/18/2022, 4:05 PM  Recent Labs  Lab 10/12/22 0108 10/12/22 0840 10/15/22 0852 10/15/22 1005 10/17/22 0059 10/18/22 0010  HGB 12.5*  --  12.4*  --   --   --   ALBUMIN  --    < > 3.5   < > 3.4* 3.3*  CALCIUM  --    < > 8.2*   < > 8.2* 8.4*  PHOS  --    < > 4.7*   < > 4.5 3.9  CREATININE  --    < > 5.98*   < > 5.21* 3.88*  K  --    < > 5.3*   < > 4.2 4.1   < > = values in this  interval not displayed.     Inpatient medications:  apixaban  2.5 mg Oral BID   arformoterol  15 mcg Nebulization BID   atorvastatin  40 mg Oral QHS   budesonide (PULMICORT) nebulizer solution  0.25 mg Nebulization BID   Chlorhexidine Gluconate Cloth  6 each Topical Q0600   hydrocerin   Topical TID   ipratropium-albuterol  3 mL Nebulization TID   metoprolol succinate  25 mg Oral Daily   revefenacin  175 mcg Nebulization Daily    albuterol, white petrolatum

## 2022-10-18 NOTE — TOC Benefit Eligibility Note (Signed)
Patient Advocate Encounter  Insurance verification completed.    The patient is currently admitted and upon discharge could be taking Eliquis 5 mg.  The current 30 day co-pay is $0.00.   The patient is insured through [employer] Medicare Part D   Malisa Ruggiero, CPHT Pharmacy Patient Advocate Specialist [facility] Patient Advocate Team Direct Number: [phone]  Fax: [phone]       

## 2022-10-19 DIAGNOSIS — R0603 Acute respiratory distress: Secondary | ICD-10-CM | POA: Diagnosis not present

## 2022-10-19 DIAGNOSIS — Z515 Encounter for palliative care: Secondary | ICD-10-CM | POA: Diagnosis not present

## 2022-10-19 MED ORDER — FUROSEMIDE 10 MG/ML IJ SOLN
60.0000 mg | Freq: Once | INTRAMUSCULAR | Status: AC
  Administered 2022-10-19: 60 mg via INTRAVENOUS
  Filled 2022-10-19: qty 6

## 2022-10-19 NOTE — Progress Notes (Addendum)
   Palliative Medicine Inpatient Follow Up Note   HPI: Patient is a 82 y.o.  male with history of ESRD on HD TTS, HFrEF (EF < 20%), COPD, HTN, HLD who was admitted for shortness of breath. Palliative care has been asked to get involved for further conversation regarding goals of care in the setting of end stage heart failure.    Today's Discussion 10/19/2022  *Please note that this is a verbal dictation therefore any spelling or grammatical errors are due to the "[place]" system interpretation.  Chart reviewed inclusive of vital signs, progress notes, laboratory results, and diagnostic images.   I met with [place] at bedside this morning. He was resting comfortably and feels his breathing is somewhat improved since receiving dialysis yesterday. He vocalizes that he wants to stick with the plan for rehabilitation at this time.   Created space and opportunity for patient to explore thoughts feelings and fears regarding current medical situation. He recognizes that he is sick and at the end stage of his disease though he still has the feeling he will have good days ahead.   Questions and concerns addressed/Palliative Support Provided.  _____________________________ Addendum:  I went by patients room this evening as nursing staff informed me that he was having increased WOB. Upon assessment he denied feeling shortness of breath.   I was honest with [place] that we are concerned about his overall condition. I shared that if he worsens further that is his body's way are telling him it can no longer compensate. I strongly recommended transition to comfort care if this were to occur. He vocalized understanding.  Have tried to call friend, April to discuss the above with her though she was driving therefore unable to speak.   Additional Time: 22  Objective Assessment: Vital Signs Vitals:   10/19/22 0821 10/19/22 0856  BP: 97/69   Pulse: 82   Resp: 20   Temp: 97.6 F (36.4 C)    SpO2: 96% 98%    Intake/Output Summary (Last 24 hours) at 10/19/2022 6213 Last data filed at 10/18/2022 1851 Gross per 24 hour  Intake 120 ml  Output 2500 ml  Net -2380 ml   Last Weight  Most recent update: 10/19/2022  4:11 AM    Weight  72.9 kg (160 lb 11.5 oz)            Gen: Frail elderly African-American male in moderate distress HEENT: moist mucous membranes CV: Regular rate and rhythm PULM: On 4L nasal cannula breathing is slightly labored ABD: soft/nontender  EXT: Bilateral lower extremity edema  Neuro: Alert and oriented x2   SUMMARY OF RECOMMENDATIONS   DNAR/DNI    [place] would like to continue with present scope of care inclusive of dialysis treatments   Plan for transition to [facility] rehabilitation with outpatient palliative support through [employer]   PMT will continue to intermittently follow while inpatient  Billing based on MDM: Moderate ______________________________________________________________________________________ [place] Team Team Cell Phone: [phone] Please utilize secure chat with additional questions, if there is no response within 30 minutes please call the above phone number  Palliative Medicine Team providers are available by phone from 7am to 7pm daily and can be reached through the team cell phone.  Should this patient require assistance outside of these hours, please call the patient's attending physician.

## 2022-10-19 NOTE — Progress Notes (Addendum)
PROGRESS NOTE        PATIENT DETAILS Name: [name]. Age: 82 y.o. Sex: male Date of Birth: 1941-01-27 Admit Date: 10/16/2022 Admitting Physician Kayleen Memos, DO YIR:SWNIOEV, Dibas, MD  Brief Summary: Patient is a 82 y.o.  male with history of ESRD on HD TTS, HFrEF, COPD, HTN, HLD who presented with SOB x 1 day.  Patient was found to have acute hypoxic respiratory failure-requiring BiPAP on initial presentation.  Significant events: 1/16>> SOB-hypoxia-requiring BiPAP.  Last HD on 1/16 as an outpatient. 1/24>>developed worsening SOB-non compliant with Fluid restrictions-PCCM/Cards consult  Significant studies: 1/16>> CXR: Moderate right pleural effusion-right basilar consolidation 1/24>> echo: OJ<50%, RV systolic function severely reduced.  Significant microbiology data: 1/16>> COVID/influenza/RSV PCR: Negative  Procedures: 1/24>> thoracocentesis  Consults: Nephrology PCCM Cardiology  Subjective: Patient in bed, appears comfortable, denies any headache, no fever, no chest pain or pressure, no shortness of breath , no abdominal pain. No focal weakness.  Objective: Vitals: Blood pressure 97/69, pulse 82, temperature 97.6 F (36.4 C), resp. rate 20, height '5\' 6"'$  (1.676 m), weight 72.9 kg, SpO2 96 %.   Exam:  Awake Alert, No new F.N deficits, Normal affect [place].AT,PERRAL Supple Neck, No JVD,   Symmetrical Chest wall movement, Good air movement bilaterally, CTAB RRR,No Gallops, Rubs or new Murmurs,  +ve B.Sounds, Abd Soft, No tenderness,   Trace leg edema      Assessment/Plan: Acute hypoxic respiratory failure-multifactorial etiology-right lobar pneumonia/pleural effusion/COPD exacerbation/volume overload in the setting of ESRD/HFrEF exacerbation Has completed a course of antibiotic/steroids-volume removal with HD and is s/p thoracocentesis on 1/24 without any significant improvement.    Unfortunately appears to have  end-stage/advanced HFrEF-still with dyspnea with minimal activity He still wants to continue with hemodialysis-await palliative care evaluation for goals of care.  He is however a DNR.  Long-term prognosis appears very poor. DC Tele as management will not change, he is end stage CHF - medically terminal and DNR.    Chronic right pleural effusion Reviewed chart-prior notes-appears to have a chronic right pleural effusion-that has been tapped in the past (has undergone T thoracentesis September and November 2023-transudate) this is felt to be due to HFrEF and ESRD Due to persistent SOB in spite of aggressive dialysis-underwent a thoracocentesis on 1/24-without any significant improvement in his dyspnea-he is now thought to have advanced probably end-stage HFrEF at this point.     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 Long discussion with patient-over the past several days he understands his overall tenuous situation-now a DNR-but wants to continue with hemodialysis for now.    New onset a flutter Rate controlled with beta-blocker Started Eliquis by cardiology on 1/24   ESRD on HD TTS Nephrology following Per nephrologist-hard to dialyze-soft BP-will await further recommendations from palliative care team.  If patient elects to stop HD-then he would benefit  from discharge  to [facility].  Long-term prognosis again looks very poor.   Hyponatremia Persistent hyponatremia in the setting of advanced HFrEF-dietary noncompliance to fluid restriction-ESRD Volume removal with HD Emphasize fluid restriction Follow electrolytes periodically   Hypomagnesemia Repleted   HTN BP currently soft in the low 938B systolic Resume losartan when able Continue metoprolol   HLD Statin   Thrombocytopenia Mild Seems to be a chronic issue ongoing for the past several years. Repeat CBC periodically.   Desquamation of skin of bilateral lower extremities eucerin ointment Overall much improved over the past several days.   History of polysubstance abuse (heroin/cocaine/marijuana) Claims he has been clean for approximately 5 years.  However UDS positive for cocaine as of March 2023.     BMI: Estimated body mass index is 27.51 kg/m as calculated from the following:   Height as of this encounter: '5\' 6"'$  (1.676 m).   Weight as of this encounter: 77.3 kg.     Code status:   Code Status: DNR   DVT Prophylaxis: apixaban (ELIQUIS) tablet 2.5 mg Start: 10/17/22 1745 apixaban (ELIQUIS) tablet 2.5 mg   Family Communication: Significant other-[phone] and daughter Otila Kluver White-[phone] on 1/25.  Called both Significant other-[phone] and daughter Otila Kluver White-[phone] - 10/19/22 at 2:35 pm - no response   Disposition Plan: Status is: Inpatient Remains inpatient appropriate because: Severity of illness.   Planned Discharge Destination:SNF when bed available   Diet: Diet Order             Diet renal with fluid restriction Fluid restriction: Other (see comments); Room service appropriate? Yes; Fluid consistency: Thin  Diet effective now           Diet - low sodium heart healthy                    MEDICATIONS: Scheduled Meds:  apixaban  2.5 mg Oral BID   arformoterol  15 mcg Nebulization BID    atorvastatin  40 mg Oral QHS   budesonide (PULMICORT) nebulizer solution  0.25 mg Nebulization BID   Chlorhexidine Gluconate Cloth  6 each Topical Q0600   hydrocerin   Topical TID   ipratropium-albuterol  3 mL Nebulization TID   metoprolol succinate  25 mg Oral Daily   revefenacin  175 mcg Nebulization Daily   Continuous Infusions:   PRN Meds:.albuterol, white petrolatum   I have personally reviewed following labs and imaging studies  LABORATORY DATA:  Recent Labs  Lab 10/15/22 0852  WBC 8.8  HGB 12.4*  HCT 36.3*  PLT 129*  MCV 96.0  MCH 32.8  MCHC 34.2  RDW 14.7    Recent Labs  Lab 10/13/22 0112 10/15/22 0852 10/15/22 1005 10/16/22 1152 10/17/22 0059 10/18/22 0010  NA 129* 119* 126* 128* 126* 128*  K 4.1 5.3* 3.9 4.7 4.2 4.1  CL 93* 84* 89* 90* 90* 88*  CO2 '22 22 26 22 24 26  '$ ANIONGAP '14 13 11 '$ 16* 12 14  GLUCOSE 208* 120* 99 113* 80 86  BUN 34* 63* 35* 53* 59* 37*  CREATININE 3.70* 5.98* 3.78* 4.76* 5.21* 3.88*  ALBUMIN 3.2* 3.5  --  3.6 3.4* 3.3*  MG  --   --   --  1.7  --   --   CALCIUM 8.1* 8.2* 8.2* 8.2* 8.2* 8.4*       LOS: 11 days   Signature  -    Lala Lund M.D on 10/19/2022 at 8:29 AM   -  To page  go to [email or web address]

## 2022-10-19 NOTE — Progress Notes (Signed)
[facility] Progress Note   Subjective:    Seen in room. No c/o's today. Breathing better after HD yesterday.    Objective:   BP (!) 128/96 (BP Location: Right Arm)   Pulse 99   Temp (!) 97.5 F (36.4 C) (Oral)   Resp 20   Ht '5\' 6"'$  (1.676 m)   Wt 78 kg   SpO2 98%   BMI 27.75 kg/m   Physical Exam: CNO:[id number] [place] O2, very sleepy , won't awaken for conversation CVS: RRR Resp: WOB looks increased to me,  the same as it has looked the last few days Abd: soft Ext: trace LE edema, improved ACCESS: R IJ TDC  Dialysis Orders:  MWF SW [facility] (due to SNF placement in [place], was prior at [facility] TTS) 4hr 400/500   74.5 kg   2/2.5   Heparin 3K bolus - hectorol 2mg    Assessment/Plan: Acute hypoxic respiratory failure Volume overload - peak 79kg, nadir 72kg, will try to keep him under 73kg as he does not tolerate vol excess well at all w/ his severe LV failure.  [facility]  [facility] [place] [facility] [place].  BP - soft BP's the last 48 hrs in the 90s to low 100s; on metoprolol for aflutter Anemia  - Hb above goal. No ESA needs currently  Metabolic bone disease -  Ca/Phos ok. Continue home meds.  Nutrition - Renal diet with fluid restriction NICM/ HFrEF/ bivent HF- severe LV failure at 10-15% per echo here.  GOC - seen by palliative, pt is DNR and wants to continue HD for now.  Atrial flutter - in and out here, new diagnosis. Getting metoprolol xl, cont.  DNR: pt is now DNR [facility] Awaiting [facility]/ [facility] approval.    RKelly Splinter MD 10/19/2022, 9:53 AM  Recent Labs  Lab 10/15/22 0852 10/15/22 1005 10/17/22 0059 10/18/22 0010  HGB 12.4*  --   --   --   ALBUMIN 3.5   < > 3.4* 3.3*  CALCIUM 8.2*   < > 8.2* 8.4*  PHOS 4.7*   < > 4.5 3.9  CREATININE 5.98*   < > 5.21* 3.88*  K 5.3*   < > 4.2 4.1   < > = values in this interval not displayed.     Inpatient medications:  apixaban   2.5 mg Oral BID   arformoterol  15 mcg Nebulization BID   atorvastatin  40 mg Oral QHS   budesonide (PULMICORT) nebulizer solution  0.25 mg Nebulization BID   Chlorhexidine Gluconate Cloth  6 each Topical Q0600   hydrocerin   Topical TID   ipratropium-albuterol  3 mL Nebulization TID   metoprolol succinate  25 mg Oral Daily   revefenacin  175 mcg Nebulization Daily    albuterol, white petrolatum

## 2022-10-19 NOTE — Progress Notes (Addendum)
Pt placed on Bipap due to increased WOB, and accessory muscle use. RN made aware. Pt tolerating well at this time.

## 2022-10-19 NOTE — Progress Notes (Signed)
Approximately 1350-- This RN received call from telemetry. Pt had 9 beat run of VT. Upon assessment, pt has increased work of breathing with expiratory wheezes. Denies feeling short of breath of chest pain. Giving scheduled nebulizer treatment now. MD Candiss Norse notified. Per MD, verbal order received for x 1 dose of '60mg'$  lasix IV. RN to administer and continue to monitor pt.

## 2022-10-20 DIAGNOSIS — Z7189 Other specified counseling: Secondary | ICD-10-CM | POA: Diagnosis not present

## 2022-10-20 DIAGNOSIS — R0603 Acute respiratory distress: Secondary | ICD-10-CM | POA: Diagnosis not present

## 2022-10-20 DIAGNOSIS — Z515 Encounter for palliative care: Secondary | ICD-10-CM | POA: Diagnosis not present

## 2022-10-20 MED ORDER — GLYCOPYRROLATE 1 MG PO TABS: 1.0000 mg | ORAL_TABLET | ORAL | Status: DC | PRN

## 2022-10-20 MED ORDER — GLYCOPYRROLATE 0.2 MG/ML IJ SOLN: 0.2000 mg | INTRAMUSCULAR | Status: DC | PRN

## 2022-10-20 MED ORDER — ACETAMINOPHEN 325 MG PO TABS: 650.0000 mg | ORAL_TABLET | Freq: Four times a day (QID) | ORAL | Status: DC | PRN

## 2022-10-20 MED ORDER — HYDROMORPHONE HCL-NACL 50-0.9 MG/50ML-% IV SOLN
0.5000 mg/h | INTRAVENOUS | Status: DC
  Administered 2022-10-20: 0.5 mg/h via INTRAVENOUS
  Filled 2022-10-20 (×3): qty 50

## 2022-10-20 MED ORDER — ACETAMINOPHEN 650 MG RE SUPP: 650.0000 mg | Freq: Four times a day (QID) | RECTAL | Status: DC | PRN

## 2022-10-20 MED ORDER — ONDANSETRON HCL 4 MG/2ML IJ SOLN: 4.0000 mg | Freq: Four times a day (QID) | INTRAMUSCULAR | Status: DC | PRN

## 2022-10-20 MED ORDER — BIOTENE DRY MOUTH MT LIQD: 15.0000 mL | OROMUCOSAL | Status: DC | PRN

## 2022-10-20 MED ORDER — FUROSEMIDE 10 MG/ML IJ SOLN
80.0000 mg | Freq: Once | INTRAMUSCULAR | Status: AC
  Administered 2022-10-20: 80 mg via INTRAVENOUS
  Filled 2022-10-20: qty 8

## 2022-10-20 MED ORDER — ONDANSETRON 4 MG PO TBDP: 4.0000 mg | ORAL_TABLET | Freq: Four times a day (QID) | ORAL | Status: DC | PRN

## 2022-10-20 MED ORDER — POLYVINYL ALCOHOL 1.4 % OP SOLN: 1.0000 [drp] | Freq: Four times a day (QID) | OPHTHALMIC | Status: DC | PRN

## 2022-10-20 MED ORDER — HYDROMORPHONE BOLUS VIA INFUSION
0.5000 mg | INTRAVENOUS | Status: DC | PRN
  Administered 2022-10-20: 0.5 mg via INTRAVENOUS

## 2022-10-20 NOTE — Plan of Care (Signed)

## 2022-10-20 NOTE — Progress Notes (Signed)
[facility] Progress Note   Subjective:    Seen in room. Had resp distress again yesterday after and required bipap overnight.  Is on [place] O2 this afternoon    Objective:   BP (!) 128/96 (BP Location: Right Arm)   Pulse 99   Temp (!) 97.5 F (36.4 C) (Oral)   Resp 20   Ht '5\' 6"'$  (1.676 m)   Wt 78 kg   SpO2 98%   BMI 27.75 kg/m   Physical Exam: Gen: actively SOB w/ increased WOB CVS: RRR Resp: as above, no wheezing, +basilar rales mild Abd: soft Ext: trace LE edema, improved ACCESS: R IJ TDC  Dialysis Orders:  MWF SW [facility] (due to SNF placement in [place], was prior at [facility] TTS) 4hr 400/500   74.5 kg   2/2.5   Heparin 3K bolus - hectorol 7mg    Assessment/Plan: Acute hypoxic respiratory failure/ severe HFrEF/ LVEF 10%  - due to recurrent pulm edema in pt w/ failed LV w/ EF 10%. Pt cannot go more than 36 hrs w/o experiencing resp distress. This is a reflection of progression of his end-stage heart failure. Continued HD cannot help him, he is actively dying and we will have a meeting w/ the family today to talk about transition to comfort care.  ESRD -  TTS HD at [facility] BP - soft BP's in general  DNR - palliative actively involved Atrial flutter - on metoprolol xl DNR  RKelly Splinter MD 10/20/2022, 3:28 PM  Recent Labs  Lab 10/15/22 0852 10/15/22 1005 10/17/22 0059 10/18/22 0010  HGB 12.4*  --   --   --   ALBUMIN 3.5   < > 3.4* 3.3*  CALCIUM 8.2*   < > 8.2* 8.4*  PHOS 4.7*   < > 4.5 3.9  CREATININE 5.98*   < > 5.21* 3.88*  K 5.3*   < > 4.2 4.1   < > = values in this interval not displayed.     Inpatient medications:  arformoterol  15 mcg Nebulization BID   budesonide (PULMICORT) nebulizer solution  0.25 mg Nebulization BID   ipratropium-albuterol  3 mL Nebulization TID    HYDROmorphone     acetaminophen **OR** acetaminophen, albuterol, antiseptic oral rinse, glycopyrrolate **OR** glycopyrrolate **OR** glycopyrrolate, HYDROmorphone,  ondansetron **OR** ondansetron (ZOFRAN) IV, polyvinyl alcohol, white petrolatum

## 2022-10-20 NOTE — Progress Notes (Addendum)
Palliative Medicine Inpatient Follow Up Note HPI: Patient is a 82 y.o.  male with history of ESRD on HD TTS, HFrEF (EF < 20%), COPD, HTN, HLD who was admitted for shortness of breath. Palliative care has been asked to get involved for further conversation regarding goals of care in the setting of end stage heart failure.    Today's Discussion 10/20/2022  *Please note that this is a verbal dictation therefore any spelling or grammatical errors are due to the "[employer]" system interpretation.  Chart reviewed inclusive of vital signs, progress notes, laboratory results, and diagnostic images.   I met this afternoon with [facility]. He is resting in bed breathing heavily. He vocalizes a lack of distress this afternoon.  I shared the plan to speak with his daughter in the presence of Dr. Jonnie Finner due to concern his body can no longer tolerate dialysis.   I was joined by Dr. Jonnie Finner this afternoon at bedside with [facility]. We were able to call his daughter, Otila Kluver on speaker-phone. Dr. Jonnie Finner reviewed [name] chronic disease burden inclusive of his severe heart failure, ESRD, & COPD. Jazen vocalized awareness of his very poorly functioning heart.   Dr. Jonnie Finner was able to review the dialysis treatments that he has received in house and how he has developed respiratory distress in a short period of time thereafter. He shares that at this time patients present health-state is not sustainable.   Dr. Jonnie Finner shared that at this time Clete is too sick with disease so severe that continuing dialysis will not help him any longer. Reviewed that his body has become too sick to endure this treatment.  Discussed the idea of keeping [place] comfortable and reviewed would that would entail. We discussed his symptom burden and the idea of starting a low dose dilaudid gtt to support this. I shared that patients time is anticipated to be short though the goal will be to maintain comfort during this time.    Both Kysean and his daughter are in agreement with the transition of care to comfort measures.  I was able to call patients girlfriend after leaving the room and update her on patients condition. She plans to come in to spend time with him.   Objective Assessment: Vital Signs Vitals:   10/20/22 1300 10/20/22 1513  BP: 110/77 108/79  Pulse: 81 83  Resp: (!) 22 (!) 22  Temp: 98 F (36.7 C) 97.6 F (36.4 C)  SpO2: 98% 100%    Intake/Output Summary (Last 24 hours) at 10/20/2022 1523 Last data filed at 10/20/2022 0900 Gross per 24 hour  Intake 700 ml  Output --  Net 700 ml    Last Weight  Most recent update: 10/20/2022  3:51 AM    Weight  74.9 kg (165 lb 2 oz)            Gen: Frail elderly African-American male in moderate distress HEENT: moist mucous membranes CV: Regular rate and rhythm PULM: On 4L nasal cannula breathing is slightly labored ABD: soft/nontender  EXT: Bilateral lower extremity edema  Neuro: Alert and oriented x3  SUMMARY OF RECOMMENDATIONS   DNAR/DNI    Transition to comfort care  Plan to start low dose dilaudid gtt  Additional comfort medications per [facility]  Anticipate in hospital passing   PMT will continue to follow  Total Time: 52 Billing based on MDM: High ______________________________________________________________________________________ [place] Team Team Cell Phone: [phone] Please utilize secure chat with additional questions, if there  is no response within 30 minutes please call the above phone number  Palliative Medicine Team providers are available by phone from 7am to 7pm daily and can be reached through the team cell phone.  Should this patient require assistance outside of these hours, please call the patient's attending physician.

## 2022-10-20 NOTE — Progress Notes (Signed)
Pt is now on comfort care, no further dialysis. Will sign off.   Kelly Splinter, MD 10/20/2022, 7:19 PM

## 2022-10-20 NOTE — Progress Notes (Addendum)
PROGRESS NOTE        PATIENT DETAILS Name: [name]. Age: 82 y.o. Sex: male Date of Birth: April 26, 1941 Admit Date: 10/12/2022 Admitting Physician Kayleen Memos, DO HYI:[id number], Dibas, MD  Brief Summary: Patient is a 82 y.o.  male with history of ESRD on HD TTS, HFrEF, COPD, HTN, HLD who presented with SOB x 1 day.  Patient was found to have acute hypoxic respiratory failure-requiring BiPAP on initial presentation.  Significant events: 1/16>> SOB-hypoxia-requiring BiPAP.  Last HD on 1/16 as an outpatient. 1/24>>developed worsening SOB-non compliant with Fluid restrictions-PCCM/Cards consult  Significant studies: 1/16>> CXR: Moderate right pleural effusion-right basilar consolidation 1/24>> echo: OI<78%, RV systolic function severely reduced.  Significant microbiology data: 1/16>> COVID/influenza/RSV PCR: Negative  Procedures: 1/24>> thoracocentesis  Consults: Nephrology PCCM Cardiology  Subjective: Patient in bed currently comfortable denies any headache or chest pain, comfortable on BiPAP, no abdominal pain or focal weakness  Objective: Vitals: Blood pressure 101/78, pulse 82, temperature 98 F (36.7 C), temperature source Oral, resp. rate (!) 22, height '5\' 6"'$  (1.676 m), weight 74.9 kg, SpO2 100 %.   Exam:  Awake Alert, No new F.N deficits, wearing BiPAP [place].AT,PERRAL Supple Neck, No JVD,   Symmetrical Chest wall movement, Good air movement bilaterally, positive crackles RRR,No Gallops, Rubs or new Murmurs,  +ve B.Sounds, Abd Soft, No tenderness,   1+ leg edema   Assessment/Plan: Acute hypoxic respiratory failure-multifactorial etiology-right lobar pneumonia/pleural effusion/COPD exacerbation/volume overload in the setting of ESRD/HFrEF exacerbation Has completed a course of antibiotic/steroids-volume removal with HD and is s/p thoracocentesis on 1/24 without any significant improvement.    Unfortunately appears to have  end-stage/advanced HFrEF-still with dyspnea with minimal activity, using BiPAP as needed multiple times a day for hypoxia, nephrology dialyzing him almost every other day, intermittent Lasix to get rid of extra fluid unfortunately due to extremely poor LV function he is now terminal. He still wants to continue with hemodialysis-narrative care following and recommending transition to comfort measures but for now patient wants to continue present line of care but remains DNR and no escalation of care.  Long-term prognosis appears very poor. DC Tele as management will not change, he is end stage CHF - medically terminal and DNR.    Chronic right pleural effusion Reviewed chart-prior notes-appears to have a chronic right pleural effusion-that has been tapped in the past (has undergone T thoracentesis September and November 2023-transudate) this is felt to be due to HFrEF and ESRD Due to persistent SOB in spite of aggressive dialysis-underwent a thoracocentesis on 1/24-without any significant improvement in his dyspnea-he is now thought to have advanced probably end-stage HFrEF at this point.      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 Long discussion with  patient-over the past  several days he understands his overall tenuous situation-now a DNR-but wants to continue with hemodialysis for now.  Discussed again on 10/20/2022. Now Comfort care.    New onset a flutter Rate controlled with beta-blocker Started Eliquis by cardiology on 1/24   ESRD on HD TTS Nephrology following Per nephrologist-hard to dialyze-soft BP-will await further recommendations from palliative care team.  If patient elects to stop HD-then he would benefit from discharge to residential hospice at [facility].  Long-term prognosis again looks very poor.   Hyponatremia Persistent hyponatremia in the setting of advanced HFrEF-dietary noncompliance to fluid restriction-ESRD Volume removal with HD Emphasize fluid restriction Follow electrolytes periodically   Hypomagnesemia Repleted   HTN BP currently soft in the low 097D systolic Resume losartan when able Continue metoprolol   HLD Statin   Thrombocytopenia Mild Seems to be a chronic issue ongoing for the past several years. Repeat CBC periodically.   Desquamation of skin of bilateral lower extremities eucerin ointment Overall much improved over the past several days.   History of polysubstance abuse (heroin/cocaine/marijuana) Claims he has been clean for approximately 5 years.  However UDS positive for cocaine as of March 2023.     BMI: Estimated body mass index is 27.51 kg/m as calculated from the following:   Height as of this encounter: '5\' 6"'$  (1.676 m).   Weight as of this encounter: 77.3 kg.     Code status:   Code Status: DNR   DVT Prophylaxis: apixaban (ELIQUIS) tablet 2.5 mg Start: 10/17/22 1745 apixaban (ELIQUIS) tablet 2.5 mg   Family Communication:   Significant other-[phone] and daughter Otila Kluver White-[phone] on 1/25.  Called both Significant other-[phone] and daughter Otila Kluver [phone] - 10/19/22 at 2:35 pm - no response Daughter Otila Kluver [phone] - 10/20/22  >> comfort care   Disposition Plan: Status is: Inpatient Remains inpatient appropriate because: Severity of illness.   Planned Discharge Destination:SNF when bed available   Diet: Diet Order             Diet renal with fluid restriction Fluid restriction: Other (see comments); Room service appropriate? Yes; Fluid consistency: Thin  Diet effective now           Diet - low sodium heart healthy                    MEDICATIONS: Scheduled Meds:  apixaban  2.5 mg Oral BID   arformoterol  15 mcg Nebulization BID   atorvastatin  40 mg Oral QHS   budesonide (PULMICORT) nebulizer solution  0.25 mg Nebulization BID   Chlorhexidine Gluconate Cloth  6 each Topical Q0600   furosemide  80 mg Intravenous Once   hydrocerin   Topical TID   ipratropium-albuterol  3 mL Nebulization TID   metoprolol succinate  25 mg Oral Daily   revefenacin  175 mcg Nebulization Daily   Continuous Infusions:   PRN Meds:.albuterol, white petrolatum   I have personally reviewed following labs and imaging studies  LABORATORY DATA:  Recent Labs  Lab 10/15/22 0852  WBC 8.8  HGB 12.4*  HCT 36.3*  PLT 129*  MCV 96.0  MCH 32.8  MCHC 34.2  RDW 14.7    Recent Labs  Lab 10/15/22 0852 10/15/22 1005 10/16/22 1152 10/17/22 0059 10/18/22 0010  NA 119* 126* 128* 126* 128*  K 5.3* 3.9 4.7 4.2 4.1  CL 84* 89* 90* 90* 88*  CO2 '22 26 22 24 26  '$ ANIONGAP 13 11 16* 12 14  GLUCOSE 120* 99 113* 80  86  BUN 63* 35* 53* 59* 37*  CREATININE 5.98* 3.78* 4.76* 5.21* 3.88*  ALBUMIN 3.5  --  3.6 3.4* 3.3*  MG  --   --  1.7  --   --   CALCIUM 8.2* 8.2* 8.2* 8.2* 8.4*       LOS: 12 days   Signature  -    Lala Lund M.D on 10/20/2022 at 8:53 AM   -  To page go to [email or web address]

## 2022-10-21 ENCOUNTER — Ambulatory Visit (HOSPITAL_COMMUNITY): Payer: No Typology Code available for payment source

## 2022-10-21 DIAGNOSIS — R0603 Acute respiratory distress: Secondary | ICD-10-CM | POA: Diagnosis not present

## 2022-10-24 NOTE — Plan of Care (Signed)
  Problem: Pain Management: Goal: Satisfaction with pain management regimen will improve Outcome: Completed/Met

## 2022-10-24 NOTE — Death Summary Note (Signed)
[facility] Death Note                                                                                                                                                                                               [name], is a 82 y.o. male, DOB - [birth year], PPJ:[id number]  Admit date - 10/06/2022   Admitting Physician Kayleen Memos, DO  Outpatient Primary MD for the patient is Koirala, Dibas, MD  LOS - 31  Chief Complaint  Patient presents with   Shortness of Breath       Notification: [name], MD notified of death of Oct 26, 2022   Date and Time of Death - October 26, 2022, 7.08 am  Pronounced by - RN  History of present illness:   Bernard Perrow. is a 82 y.o. male with a history of - 82 y.o.  male with history of ESRD on HD TTS, HFrEF with EF around 20%, COPD, HTN, HLD who presented with SOB x 1 day.  Patient was found to have acute hypoxic respiratory failure-requiring BiPAP on initial presentation.  He was seen by cardiology, nephrology and palliative care, despite maximal treatment including ongoing HD treatments his CHF continue to be quite severe and he was finally transition to comfort measures after discussions with patient and daughter on 10/25/2022, he passed away in comfort on Oct 26, 2022.   Final Diagnoses:  Cause if death - acute on chronic systolic heart failure  Signature  -    Lala Lund M.D on Oct 26, 2022 at 7:37 AM   -  To page go to [email or web address]   Total clinical and documentation time for today Under 30 minutes   Last Note                            PROGRESS NOTE        PATIENT DETAILS Name: [name]. Age: 82 y.o. Sex: male Date of Birth: 1940-12-06 Admit Date: 10/14/2022 Admitting Physician Kayleen Memos, DO PYK:[email or web address], Dibas, MD  Brief Summary: Patient is a 82 y.o.  male with history of ESRD on HD TTS, HFrEF, COPD, HTN, HLD who presented with  SOB x 1 day.  Patient was found to have acute hypoxic respiratory failure-requiring BiPAP on initial presentation.  Significant events: 1/16>> SOB-hypoxia-requiring BiPAP.  Last HD on 1/16 as an outpatient. 1/24>>developed worsening SOB-non compliant with Fluid restrictions-PCCM/Cards consult  Significant studies: 1/16>> CXR: Moderate right pleural effusion-right basilar consolidation 1/24>> echo: NK<53%, RV systolic function severely reduced.  Significant microbiology data: 1/16>> COVID/influenza/RSV PCR: Negative  Procedures: 1/24>> thoracocentesis  Consults: Nephrology PCCM Cardiology  Subjective: Patient in bed currently comfortable denies any headache or chest pain, comfortable on BiPAP, no abdominal pain or focal weakness  Objective: Vitals: Blood pressure 116/89, pulse 95, temperature (!) 97.4 F (36.3 C), temperature source Oral, resp. rate 19, height '5\' 6"'$  (1.676 m), weight 74.9 kg, SpO2 96 %.   Exam:  Awake Alert, No new F.N deficits, wearing BiPAP [place].AT,PERRAL Supple Neck, No JVD,   Symmetrical Chest wall movement, Good air movement bilaterally, positive crackles RRR,No Gallops, Rubs or new Murmurs,  +ve B.Sounds, Abd Soft, No tenderness,   1+ leg edema   Assessment/Plan: Acute hypoxic respiratory failure-multifactorial etiology-right lobar pneumonia/pleural effusion/COPD exacerbation/volume overload in the setting of ESRD/HFrEF exacerbation Has completed a course of antibiotic/steroids-volume removal with HD and is s/p thoracocentesis on 1/24 without any significant improvement.    Unfortunately appears to have end-stage/advanced HFrEF-still with dyspnea with minimal activity, using BiPAP as needed multiple times a day for hypoxia, nephrology dialyzing him almost every other day, intermittent Lasix to get rid of extra fluid unfortunately due to extremely poor LV function he is now terminal. He still wants to continue with hemodialysis-narrative care following  and recommending transition to comfort measures , DNR and no escalation of care >> Comfort care .  Long-term prognosis appears very poor. DC Tele as management will not change, he is end stage CHF - medically terminal and DNR.    Chronic right pleural effusion Reviewed chart-prior notes-appears to have a chronic right pleural effusion-that has been tapped in the past (has undergone T thoracentesis September and November 2023-transudate) this is felt to be due to HFrEF and ESRD Due to persistent SOB in spite of aggressive dialysis-underwent a thoracocentesis on 1/24-without any significant improvement in his dyspnea-he is now thought to have advanced probably end-stage HFrEF at this point.      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 Long discussion with patient-over the past several days he understands his overall tenuous situation-now a DNR-but wants to continue with hemodialysis for now.  Discussed again on 10/20/2022. Now Comfort care.    New onset a flutter Rate controlled with beta-blocker Started Eliquis by cardiology on 1/24   ESRD on HD TTS Nephrology following Per nephrologist-hard to dialyze-soft BP-will await further recommendations  from palliative care team.  If patient elects to stop HD-then he would benefit from discharge to residential hospice at [facility].  Long-term prognosis again looks very poor.   Hyponatremia Persistent hyponatremia in the setting of advanced HFrEF-dietary noncompliance to fluid restriction-ESRD Volume removal with HD Emphasize fluid restriction Follow electrolytes periodically   Hypomagnesemia Repleted   HTN BP currently soft in the low 629B systolic Resume losartan when able Continue metoprolol   HLD Statin   Thrombocytopenia Mild Seems to be a chronic issue ongoing for the past several years. Repeat CBC periodically.   Desquamation of skin of bilateral lower extremities  eucerin ointment Overall much improved over the past several days.   History of polysubstance abuse (heroin/cocaine/marijuana) Claims he has been clean for approximately 5 years.  However UDS positive for cocaine as of March 2023.     BMI: Estimated body mass index is 27.51 kg/m as calculated from the following:   Height as of this encounter: '5\' 6"'$  (1.676 m).   Weight as of this encounter: 77.3 kg.     Code status:   Code Status: DNR   DVT Prophylaxis:    Family Communication:   Significant other-[phone] and daughter Otila Kluver [phone] on 1/25.  Called both Significant other-[phone] and daughter Otila Kluver [phone] - 10/19/22 at 2:35 pm - no response Daughter Otila Kluver [phone] - 10/20/22 >> comfort care   Disposition Plan: Status is: Inpatient Remains inpatient appropriate because: Severity of illness.   Planned Discharge Destination:SNF when bed available   Diet: Diet Order             Diet renal with fluid restriction Fluid restriction: Other (see comments); Room service appropriate? Yes; Fluid consistency: Thin  Diet effective now           Diet - low sodium heart healthy                    MEDICATIONS: Scheduled Meds:   Continuous  Infusions:  HYDROmorphone Stopped (10/20/22 1754)    PRN Meds:.acetaminophen **OR** acetaminophen, albuterol, antiseptic oral rinse, glycopyrrolate **OR** glycopyrrolate **OR** glycopyrrolate, HYDROmorphone, ondansetron **OR** ondansetron (ZOFRAN) IV, polyvinyl alcohol, white petrolatum   I have personally reviewed following labs and imaging studies  LABORATORY DATA:  Recent Labs  Lab 10/15/22 0852  WBC 8.8  HGB 12.4*  HCT 36.3*  PLT 129*  MCV 96.0  MCH 32.8  MCHC 34.2  RDW 14.7    Recent Labs  Lab 10/15/22 0852 10/15/22 1005 10/16/22 1152 10/17/22 0059 10/18/22 0010  NA 119* 126* 128* 126* 128*  K 5.3* 3.9 4.7 4.2 4.1  CL 84* 89* 90* 90* 88*  CO2 '22 26 22 24 26  '$ ANIONGAP 13 11 16* 12 14  GLUCOSE 120* 99 113* 80 86  BUN 63* 35* 53* 59* 37*  CREATININE 5.98* 3.78* 4.76* 5.21* 3.88*  ALBUMIN 3.5  --  3.6 3.4* 3.3*  MG  --   --  1.7  --   --   CALCIUM 8.2* 8.2* 8.2* 8.2* 8.4*       LOS: 13 days   Signature  -    Lala Lund M.D on 2022/10/28 at 7:37 AM   -  To page go to [email or web address]

## 2022-10-24 NOTE — Final Progress Note (Cosign Needed)
50 cc of hydromorphone (Dilaudid) 50 mg in 50 ml NS (1 mg/1 ml) premix infusion was wasted with CN Renae Fickle, RN.

## 2022-10-24 NOTE — Progress Notes (Signed)
Chart reviewed. Out-pt HD referral cancelled.   Melven Sartorius Renal Navigator [phone]

## 2022-10-24 DEATH — deceased
